# Patient Record
Sex: Female | Born: 1955
Health system: Southern US, Community
[De-identification: ages and names within clinical notes are randomized; demographics above are authoritative.]

## PROBLEM LIST (undated history)

## (undated) DIAGNOSIS — I10 Essential (primary) hypertension: Secondary | ICD-10-CM

## (undated) DIAGNOSIS — I209 Angina pectoris, unspecified: Secondary | ICD-10-CM

## (undated) DIAGNOSIS — R0600 Dyspnea, unspecified: Secondary | ICD-10-CM

## (undated) DIAGNOSIS — Z87442 Personal history of urinary calculi: Secondary | ICD-10-CM

## (undated) DIAGNOSIS — K219 Gastro-esophageal reflux disease without esophagitis: Secondary | ICD-10-CM

## (undated) DIAGNOSIS — J189 Pneumonia, unspecified organism: Secondary | ICD-10-CM

## (undated) DIAGNOSIS — R51 Headache: Secondary | ICD-10-CM

## (undated) DIAGNOSIS — E782 Mixed hyperlipidemia: Secondary | ICD-10-CM

## (undated) DIAGNOSIS — R0609 Other forms of dyspnea: Secondary | ICD-10-CM

## (undated) DIAGNOSIS — E119 Type 2 diabetes mellitus without complications: Secondary | ICD-10-CM

## (undated) DIAGNOSIS — I2510 Atherosclerotic heart disease of native coronary artery without angina pectoris: Secondary | ICD-10-CM

## (undated) DIAGNOSIS — R519 Headache, unspecified: Secondary | ICD-10-CM

## (undated) DIAGNOSIS — N200 Calculus of kidney: Secondary | ICD-10-CM

## (undated) DIAGNOSIS — E669 Obesity, unspecified: Secondary | ICD-10-CM

## (undated) HISTORY — PX: CARDIAC CATHETERIZATION: SHX172

---

## 1978-12-10 HISTORY — PX: TUBAL LIGATION: SHX77

## 1989-08-10 HISTORY — PX: LITHOTRIPSY: SUR834

## 2005-09-04 ENCOUNTER — Ambulatory Visit: Payer: Self-pay | Admitting: Family Medicine

## 2005-12-10 DIAGNOSIS — E119 Type 2 diabetes mellitus without complications: Secondary | ICD-10-CM

## 2005-12-10 HISTORY — DX: Type 2 diabetes mellitus without complications: E11.9

## 2006-07-23 ENCOUNTER — Ambulatory Visit: Payer: Self-pay | Admitting: Family Medicine

## 2006-09-03 ENCOUNTER — Ambulatory Visit: Payer: Self-pay | Admitting: Family Medicine

## 2006-11-11 ENCOUNTER — Ambulatory Visit: Payer: Self-pay | Admitting: Family Medicine

## 2007-03-05 ENCOUNTER — Ambulatory Visit: Payer: Self-pay | Admitting: Family Medicine

## 2010-12-10 DIAGNOSIS — J189 Pneumonia, unspecified organism: Secondary | ICD-10-CM

## 2010-12-10 HISTORY — PX: SKIN BIOPSY: SHX1

## 2010-12-10 HISTORY — DX: Pneumonia, unspecified organism: J18.9

## 2012-08-10 HISTORY — PX: CORONARY ANGIOPLASTY WITH STENT PLACEMENT: SHX49

## 2012-08-13 ENCOUNTER — Encounter (HOSPITAL_COMMUNITY): Payer: Self-pay

## 2012-08-13 ENCOUNTER — Ambulatory Visit (HOSPITAL_COMMUNITY)
Admission: EM | Admit: 2012-08-13 | Discharge: 2012-08-15 | Disposition: A | Discharge status: Home / Self Care | Payer: BC Managed Care – PPO | Attending: Internal Medicine | Admitting: Internal Medicine

## 2012-08-13 ENCOUNTER — Emergency Department (HOSPITAL_COMMUNITY): Payer: BC Managed Care – PPO

## 2012-08-13 DIAGNOSIS — E785 Hyperlipidemia, unspecified: Secondary | ICD-10-CM | POA: Insufficient documentation

## 2012-08-13 DIAGNOSIS — E1165 Type 2 diabetes mellitus with hyperglycemia: Secondary | ICD-10-CM | POA: Diagnosis present

## 2012-08-13 DIAGNOSIS — I152 Hypertension secondary to endocrine disorders: Secondary | ICD-10-CM | POA: Diagnosis present

## 2012-08-13 DIAGNOSIS — E781 Pure hyperglyceridemia: Secondary | ICD-10-CM

## 2012-08-13 DIAGNOSIS — E119 Type 2 diabetes mellitus without complications: Secondary | ICD-10-CM | POA: Diagnosis present

## 2012-08-13 DIAGNOSIS — E782 Mixed hyperlipidemia: Secondary | ICD-10-CM

## 2012-08-13 DIAGNOSIS — M7989 Other specified soft tissue disorders: Secondary | ICD-10-CM | POA: Insufficient documentation

## 2012-08-13 DIAGNOSIS — N1831 Chronic kidney disease, stage 3a: Secondary | ICD-10-CM | POA: Diagnosis present

## 2012-08-13 DIAGNOSIS — E876 Hypokalemia: Secondary | ICD-10-CM | POA: Insufficient documentation

## 2012-08-13 DIAGNOSIS — E1169 Type 2 diabetes mellitus with other specified complication: Secondary | ICD-10-CM | POA: Diagnosis present

## 2012-08-13 DIAGNOSIS — R079 Chest pain, unspecified: Secondary | ICD-10-CM | POA: Diagnosis present

## 2012-08-13 DIAGNOSIS — Z955 Presence of coronary angioplasty implant and graft: Secondary | ICD-10-CM

## 2012-08-13 DIAGNOSIS — Z6836 Body mass index (BMI) 36.0-36.9, adult: Secondary | ICD-10-CM | POA: Insufficient documentation

## 2012-08-13 DIAGNOSIS — I10 Essential (primary) hypertension: Secondary | ICD-10-CM

## 2012-08-13 DIAGNOSIS — I2510 Atherosclerotic heart disease of native coronary artery without angina pectoris: Principal | ICD-10-CM | POA: Insufficient documentation

## 2012-08-13 DIAGNOSIS — I509 Heart failure, unspecified: Secondary | ICD-10-CM | POA: Insufficient documentation

## 2012-08-13 DIAGNOSIS — IMO0002 Reserved for concepts with insufficient information to code with codable children: Secondary | ICD-10-CM

## 2012-08-13 DIAGNOSIS — Z23 Encounter for immunization: Secondary | ICD-10-CM | POA: Insufficient documentation

## 2012-08-13 DIAGNOSIS — E1122 Type 2 diabetes mellitus with diabetic chronic kidney disease: Secondary | ICD-10-CM | POA: Diagnosis present

## 2012-08-13 DIAGNOSIS — I5031 Acute diastolic (congestive) heart failure: Secondary | ICD-10-CM

## 2012-08-13 DIAGNOSIS — I200 Unstable angina: Secondary | ICD-10-CM

## 2012-08-13 HISTORY — DX: Mixed hyperlipidemia: E78.2

## 2012-08-13 HISTORY — DX: Essential (primary) hypertension: I10

## 2012-08-13 HISTORY — DX: Obesity, unspecified: E66.9

## 2012-08-13 LAB — COMPREHENSIVE METABOLIC PANEL
Alkaline Phosphatase: 81 U/L (ref 39–117)
BUN: 13 mg/dL (ref 6–23)
CO2: 28 mEq/L (ref 19–32)
Chloride: 98 mEq/L (ref 96–112)
GFR calc Af Amer: >90 mL/min (ref >90)
Glucose, Bld: 199 mg/dL — ABNORMAL HIGH (ref 70–99)
Potassium: 3.5 mEq/L (ref 3.5–5.1)
Total Bilirubin: 0.3 mg/dL (ref 0.3–1.2)

## 2012-08-13 LAB — POCT I-STAT TROPONIN I: Troponin i, poc: 0 ng/mL (ref 0.00–0.08)

## 2012-08-13 LAB — TROPONIN I: Troponin I: <0.3 ng/mL (ref <0.30)

## 2012-08-13 LAB — CBC WITH DIFFERENTIAL/PLATELET
Hemoglobin: 14.4 g/dL (ref 12.0–15.0)
Lymphs Abs: 2.7 10*3/uL (ref 0.7–4.0)
MCH: 31.6 pg (ref 26.0–34.0)
Monocytes Relative: 8 % (ref 3–12)
Neutro Abs: 4.8 10*3/uL (ref 1.7–7.7)
Neutrophils Relative %: 56 % (ref 43–77)
RBC: 4.55 MIL/uL (ref 3.87–5.11)

## 2012-08-13 LAB — PROTIME-INR: Prothrombin Time: 12.2 seconds (ref 11.6–15.2)

## 2012-08-13 IMAGING — CR DG CHEST 2V
2 series · 2 of 2 positions shown · non-contrast
Comparison: None.

CLINICAL DATA: Chest pain, respiratory distress

CHEST - 2 VIEW

[w chest pa]
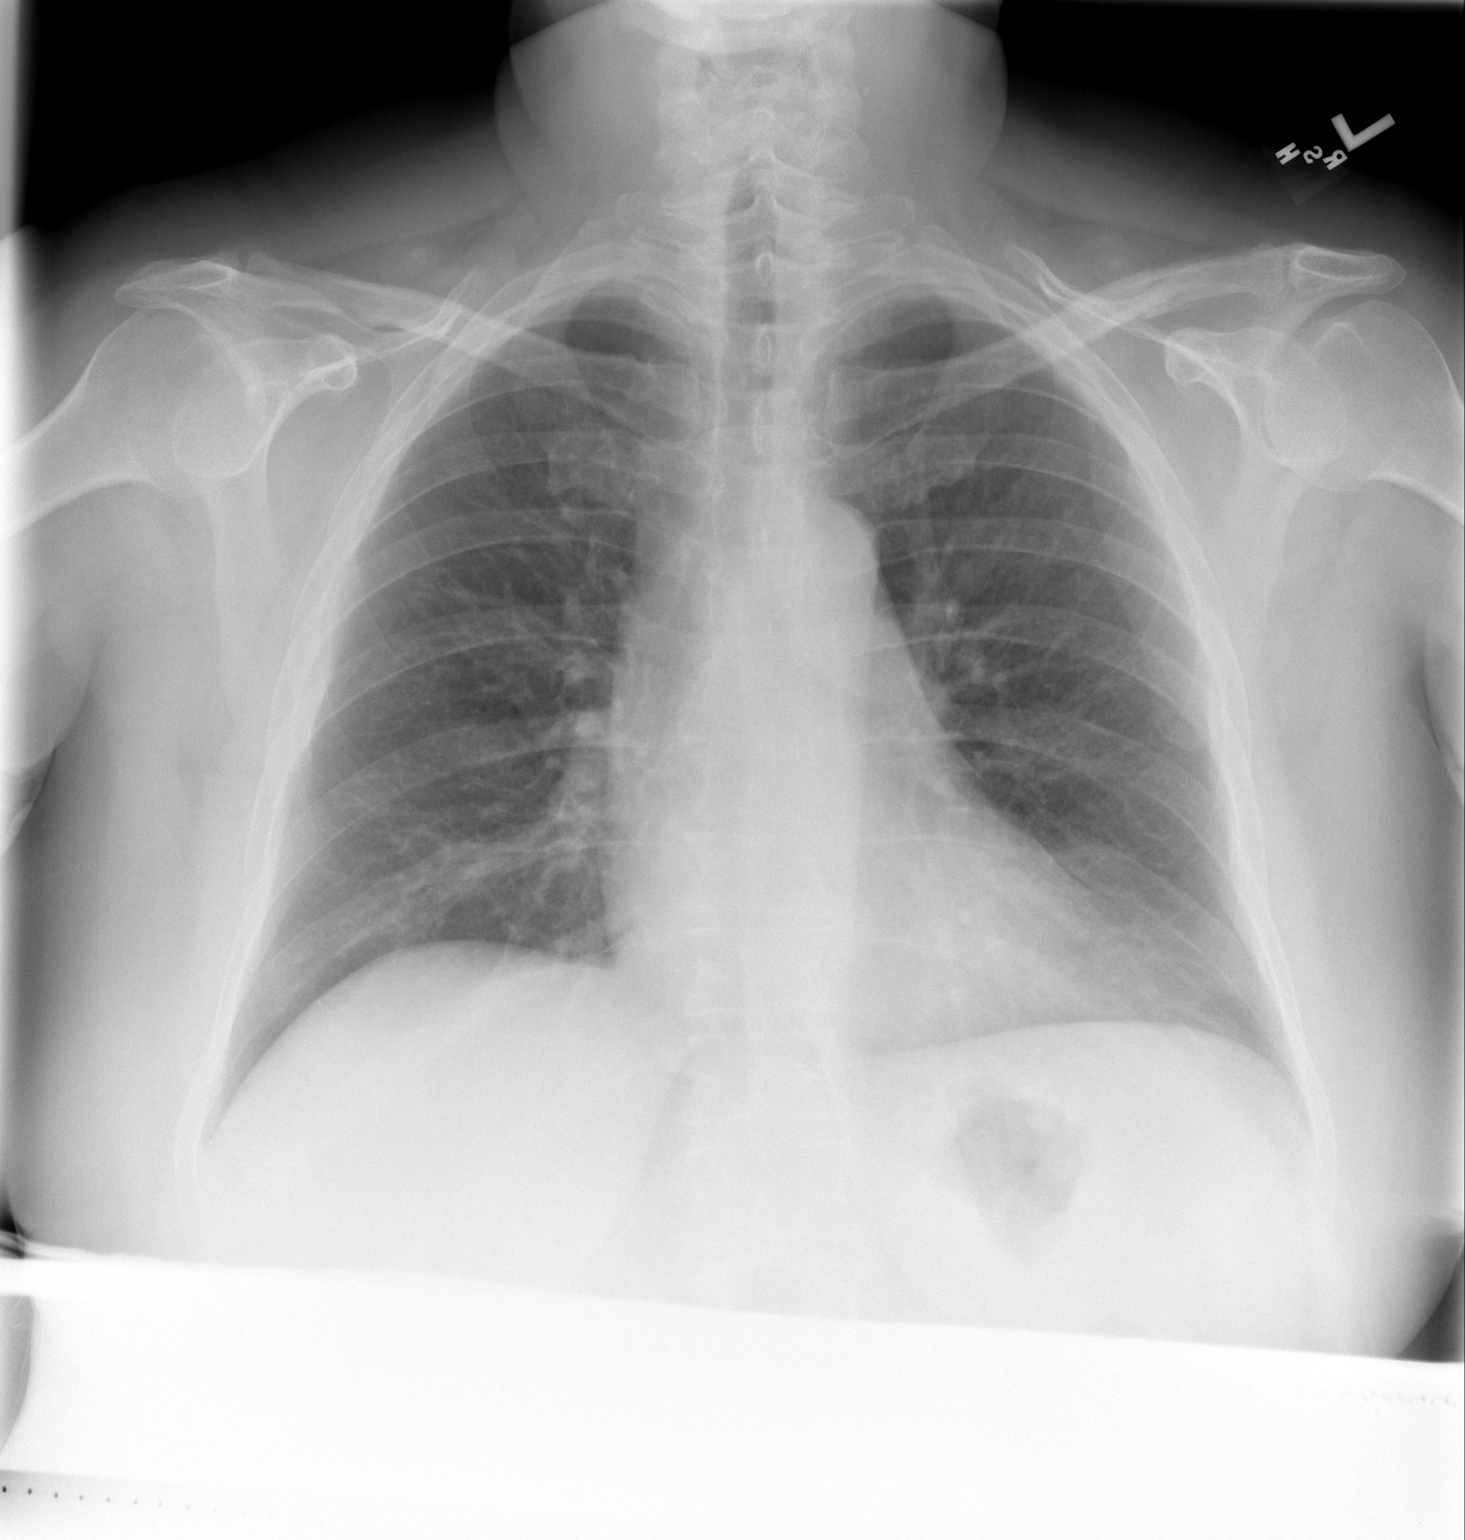

[w chest lat]
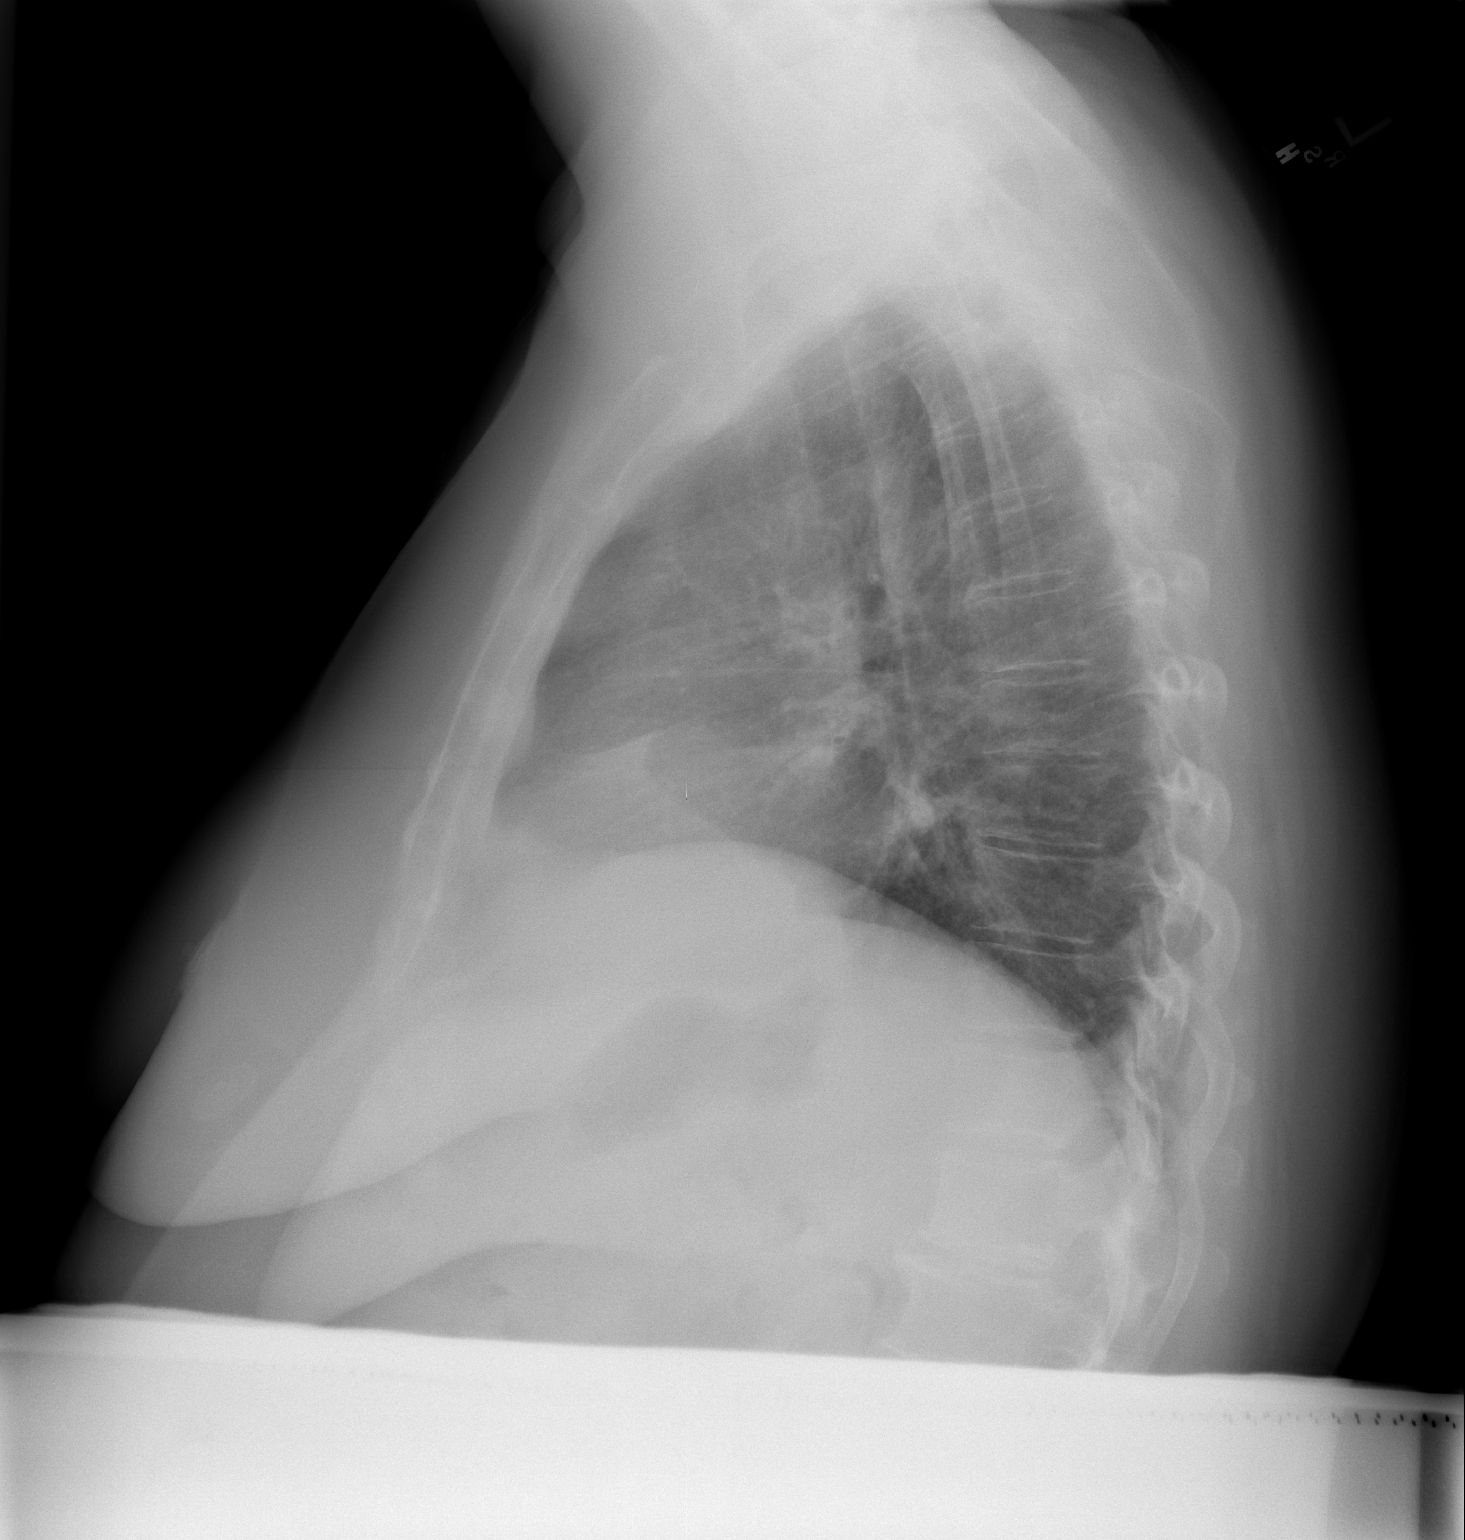

[2 of 2 positions shown; findings below may reference images not displayed]

FINDINGS: The lungs are clear.  Mediastinal contours appear normal.
The heart is within upper limits of normal.  No bony abnormality is
seen.
IMPRESSION: No active lung disease.

## 2012-08-13 MED ORDER — HYDROCHLOROTHIAZIDE 25 MG PO TABS
25.0000 mg | ORAL_TABLET | Freq: Every day | ORAL | Status: DC
  Administered 2012-08-13 – 2012-08-15 (×3): 25 mg via ORAL
  Filled 2012-08-13 (×3): qty 1

## 2012-08-13 MED ORDER — METOPROLOL TARTRATE 50 MG PO TABS
50.0000 mg | ORAL_TABLET | Freq: Two times a day (BID) | ORAL | Status: DC
  Administered 2012-08-13 – 2012-08-15 (×4): 50 mg via ORAL
  Filled 2012-08-13 (×6): qty 1

## 2012-08-13 MED ORDER — LOSARTAN POTASSIUM-HCTZ 100-25 MG PO TABS: 1.0000 | ORAL_TABLET | Freq: Every evening | ORAL | Status: DC

## 2012-08-13 MED ORDER — SODIUM CHLORIDE 0.9 % IJ SOLN
3.0000 mL | Freq: Two times a day (BID) | INTRAMUSCULAR | Status: DC
  Administered 2012-08-13 – 2012-08-14 (×2): 3 mL via INTRAVENOUS

## 2012-08-13 MED ORDER — NITROGLYCERIN 0.4 MG SL SUBL: 0.4000 mg | SUBLINGUAL_TABLET | SUBLINGUAL | Status: DC | PRN

## 2012-08-13 MED ORDER — ATORVASTATIN CALCIUM 40 MG PO TABS
40.0000 mg | ORAL_TABLET | Freq: Every day | ORAL | Status: DC
  Administered 2012-08-13 – 2012-08-14 (×2): 40 mg via ORAL
  Filled 2012-08-13 (×6): qty 1

## 2012-08-13 MED ORDER — SODIUM CHLORIDE 0.9 % IV SOLN
Freq: Once | INTRAVENOUS | Status: AC
Start: 2012-08-13 — End: 2012-08-13
  Administered 2012-08-13: 20:00:00 via INTRAVENOUS

## 2012-08-13 MED ORDER — ASPIRIN 81 MG PO CHEW
324.0000 mg | CHEWABLE_TABLET | Freq: Once | ORAL | Status: AC
  Administered 2012-08-13: 324 mg via ORAL
  Filled 2012-08-13: qty 4

## 2012-08-13 MED ORDER — FUROSEMIDE 10 MG/ML IJ SOLN
40.0000 mg | Freq: Once | INTRAMUSCULAR | Status: AC
  Administered 2012-08-13: 40 mg via INTRAVENOUS
  Filled 2012-08-13: qty 4

## 2012-08-13 MED ORDER — NITROGLYCERIN 0.4 MG SL SUBL
0.4000 mg | SUBLINGUAL_TABLET | Freq: Once | SUBLINGUAL | Status: AC
  Administered 2012-08-13: 0.4 mg via SUBLINGUAL
  Filled 2012-08-13: qty 25

## 2012-08-13 MED ORDER — ENOXAPARIN SODIUM 100 MG/ML ~~LOC~~ SOLN
90.0000 mg | Freq: Once | SUBCUTANEOUS | Status: AC
  Administered 2012-08-13: 90 mg via SUBCUTANEOUS
  Filled 2012-08-13: qty 1

## 2012-08-13 MED ORDER — ACETAMINOPHEN 325 MG PO TABS
650.0000 mg | ORAL_TABLET | ORAL | Status: DC | PRN
  Administered 2012-08-13 – 2012-08-15 (×2): 650 mg via ORAL
  Filled 2012-08-13 (×2): qty 2

## 2012-08-13 MED ORDER — SODIUM CHLORIDE 0.9 % IV SOLN: 250.0000 mL | INTRAVENOUS | Status: DC | PRN

## 2012-08-13 MED ORDER — SODIUM CHLORIDE 0.9 % IJ SOLN: 3.0000 mL | INTRAMUSCULAR | Status: DC | PRN

## 2012-08-13 MED ORDER — ONDANSETRON HCL 4 MG/2ML IJ SOLN
4.0000 mg | Freq: Four times a day (QID) | INTRAMUSCULAR | Status: DC | PRN
  Administered 2012-08-13: 4 mg via INTRAVENOUS
  Filled 2012-08-13: qty 2

## 2012-08-13 MED ORDER — LOSARTAN POTASSIUM 50 MG PO TABS
100.0000 mg | ORAL_TABLET | Freq: Every day | ORAL | Status: DC
  Administered 2012-08-13 – 2012-08-15 (×3): 100 mg via ORAL
  Filled 2012-08-13 (×3): qty 2

## 2012-08-13 MED ORDER — ASPIRIN EC 81 MG PO TBEC
81.0000 mg | DELAYED_RELEASE_TABLET | Freq: Every day | ORAL | Status: DC
  Administered 2012-08-14: 81 mg via ORAL
  Filled 2012-08-13 (×2): qty 1

## 2012-08-13 MED ORDER — NITROGLYCERIN 2 % TD OINT
0.5000 [in_us] | TOPICAL_OINTMENT | Freq: Once | TRANSDERMAL | Status: AC
  Administered 2012-08-13: 0.5 [in_us] via TOPICAL
  Filled 2012-08-13: qty 1

## 2012-08-13 MED ORDER — GI COCKTAIL ~~LOC~~
30.0000 mL | Freq: Once | ORAL | Status: AC
  Administered 2012-08-13: 30 mL via ORAL
  Filled 2012-08-13: qty 30

## 2012-08-13 MED ORDER — MORPHINE SULFATE 2 MG/ML IJ SOLN: 2.0000 mg | INTRAMUSCULAR | Status: DC | PRN

## 2012-08-13 MED ORDER — INSULIN ASPART 100 UNIT/ML ~~LOC~~ SOLN
0.0000 [IU] | Freq: Three times a day (TID) | SUBCUTANEOUS | Status: DC
  Administered 2012-08-14 – 2012-08-15 (×3): 2 [IU] via SUBCUTANEOUS

## 2012-08-13 MED ORDER — MORPHINE SULFATE 2 MG/ML IJ SOLN
2.0000 mg | Freq: Once | INTRAMUSCULAR | Status: AC
  Administered 2012-08-13: 2 mg via INTRAMUSCULAR
  Filled 2012-08-13: qty 1

## 2012-08-13 MED ORDER — ASPIRIN 81 MG PO CHEW
324.0000 mg | CHEWABLE_TABLET | ORAL | Status: AC
  Administered 2012-08-14: 324 mg via ORAL
  Filled 2012-08-13: qty 4

## 2012-08-13 MED ORDER — SODIUM CHLORIDE 0.9 % IV SOLN: INTRAVENOUS | Status: DC

## 2012-08-13 NOTE — ED Notes (Signed)
Pt complains of chest pain and difficulty breathign, onset several days ago, pt sts that she has had obligations at home and this is the first time she could come here.

## 2012-08-13 NOTE — Progress Notes (Signed)
ANTICOAGULATION CONSULT NOTE - Initial Consult  Pharmacy Consult for Lovenox Indication: chest pain/ACS  Allergies  Allergen Reactions  . Codeine Nausea And Vomiting    But has tolerated morphine    Patient Measurements: Height: 5\' 2"  (157.5 cm) Weight: 196 lb (88.905 kg) IBW/kg (Calculated) : 50.1   Vital Signs: Temp: 97.5 F (36.4 C) (09/04 1840) Temp src: Oral (09/04 1840) BP: 143/67 mmHg (09/04 1840) Pulse Rate: 72  (09/04 1840)  Labs:  Basename 08/13/12 1229  HGB 14.4  HCT 42.2  PLT 358  APTT --  LABPROT --  INR --  HEPARINUNFRC --  CREATININE 0.68  CKTOTAL --  CKMB --  TROPONINI --    Estimated Creatinine Clearance: 81.3 ml/min (by C-G formula based on Cr of 0.68).   Medical History: Past Medical History  Diagnosis Date  . Diabetes mellitus     Since age 51  . Hypertension   . Tobacco abuse     Smoked for 40 years    Medications:  See electronic Med Rec  Assessment: 55yof admitted for CP to start therapeutic Lovenox for ACS/USA. Patient reports no current bleeding and not taking any anticoagulants. Noted plans for cath tomorrow and orders to hold AM Lovenox dose - will order only 1 dose tonight and follow-up post-cath orders. - H/H and Plts wnl - Wt: 89kg - SCr 0.68  Goal of Therapy:  Anti-Xa level 0.6-1.2 units/ml 4hrs after LMWH dose given Monitor platelets by anticoagulation protocol: Yes   Plan:  1. Lovenox 90mg  (~1mg /kg) SQ x 1 dose tonight - per MD order, hold AM dose 2. Follow-up post cath orders for Lovenox continuation 3. CBC q72h if Lovenox continued  Cleon Dew [phone] 08/13/2012,7:39 PM

## 2012-08-13 NOTE — ED Provider Notes (Signed)
History     CSN: [id number]  Arrival date & time 08/13/12  1212   First MD Initiated Contact with Patient 08/13/12 1623      Chief Complaint  Patient presents with  . Chest Pain  . Respiratory Distress    (Consider location/radiation/quality/duration/timing/severity/associated sxs/prior treatment) HPI Amanda Mendez is a 56 y.o. female with a past medical history significant for hypercholesterolemia, hypertension, a long smoking history, obesity, diabetes mellitus but no known cardiac coronary disease presents with a month of off and on intermittent chest pain is over the last 3 days got worse. Patient comes to the emergency department today because the chest pain became constant it is located in the center of her chest, it is squeezing "like somebody sitting on my chest", worse on exertion, it does not radiate is not associated with nausea vomiting or diaphoresis. She's also noticed lower extremity edema over the last couple of days. Patient is also noticed an increase in dyspnea the last couple of days and dyspnea on exertion especially today. Patient does admit to eating very salty foods at the beginning of the week Monday she ate some salty ribs and then Tuesday and Wednesday ate [employer]. Her husband recently had heart surgery so she weighs herself with her husband daily he noticed a 6 pound weight gain since yesterday.  Amanda Mendez is followed by primary care physician Amanda Mendez.  Past Medical History  Diagnosis Date  . Diabetes mellitus   . Hypertension     No past surgical history on file.  No family history on file.  History  Substance Use Topics  . Smoking status: Former Games developer  . Smokeless tobacco: Not on file  . Alcohol Use: No    OB History    Grav Para Term Preterm Abortions TAB SAB Ect Mult Living                  Review of Systems positive for chest pain, dyspnea on exertion, dyspnea, cough, lower extremity edema; Patient denies any fevers or chills,  changes in vision, earache, sore throat, neck pain or stiffness, palpitations, syncope, wheezing, abdominal pain, nausea, vomiting, diarrhea, melena, red bloody stools, frequency, dysuria, myalgias, arthralgias, back pain, recent trauma, rash, itching, skin lesions, easy bruising or bleeding, headache, seizures, numbness, tingling or weakness.   Allergies  Review of patient's allergies indicates no known allergies.  Home Medications   Current Outpatient Rx  Name Route Sig Dispense Refill  . LOSARTAN POTASSIUM-HCTZ 100-25 MG PO TABS Oral Take 1 tablet by mouth every evening.    Amanda Mendez Kitchen METFORMIN HCL 500 MG PO TABS Oral Take 500 mg by mouth 2 (two) times daily with a meal.    . METOPROLOL TARTRATE 50 MG PO TABS Oral Take 50 mg by mouth 2 (two) times daily.      BP 151/94  Pulse 77  Temp 97.9 F (36.6 C) (Oral)  Resp 16  SpO2 95%  Physical Exam VITAL SIGNS:   Filed Vitals:   08/13/12 1641  BP: 155/79  Pulse: 74  Temp: 97.6 F (36.4 C)  Resp: 19   CONSTITUTIONAL: Awake, oriented, appears non-toxic. Upright in the bed, central adiposity. Plethoric complexion HENT: Atraumatic, normocephalic, oral mucosa pink and moist, airway patent. Nares patent without drainage. External ears normal. EYES: Conjunctiva clear, EOMI, PERRLA NECK: Trachea midline, non-tender, supple CARDIOVASCULAR: Normal heart rate, Normal rhythm, systolic ejection murmur heard best at the right upper sternal border PULMONARY/CHEST: Scant rales in the bases. No wheezes or rhonchi,  no consolidation. Fair air movement Symmetrical breath sounds. Non-tender. ABDOMINAL: Obese, Non-distended, soft, tenderness in the epigastrium, o rebound or guarding.  BS normal. NEUROLOGIC: Non-focal, moving all four extremities, no gross sensory or motor deficits. EXTREMITIES: No cyanosis. 2+ lower extremity edema SKIN: Warm, Dry, No erythema, No rash  ED Course  Procedures (including critical care time)  Labs Reviewed  COMPREHENSIVE  METABOLIC PANEL - Abnormal; Notable for the following:    Glucose, Bld 199 (*)     ALT 47 (*)     All other components within normal limits  CBC WITH DIFFERENTIAL  POCT I-STAT TROPONIN I   Dg Chest 2 View  08/13/2012  *RADIOLOGY REPORT*  Clinical Data: Chest pain, respiratory distress  CHEST - 2 VIEW  Comparison: None.  Findings: The lungs are clear.  Mediastinal contours appear normal. The heart is within upper limits of normal.  No bony abnormality is seen.  IMPRESSION: No active lung disease.   Original Report Authenticated By: Juline Patch, M.D.    No diagnosis found.  MDM  Koda Modesitt is a 56 y.o. female who presents with a very concerning history for acute coronary syndrome is worsening over the course of the last month. Also sounds that she is component of failure with the recent lower extremity edema is possibly exacerbated by some indiscretions in her diet.  She'll EKG shows a normal sinus rhythm, Q wave in V1 to V3, there is no prior EKG to compare with, normal intervals, normal axis no other ST or T wave abnormalities to suggest acute injury or infarction.  Initial cardiac biomarkers are negative. BNP WNL.  Pt has a concerning history for ACS.  Discussed with [facility] cardiology for admission for further testing  Jones Skene, M.D.          Jones Skene, MD 08/14/12 1328

## 2012-08-13 NOTE — H&P (Signed)
History and Physical  Patient ID: Amanda Mendez MRN: [id number], DOB: [birth year] Date of Encounter: 08/13/2012, 7:01 PM Primary Physician: No primary provider on file. Primary Cardiologist: New to [place]  Chief Complaint: chest pain, leg swelling  HPI: Amanda Mendez is a 56 y/o F with no prior cardiac hx but a hx of DM, HTN, prior tobacco abuse who presented to [facility] with complaints of chest pain and leg swelling. For the last month she has noted intermittent chest pressure that radiates to both arms and across her chest. This tends to come on when she is eating, "rushing around," or exerting herself. It is relieved with rest. She has also noted dyspnea with activity such as walking up steps which is new for her. Today, while her husband was being seen in our office by Amanda Mendez, the patient herself mentioned a worsening episode of chest pain and was thus sent to the ER for further evaluation. She got 324mg  of ASA, GI cocktail and NTG paste. She believes the NTG paste brought the pain down. She also reports increasing LEE with 12 lb weight gain over the last month, 6 of which occurred overnight. She is having trouble putting her shoes on. She still endorses some discomfort "coming back." VSS except somewhat hypertensive. Troponin neg x 1 and BNP WNL. CXR shows heart within upper limits of normal. EKG demonstrates NSR with possible anterior infarct age undetermined (although may be lead placement) with lateral T wave flattening. No prior for comparison.   Past Medical History  Diagnosis Date  . Diabetes mellitus     Since age 53  . Hypertension   . Tobacco abuse     Smoked for 40 years     Most Recent Cardiac Studies: None   Surgical History:  Past Surgical History  Procedure Date  . Tubal ligation      Home Meds: Prior to Admission medications   Medication Sig Start Date End Date Taking? Authorizing Provider  losartan-hydrochlorothiazide (HYZAAR) 100-25 MG per tablet  Take 1 tablet by mouth every evening.   Yes Historical Provider, MD  metFORMIN (GLUCOPHAGE) 500 MG tablet Take 500 mg by mouth 2 (two) times daily with a meal.   Yes Historical Provider, MD  metoprolol (LOPRESSOR) 50 MG tablet Take 50 mg by mouth 2 (two) times daily.   Yes Historical Provider, MD    Allergies: No Known Allergies  History   Social History  . Marital Status: Married    Spouse Name: N/A    Number of Children: N/A  . Years of Education: N/A   Occupational History  . Not on file.   Social History Main Topics  . Smoking status: Former Games developer  . Smokeless tobacco: Not on file   Comment: Smoked for 40 years. Quit 06/2012  . Alcohol Use: No  . Drug Use: No  . Sexually Active: Not on file   Other Topics Concern  . Not on file   Social History Narrative  . No narrative on file     Family History  Problem Relation Age of Onset  . Heart failure Mother     Died in her 90s, had angina starting in her 41s  . Crohn's disease Brother     Review of Systems: General: negative for chills, fever, night sweats Cardiovascular: see above Dermatological: negative for rash Respiratory: negative for cough or wheezing Urologic: negative for hematuria Abdominal: negative for nausea, vomiting, diarrhea, bright red blood per rectum, melena, or hematemesis Neurologic: negative  for visual changes, syncope, or dizziness All other systems reviewed and are otherwise negative except as noted above.  Labs:   Lab Results  Component Value Date   WBC 8.6 08/13/2012   HGB 14.4 08/13/2012   HCT 42.2 08/13/2012   MCV 92.7 08/13/2012   PLT 358 08/13/2012    Lab 08/13/12 1229  NA 139  K 3.5  CL 98  CO2 28  BUN 13  CREATININE 0.68  CALCIUM 10.1  PROT 7.7  BILITOT 0.3  ALKPHOS 81  ALT 47*  AST 31  GLUCOSE 199*    Radiology/Studies:  Dg Chest 2 View 08/13/2012  *RADIOLOGY REPORT*  Clinical Data: Chest pain, respiratory distress  CHEST - 2 VIEW  Comparison: None.  Findings: The lungs  are clear.  Mediastinal contours appear normal. The heart is within upper limits of normal.  No bony abnormality is seen.  IMPRESSION: No active lung disease.   Original Report Authenticated By: Juline Patch, M.D.      EKG: NSR with possible anterior infarct age undetermined with lateral T wave flattening (I, avL, V6 -- slight inversion in avL)  Physical Exam: Blood pressure 143/67, pulse 72, temperature 97.5 F (36.4 C), temperature source Oral, resp. rate 22, SpO2 95.00%. General: Well developed, well nourished overweight WF in no acute distress. Head: Normocephalic, atraumatic, sclera non-icteric, no xanthomas, nares are without discharge.  Neck: JVD not elevated. Lungs: Distant lung sounds. Crackles in the bases. No wheezes or rhonchi. Breathing is unlabored. Heart: RRR with S1 S2. No murmurs, rubs, or gallops appreciated. Abdomen: Soft, non-tender, non-distended with normoactive bowel sounds. No hepatomegaly. No rebound/guarding. No obvious abdominal masses. Msk:  Strength and tone appear normal for age. Extremities: No clubbing or cyanosis. 1+ pitting LE edema bilaterally. On first glance it is not that impressive but with palpation she has significant pitting.  Distal pedal pulses are 2+ and equal bilaterally. Neuro: Alert and oriented X 3. Moves all extremities spontaneously. Psych:  Responds to questions appropriately with a normal affect.    ASSESSMENT AND PLAN:   1. Unstable angina - worrisome for ACS. Cycle cardiac enzymes. Add full dose Lovenox, ASA. Continue BB. Check lipid panel. See below for additional thoughts. 2. Acute congestive heart failure, type unknown - felt likely diastolic - will proceed with R & L heart cath in AM with LV assessment. Diurese as below. 3. HTN - continue NTG patch and home antihypertensives, watch pressure on current measures. 4. Diabetes mellitus - add CBG checks, SSI. Hold metformin. 5. Tobacco abuse - congratulated on cessation, encouraged  continued abstinence. 6. Obesity, morbid  Signed, Ronie Spies PA-C 08/13/2012, 7:01 PM  Patient seen and examined with Ronie Spies, PA-C. We discussed all aspects of the encounter. I agree with the assessment and plan as stated above.   CP concerning for [place] particularly given risk factors.  However no objective findings of ischemia at this point. Also appears to have some volume overload but CXR and BNP are normal.  Will admit. Cycle markers. Treat with lovenox, asa, NTG, statin, BB and 1 dose IV lasix. Plan R & L cath in am.   Truman Hayward 7:32 PM

## 2012-08-13 NOTE — ED Notes (Signed)
MD in pt room

## 2012-08-13 NOTE — ED Notes (Signed)
MD at bedside. 

## 2012-08-14 ENCOUNTER — Encounter (HOSPITAL_COMMUNITY)
Admission: EM | Disposition: A | Discharge status: Home / Self Care | Payer: Self-pay | Source: Home / Self Care | Attending: Internal Medicine

## 2012-08-14 ENCOUNTER — Ambulatory Visit (HOSPITAL_COMMUNITY): Admit: 2012-08-14 | Payer: Self-pay | Admitting: Cardiovascular Disease

## 2012-08-14 ENCOUNTER — Encounter (HOSPITAL_COMMUNITY): Payer: Self-pay | Admitting: *Deleted

## 2012-08-14 DIAGNOSIS — I2510 Atherosclerotic heart disease of native coronary artery without angina pectoris: Secondary | ICD-10-CM

## 2012-08-14 DIAGNOSIS — E119 Type 2 diabetes mellitus without complications: Secondary | ICD-10-CM | POA: Diagnosis present

## 2012-08-14 DIAGNOSIS — E1169 Type 2 diabetes mellitus with other specified complication: Secondary | ICD-10-CM | POA: Diagnosis present

## 2012-08-14 DIAGNOSIS — I152 Hypertension secondary to endocrine disorders: Secondary | ICD-10-CM | POA: Diagnosis present

## 2012-08-14 DIAGNOSIS — E1122 Type 2 diabetes mellitus with diabetic chronic kidney disease: Secondary | ICD-10-CM | POA: Diagnosis present

## 2012-08-14 DIAGNOSIS — E1165 Type 2 diabetes mellitus with hyperglycemia: Secondary | ICD-10-CM | POA: Diagnosis present

## 2012-08-14 DIAGNOSIS — N1831 Chronic kidney disease, stage 3a: Secondary | ICD-10-CM | POA: Diagnosis present

## 2012-08-14 DIAGNOSIS — I10 Essential (primary) hypertension: Secondary | ICD-10-CM | POA: Diagnosis present

## 2012-08-14 HISTORY — PX: LEFT AND RIGHT HEART CATHETERIZATION WITH CORONARY ANGIOGRAM: SHX5449

## 2012-08-14 HISTORY — PX: PERCUTANEOUS CORONARY STENT INTERVENTION (PCI-S): SHX5485

## 2012-08-14 LAB — LIPID PANEL
LDL Cholesterol: UNDETERMINED mg/dL (ref 0–99)
Total CHOL/HDL Ratio: 6.4 RATIO
Triglycerides: 557 mg/dL — ABNORMAL HIGH (ref <150)
VLDL: UNDETERMINED mg/dL (ref 0–40)

## 2012-08-14 LAB — BASIC METABOLIC PANEL
BUN: 15 mg/dL (ref 6–23)
CO2: 27 mEq/L (ref 19–32)
Calcium: 9.9 mg/dL (ref 8.4–10.5)
Creatinine, Ser: 0.72 mg/dL (ref 0.50–1.10)

## 2012-08-14 LAB — GLUCOSE, CAPILLARY
Glucose-Capillary: 163 mg/dL — ABNORMAL HIGH (ref 70–99)
Glucose-Capillary: 305 mg/dL — ABNORMAL HIGH (ref 70–99)
Glucose-Capillary: 261 mg/dL — ABNORMAL HIGH (ref 70–99)

## 2012-08-14 LAB — HEPATIC FUNCTION PANEL
Alkaline Phosphatase: 76 U/L (ref 39–117)
Bilirubin, Direct: <0.1 mg/dL (ref 0.0–0.3)
Total Bilirubin: 0.4 mg/dL (ref 0.3–1.2)
Total Protein: 6.7 g/dL (ref 6.0–8.3)

## 2012-08-14 LAB — CBC
MCH: 31.7 pg (ref 26.0–34.0)
MCV: 92.9 fL (ref 78.0–100.0)
Platelets: 343 10*3/uL (ref 150–400)
RBC: 4.1 MIL/uL (ref 3.87–5.11)
RDW: 12.5 % (ref 11.5–15.5)

## 2012-08-14 LAB — TROPONIN I: Troponin I: <0.3 ng/mL (ref <0.30)

## 2012-08-14 LAB — HEMOGLOBIN A1C: Hgb A1c MFr Bld: 9.3 % — ABNORMAL HIGH (ref <5.7)

## 2012-08-14 SURGERY — LEFT AND RIGHT HEART CATHETERIZATION WITH CORONARY ANGIOGRAM
Anesthesia: LOCAL

## 2012-08-14 MED ORDER — SODIUM CHLORIDE 0.9 % IV SOLN
1.0000 mL/kg/h | INTRAVENOUS | Status: AC
  Administered 2012-08-14: 1 mL/kg/h via INTRAVENOUS

## 2012-08-14 MED ORDER — OXYCODONE-ACETAMINOPHEN 5-325 MG PO TABS
1.0000 | ORAL_TABLET | ORAL | Status: DC | PRN
  Administered 2012-08-14 (×2): 1 via ORAL
  Filled 2012-08-14 (×2): qty 1

## 2012-08-14 MED ORDER — NITROGLYCERIN 0.2 MG/ML ON CALL CATH LAB
INTRAVENOUS | Status: AC
  Filled 2012-08-14: qty 1

## 2012-08-14 MED ORDER — LIDOCAINE HCL (PF) 1 % IJ SOLN
INTRAMUSCULAR | Status: AC
  Filled 2012-08-14: qty 30

## 2012-08-14 MED ORDER — SODIUM CHLORIDE 0.9 % IV SOLN: INTRAVENOUS | Status: DC

## 2012-08-14 MED ORDER — SODIUM CHLORIDE 0.9 % IV SOLN: 250.0000 mL | INTRAVENOUS | Status: DC

## 2012-08-14 MED ORDER — HEPARIN (PORCINE) IN NACL 2-0.9 UNIT/ML-% IJ SOLN
INTRAMUSCULAR | Status: AC
  Filled 2012-08-14: qty 2000

## 2012-08-14 MED ORDER — SODIUM CHLORIDE 0.9 % IJ SOLN: 3.0000 mL | Freq: Two times a day (BID) | INTRAMUSCULAR | Status: DC

## 2012-08-14 MED ORDER — MIDAZOLAM HCL 2 MG/2ML IJ SOLN
INTRAMUSCULAR | Status: AC
  Filled 2012-08-14: qty 2

## 2012-08-14 MED ORDER — HEPARIN (PORCINE) IN NACL 100-0.45 UNIT/ML-% IJ SOLN
950.0000 [IU]/h | INTRAMUSCULAR | Status: DC
  Filled 2012-08-14: qty 250

## 2012-08-14 MED ORDER — FAMOTIDINE IN NACL 20-0.9 MG/50ML-% IV SOLN
INTRAVENOUS | Status: AC
  Filled 2012-08-14: qty 50

## 2012-08-14 MED ORDER — POTASSIUM CHLORIDE CRYS ER 20 MEQ PO TBCR
40.0000 meq | EXTENDED_RELEASE_TABLET | Freq: Once | ORAL | Status: AC
  Administered 2012-08-14: 40 meq via ORAL
  Filled 2012-08-14: qty 2

## 2012-08-14 MED ORDER — PNEUMOCOCCAL VAC POLYVALENT 25 MCG/0.5ML IJ INJ
0.5000 mL | INJECTION | INTRAMUSCULAR | Status: AC
  Administered 2012-08-15: 0.5 mL via INTRAMUSCULAR
  Filled 2012-08-14: qty 0.5

## 2012-08-14 MED ORDER — ACETAMINOPHEN 325 MG PO TABS: 650.0000 mg | ORAL_TABLET | ORAL | Status: DC | PRN

## 2012-08-14 MED ORDER — BIVALIRUDIN 250 MG IV SOLR
INTRAVENOUS | Status: AC
  Filled 2012-08-14: qty 250

## 2012-08-14 MED ORDER — ASPIRIN 81 MG PO CHEW
81.0000 mg | CHEWABLE_TABLET | Freq: Every day | ORAL | Status: DC
  Administered 2012-08-15: 81 mg via ORAL
  Filled 2012-08-14: qty 1

## 2012-08-14 MED ORDER — ONDANSETRON HCL 4 MG/2ML IJ SOLN: 4.0000 mg | Freq: Four times a day (QID) | INTRAMUSCULAR | Status: DC | PRN

## 2012-08-14 MED ORDER — FENTANYL CITRATE 0.05 MG/ML IJ SOLN
INTRAMUSCULAR | Status: AC
  Filled 2012-08-14: qty 2

## 2012-08-14 MED ORDER — NITROGLYCERIN 2 % TD OINT: 0.5000 [in_us] | TOPICAL_OINTMENT | Freq: Four times a day (QID) | TRANSDERMAL | Status: DC | PRN

## 2012-08-14 MED ORDER — CLOPIDOGREL BISULFATE 75 MG PO TABS
75.0000 mg | ORAL_TABLET | Freq: Every day | ORAL | Status: DC
  Administered 2012-08-15: 08:00:00 75 mg via ORAL
  Filled 2012-08-14: qty 1

## 2012-08-14 MED ORDER — HEPARIN (PORCINE) IN NACL 2-0.9 UNIT/ML-% IJ SOLN
INTRAMUSCULAR | Status: AC
Start: 2012-08-14 — End: 2012-08-14
  Filled 2012-08-14: qty 2000

## 2012-08-14 MED ORDER — CLOPIDOGREL BISULFATE 300 MG PO TABS
ORAL_TABLET | ORAL | Status: AC
  Filled 2012-08-14: qty 2

## 2012-08-14 MED ORDER — FAMOTIDINE IN NACL 20-0.9 MG/50ML-% IV SOLN
20.0000 mg | Freq: Once | INTRAVENOUS | Status: AC
  Administered 2012-08-14: 20 mg via INTRAVENOUS

## 2012-08-14 MED ORDER — HEPARIN SODIUM (PORCINE) 1000 UNIT/ML IJ SOLN
INTRAMUSCULAR | Status: AC
  Filled 2012-08-14: qty 1

## 2012-08-14 MED ORDER — SODIUM CHLORIDE 0.9 % IJ SOLN: 3.0000 mL | INTRAMUSCULAR | Status: DC | PRN

## 2012-08-14 NOTE — Interval H&P Note (Signed)
History and Physical Interval Note:  08/14/2012 1:18 PM  Akansha Kalil  has presented today for surgery, with the diagnosis of cp  The various methods of treatment have been discussed with the patient and family. After consideration of risks, benefits and other options for treatment, the patient has consented to  Procedure(s) (LRB) with comments: LEFT AND RIGHT HEART CATHETERIZATION WITH CORONARY ANGIOGRAM (N/A) as a surgical intervention .  The patient's history has been reviewed, patient examined, no change in status, stable for surgery.  I have reviewed the patient's chart and labs.  Questions were answered to the patient's satisfaction.   Diagnostic cath films reviewed. Pt has unstable angina and severe OM stenosis. Plan PCI. Discussed with patient.   Tonny Bollman

## 2012-08-14 NOTE — Progress Notes (Signed)
Patient admitted with CP.  Having cardiac cath today.  +history of diabetes.  Takes Metformin 500 mg bid at home.  Uncontrolled CBGs at home as evidenced by A1c of 9.3% (08/13/12).  Sees Dr. Lysbeth Galas as an outpatient for medical management.  Likely needs intensification of diabetes medication regimen for home with follow-up with her PCP (Dr. Lysbeth Galas).  Recommend the following changes for discharge:  1. Increase home Metformin to 1000 mg bid (currently takes 500 mg bid) 2. Add a DPP-4 inhibitor to help with postprandial elevations- Januvia 50 mg once daily  Will follow. Ambrose Finland RN, MSN, CDE Diabetes Coordinator Inpatient Diabetes Program [phone]

## 2012-08-14 NOTE — CV Procedure (Signed)
   Cardiac Catheterization Procedure Note  Name: Amanda Mendez MRN: [id number] DOB: December 18, 1955  Procedure: Right Heart Cath, Left Heart Cath, Selective Coronary Angiography, LV angiography  Indication:  Unstable angina  Procedural Details: The right groin was prepped, draped, and anesthetized with 1% lidocaine. Using the modified Seldinger technique a 5 French sheath was placed in the right femoral artery and a 7 French sheath was placed in the right femoral vein. A Swan-Ganz catheter was used for the right heart catheterization. Standard protocol was followed for recording of right heart pressures and sampling of oxygen saturations. Fick cardiac output was calculated. Standard Judkins catheters were used for selective coronary angiography and left ventriculography. There were no immediate procedural complications. The patient was transferred to the post catheterization recovery area for further monitoring.  Procedural Findings:  Hemodynamics:               RA 16    RV 37/12    PA 38/15 26    PCWP 16    LV 153/17    AO 144/84   Oxygen saturations:    PA 56    AO 88   Cardiac Output (Fick) 4.42                               Cardiac Index (Fick) 2.35   Coronary angiography:  Coronary dominance: co dominant  Left mainstem:  Normal  Left anterior descending (LAD):  Large wrapping the apex.    Mid diagonal large with proximal 25%  Left circumflex (LCx): Co dominant.  AV large and normal.  OM large and branching with proximal 80%.  PDA large and normal  Right coronary artery (RCA):   Small to moderate with a high anterior take off.  Mild luminal irregularities.  Mid 60%.  PDA small and normal  Left ventriculography: Left ventricular systolic function is normal, LVEF is estimated at 55-65%, there is no significant mitral regurgitation   Final Conclusions:  Severe OM disease.  Mildly elevated right sided pressures.    Recommendations:  The patient will have PCI of the OM lesion.   She will have medical management of the elevated right sided pressures and further assessment based on future symptoms.     Fayrene Fearing Manuel Dall 08/14/2012, 11:30 AM

## 2012-08-14 NOTE — Progress Notes (Addendum)
ANTICOAGULATION CONSULT NOTE - Initial Consult  Pharmacy Consult for Heparin - not started -  Pt to cath lab Indication: chest pain/ACS  Allergies  Allergen Reactions  . Codeine Nausea And Vomiting    But has tolerated morphine    Patient Measurements: Height: 5\' 2"  (157.5 cm) Weight: 192 lb 7.4 oz (87.3 kg) (scale b) IBW/kg (Calculated) : 50.1  Heparin Dosing Weight: 70 kg  Vital Signs: Temp: 97.4 F (36.3 C) (09/05 0445) Temp src: Oral (09/05 0445) BP: 141/88 mmHg (09/05 0930) Pulse Rate: 69  (09/05 0930)  Labs:  Basename 08/14/12 0251 08/13/12 2118 08/13/12 2113 08/13/12 1229  HGB 13.0 -- -- 14.4  HCT 38.1 -- -- 42.2  PLT 343 -- -- 358  APTT -- -- -- --  LABPROT -- 12.2 -- --  INR -- 0.89 -- --  HEPARINUNFRC -- -- -- --  CREATININE 0.72 -- -- 0.68  CKTOTAL -- -- -- --  CKMB -- -- -- --  TROPONINI <0.30 -- <0.30 --    Estimated Creatinine Clearance: 80.6 ml/min (by C-G formula based on Cr of 0.72).  Assessment: 56 y.o. Female presents with chest pain. Received lovenox 90 mg last night at 2240 for r/o ACS. Plan to cath today but now will be later this afternoon so MD would like to start heparin gtt in the meantime. No bleeding noted and CBC stable.  Goal of Therapy:  Heparin level 0.3-0.7 units/ml Monitor platelets by anticoagulation protocol: Yes   Plan:  1. Heparin gtt at 950 units/hr. No bolus with lovenox given last p.m. 2. Will f/u post cath  Christoper Fabian, PharmD, BCPS Clinical pharmacist, pager [phone] 08/14/2012,9:58 AM  Addendum: Noted heparin not started as pt to cath lab earlier than expected.

## 2012-08-14 NOTE — H&P (View-Only) (Signed)
 Patient: Amanda Mendez Date of Encounter: 08/14/2012, 9:50 AM Admit date: 08/13/2012     Subjective  ++urination, net 2L thus far - "still going to the bathroom this morning!" Weight down 4 lbs with 1 dose of 40mg IV Lasix. No SOB but intermittent chest pressure persists. Has ruled out for MI. She was surprised to hear her A1C was so high (9.3)   Objective   Telemetry: NSR. Lots of motion artifact. Physical Exam: Filed Vitals:   08/14/12 0930  BP: 141/88  Pulse: 69  Temp: 97.4  Resp: 18. POX 95% RA   General: Well developed, well nourished WF in no acute distress. Head: Normocephalic, atraumatic, sclera non-icteric, no xanthomas, nares are without discharge.  Neck: Negative for carotid bruits. JVD not elevated. Lungs: Coarse crackles 1/2 way up bilaterally. No wheezes or rhonchi. Breathing is unlabored. Heart: RRR S1 S2 +S3 without murmurs or rubs.  Abdomen: Soft, non-tender, non-distended with normoactive bowel sounds. No hepatomegaly. No rebound/guarding. No obvious abdominal masses. Msk:  Strength and tone appear normal for age. Extremities: No clubbing or cyanosis. Tr-1+ edema (improved from yesterday - again, initially difficult to tell but still has pitting & pt states her legs are still swollen to her).  Distal pedal pulses are 2+ and equal bilaterally. Neuro: Alert and oriented X 3. Moves all extremities spontaneously. Psych:  Responds to questions appropriately with a normal affect.    Intake/Output Summary (Last 24 hours) at 08/14/12 0950 Last data filed at 08/14/12 0932  Gross per 24 hour  Intake 251.25 ml  Output   2425 ml  Net -2173.75 ml    Inpatient Medications:    . sodium chloride   Intravenous Once  . aspirin  324 mg Oral Once  . aspirin  324 mg Oral Pre-Cath  . aspirin EC  81 mg Oral Daily  . atorvastatin  40 mg Oral q1800  . enoxaparin (LOVENOX) injection  90 mg Subcutaneous Once  . furosemide  40 mg Intravenous Once  . gi cocktail  30 mL Oral Once    . hydrochlorothiazide  25 mg Oral Daily  . insulin aspart  0-9 Units Subcutaneous TID WC  . losartan  100 mg Oral Daily  . metoprolol  50 mg Oral BID  .  morphine injection  2 mg Intramuscular Once  . nitroGLYCERIN  0.5 inch Topical Once  . nitroGLYCERIN  0.4 mg Sublingual Once  . pneumococcal 23 valent vaccine  0.5 mL Intramuscular Tomorrow-1000  . potassium chloride  40 mEq Oral Once  . sodium chloride  3 mL Intravenous Q12H  . DISCONTD: losartan-hydrochlorothiazide  1 tablet Oral QPM    Labs:  Basename 08/14/12 0251 08/13/12 1229  NA 134* 139  K 3.3* 3.5  CL 93* 98  CO2 27 28  GLUCOSE 318* 199*  BUN 15 13  CREATININE 0.72 0.68  CALCIUM 9.9 10.1  MG -- --  PHOS -- --    Basename 08/13/12 1229  AST 31  ALT 47*  ALKPHOS 81  BILITOT 0.3  PROT 7.7  ALBUMIN 4.1    Basename 08/14/12 0251 08/13/12 1229  WBC 10.8* 8.6  NEUTROABS -- 4.8  HGB 13.0 14.4  HCT 38.1 42.2  MCV 92.9 92.7  PLT 343 358    Basename 08/14/12 0251 08/13/12 2113  CKTOTAL -- --  CKMB -- --  TROPONINI <0.30 <0.30    Basename 08/13/12 2118  HGBA1C 9.3*    Basename 08/14/12 0251  CHOL 275*  HDL 43  LDLCALC   UNABLE TO CALCULATE IF TRIGLYCERIDE OVER 400 mg/dL  TRIG 557*  CHOLHDL 6.4    Basename 08/13/12 2118  TSH 4.377  T4TOTAL --  T3FREE --  THYROIDAB --   Radiology/Studies:  Dg Chest 2 View 08/13/2012  *RADIOLOGY REPORT*  Clinical Data: Chest pain, respiratory distress  CHEST - 2 VIEW  Comparison: None.  Findings: The lungs are clear.  Mediastinal contours appear normal. The heart is within upper limits of normal.  No bony abnormality is seen.  IMPRESSION: No active lung disease.   Original Report Authenticated By: PAUL D. BARRY, M.D.      Assessment and Plan   1. Chest pain concerning for unstable angina - received Lovenox last night, on hold for cath today. Initially planned to add heparin back given that cath was going to be this afternoon, but just got word they are calling for  her. Continue ASA, statin, BB.  2.  Acute congestive heart failure, type unknown - R&L heart cath today with LV gram. May need 2D echo. Diuresed well with 1 dose of IV Lasix. Will await cath results and MD decision regarding further diuresis (she states she's gained 12 lbs in 1 month, is down 4lbs today).  3. Diabetes mellitus, uncontrolled with A1C 9.3 - Metformin on hold, SSI initiated. Depending on outcome of cath, will likely need uptitration of Metformin +/- additional agent as outpatient with close PCP followup with Dr. Nyland.  4. HTN - BP relatively controlled at present. Follow with diuresis. 5. Tobacco abuse, quit - congratulated on cessation, encouraged continued abstinence. 6. Morbid obesity BMI 35.7 - encouraged on healthy weight loss strategies. 7. Dyslipidemia - trigs are high likely related to #3, consider addition of fish oil. 8. Hypokalemia - likely from Lasix, repleted with 40meq of KCl.  Signed, Japleen Tornow PA-C  

## 2012-08-14 NOTE — CV Procedure (Signed)
   CARDIAC CATH NOTE  Name: Amanda Mendez MRN: [id number] DOB: [birth year]  Procedure: PTCA and stenting of the OM  Indication: Unstable angina. Diagnostic cath showed severe single vessel disease with critical 90% stenosis of the first OM branch of the LCx. After film review, this was considered to be the culprit vessel.  Procedural Details: The right groin was prepped, draped. There were indwelling sheaths in the RFA and RFV from her diagnostic cath. These were changed out to over guidewires. Weight-based bivalirudin was given for anticoagulation. She had been loaded with plavix 600 mg. Once a therapeutic ACT was achieved, a 6 [place] XB 3.5 cm guide catheter was inserted.  A cougar coronary guidewire was used to cross the lesion.  The lesion was predilated with a 2.0 balloon.  The lesion was then stented with a 2.5 x 12 Xience drug-eluting stent.  The stent was postdilated with a 2.75 mm noncompliant balloon.  Following PCI, there was 0% residual stenosis and TIMI-3 flow. Final angiography confirmed an excellent result. The patient tolerated the procedure well. There were no immediate procedural complications. The patient was transferred to the post catheterization recovery area for further monitoring.  Lesion Data: Vessel: OM Percent stenosis (pre): 90 TIMI-flow (pre):  3 Stent:  2.5x12 Xience DES Percent stenosis (post): 0 TIMI-flow (post): 3  Conclusions: Successful PCI of the first OM  Recommendations: ASA/plavix at least 12 months  Tonny Bollman 08/14/2012, 2:02 PM

## 2012-08-14 NOTE — Interval H&P Note (Signed)
History and Physical Interval Note:  08/14/2012 11:27 AM  Amanda Mendez  has presented today for surgery, with the diagnosis of cp  The various methods of treatment have been discussed with the patient and family. After consideration of risks, benefits and other options for treatment, the patient has consented to  Procedure(s) (LRB) with comments: LEFT AND RIGHT HEART CATHETERIZATION WITH CORONARY ANGIOGRAM (N/A) as a surgical intervention .  The patient's history has been reviewed, patient examined, no change in status, stable for surgery.  I have reviewed the patient's chart and labs.  Questions were answered to the patient's satisfaction.     Rollene Rotunda

## 2012-08-14 NOTE — Progress Notes (Signed)
Patient: Amanda Mendez Date of Encounter: 08/14/2012, 9:50 AM Admit date: 08/13/2012     Subjective  ++urination, net 2L thus far - "still going to the bathroom this morning!" Weight down 4 lbs with 1 dose of 40mg  IV Lasix. No SOB but intermittent chest pressure persists. Has ruled out for MI. She was surprised to hear her A1C was so high (9.3)   Objective   Telemetry: NSR. Lots of motion artifact. Physical Exam: Filed Vitals:   08/14/12 0930  BP: 141/88  Pulse: 69  Temp: 97.4  Resp: 18. POX 95% RA   General: Well developed, well nourished WF in no acute distress. Head: Normocephalic, atraumatic, sclera non-icteric, no xanthomas, nares are without discharge.  Neck: Negative for carotid bruits. JVD not elevated. Lungs: Coarse crackles 1/2 way up bilaterally. No wheezes or rhonchi. Breathing is unlabored. Heart: RRR S1 S2 +S3 without murmurs or rubs.  Abdomen: Soft, non-tender, non-distended with normoactive bowel sounds. No hepatomegaly. No rebound/guarding. No obvious abdominal masses. Msk:  Strength and tone appear normal for age. Extremities: No clubbing or cyanosis. Tr-1+ edema (improved from yesterday - again, initially difficult to tell but still has pitting & pt states her legs are still swollen to her).  Distal pedal pulses are 2+ and equal bilaterally. Neuro: Alert and oriented X 3. Moves all extremities spontaneously. Psych:  Responds to questions appropriately with a normal affect.    Intake/Output Summary (Last 24 hours) at 08/14/12 0950 Last data filed at 08/14/12 0932  Gross per 24 hour  Intake 251.25 ml  Output   2425 ml  Net -2173.75 ml    Inpatient Medications:    . sodium chloride   Intravenous Once  . aspirin  324 mg Oral Once  . aspirin  324 mg Oral Pre-Cath  . aspirin EC  81 mg Oral Daily  . atorvastatin  40 mg Oral q1800  . enoxaparin (LOVENOX) injection  90 mg Subcutaneous Once  . furosemide  40 mg Intravenous Once  . gi cocktail  30 mL Oral Once    . hydrochlorothiazide  25 mg Oral Daily  . insulin aspart  0-9 Units Subcutaneous TID WC  . losartan  100 mg Oral Daily  . metoprolol  50 mg Oral BID  .  morphine injection  2 mg Intramuscular Once  . nitroGLYCERIN  0.5 inch Topical Once  . nitroGLYCERIN  0.4 mg Sublingual Once  . pneumococcal 23 valent vaccine  0.5 mL Intramuscular Tomorrow-1000  . potassium chloride  40 mEq Oral Once  . sodium chloride  3 mL Intravenous Q12H  . DISCONTD: losartan-hydrochlorothiazide  1 tablet Oral QPM    Labs:  Basename 08/14/12 0251 08/13/12 1229  NA 134* 139  K 3.3* 3.5  CL 93* 98  CO2 27 28  GLUCOSE 318* 199*  BUN 15 13  CREATININE 0.72 0.68  CALCIUM 9.9 10.1  MG -- --  PHOS -- --    Basename 08/13/12 1229  AST 31  ALT 47*  ALKPHOS 81  BILITOT 0.3  PROT 7.7  ALBUMIN 4.1    Basename 08/14/12 0251 08/13/12 1229  WBC 10.8* 8.6  NEUTROABS -- 4.8  HGB 13.0 14.4  HCT 38.1 42.2  MCV 92.9 92.7  PLT 343 358    Basename 08/14/12 0251 08/13/12 2113  CKTOTAL -- --  CKMB -- --  TROPONINI <0.30 <0.30    Basename 08/13/12 2118  HGBA1C 9.3*    Basename 08/14/12 0251  CHOL 275*  HDL 43  LDLCALC  UNABLE TO CALCULATE IF TRIGLYCERIDE OVER 400 mg/dL  TRIG 409*  CHOLHDL 6.4    Basename 08/13/12 2118  TSH 4.377  T4TOTAL --  T3FREE --  THYROIDAB --   Radiology/Studies:  Dg Chest 2 View 08/13/2012  *RADIOLOGY REPORT*  Clinical Data: Chest pain, respiratory distress  CHEST - 2 VIEW  Comparison: None.  Findings: The lungs are clear.  Mediastinal contours appear normal. The heart is within upper limits of normal.  No bony abnormality is seen.  IMPRESSION: No active lung disease.   Original Report Authenticated By: Juline Patch, M.D.      Assessment and Plan   1. Chest pain concerning for unstable angina - received Lovenox last night, on hold for cath today. Initially planned to add heparin back given that cath was going to be this afternoon, but just got word they are calling for  her. Continue ASA, statin, BB.  2.  Acute congestive heart failure, type unknown - R&L heart cath today with LV gram. May need 2D echo. Diuresed well with 1 dose of IV Lasix. Will await cath results and MD decision regarding further diuresis (she states she's gained 12 lbs in 1 month, is down 4lbs today).  3. Diabetes mellitus, uncontrolled with A1C 9.3 - Metformin on hold, SSI initiated. Depending on outcome of cath, will likely need uptitration of Metformin +/- additional agent as outpatient with close PCP followup with Dr. Lysbeth Galas.  4. HTN - BP relatively controlled at present. Follow with diuresis. 5. Tobacco abuse, quit - congratulated on cessation, encouraged continued abstinence. 6. Morbid obesity BMI 35.7 - encouraged on healthy weight loss strategies. 7. Dyslipidemia - trigs are high likely related to #3, consider addition of fish oil. 8. Hypokalemia - likely from Lasix, repleted with of KCl.  Signed, Ronie Spies PA-C

## 2012-08-15 ENCOUNTER — Encounter (HOSPITAL_COMMUNITY): Payer: Self-pay | Admitting: Physician Assistant

## 2012-08-15 LAB — POCT I-STAT 3, ART BLOOD GAS (G3+)
O2 Saturation: 88 %
TCO2: 31 mmol/L (ref 0–100)
pCO2 arterial: 45.9 mmHg — ABNORMAL HIGH (ref 35.0–45.0)
pH, Arterial: 7.419 (ref 7.350–7.450)
pO2, Arterial: 55 mmHg — ABNORMAL LOW (ref 80.0–100.0)

## 2012-08-15 LAB — BASIC METABOLIC PANEL
Calcium: 9.4 mg/dL (ref 8.4–10.5)
GFR calc Af Amer: >90 mL/min (ref >90)
GFR calc non Af Amer: >90 mL/min (ref >90)
Potassium: 3.7 mEq/L (ref 3.5–5.1)
Sodium: 135 mEq/L (ref 135–145)

## 2012-08-15 LAB — POCT I-STAT 3, VENOUS BLOOD GAS (G3P V)
Bicarbonate: 29.4 mEq/L — ABNORMAL HIGH (ref 20.0–24.0)
pH, Ven: 7.375 — ABNORMAL HIGH (ref 7.250–7.300)
pO2, Ven: 31 mmHg (ref 30.0–45.0)

## 2012-08-15 LAB — CBC
Hemoglobin: 13.2 g/dL (ref 12.0–15.0)
MCH: 31.9 pg (ref 26.0–34.0)
Platelets: 321 10*3/uL (ref 150–400)
RBC: 4.14 MIL/uL (ref 3.87–5.11)
WBC: 9.9 10*3/uL (ref 4.0–10.5)

## 2012-08-15 LAB — GLUCOSE, CAPILLARY: Glucose-Capillary: 158 mg/dL — ABNORMAL HIGH (ref 70–99)

## 2012-08-15 MED ORDER — METFORMIN HCL 500 MG PO TABS: 500.0000 mg | ORAL_TABLET | Freq: Two times a day (BID) | ORAL | Status: DC

## 2012-08-15 MED ORDER — CLOPIDOGREL BISULFATE 75 MG PO TABS: 75.0000 mg | ORAL_TABLET | Freq: Every day | ORAL | Status: AC

## 2012-08-15 MED ORDER — NITROGLYCERIN 0.4 MG SL SUBL: 0.4000 mg | SUBLINGUAL_TABLET | SUBLINGUAL | Status: DC | PRN

## 2012-08-15 MED ORDER — ATORVASTATIN CALCIUM 40 MG PO TABS: 40.0000 mg | ORAL_TABLET | Freq: Every day | ORAL | Status: DC

## 2012-08-15 MED ORDER — ASPIRIN 81 MG PO TABS: 81.0000 mg | ORAL_TABLET | Freq: Every day | ORAL | Status: DC

## 2012-08-15 MED FILL — Dextrose Inj 5%: INTRAVENOUS | Qty: 50 | Status: AC

## 2012-08-15 NOTE — Progress Notes (Signed)
    Subjective:  Feels well. No chest pain or dyspnea.  Objective:  Vital Signs in the last 24 hours: Temp:  [97.2 F (36.2 C)-98.4 F (36.9 C)] 97.2 F (36.2 C) (09/06 0815) Pulse Rate:  [59-80] 69  (09/06 0815) Resp:  [12-22] 12  (09/06 0815) BP: (108-141)/(58-88) 141/74 mmHg (09/06 0815) SpO2:  [93 %-98 %] 95 % (09/06 0815) Weight:  [90.2 kg (198 lb 13.7 oz)] 90.2 kg (198 lb 13.7 oz) (09/05 2315)  Intake/Output from previous day: 09/05 0701 - 09/06 0700 In: 723 [P.O.:720; I.V.:3] Out: 1225 [Urine:1225]  Physical Exam: Pt is alert and oriented, NAD HEENT: normal Neck: JVP - normal, carotids 2+= without bruits Lungs: CTA bilaterally CV: RRR without murmur or gallop Abd: soft, NT, Positive BS, no hepatomegaly Ext: no C/C/E, distal pulses intact and equal. Right groin clear. Skin: warm/dry no rash  Lab Results:  Basename 08/15/12 0530 08/14/12 0251  WBC 9.9 10.8*  HGB 13.2 13.0  PLT 321 343    Basename 08/15/12 0530 08/14/12 0251  NA 135 134*  K 3.7 3.3*  CL 97 93*  CO2 25 27  GLUCOSE 208* 318*  BUN 14 15  CREATININE 0.70 0.72    Basename 08/14/12 1646 08/14/12 0251  TROPONINI <0.30 <0.30   Tele: sinus rhythm  Assessment/Plan:  1. Unstable angina - doing well after PCI. Home today after cardiac rehab. ASA/plavix at least 12 months.  2. Diabetes, type 2. Uncontrolled. Education about meds/diet.   Other problems stable. Discharge home today. Follow-up Dr Antoine Poche in [place]. Follow-up with PCP.  Tonny Bollman, M.D. 08/15/2012, 8:31 AM

## 2012-08-15 NOTE — Discharge Summary (Addendum)
CARDIOLOGY DISCHARGE SUMMARY   Patient ID: Amanda Mendez MRN: [id number] DOB/AGE: 57-Aug-1957 57 y.o.  Admit date: 08/13/2012 Discharge date: 08/15/2012  Primary Discharge Diagnosis:  Unstable Angina, s/p PTCA and 2.5 x 12 Xience drug-eluting stenting of the OM  Secondary Discharge Diagnosis:  Past Medical History  Diagnosis Date  . Diabetes mellitus     Since age 11  . Hypertension   . Tobacco abuse     Smoked for 40 years  . Obesity (BMI 30-39.9)   . Hypercholesterolemia with hypertriglyceridemia    Procedures: Right Heart Cath, Left Heart Cath, Selective Coronary Angiography, LV angiography, PTCA and stenting of the OM  Hospital Course: Amanda Mendez is a 56 year old female with no previous history of coronary artery disease. She came to the emergency room complaining of chest pain and leg swelling. She was admitted for further evaluation and treatment.  She was felt to have some volume overload with lower extremity swelling. It was felt this was likely secondary to diastolic dysfunction. She was successfully diuresed with IV Lasix and she lost over 2-1/2 L during her hospital stay. Her respiratory status improved and her oxygen saturation was normal on room air.  Her ECG had some mild changes. Her cardiac enzymes were negative but she has multiple cardiac risk factors and cardiac catheterization was recommended to further define her anatomy. She was taken to the cath lab on 08/14/2012.  The cardiac catheterization results are listed below. She received a drug-eluting stent to the OM and tolerated the procedure well. She had some mild elevation in right heart pressures at cath and this will be followed medically. Her lipid profile is listed below. She was started on a beta blocker and a statin.   She was seen by the diabetes coordinator and increasing her metformin as well as adding Januvia 50 mg daily was recommended. She is encouraged to followup with her primary care physician to  make these changes.  On 08/15/2012, she was seen by Dr. Excell Seltzer and by cardiac rehabilitation. She was educated on heart healthy lifestyle and a diabetic, low-cholesterol diet. She quit smoking last month and continued cessation was encouraged. She was ambulating without chest pain or shortness of breath and considered stable for discharge, to follow up as an outpatient.  Labs:   Lab Results  Component Value Date   WBC 9.9 08/15/2012   HGB 13.2 08/15/2012   HCT 38.7 08/15/2012   MCV 93.5 08/15/2012   PLT 321 08/15/2012    Lab 08/15/12 0530 08/14/12 1647  NA 135 --  K 3.7 --  CL 97 --  CO2 25 --  BUN 14 --  CREATININE 0.70 --  CALCIUM 9.4 --  PROT -- 6.7  BILITOT -- 0.4  ALKPHOS -- 76  ALT -- 43*  AST -- 35  GLUCOSE 208* --    Basename 08/14/12 1646 08/14/12 0251 08/13/12 2113  CKTOTAL -- -- --  CKMB -- -- --  CKMBINDEX -- -- --  TROPONINI <0.30 <0.30 <0.30   Lipid Panel     Component Value Date/Time   CHOL 275* 08/14/2012 0251   TRIG 557* 08/14/2012 0251   HDL 43 08/14/2012 0251   CHOLHDL 6.4 08/14/2012 0251   VLDL UNABLE TO CALCULATE IF TRIGLYCERIDE OVER 400 mg/dL 4/0/9811 9147   LDLCALC UNABLE TO CALCULATE IF TRIGLYCERIDE OVER 400 mg/dL 07/11/9561 1308    Pro B Natriuretic peptide (BNP)  Date/Time Value Range Status  08/13/2012  4:32 PM 63.2  0 - 125 pg/mL Final  Basename 08/13/12 2118  INR 0.89      Radiology: Dg Chest 2 View 08/13/2012  *RADIOLOGY REPORT*  Clinical Data: Chest pain, respiratory distress  CHEST - 2 VIEW  Comparison: None.  Findings: The lungs are clear.  Mediastinal contours appear normal. The heart is within upper limits of normal.  No bony abnormality is seen.  IMPRESSION: No active lung disease.   Original Report Authenticated By: Juline Patch, M.D.    Cardiac Cath: 08/14/2012 Left mainstem: Normal  Left anterior descending (LAD): Large wrapping the apex. Mid diagonal large with proximal 25%  Left circumflex (LCx): Co dominant. AV large and normal. OM  large and branching with proximal 80%. PDA large and normal  Right coronary artery (RCA): Small to moderate with a high anterior take off. Mild luminal irregularities. Mid 60%. PDA small and normal  Left ventriculography: Left ventricular systolic function is normal, LVEF is estimated at 55-65%, there is no significant mitral regurgitation  Final Conclusions: Severe OM disease. Mildly elevated right sided pressures.  Recommendations: The patient will have PCI of the OM lesion. She will have medical management of the elevated right sided pressures and further assessment based on future symptoms.  Lesion Data:  Vessel: OM  Percent stenosis (pre): 90  TIMI-flow (pre): 3  Stent: 2.5x12 Xience DES  Percent stenosis (post): 0  TIMI-flow (post): 3  EKG: 15-Aug-2012 06:14:12  Normal sinus rhythm Anterior infarct , age undetermined Abnormal ECG Vent. rate 63 BPM PR interval 152 ms QRS duration 84 ms QT/QTc 402/411 ms P-R-T axes 57 47 66  FOLLOW UP PLANS AND APPOINTMENTS Allergies  Allergen Reactions  . Codeine Nausea And Vomiting    But has tolerated morphine   Medication List  As of 08/15/2012 10:00 AM   TAKE these medications         aspirin 81 MG tablet   Take 1 tablet (81 mg total) by mouth daily.      atorvastatin 40 MG tablet   Commonly known as: LIPITOR   Take 1 tablet (40 mg total) by mouth daily.      clopidogrel 75 MG tablet   Commonly known as: PLAVIX   Take 1 tablet (75 mg total) by mouth daily.      losartan-hydrochlorothiazide 100-25 MG per tablet   Commonly known as: HYZAAR   Take 1 tablet by mouth every evening.      metFORMIN 500 MG tablet   Commonly known as: GLUCOPHAGE   Take 1 tablet (500 mg total) by mouth 2 (two) times daily with a meal. HOLD for 48 hours, restart on 08/17/2012.      metoprolol 50 MG tablet   Commonly known as: LOPRESSOR   Take 50 mg by mouth 2 (two) times daily.      nitroGLYCERIN 0.4 MG SL tablet   Commonly known as: NITROSTAT    Place 1 tablet (0.4 mg total) under the tongue every 5 (five) minutes as needed for chest pain.            Discharge Orders    Future Appointments: Provider: Department: Dept Phone: Center:   09/03/2012 10:45 AM Rollene Rotunda, MD [facility] [phone] [facility]     Future Orders Please Complete By Expires   Diet - low sodium heart healthy      Diet Carb Modified      Increase activity slowly        Follow-up Information    Follow up with Rollene Rotunda, MD. (September 25th at 10:45  am at the [facility].)    Contact information:   [address] [place] [address] [phone]          BRING ALL MEDICATIONS WITH YOU TO FOLLOW UP APPOINTMENTS  Time spent with patient to include physician time: 35 min Signed: Theodore Demark 08/15/2012, 10:00 AM Co-Sign MD

## 2012-08-15 NOTE — Progress Notes (Signed)
CARDIAC REHAB PHASE I   PRE:  Rate/Rhythm: 90 SR  BP:  Supine:   Sitting: 128/63  Standing:    SaO2:   MODE:  Ambulation: 1000 ft   POST:  Rate/Rhythem: 94 SR  BP:  Supine:   Sitting: 128/65  Standing:    SaO2:  0950-1100 Tolerated ambulation well, only c/o is of sore groin. She denies any cp or SOB. VS stable. Completed discharge education with pt and husband. She agrees to [employer]. [facility] in [place] will send referral. Discussed smoking cessation she states that she quit 06/11/12 and she is done. Gave pt smoking cessation information and telephone numbers to call for coaching.  Amanda Mendez

## 2012-08-29 ENCOUNTER — Encounter: Payer: BC Managed Care – PPO | Admitting: Physician Assistant

## 2012-09-03 ENCOUNTER — Ambulatory Visit (INDEPENDENT_AMBULATORY_CARE_PROVIDER_SITE_OTHER): Payer: BC Managed Care – PPO | Admitting: Cardiology

## 2012-09-03 ENCOUNTER — Encounter: Payer: Self-pay | Admitting: Cardiology

## 2012-09-03 ENCOUNTER — Other Ambulatory Visit: Payer: Self-pay | Admitting: Cardiology

## 2012-09-03 VITALS — BP 115/70 | HR 65 | Ht 62.0 in | Wt 196.0 lb

## 2012-09-03 DIAGNOSIS — I998 Other disorder of circulatory system: Secondary | ICD-10-CM | POA: Insufficient documentation

## 2012-09-03 DIAGNOSIS — I10 Essential (primary) hypertension: Secondary | ICD-10-CM

## 2012-09-03 DIAGNOSIS — I2510 Atherosclerotic heart disease of native coronary artery without angina pectoris: Secondary | ICD-10-CM | POA: Insufficient documentation

## 2012-09-03 DIAGNOSIS — I5031 Acute diastolic (congestive) heart failure: Secondary | ICD-10-CM

## 2012-09-03 DIAGNOSIS — I200 Unstable angina: Secondary | ICD-10-CM

## 2012-09-03 MED ORDER — FUROSEMIDE 20 MG PO TABS: 20.0000 mg | ORAL_TABLET | ORAL | Status: DC | PRN

## 2012-09-03 NOTE — Patient Instructions (Addendum)
The current medical regimen is effective;  continue present plan and medications. You may take Lasix (Furosmide) 20 mg as needed.  Please have blood work at Dr Avery Dennison office ([facility]).  Follow up in 2 months with Dr Antoine Poche in [place].

## 2012-09-03 NOTE — Progress Notes (Signed)
HPI The patient presents for followup after hospitalization for evaluation of chest pain and leg swelling. She ruled out for myocardial infarction but was found to have coronary disease as described below. She had PCI of the circumflex lesion. 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 She's also had some pain slightly down her groin and right leg where she had her catheterization. She overall thinks she feels better however. She's not describing any new shortness of breath, PND or. She's had no new palpitations, presyncope or syncope. She has some mild bruising down her right leg which is improved.  Allergies  Allergen Reactions  . Codeine Nausea And Vomiting    But has tolerated morphine    Current Outpatient Prescriptions  Medication Sig Dispense Refill  . aspirin 81 MG tablet Take 1 tablet (81 mg total) by mouth daily.  30 tablet    . atorvastatin (LIPITOR) 40 MG tablet Take 1 tablet (40 mg total) by mouth daily.  30 tablet  11  . clopidogrel (PLAVIX) 75 MG tablet Take 1 tablet (75 mg total) by mouth daily.  30 tablet  11  . losartan-hydrochlorothiazide (HYZAAR) 100-25 MG per tablet Take 1 tablet by mouth every evening.      . metFORMIN (GLUCOPHAGE) 500 MG tablet Take 1 tablet (500 mg total) by mouth 2 (two) times daily with a meal. HOLD for 48 hours, restart on 08/17/2012.  60 tablet  11  . metoprolol (LOPRESSOR) 50 MG tablet Take 50 mg by mouth 2 (two) times daily.      . nitroGLYCERIN (NITROSTAT) 0.4 MG SL tablet Place 1 tablet (0.4 mg total) under the tongue every 5 (five) minutes as needed for chest pain.  25 tablet  3    Past Medical History  Diagnosis Date  . Diabetes mellitus     Since age 51  . Hypertension   . Tobacco abuse     Smoked for 40 years  . Obesity (BMI 30-39.9)   . Hypercholesterolemia with hypertriglyceridemia     Past Surgical History  Procedure  Date  . Tubal ligation     ROS:  As stated in the HPI and negative for all other systems.  PHYSICAL EXAM BP 115/70  Pulse 65  Ht 5\' 2"  (1.575 m)  Wt 196 lb (88.905 kg)  BMI 35.85 kg/m2 GENERAL:  Well appearing HEENT:  Pupils equal round and reactive, fundi not visualized, oral mucosa unremarkable NECK:  No jugular venous distention, waveform within normal limits, carotid upstroke brisk and symmetric, no bruits, no thyromegaly LYMPHATICS:  No cervical, inguinal adenopathy LUNGS:  Clear to auscultation bilaterally BACK:  No CVA tenderness CHEST:  Unremarkable HEART:  PMI not displaced or sustained,S1 and S2 within normal limits, no S3, no S4, no clicks, no rubs, no murmurs ABD:  Flat, positive bowel sounds normal in frequency in pitch, no bruits, no rebound, no guarding, no midline pulsatile mass, no hepatomegaly, no splenomegaly, obese EXT:  2 plus pulses throughout, mild bilateral ankle edema, no cyanosis no clubbing, right groin without bruit or pulsatile mass myxoma or ecchymosis at the cath site SKIN:  No rashes no nodules NEURO:  Cranial nerves II through XII grossly intact, motor grossly intact throughout PSYCH:  Cognitively intact, oriented to person place and time   ASSESSMENT AND PLAN  CAD - At this point I don't think her symptoms or related  to her coronary disease. No further testing is indicated. She needs aggressive risk reduction.  Diabetes mellitus  -  I reviewed with her I hemoglobin A1c in the hospital with 9.3. I instructed her to go back to Josue Hector, MD for further blood sugar control and I reviewed with her the importance of this.  Hypertension - The blood pressure is at target. No change in medications is indicated. We will continue with therapeutic lifestyle changes (TLC).  Tobacco abuse - She says she has stopped smoking and she understands the importance of continued cessation.  Obesity  - I will continue to educate her about diet and  exercise.  Hypercholesterolemia with hypertriglyceridemia - Her triglycerides were severely elevated related to her poor diabetes control. I discussed this with her and she will again followup with Josue Hector, MD  Hypokalemia - I will have her get a basic metabolic profile her last potassium leaving the hospital was slightly low.  Diastolic heart failure - She is to have some component of this and she understands salt and fluid restriction. She does have some mild ankle edema and I will give her Lasix to take 20 mg as needed with weight gain. She is to let him know if she does this frequently as she will need to have followup blood work.

## 2012-09-04 LAB — BASIC METABOLIC PANEL
CO2: 25 mEq/L (ref 19–32)
Calcium: 9 mg/dL (ref 8.4–10.5)
Chloride: 103 mEq/L (ref 96–112)
Creat: 0.96 mg/dL (ref 0.50–1.10)
Glucose, Bld: 202 mg/dL — ABNORMAL HIGH (ref 70–99)
Sodium: 140 mEq/L (ref 135–145)

## 2012-09-19 ENCOUNTER — Encounter (HOSPITAL_BASED_OUTPATIENT_CLINIC_OR_DEPARTMENT_OTHER): Payer: Self-pay | Admitting: *Deleted

## 2012-09-19 ENCOUNTER — Other Ambulatory Visit: Payer: Self-pay | Admitting: Orthopedic Surgery
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
## 2012-09-22 ENCOUNTER — Encounter (HOSPITAL_BASED_OUTPATIENT_CLINIC_OR_DEPARTMENT_OTHER): Payer: Self-pay | Admitting: Certified Registered"

## 2012-09-22 ENCOUNTER — Ambulatory Visit (HOSPITAL_BASED_OUTPATIENT_CLINIC_OR_DEPARTMENT_OTHER)
Admission: RE | Admit: 2012-09-22 | Discharge: 2012-09-22 | Disposition: A | Discharge status: Home / Self Care | Payer: BC Managed Care – PPO | Source: Ambulatory Visit | Attending: Orthopedic Surgery | Admitting: Orthopedic Surgery

## 2012-09-22 ENCOUNTER — Encounter (HOSPITAL_BASED_OUTPATIENT_CLINIC_OR_DEPARTMENT_OTHER): Payer: Self-pay | Admitting: Anesthesiology

## 2012-09-22 ENCOUNTER — Encounter (HOSPITAL_BASED_OUTPATIENT_CLINIC_OR_DEPARTMENT_OTHER): Payer: Self-pay

## 2012-09-22 ENCOUNTER — Encounter (HOSPITAL_BASED_OUTPATIENT_CLINIC_OR_DEPARTMENT_OTHER)
Admission: RE | Disposition: A | Discharge status: Home / Self Care | Payer: Self-pay | Source: Ambulatory Visit | Attending: Orthopedic Surgery

## 2012-09-22 ENCOUNTER — Ambulatory Visit (HOSPITAL_BASED_OUTPATIENT_CLINIC_OR_DEPARTMENT_OTHER): Payer: BC Managed Care – PPO | Admitting: Anesthesiology

## 2012-09-22 DIAGNOSIS — Y929 Unspecified place or not applicable: Secondary | ICD-10-CM | POA: Insufficient documentation

## 2012-09-22 DIAGNOSIS — E7800 Pure hypercholesterolemia, unspecified: Secondary | ICD-10-CM | POA: Insufficient documentation

## 2012-09-22 DIAGNOSIS — I2510 Atherosclerotic heart disease of native coronary artery without angina pectoris: Secondary | ICD-10-CM | POA: Insufficient documentation

## 2012-09-22 DIAGNOSIS — F17200 Nicotine dependence, unspecified, uncomplicated: Secondary | ICD-10-CM | POA: Insufficient documentation

## 2012-09-22 DIAGNOSIS — E119 Type 2 diabetes mellitus without complications: Secondary | ICD-10-CM | POA: Insufficient documentation

## 2012-09-22 DIAGNOSIS — S60229A Contusion of unspecified hand, initial encounter: Secondary | ICD-10-CM | POA: Insufficient documentation

## 2012-09-22 DIAGNOSIS — E669 Obesity, unspecified: Secondary | ICD-10-CM | POA: Insufficient documentation

## 2012-09-22 DIAGNOSIS — I10 Essential (primary) hypertension: Secondary | ICD-10-CM | POA: Insufficient documentation

## 2012-09-22 HISTORY — DX: Atherosclerotic heart disease of native coronary artery without angina pectoris: I25.10

## 2012-09-22 HISTORY — PX: HEMATOMA EVACUATION: SHX5118

## 2012-09-22 LAB — POCT I-STAT, CHEM 8
BUN: 15 mg/dL (ref 6–23)
Calcium, Ion: 1.24 mmol/L — ABNORMAL HIGH (ref 1.12–1.23)
Chloride: 105 mEq/L (ref 96–112)
Glucose, Bld: 170 mg/dL — ABNORMAL HIGH (ref 70–99)
TCO2: 25 mmol/L (ref 0–100)

## 2012-09-22 LAB — GLUCOSE, CAPILLARY: Glucose-Capillary: 122 mg/dL — ABNORMAL HIGH (ref 70–99)

## 2012-09-22 SURGERY — EVACUATION HEMATOMA
Anesthesia: General | Site: Hand | Laterality: Right | Wound class: Contaminated

## 2012-09-22 MED ORDER — CHLORHEXIDINE GLUCONATE 4 % EX LIQD: 60.0000 mL | Freq: Once | CUTANEOUS | Status: DC

## 2012-09-22 MED ORDER — ONDANSETRON HCL 4 MG/2ML IJ SOLN
INTRAMUSCULAR | Status: DC | PRN
  Administered 2012-09-22: 4 mg via INTRAVENOUS

## 2012-09-22 MED ORDER — OXYCODONE HCL 5 MG/5ML PO SOLN: 5.0000 mg | Freq: Once | ORAL | Status: DC | PRN

## 2012-09-22 MED ORDER — MIDAZOLAM HCL 5 MG/5ML IJ SOLN
INTRAMUSCULAR | Status: DC | PRN
  Administered 2012-09-22: 1 mg via INTRAVENOUS

## 2012-09-22 MED ORDER — OXYCODONE HCL 5 MG PO TABS: 5.0000 mg | ORAL_TABLET | Freq: Once | ORAL | Status: DC | PRN

## 2012-09-22 MED ORDER — ONDANSETRON HCL 4 MG/2ML IJ SOLN: 4.0000 mg | Freq: Once | INTRAMUSCULAR | Status: DC | PRN

## 2012-09-22 MED ORDER — LIDOCAINE HCL (CARDIAC) 20 MG/ML IV SOLN
INTRAVENOUS | Status: DC | PRN
  Administered 2012-09-22: 80 mg via INTRAVENOUS

## 2012-09-22 MED ORDER — HYDROMORPHONE HCL PF 1 MG/ML IJ SOLN
0.2500 mg | INTRAMUSCULAR | Status: DC | PRN
  Administered 2012-09-22 (×2): 0.5 mg via INTRAVENOUS

## 2012-09-22 MED ORDER — THROMBIN 20000 UNITS EX KIT
PACK | CUTANEOUS | Status: DC | PRN
  Administered 2012-09-22: 20000 [IU] via TOPICAL

## 2012-09-22 MED ORDER — HYDROMORPHONE HCL PF 1 MG/ML IJ SOLN
0.2500 mg | INTRAMUSCULAR | Status: DC | PRN
  Administered 2012-09-22 (×4): 0.5 mg via INTRAVENOUS

## 2012-09-22 MED ORDER — PROPOFOL 10 MG/ML IV BOLUS
INTRAVENOUS | Status: DC | PRN
Start: 2012-09-22 — End: 2012-09-22
  Administered 2012-09-22: 150 mg via INTRAVENOUS

## 2012-09-22 MED ORDER — CEPHALEXIN 250 MG PO CAPS: 250.0000 mg | ORAL_CAPSULE | Freq: Four times a day (QID) | ORAL | Status: DC

## 2012-09-22 MED ORDER — PENTAZOCINE-NALOXONE 50-0.5 MG PO TABS: 1.0000 | ORAL_TABLET | ORAL | Status: DC | PRN

## 2012-09-22 MED ORDER — LACTATED RINGERS IV SOLN
INTRAVENOUS | Status: DC
  Administered 2012-09-22 (×2): via INTRAVENOUS

## 2012-09-22 MED ORDER — CEFAZOLIN SODIUM-DEXTROSE 2-3 GM-% IV SOLR
2.0000 g | INTRAVENOUS | Status: AC
  Administered 2012-09-22: 2 g via INTRAVENOUS

## 2012-09-22 MED ORDER — FENTANYL CITRATE 0.05 MG/ML IJ SOLN
INTRAMUSCULAR | Status: DC | PRN
  Administered 2012-09-22 (×2): 50 ug via INTRAVENOUS
  Administered 2012-09-22: 25 ug via INTRAVENOUS

## 2012-09-22 SURGICAL SUPPLY — 74 items
BAG DECANTER FOR FLEXI CONT (MISCELLANEOUS) IMPLANT
BANDAGE GAUZE ELAST BULKY 4 IN (GAUZE/BANDAGES/DRESSINGS) ×3 IMPLANT
BLADE MINI RND TIP GREEN BEAV (BLADE) IMPLANT
BLADE SURG 15 STRL LF DISP TIS (BLADE) ×2 IMPLANT
BLADE SURG 15 STRL SS (BLADE) ×1
BNDG COHESIVE 3X5 TAN STRL LF (GAUZE/BANDAGES/DRESSINGS) ×3 IMPLANT
BNDG ESMARK 4X9 LF (GAUZE/BANDAGES/DRESSINGS) ×3 IMPLANT
CHLORAPREP W/TINT 26ML (MISCELLANEOUS) ×3 IMPLANT
CLOTH BEACON ORANGE TIMEOUT ST (SAFETY) ×3 IMPLANT
CORDS BIPOLAR (ELECTRODE) ×3 IMPLANT
COTTONBALL LRG STERILE PKG (GAUZE/BANDAGES/DRESSINGS) IMPLANT
COVER MAYO STAND STRL (DRAPES) ×3 IMPLANT
COVER TABLE BACK 60X90 (DRAPES) ×3 IMPLANT
CUFF TOURNIQUET SINGLE 18IN (TOURNIQUET CUFF) IMPLANT
DECANTER SPIKE VIAL GLASS SM (MISCELLANEOUS) IMPLANT
DRAIN TLS ROUND 10FR (DRAIN) IMPLANT
DRAPE EXTREMITY T 121X128X90 (DRAPE) ×3 IMPLANT
DRAPE OEC MINIVIEW 54X84 (DRAPES) IMPLANT
DRAPE SURG 17X23 STRL (DRAPES) ×3 IMPLANT
DRSG KUZMA FLUFF (GAUZE/BANDAGES/DRESSINGS) IMPLANT
GAUZE SPONGE 4X4 16PLY XRAY LF (GAUZE/BANDAGES/DRESSINGS) IMPLANT
GAUZE XEROFORM 1X8 LF (GAUZE/BANDAGES/DRESSINGS) ×3 IMPLANT
GLOVE BIO SURGEON STRL SZ 6.5 (GLOVE) ×3 IMPLANT
GLOVE BIOGEL PI IND STRL 8 (GLOVE) ×2 IMPLANT
GLOVE BIOGEL PI INDICATOR 8 (GLOVE) ×1
GLOVE SURG ORTHO 8.0 STRL STRW (GLOVE) ×3 IMPLANT
GOWN BRE IMP PREV XXLGXLNG (GOWN DISPOSABLE) ×3 IMPLANT
GOWN PREVENTION PLUS XLARGE (GOWN DISPOSABLE) ×3 IMPLANT
KWIRE 4.0 X .035IN (WIRE) IMPLANT
LOOP VESSEL MAXI BLUE (MISCELLANEOUS) ×3 IMPLANT
NEEDLE 27GAX1X1/2 (NEEDLE) ×3 IMPLANT
NEEDLE HYPO 22GX1.5 SAFETY (NEEDLE) IMPLANT
NEEDLE KEITH (NEEDLE) IMPLANT
NS IRRIG 1000ML POUR BTL (IV SOLUTION) ×3 IMPLANT
PACK BASIN DAY SURGERY FS (CUSTOM PROCEDURE TRAY) ×3 IMPLANT
PAD CAST 3X4 CTTN HI CHSV (CAST SUPPLIES) ×2 IMPLANT
PADDING CAST ABS 3INX4YD NS (CAST SUPPLIES)
PADDING CAST ABS 4INX4YD NS (CAST SUPPLIES) ×1
PADDING CAST ABS COTTON 3X4 (CAST SUPPLIES) IMPLANT
PADDING CAST ABS COTTON 4X4 ST (CAST SUPPLIES) ×2 IMPLANT
PADDING CAST COTTON 3X4 STRL (CAST SUPPLIES) ×1
SLEEVE SCD COMPRESS KNEE MED (MISCELLANEOUS) IMPLANT
SPLINT PLASTER CAST XFAST 3X15 (CAST SUPPLIES) IMPLANT
SPLINT PLASTER XTRA FASTSET 3X (CAST SUPPLIES)
SPONGE GAUZE 4X4 12PLY (GAUZE/BANDAGES/DRESSINGS) ×3 IMPLANT
STOCKINETTE 4X48 STRL (DRAPES) ×3 IMPLANT
SUT CHROMIC 5 0 P 3 (SUTURE) IMPLANT
SUT ETHIBOND 3-0 V-5 (SUTURE) IMPLANT
SUT FIBERWIRE 2-0 18 17.9 3/8 (SUTURE)
SUT FIBERWIRE 4-0 18 TAPR NDL (SUTURE)
SUT MERSILENE 2.0 SH NDLE (SUTURE) IMPLANT
SUT MERSILENE 3 0 FS 1 (SUTURE) IMPLANT
SUT MERSILENE 4 0 P 3 (SUTURE) IMPLANT
SUT POLY BUTTON 15MM (SUTURE) IMPLANT
SUT PROLENE 2 0 SH DA (SUTURE) IMPLANT
SUT SILK 2 0 FS (SUTURE) IMPLANT
SUT SILK 4 0 PS 2 (SUTURE) IMPLANT
SUT STEEL 3 0 (SUTURE) IMPLANT
SUT STEEL 4 0 V 26 (SUTURE) IMPLANT
SUT VIC AB 3-0 PS1 18 (SUTURE)
SUT VIC AB 3-0 PS1 18XBRD (SUTURE) IMPLANT
SUT VIC AB 4-0 P-3 18XBRD (SUTURE) IMPLANT
SUT VIC AB 4-0 P3 18 (SUTURE)
SUT VICRYL 4-0 PS2 18IN ABS (SUTURE) IMPLANT
SUT VICRYL RAPID 5 0 P 3 (SUTURE) IMPLANT
SUT VICRYL RAPIDE 4/0 PS 2 (SUTURE) ×3 IMPLANT
SUTURE FIBERWR 2-0 18 17.9 3/8 (SUTURE) IMPLANT
SUTURE FIBERWR 4-0 18 TAPR NDL (SUTURE) IMPLANT
SYR BULB 3OZ (MISCELLANEOUS) ×3 IMPLANT
SYR CONTROL 10ML LL (SYRINGE) ×3 IMPLANT
TOWEL OR 17X24 6PK STRL BLUE (TOWEL DISPOSABLE) ×6 IMPLANT
TUBE FEEDING 5FR 15 INCH (TUBING) IMPLANT
UNDERPAD 30X30 INCONTINENT (UNDERPADS AND DIAPERS) ×3 IMPLANT
WATER STERILE IRR 1000ML POUR (IV SOLUTION) ×3 IMPLANT

## 2012-09-22 NOTE — Anesthesia Preprocedure Evaluation (Signed)
Anesthesia Evaluation  Patient identified by MRN, date of birth, ID band Patient awake    Reviewed: Allergy & Precautions, H&P , NPO status , Patient's Chart, lab work & pertinent test results  Airway Mallampati: II TM Distance: >3 FB Neck ROM: Full    Dental  (+) Teeth Intact and Dental Advisory Given   Pulmonary  breath sounds clear to auscultation        Cardiovascular hypertension, Pt. on medications Rhythm:Regular Rate:Normal     Neuro/Psych    GI/Hepatic   Endo/Other    Renal/GU      Musculoskeletal   Abdominal   Peds  Hematology   Anesthesia Other Findings   Reproductive/Obstetrics                           Anesthesia Physical Anesthesia Plan  ASA: III  Anesthesia Plan: General   Post-op Pain Management:    Induction: Intravenous  Airway Management Planned: LMA  Additional Equipment:   Intra-op Plan:   Post-operative Plan: Extubation in OR  Informed Consent: I have reviewed the patients History and Physical, chart, labs and discussed the procedure including the risks, benefits and alternatives for the proposed anesthesia with the patient or authorized representative who has indicated his/her understanding and acceptance.   Dental advisory given  Plan Discussed with: CRNA, Anesthesiologist and Surgeon  Anesthesia Plan Comments:         Anesthesia Quick Evaluation  

## 2012-09-22 NOTE — Transfer of Care (Signed)
Immediate Anesthesia Transfer of Care Note  Patient: Amanda Mendez  Procedure(s) Performed: Procedure(s) (LRB) with comments: EVACUATION HEMATOMA (Right) - Evacuation Hematoma right hand  Patient Location: PACU  Anesthesia Type: General  Level of Consciousness: awake, alert , oriented and patient cooperative  Airway & Oxygen Therapy: Patient Spontanous Breathing and Patient connected to face mask oxygen  Post-op Assessment: Report given to PACU RN and Post -op Vital signs reviewed and stable  Post vital signs: Reviewed and stable  Complications: No apparent anesthesia complications

## 2012-09-22 NOTE — Brief Op Note (Signed)
09/22/2012  2:57 PM  PATIENT:  Amanda Mendez  56 y.o. female  PRE-OPERATIVE DIAGNOSIS:  hematoma right hand  POST-OPERATIVE DIAGNOSIS:  hematoma right hand  PROCEDURE:  Procedure(s) (LRB) with comments: EVACUATION HEMATOMA (Right) - Evacuation Hematoma right hand  SURGEON:  Surgeon(s) and Role:    * Nicki Reaper, MD - Primary  PHYSICIAN ASSISTANT:   ASSISTANTS: none   ANESTHESIA:   general  EBL:  Total I/O In: 800 [I.V.:800] Out: -   BLOOD ADMINISTERED:none  DRAINS: Penrose drain in the rt hand   LOCAL MEDICATIONS USED:  NONE  SPECIMEN:  No Specimen  DISPOSITION OF SPECIMEN:  N/A  COUNTS:  YES  TOURNIQUET:   Total Tourniquet Time Documented: Upper Arm (Right) - 22 minutes  DICTATION: .Other Dictation: Dictation Number [id number]  PLAN OF CARE: Discharge to home after PACU  PATIENT DISPOSITION:  PACU - hemodynamically stable.

## 2012-09-22 NOTE — Op Note (Signed)
Dictation Number [id number]

## 2012-09-22 NOTE — Anesthesia Postprocedure Evaluation (Signed)
  Anesthesia Post-op Note  Patient: Amanda Mendez  Procedure(s) Performed: Procedure(s) (LRB) with comments: EVACUATION HEMATOMA (Right) - Evacuation Hematoma right hand  Patient Location: PACU  Anesthesia Type: General  Level of Consciousness: awake, alert  and oriented  Airway and Oxygen Therapy: Patient Spontanous Breathing and Patient connected to face mask oxygen  Post-op Pain: moderate  Post-op Assessment: Post-op Vital signs reviewed  Post-op Vital Signs: Reviewed  Complications: No apparent anesthesia complications
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
## 2012-09-23 ENCOUNTER — Encounter (HOSPITAL_BASED_OUTPATIENT_CLINIC_OR_DEPARTMENT_OTHER): Payer: Self-pay | Admitting: Orthopedic Surgery
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
## 2012-09-27 LAB — ANAEROBIC CULTURE

## 2012-10-09 ENCOUNTER — Encounter: Payer: Self-pay | Admitting: Cardiology

## 2012-10-29 ENCOUNTER — Ambulatory Visit: Payer: BC Managed Care – PPO | Admitting: Cardiology

## 2012-11-03 ENCOUNTER — Emergency Department (HOSPITAL_COMMUNITY): Payer: BC Managed Care – PPO

## 2012-11-03 ENCOUNTER — Encounter (HOSPITAL_COMMUNITY): Payer: Self-pay | Admitting: General Practice

## 2012-11-03 ENCOUNTER — Observation Stay (HOSPITAL_COMMUNITY)
Admission: EM | Admit: 2012-11-03 | Discharge: 2012-11-03 | Disposition: A | Discharge status: Home / Self Care | Payer: BC Managed Care – PPO | Attending: Internal Medicine | Admitting: Internal Medicine

## 2012-11-03 ENCOUNTER — Encounter (HOSPITAL_COMMUNITY)
Admission: EM | Disposition: A | Discharge status: Home / Self Care | Payer: Self-pay | Source: Home / Self Care | Attending: Emergency Medicine

## 2012-11-03 DIAGNOSIS — I209 Angina pectoris, unspecified: Secondary | ICD-10-CM | POA: Insufficient documentation

## 2012-11-03 DIAGNOSIS — IMO0001 Reserved for inherently not codable concepts without codable children: Secondary | ICD-10-CM

## 2012-11-03 DIAGNOSIS — E119 Type 2 diabetes mellitus without complications: Secondary | ICD-10-CM | POA: Insufficient documentation

## 2012-11-03 DIAGNOSIS — I200 Unstable angina: Secondary | ICD-10-CM

## 2012-11-03 DIAGNOSIS — I252 Old myocardial infarction: Secondary | ICD-10-CM | POA: Insufficient documentation

## 2012-11-03 DIAGNOSIS — IMO0002 Reserved for concepts with insufficient information to code with codable children: Secondary | ICD-10-CM

## 2012-11-03 DIAGNOSIS — E1165 Type 2 diabetes mellitus with hyperglycemia: Secondary | ICD-10-CM

## 2012-11-03 DIAGNOSIS — Z87891 Personal history of nicotine dependence: Secondary | ICD-10-CM | POA: Insufficient documentation

## 2012-11-03 DIAGNOSIS — I2510 Atherosclerotic heart disease of native coronary artery without angina pectoris: Principal | ICD-10-CM

## 2012-11-03 DIAGNOSIS — I5031 Acute diastolic (congestive) heart failure: Secondary | ICD-10-CM

## 2012-11-03 DIAGNOSIS — E785 Hyperlipidemia, unspecified: Secondary | ICD-10-CM | POA: Insufficient documentation

## 2012-11-03 DIAGNOSIS — R0602 Shortness of breath: Secondary | ICD-10-CM | POA: Insufficient documentation

## 2012-11-03 DIAGNOSIS — I10 Essential (primary) hypertension: Secondary | ICD-10-CM | POA: Insufficient documentation

## 2012-11-03 HISTORY — DX: Dyspnea, unspecified: R06.00

## 2012-11-03 HISTORY — DX: Calculus of kidney: N20.0

## 2012-11-03 HISTORY — DX: Type 2 diabetes mellitus without complications: E11.9

## 2012-11-03 HISTORY — PX: LEFT HEART CATHETERIZATION WITH CORONARY ANGIOGRAM: SHX5451

## 2012-11-03 HISTORY — DX: Angina pectoris, unspecified: I20.9

## 2012-11-03 HISTORY — DX: Other forms of dyspnea: R06.09

## 2012-11-03 HISTORY — DX: Headache, unspecified: R51.9

## 2012-11-03 HISTORY — DX: Headache: R51

## 2012-11-03 HISTORY — DX: Gastro-esophageal reflux disease without esophagitis: K21.9

## 2012-11-03 HISTORY — DX: Pneumonia, unspecified organism: J18.9

## 2012-11-03 LAB — BASIC METABOLIC PANEL
Chloride: 93 mEq/L — ABNORMAL LOW (ref 96–112)
Creatinine, Ser: 0.68 mg/dL (ref 0.50–1.10)
GFR calc Af Amer: >90 mL/min (ref >90)
GFR calc non Af Amer: >90 mL/min (ref >90)
Potassium: 3.4 mEq/L — ABNORMAL LOW (ref 3.5–5.1)

## 2012-11-03 LAB — CBC
MCHC: 34.4 g/dL (ref 30.0–36.0)
Platelets: 366 10*3/uL (ref 150–400)
RDW: 12.5 % (ref 11.5–15.5)
WBC: 8.9 10*3/uL (ref 4.0–10.5)

## 2012-11-03 LAB — PRO B NATRIURETIC PEPTIDE: Pro B Natriuretic peptide (BNP): 45.9 pg/mL (ref 0–125)

## 2012-11-03 LAB — POCT I-STAT TROPONIN I

## 2012-11-03 IMAGING — CR DG CHEST 2V
2 series · 2 of 2 positions shown · non-contrast
Comparison: [DATE].

CLINICAL DATA: Chest pain.

CHEST - 2 VIEW

[w chest pa]
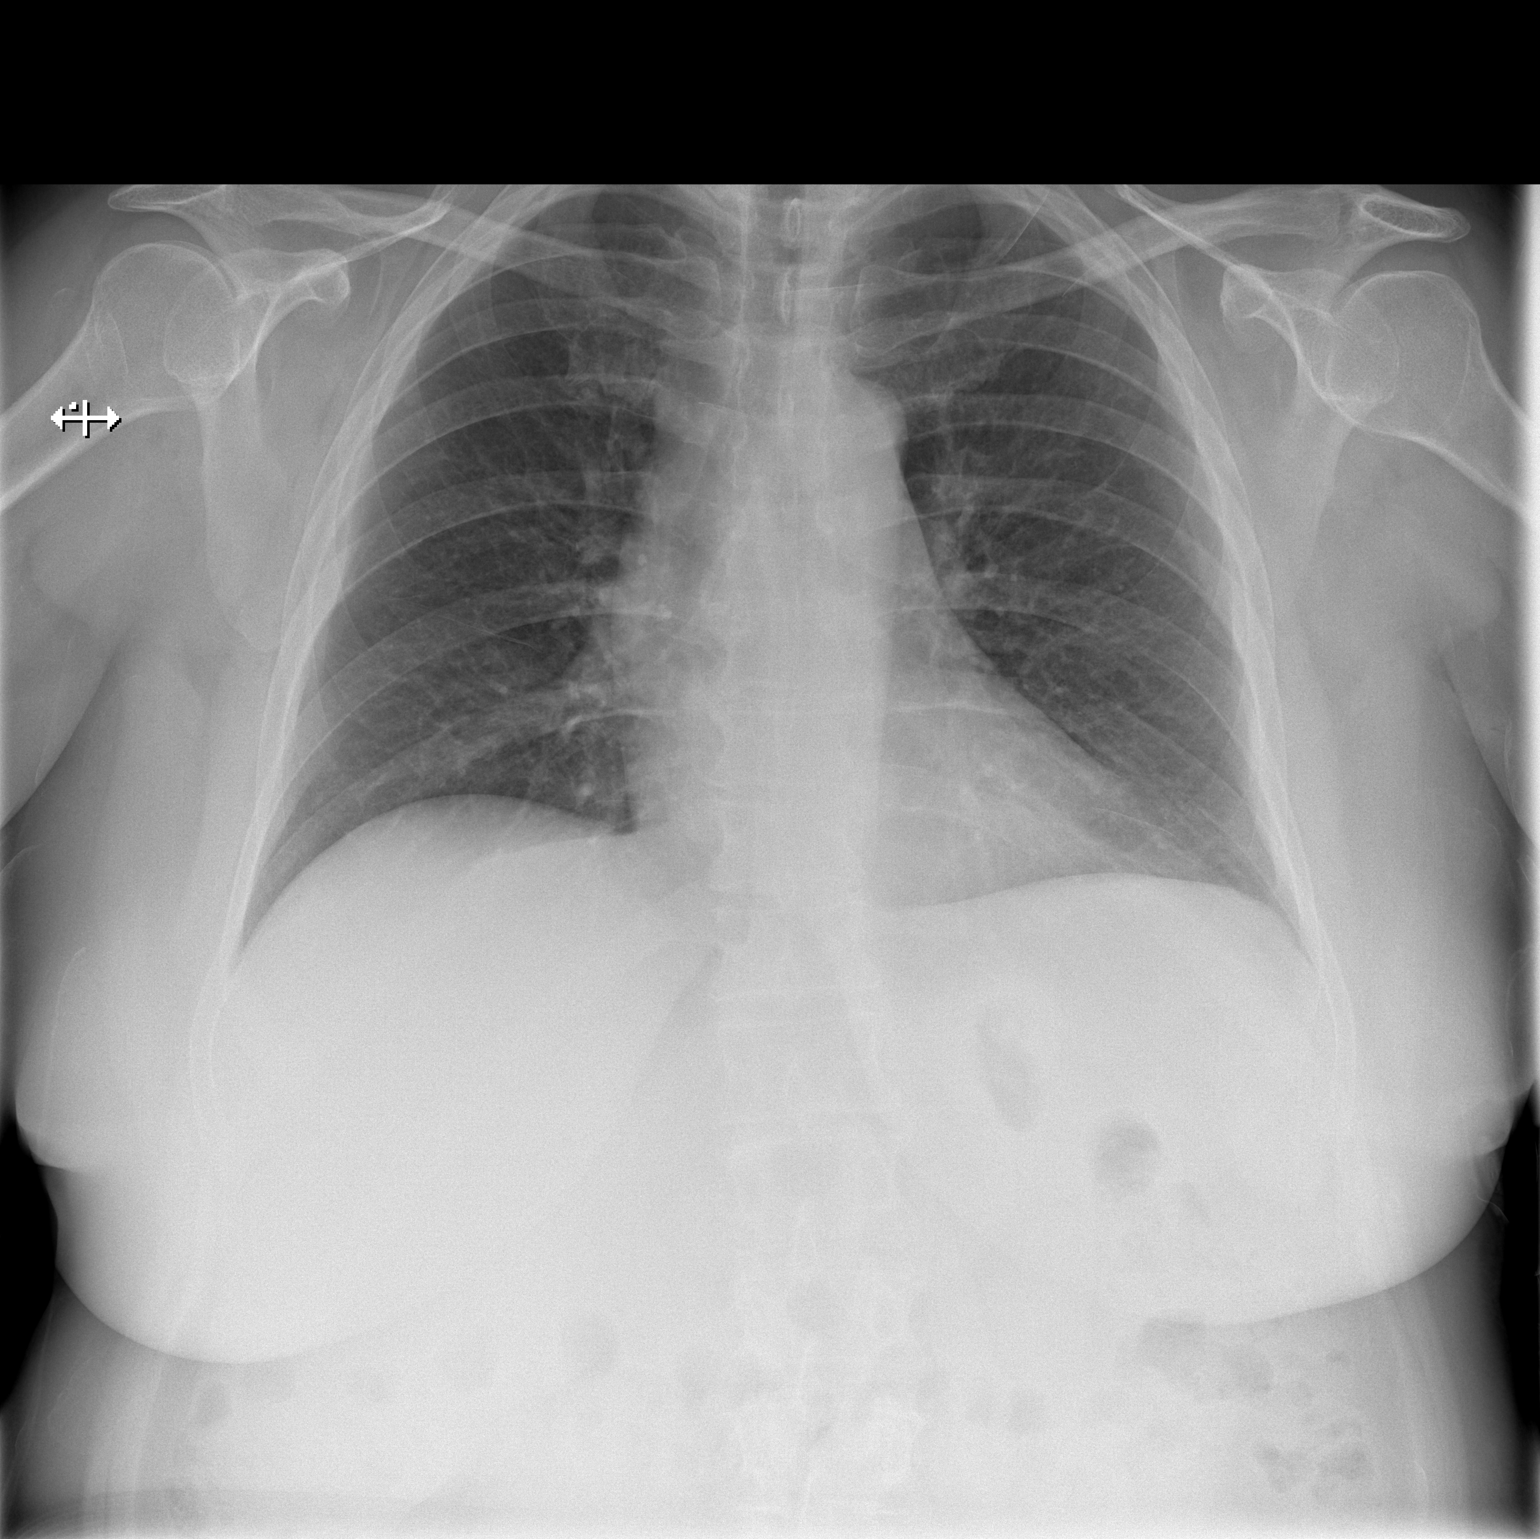

[w chest lat]
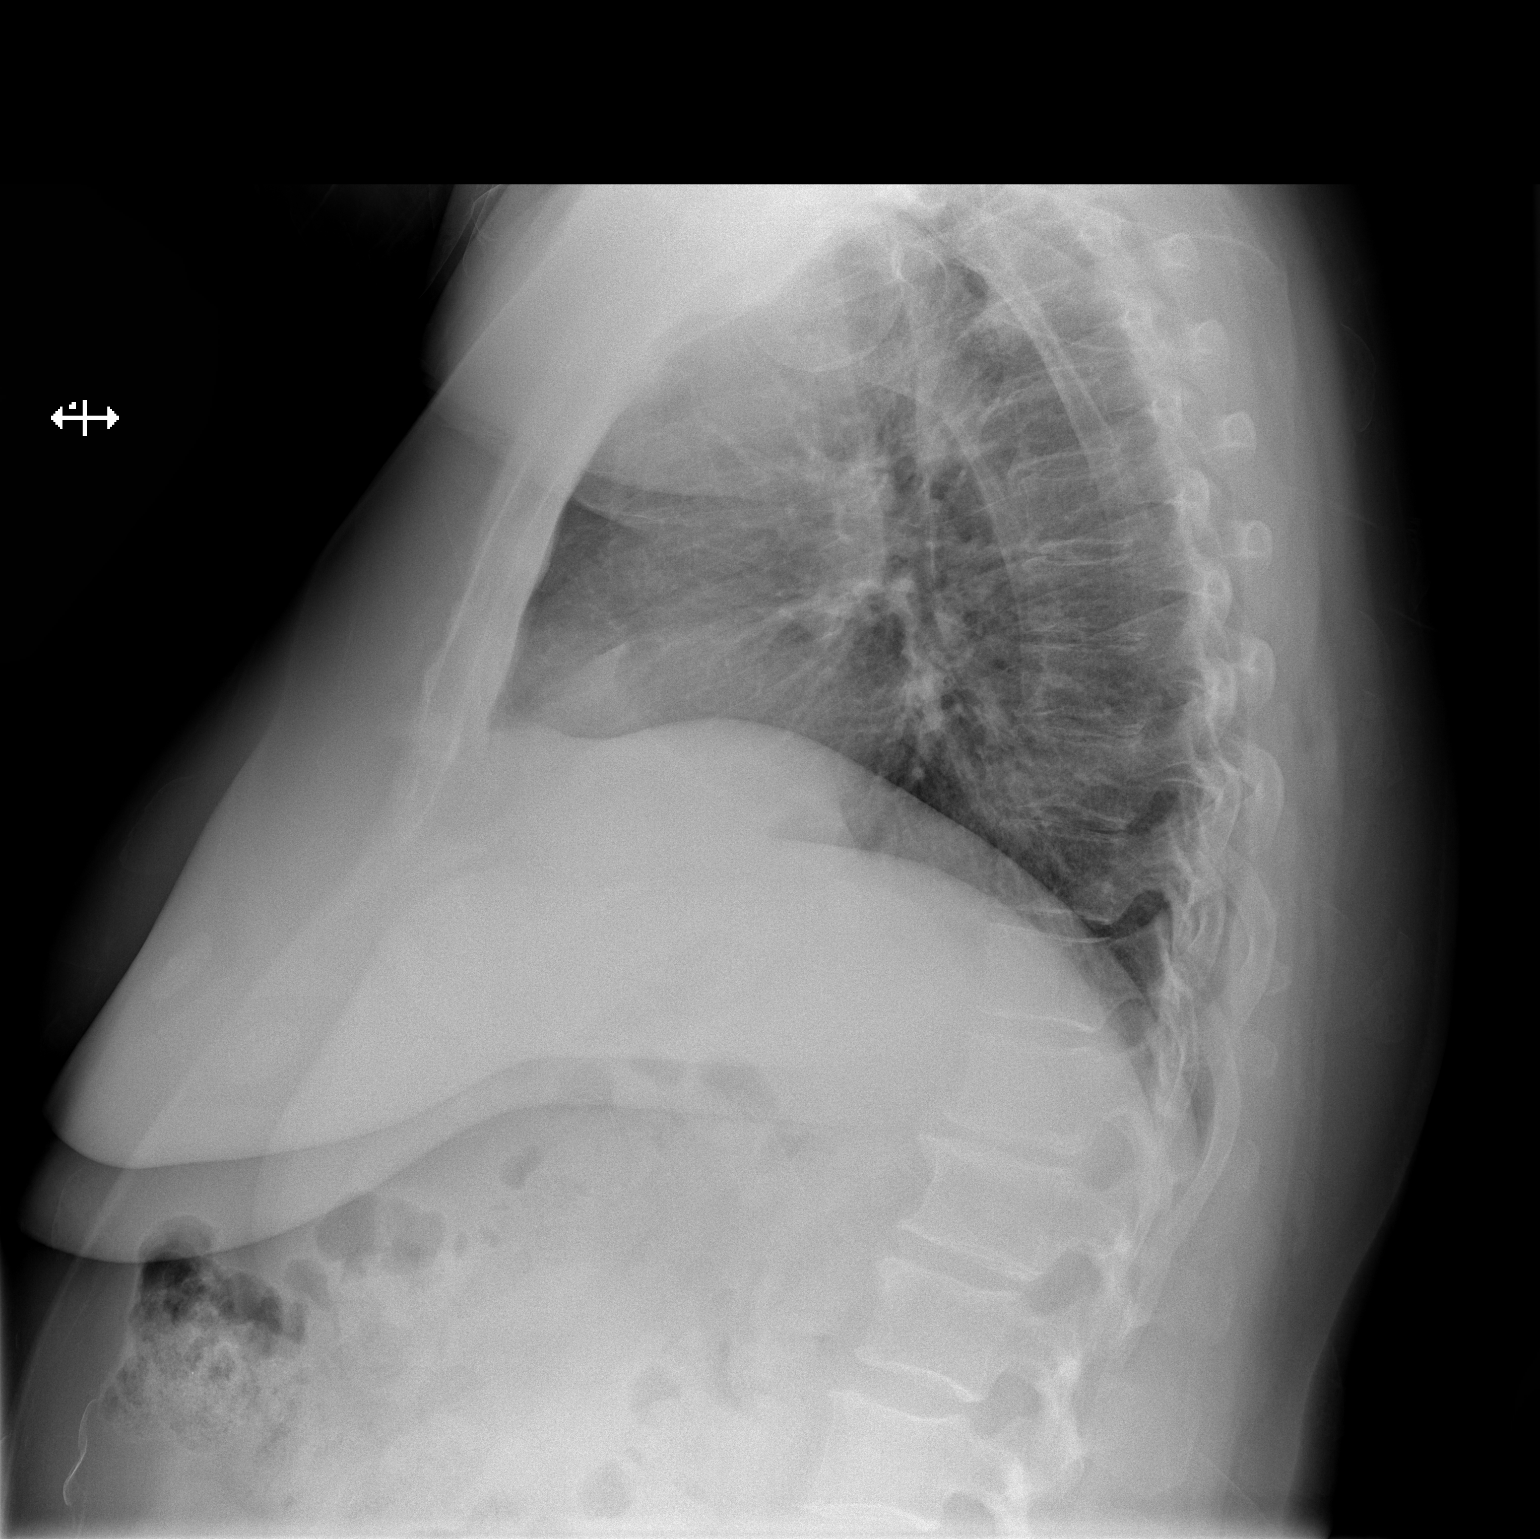

[2 of 2 positions shown; findings below may reference images not displayed]

FINDINGS: Cardiomediastinal silhouette appears normal.  No acute
pulmonary disease is noted.  Bony thorax is intact.
IMPRESSION: No acute cardiopulmonary abnormality seen.

## 2012-11-03 SURGERY — LEFT HEART CATHETERIZATION WITH CORONARY ANGIOGRAM
Anesthesia: LOCAL

## 2012-11-03 MED ORDER — FENTANYL CITRATE 0.05 MG/ML IJ SOLN
INTRAMUSCULAR | Status: AC
  Filled 2012-11-03: qty 2

## 2012-11-03 MED ORDER — SODIUM CHLORIDE 0.9 % IV SOLN: INTRAVENOUS | Status: DC

## 2012-11-03 MED ORDER — LIDOCAINE HCL (PF) 1 % IJ SOLN
INTRAMUSCULAR | Status: AC
  Filled 2012-11-03: qty 30

## 2012-11-03 MED ORDER — METOPROLOL TARTRATE 50 MG PO TABS
50.0000 mg | ORAL_TABLET | Freq: Two times a day (BID) | ORAL | Status: DC
  Filled 2012-11-03: qty 1

## 2012-11-03 MED ORDER — NITROGLYCERIN 0.4 MG SL SUBL: 0.4000 mg | SUBLINGUAL_TABLET | SUBLINGUAL | Status: DC | PRN

## 2012-11-03 MED ORDER — CLOPIDOGREL BISULFATE 75 MG PO TABS: 75.0000 mg | ORAL_TABLET | Freq: Every day | ORAL | Status: DC

## 2012-11-03 MED ORDER — PENTAZOCINE-NALOXONE 50-0.5 MG PO TABS
1.0000 | ORAL_TABLET | ORAL | Status: DC | PRN
  Filled 2012-11-03: qty 1

## 2012-11-03 MED ORDER — ONDANSETRON HCL 4 MG/2ML IJ SOLN
4.0000 mg | Freq: Four times a day (QID) | INTRAMUSCULAR | Status: DC | PRN
  Filled 2012-11-03: qty 2

## 2012-11-03 MED ORDER — SODIUM CHLORIDE 0.9 % IV BOLUS (SEPSIS): 1000.0000 mL | Freq: Once | INTRAVENOUS | Status: DC

## 2012-11-03 MED ORDER — LOSARTAN POTASSIUM-HCTZ 100-25 MG PO TABS: 1.0000 | ORAL_TABLET | Freq: Every evening | ORAL | Status: DC

## 2012-11-03 MED ORDER — PANTOPRAZOLE SODIUM 40 MG PO TBEC: 40.0000 mg | DELAYED_RELEASE_TABLET | Freq: Every day | ORAL | Status: DC

## 2012-11-03 MED ORDER — INSULIN ASPART 100 UNIT/ML ~~LOC~~ SOLN
7.0000 [IU] | Freq: Once | SUBCUTANEOUS | Status: AC
  Administered 2012-11-03: 21:00:00 7 [IU] via SUBCUTANEOUS

## 2012-11-03 MED ORDER — LOSARTAN POTASSIUM 50 MG PO TABS
100.0000 mg | ORAL_TABLET | Freq: Every day | ORAL | Status: DC
  Filled 2012-11-03: qty 2

## 2012-11-03 MED ORDER — HEPARIN SODIUM (PORCINE) 1000 UNIT/ML IJ SOLN
INTRAMUSCULAR | Status: AC
  Filled 2012-11-03: qty 1

## 2012-11-03 MED ORDER — MORPHINE SULFATE 4 MG/ML IJ SOLN
4.0000 mg | Freq: Once | INTRAMUSCULAR | Status: AC
  Administered 2012-11-03: 4 mg via INTRAVENOUS
  Filled 2012-11-03: qty 1

## 2012-11-03 MED ORDER — HYDROCHLOROTHIAZIDE 25 MG PO TABS
25.0000 mg | ORAL_TABLET | Freq: Every day | ORAL | Status: DC
  Filled 2012-11-03: qty 1

## 2012-11-03 MED ORDER — HEPARIN (PORCINE) IN NACL 2-0.9 UNIT/ML-% IJ SOLN
INTRAMUSCULAR | Status: AC
  Filled 2012-11-03: qty 1000

## 2012-11-03 MED ORDER — NITROGLYCERIN 0.2 MG/ML ON CALL CATH LAB
INTRAVENOUS | Status: AC
  Filled 2012-11-03: qty 1

## 2012-11-03 MED ORDER — ATORVASTATIN CALCIUM 40 MG PO TABS
40.0000 mg | ORAL_TABLET | Freq: Every day | ORAL | Status: DC
  Filled 2012-11-03: qty 1

## 2012-11-03 MED ORDER — ZOLPIDEM TARTRATE 5 MG PO TABS: 5.0000 mg | ORAL_TABLET | Freq: Every evening | ORAL | Status: DC | PRN

## 2012-11-03 MED ORDER — DIAZEPAM 5 MG PO TABS: 5.0000 mg | ORAL_TABLET | ORAL | Status: DC

## 2012-11-03 MED ORDER — ASPIRIN 81 MG PO CHEW
324.0000 mg | CHEWABLE_TABLET | Freq: Once | ORAL | Status: AC
  Administered 2012-11-03: 324 mg via ORAL
  Filled 2012-11-03: qty 4

## 2012-11-03 MED ORDER — NITROGLYCERIN IN D5W 200-5 MCG/ML-% IV SOLN
5.0000 ug/min | INTRAVENOUS | Status: DC
  Administered 2012-11-03: 5 ug/min via INTRAVENOUS
  Filled 2012-11-03: qty 250

## 2012-11-03 MED ORDER — ONDANSETRON HCL 4 MG/2ML IJ SOLN: 4.0000 mg | Freq: Four times a day (QID) | INTRAMUSCULAR | Status: DC | PRN

## 2012-11-03 MED ORDER — ALPRAZOLAM 0.25 MG PO TABS: 0.2500 mg | ORAL_TABLET | Freq: Two times a day (BID) | ORAL | Status: DC | PRN

## 2012-11-03 MED ORDER — ACETAMINOPHEN 325 MG PO TABS: 650.0000 mg | ORAL_TABLET | ORAL | Status: DC | PRN

## 2012-11-03 MED ORDER — ASPIRIN 81 MG PO CHEW: 81.0000 mg | CHEWABLE_TABLET | Freq: Every day | ORAL | Status: DC

## 2012-11-03 MED ORDER — ASPIRIN 81 MG PO TABS: 81.0000 mg | ORAL_TABLET | Freq: Every day | ORAL | Status: DC

## 2012-11-03 MED ORDER — SODIUM CHLORIDE 0.9 % IV SOLN
INTRAVENOUS | Status: AC
  Administered 2012-11-03: 22:00:00 via INTRAVENOUS

## 2012-11-03 MED ORDER — MIDAZOLAM HCL 2 MG/2ML IJ SOLN
INTRAMUSCULAR | Status: AC
  Filled 2012-11-03: qty 2

## 2012-11-03 MED ORDER — VERAPAMIL HCL 2.5 MG/ML IV SOLN
INTRAVENOUS | Status: AC
  Filled 2012-11-03: qty 2

## 2012-11-03 NOTE — CV Procedure (Addendum)
Cardiac Cath Procedure Note:  Indication:   Procedures performed:  1) Selective coronary angiography 2) Left heart catheterization 3) Left ventriculogram  Description of procedure:   The risks and indication of the procedure were explained. Consent was signed and placed on the chart. An appropriate timeout was taken prior to the procedure. After a normal Allen's test was confirmed, the right wrist was prepped and draped in the routine sterile fashion and anesthetized with 1% local lidocaine.   A 5 FR arterial sheath was then placed in the right radial artery using a modified Seldinger technique. Systemic heparin was administered. 3mg  IV verapamil was given through the sheath. Standard catheters including a JL 3.5, AR-1 and straight pigtail were used. All catheter exchanges were made over a wire.  Complications:  None apparent  Findings:  Ao Pressure: 129/80 (102) LV Pressure: 128/7/12 There was no signficant gradient across the aortic valve on pullback.  Coronary dominance: co dominant   Left mainstem: Normal   Left anterior descending (LAD): Large wrapping the apex. 30% proximal at takeoff of large diagonal.  In the diagonal there is proximal 25%   Left circumflex (LCx): Co dominant. AV large and normal. OM large and branching with proximal stent which is widely patent. There is a small marginal branch that comes off at the distal end of the stent which has a 30-40% lesion in the ostium. PDA large and normal   Right coronary artery (RCA): Small to moderate with a high anterior take off. Mild luminal irregularities. Mid 70-75%. PDA small and normal   Left ventriculography: Left ventricular systolic function is normal, LVEF is estimated at 60-65%, there is no significant mitral regurgitation     Assessment: 1. CAD with patent OM stent and stable 70-75% in RCA 2. Normal LV function  Plan/Discussion:  Films reviewed with Dr. Excell Seltzer. Lesion in RCA is borderline but stable from  last cath. Doubt this is a source of resting CP but may be potential source of exertional angina. Resting pain likely to be more GI. Will continue to treat CAD medically. Start protonix 40 daily and refer to GI. If CP persists, suggest outpatient Myoview to further evaluate significance of RCA disease.   Amanda Mendez 7:31 PM

## 2012-11-03 NOTE — ED Notes (Addendum)
PT was here in hospital with husband who is getting assessed for VAD and started having chest pain that was similar to when she had a heart attack..  No nausea.

## 2012-11-03 NOTE — ED Notes (Signed)
Consent for Cardiac Cath signed by patient.

## 2012-11-03 NOTE — H&P (Signed)
HPI:  Amanda Mendez is a 55 y/o woman with a history of obesity, hypertension, diabetes, hyperlipidemia, tobacco use recently quit in July of 2013 and CAD with LCX stent in 9/13 who presents with unstable angina.  She presented to the hospital in September 2013 with chest pain concerning for unstable angina. She underwent right and left heart catheterization by Dr. Antoine Poche. Results as follows  Left main: Normal LAD: Normal with 25% lesion in a large diagonal Left circumflex: Codominant vessel with an 80% lesion in a large branching OM Right coronary: Codominant. Small to moderate vessel with high anterior takeoff mid 60% lesion LV gram: EF 55-65%.  She subsequently underwent placement of a drug-eluting stent to her OM branch.  Since that time she complains of chronic exertional dyspnea. She saw Dr. Antoine Poche at the end of September and was stable. Over the past few weeks she has noticed that she has chest pain when she eats and feels food gets stuck in her distal esophagus. Today she came to the heart failure clinic with her husband for his social worker visit as part of his heart transplant evaluation. While there she developed 9/10 chest pain radiating to her back which he felt was similar to her previous angina. She was emergently transported to the ER. EKG showed no acute changes. First troponin was normal. She was made pain free with IV nitroglycerin. She is now comfortable. She does have mild reflux symptoms. She denies any abdominal pain. She has not had any nausea vomiting or melena. She has been compliant with her Plavix.    Review of Systems:     Cardiac Review of Systems: {Y] = yes [ ]  = no  Chest Pain [ Y.   ]  Resting SOB [   ] Exertional SOB  [  Y.]  Orthopnea [  ]   Pedal Edema [   ]    Palpitations [  ] Syncope  [  ]   Presyncope [   ]  General Review of Systems: [Y] = yes [  ]=no Constitional: recent weight change [  ]; anorexia [  ]; fatigue [ Y. ]; nausea [  ]; night  sweats [  ]; fever [  ]; or chills [  ];                                                                                                                                           Eye : blurred vision [  ]; diplopia [   ]; vision changes [  ];  Amaurosis fugax[  ]; Resp: cough [  ];  wheezing[  ];  hemoptysis[  ]; shortness of breath[  ]; paroxysmal nocturnal dyspnea[  ]; dyspnea on exertion[Y.  ]; or orthopnea[  ];  GI:  gallstones[  ], vomiting[  ];  dysphagia[Y.  ]; melena[  ];  hematochezia [  ];  heartburn[ Y. ];  GU: kidney stones [  ]; hematuria[  ];   dysuria [  ];  nocturia[  ];  history of     obstruction [  ];                 Skin: rash, swelling[  ];, hair loss[  ];  peripheral edema[  ];  or itching[  ]; Musculosketetal: myalgias[ Y. ];  joint swelling[  ];  joint erythema[  ];  joint pain[ Y. ];  back pain[  ];  Heme/Lymph: bruising[  ];  bleeding[  ];  anemia[  ];  Neuro: TIA[  ];  headaches[  ];  stroke[  ];  vertigo[  ];  seizures[  ];   paresthesias[  ];  difficulty walking[  ];  Psych:depression[  ]; anxiety[  ];  Endocrine: diabetes[  Y.];  thyroid dysfunction[  ];   Past Medical History  Diagnosis Date  . Diabetes mellitus     Since age 34  . Hypertension   . Tobacco abuse     Smoked for 40 years  . Obesity (BMI 30-39.9)   . Hypercholesterolemia with hypertriglyceridemia   . Coronary artery disease     angioplasty 9/13  . Shortness of breath     Allergies  Allergen Reactions  . Codeine Nausea And Vomiting    But has tolerated morphine    History   Social History  . Marital Status: Married    Spouse Name: N/A    Number of Children: N/A  . Years of Education: N/A   Occupational History  . Not on file.   Social History Main Topics  . Smoking status: Former Smoker    Quit date: 06/11/2012  . Smokeless tobacco: Not on file     Comment: Smoked for 40 years. Quit 06/2012  . Alcohol Use: No  . Drug Use: No  . Sexually Active: Not Currently   Other  Topics Concern  . Not on file   Social History Narrative  . No narrative on file    Family History  Problem Relation Age of Onset  . Heart failure Mother     Died in her 47s, had angina starting in her 23s  . Crohn's disease Brother     PHYSICAL EXAM: Filed Vitals:   11/03/12 1557  BP: 139/82  Pulse: 88  Temp: 98.2 F (36.8 C)  Resp: 16   General:  Obese woman Well appearing. No respiratory difficulty HEENT: normal Neck: supple. no JVD. Carotids 2+ bilat; no bruits. No lymphadenopathy or thryomegaly appreciated. Cor: PMI nondisplaced. Regular rate & rhythm. No rubs, gallops or murmurs. Lungs: clear with moderately decreased breath sounds throughout no wheezing Abdomen: Obese soft, nontender, nondistended. No hepatosplenomegaly. No bruits or masses. Good bowel sounds. Extremities: no cyanosis, clubbing, rash, edema Neuro: alert & oriented x 3, cranial nerves grossly intact. moves all 4 extremities w/o difficulty. Affect pleasant.  ECG: Sinus rhythm with chronic anteroseptal Q waves no acute ST-T wave abnormalities  Results for orders placed during the hospital encounter of 11/03/12 (from the past 24 hour(s))  CBC     Status: Normal   Collection Time   11/03/12  3:43 PM      Component Value Range   WBC 8.9  4.0 - 10.5 K/uL   RBC 4.53  3.87 - 5.11 MIL/uL   Hemoglobin 14.5  12.0 - 15.0 g/dL   HCT 62.1  30.8 - 65.7 %   MCV 93.2  78.0 -  100.0 fL   MCH 32.0  26.0 - 34.0 pg   MCHC 34.4  30.0 - 36.0 g/dL   RDW 16.1  09.6 - 04.5 %   Platelets 366  150 - 400 K/uL  BASIC METABOLIC PANEL     Status: Abnormal   Collection Time   11/03/12  3:43 PM      Component Value Range   Sodium 135  135 - 145 mEq/L   Potassium 3.4 (*) 3.5 - 5.1 mEq/L   Chloride 93 (*) 96 - 112 mEq/L   CO2 26  19 - 32 mEq/L   Glucose, Bld 205 (*) 70 - 99 mg/dL   BUN 13  6 - 23 mg/dL   Creatinine, Ser 4.09  0.50 - 1.10 mg/dL   Calcium 81.1  8.4 - 91.4 mg/dL   GFR calc non Af Amer >90  >90 mL/min   GFR  calc Af Amer >90  >90 mL/min  POCT I-STAT TROPONIN I     Status: Normal   Collection Time   11/03/12  4:09 PM      Component Value Range   Troponin i, poc 0.00  0.00 - 0.08 ng/mL   Comment 3           PRO B NATRIURETIC PEPTIDE     Status: Normal   Collection Time   11/03/12  4:22 PM      Component Value Range   Pro B Natriuretic peptide (BNP) 45.9  0 - 125 pg/mL  TROPONIN I     Status: Normal   Collection Time   11/03/12  4:22 PM      Component Value Range   Troponin I <0.30  <0.30 ng/mL   Dg Chest 2 View  11/03/2012  *RADIOLOGY REPORT*  Clinical Data: Chest pain.  CHEST - 2 VIEW  Comparison: August 13, 2012.  Findings: Cardiomediastinal silhouette appears normal.  No acute pulmonary disease is noted.  Bony thorax is intact.  IMPRESSION: No acute cardiopulmonary abnormality seen.   Original Report Authenticated By: Lupita Raider.,  M.D.      ASSESSMENT: 1. Chest pain concerning for unstable angina 2. CAD with recent drug-eluting stent to a large OM 3. Diabetes 4. COPD 5. Dysphagia 6. Hyperlipidemia 7. Hypertension  PLAN/DISCUSSION:  She has 2 types of chest pain. One of these seems more gastrointestinal in nature and is suggestive of possible reflux disease with an esophageal stricture. However the pain she had today was different in quality and appears much more like her anginal pain. Her EKG and first set of cardiac markers are normal. Her pain was relieved with nitroglycerin. Given her recent stent and the severity of her chest pain I think is important that we exclude stent occlusion particularly as her husband is in the midst of a transplant workup for which she'll need to be available.   We discussed the options and we decided to proceed with cardiac catheterization this evening to evaluate her coronaries and recently placed stent. If her stent is patent then will start her on a proton pump inhibitor and refer her to GI for further evaluation.  Roger Kettles,MD 6:09 PM

## 2012-11-03 NOTE — ED Notes (Signed)
Pt began having left sided chest pain that radiates to left side of back that started about an hour ago. Pt states she has some sob. Hx of heart attack.

## 2012-11-03 NOTE — ED Provider Notes (Signed)
History     CSN: [id number]  Arrival date & time 11/03/12  1450   First MD Initiated Contact with Patient 11/03/12 1528      Chief Complaint  Patient presents with  . Chest Pain    (Consider location/radiation/quality/duration/timing/severity/associated sxs/prior treatment) HPI  56 year old female with history of MI, diabetes, and hypertension presents for evaluation of chest pain. Patient reports while she was with her husband in the hospital where she experiencing a gradual onset of left-sided chest pressure which radiates to her back.  Symptom has been persistent, with associated mild shortness of breath. She denies fever, chills, productive cough, nausea, vomiting, diarrhea, numbness or weakness. Patient states pain feels similar to chest pain that she has in the past where she was found to have a heart blockage. No DOE, no leg swelling or leg pain. Patient has history of Unstable Angina, s/p PTCA and 2.5 x 12 Xience drug-eluting stenting of the OM, which was performed in September.  No treatment tried.     Past Medical History  Diagnosis Date  . Diabetes mellitus     Since age 35  . Hypertension   . Tobacco abuse     Smoked for 40 years  . Obesity (BMI 30-39.9)   . Hypercholesterolemia with hypertriglyceridemia   . Coronary artery disease     angioplasty 9/13  . Shortness of breath     Past Surgical History  Procedure Date  . Tubal ligation   . Cardiac catheterization 9/13  . Hematoma evacuation 09/22/2012    Procedure: EVACUATION HEMATOMA;  Surgeon: Nicki Reaper, MD;  Location: [facility];  Service: Orthopedics;  Laterality: Right;  Evacuation Hematoma right hand    Family History  Problem Relation Age of Onset  . Heart failure Mother     Died in her 60s, had angina starting in her 27s  . Crohn's disease Brother     History  Substance Use Topics  . Smoking status: Former Smoker    Quit date: 06/11/2012  . Smokeless tobacco: Not on file   Comment: Smoked for 40 years. Quit 06/2012  . Alcohol Use: No    OB History    Grav Para Term Preterm Abortions TAB SAB Ect Mult Living                  Review of Systems  All other systems reviewed and are negative.    Allergies  Codeine  Home Medications   Current Outpatient Rx  Name  Route  Sig  Dispense  Refill  . ASPIRIN 81 MG PO TABS   Oral   Take 1 tablet (81 mg total) by mouth daily.   30 tablet      . ATORVASTATIN CALCIUM 40 MG PO TABS   Oral   Take 1 tablet (40 mg total) by mouth daily.   30 tablet   11   . CEPHALEXIN 250 MG PO CAPS   Oral   Take 1 capsule (250 mg total) by mouth 4 (four) times daily.   40 capsule   0   . CLOPIDOGREL BISULFATE 75 MG PO TABS   Oral   Take 1 tablet (75 mg total) by mouth daily.   30 tablet   11   . FUROSEMIDE 20 MG PO TABS   Oral   Take 1 tablet (20 mg total) by mouth as needed.   30 tablet   11   . LOSARTAN POTASSIUM-HCTZ 100-25 MG PO TABS   Oral  Take 1 tablet by mouth every evening.         Marland Kitchen METFORMIN HCL 500 MG PO TABS   Oral   Take 1 tablet (500 mg total) by mouth 2 (two) times daily with a meal. HOLD for 48 hours, restart on 08/17/2012.   60 tablet   11   . METOPROLOL TARTRATE 50 MG PO TABS   Oral   Take 50 mg by mouth 2 (two) times daily.         Marland Kitchen NITROGLYCERIN 0.4 MG SL SUBL   Sublingual   Place 1 tablet (0.4 mg total) under the tongue every 5 (five) minutes as needed for chest pain.   25 tablet   3   . PENTAZOCINE-NALOXONE 50-0.5 MG PO TABS   Oral   Take 1 tablet by mouth every 4 (four) hours as needed for pain.   30 tablet   0     BP 145/72  Pulse 84  Temp 97.5 F (36.4 C)  Resp 24  SpO2 98%  Physical Exam  Nursing note and vitals reviewed. Constitutional: She appears well-developed and well-nourished. No distress.       Awake, alert, nontoxic appearance  HENT:  Head: Atraumatic.  Eyes: Conjunctivae normal are normal. Right eye exhibits no discharge. Left eye exhibits  no discharge.  Neck: Neck supple.  Cardiovascular: Normal rate and regular rhythm.   Pulmonary/Chest: Effort normal. No respiratory distress. She exhibits no tenderness.  Abdominal: Soft. There is no tenderness. There is no rebound.  Musculoskeletal: She exhibits no edema and no tenderness.       ROM appears intact, no obvious focal weakness  Neurological:       Mental status and motor strength appears intact  Skin: No rash noted.  Psychiatric: She has a normal mood and affect.    ED Course  Procedures (including critical care time)   Labs Reviewed  CBC  BASIC METABOLIC PANEL   No results found.   No diagnosis found.   Date: 11/03/2012  Rate: 84  Rhythm: sinus arrhythmia  QRS Axis: normal  Intervals: QT prolonged  ST/T Wave abnormalities: nonspecific ST/T changes, anterior infarct, age determine  Conduction Disutrbances:none  Narrative Interpretation:   Old EKG Reviewed: unchanged  Results for orders placed during the hospital encounter of 11/03/12  CBC      Component Value Range   WBC 8.9  4.0 - 10.5 K/uL   RBC 4.53  3.87 - 5.11 MIL/uL   Hemoglobin 14.5  12.0 - 15.0 g/dL   HCT 16.1  09.6 - 04.5 %   MCV 93.2  78.0 - 100.0 fL   MCH 32.0  26.0 - 34.0 pg   MCHC 34.4  30.0 - 36.0 g/dL   RDW 40.9  81.1 - 91.4 %   Platelets 366  150 - 400 K/uL  BASIC METABOLIC PANEL      Component Value Range   Sodium 135  135 - 145 mEq/L   Potassium 3.4 (*) 3.5 - 5.1 mEq/L   Chloride 93 (*) 96 - 112 mEq/L   CO2 26  19 - 32 mEq/L   Glucose, Bld 205 (*) 70 - 99 mg/dL   BUN 13  6 - 23 mg/dL   Creatinine, Ser 7.82  0.50 - 1.10 mg/dL   Calcium 95.6  8.4 - 21.3 mg/dL   GFR calc non Af Amer >90  >90 mL/min   GFR calc Af Amer >90  >90 mL/min  PRO B NATRIURETIC PEPTIDE  Component Value Range   Pro B Natriuretic peptide (BNP) 45.9  0 - 125 pg/mL  TROPONIN I      Component Value Range   Troponin I <0.30  <0.30 ng/mL  POCT I-STAT TROPONIN I      Component Value Range   Troponin  i, poc 0.00  0.00 - 0.08 ng/mL   Comment 3            Dg Chest 2 View  11/03/2012  *RADIOLOGY REPORT*  Clinical Data: Chest pain.  CHEST - 2 VIEW  Comparison: August 13, 2012.  Findings: Cardiomediastinal silhouette appears normal.  No acute pulmonary disease is noted.  Bony thorax is intact.  IMPRESSION: No acute cardiopulmonary abnormality seen.   Original Report Authenticated By: Lupita Raider.,  M.D.    1. Unstable angina   MDM  Patient with history of unstable angina, status post cardiac stenting presents with ongoing chest pain for the past one hour. Workup initiated.    4:40 PM ASA, nitro drips, Morphine and O2 started as this may be unstable angina.  Cardiology was paged.     5:21 PM Pt sts sxs improves from a 6/10 to 3/10 after receiving treatment.  Cardiology has not call back yet.  Cardiology have been re-paged.    5:30 PM [facility] has return the page and agrees to see pt in ED for further care.  Pt currently stable.    5:42 PM Pt has been seen and evaluated by Dr. Jones Broom, who will schedule for emergent catherization today for further management.  Pt is aware of plan.    CRITICAL CARE Performed by: Fayrene Helper   Total critical care time: 30 min  Critical care time was exclusive of separately billable procedures and treating other patients.  Critical care was necessary to treat or prevent imminent or life-threatening deterioration.  Critical care was time spent personally by me on the following activities: development of treatment plan with patient and/or surrogate as well as nursing, discussions with consultants, evaluation of patient's response to treatment, examination of patient, obtaining history from patient or surrogate, ordering and performing treatments and interventions, ordering and review of laboratory studies, ordering and review of radiographic studies, pulse oximetry and re-evaluation of patient's condition.  BP 139/82  Pulse 88  Temp  98.2 F (36.8 C) (Oral)  Resp 16  SpO2 98%  I have reviewed nursing notes and vital signs. I personally reviewed the imaging tests through PACS system  I reviewed available ER/hospitalization records thought the EMR  Fayrene Helper, [place] 11/03/12 1756

## 2012-11-03 NOTE — ED Provider Notes (Signed)
Amanda Mendez is a 56 y.o. female who states she began having chest pain 1 hour prior to arrival. The pain is in the left anterior chest radiates to the left upper back. It is worse with deep breathing. No associated diaphoresis, or nausea. She has not had aspirin since last evening. She had a cardiac stent 3 months ago. She does not smoke, currently.  Patient is alert and appears calm. She claims to have 6/10, left chest pain. I asked the nurse, to give the patient aspirin immediately (16:40). Will start NTG drip, and contact cardiology, re: Heparin.    17:30- CP 3/6 now. Patient is calm. She feels that the medication has helped. Cardiology return the page a few minutes ago. They will see the patient.  DX: unstable angina   CRITICAL CARE Performed by: Mancel Bale L   Total critical care time: 35 minutes  Critical care time was exclusive of separately billable procedures and treating other patients.  Critical care was necessary to treat or prevent imminent or life-threatening deterioration.  Critical care was time spent personally by me on the following activities: development of treatment plan with patient and/or surrogate as well as nursing, discussions with consultants, evaluation of patient's response to treatment, examination of patient, obtaining history from patient or surrogate, ordering and performing treatments and interventions, ordering and review of laboratory studies, ordering and review of radiographic studies, pulse oximetry and re-evaluation of patient's condition.     Medical screening examination/treatment/procedure(s) were conducted as a shared visit with non-physician practitioner(s) and myself.  I personally evaluated the patient during the encounter     Flint Melter, MD 11/03/12 2322

## 2012-11-03 NOTE — ED Notes (Signed)
Pt in xray

## 2012-11-03 NOTE — ED Notes (Signed)
[place] at bedside 

## 2012-11-04 ENCOUNTER — Telehealth: Payer: Self-pay | Admitting: *Deleted

## 2012-11-04 DIAGNOSIS — R0789 Other chest pain: Secondary | ICD-10-CM

## 2012-11-04 NOTE — Discharge Summary (Signed)
CARDIOLOGY DISCHARGE SUMMARY   Patient ID: Amanda Mendez MRN: [id number] DOB/AGE: [birth year] 56 y.o.  Admit date: 11/03/2012  Discharge date: 11/04/2012  Primary Discharge Diagnosis:  Chest pain - moderate CAD at cath with outpatient Myoview planned to assess ischemic burden of RCA and GI referral for possible GERD symptoms  Secondary Discharge Diagnosis:  . Hypertension   . Obesity (BMI 30-39.9)   . Hypercholesterolemia with hypertriglyceridemia   . Coronary artery disease     angioplasty 9/13  . Anginal pain   . Exertional dyspnea     "usually; today it was with nothing" (11/03/2012)  . Type II diabetes mellitus 2007  . GERD (gastroesophageal reflux disease)   . Sinus headache     "weekly" (11/03/2012)   Procedures:   1) Selective coronary angiography  2) Left heart catheterization  3) Left ventriculogram  Hospital Course:  Amanda Mendez is a 56 year old female with a history of coronary artery disease. She came to the heart failure clinic with her husband and developed chest pain. She was transferred urgently to the emergency room. Her ECG did not show ST elevation. However, she has known disease and there was concern for progression. She was taken to the cath lab on 11/03/2012. The full results are below.  Dr. Gala Romney evaluated the results and medical therapy was recommended, with an outpatient stress test to assess the significance of a borderline RCA lesion and a referral to GI.  Amanda Mendez was doing well post-procedure. Her right radial cath site was stable and she was having no further episodes of chest pain. She is considered stable for discharge, to follow up in the office after a Myoview. GI referral will be done as an outpatient.   Labs:   Lab Results  Component Value Date   WBC 8.9 11/03/2012   HGB 14.5 11/03/2012   HCT 42.2 11/03/2012   MCV 93.2 11/03/2012   PLT 366 11/03/2012    Lab 11/03/12 1543  NA 135  K 3.4*  CL 93*  CO2 26  BUN 13  CREATININE  0.68  CALCIUM 10.4  PROT --  BILITOT --  ALKPHOS --  ALT --  AST --  GLUCOSE 205*    Basename 11/03/12 2048 11/03/12 1622  CKTOTAL -- --  CKMB -- --  CKMBINDEX -- --  TROPONINI <0.30 <0.30   Lipid Panel     Component Value Date/Time   CHOL 275* 08/14/2012 0251   TRIG 557* 08/14/2012 0251   HDL 43 08/14/2012 0251   CHOLHDL 6.4 08/14/2012 0251   VLDL UNABLE TO CALCULATE IF TRIGLYCERIDE OVER 400 mg/dL 06/17/2955 2130   LDLCALC UNABLE TO CALCULATE IF TRIGLYCERIDE OVER 400 mg/dL 07/16/5783 6962    Pro B Natriuretic peptide (BNP)  Date/Time Value Range Status  11/03/2012  4:22 PM 45.9  0 - 125 pg/mL Final  08/13/2012  4:32 PM 63.2  0 - 125 pg/mL Final    Basename 11/03/12 1828  INR 0.89      Radiology: Dg Chest 2 View 11/03/2012  *RADIOLOGY REPORT*  Clinical Data: Chest pain.  CHEST - 2 VIEW  Comparison: August 13, 2012.  Findings: Cardiomediastinal silhouette appears normal.  No acute pulmonary disease is noted.  Bony thorax is intact.  IMPRESSION: No acute cardiopulmonary abnormality seen.   Original Report Authenticated By: Lupita Raider.,  M.D.    Cardiac Cath:  11/03/2012 Left mainstem: Normal  Left anterior descending (LAD): Large wrapping the apex. 30% proximal at takeoff of large diagonal. In the  diagonal there is proximal 25%  Left circumflex (LCx): Co dominant. AV large and normal. OM large and branching with proximal stent which is widely patent. There is a small marginal branch that comes off at the distal end of the stent which has a 30-40% lesion in the ostium. PDA large and normal  Right coronary artery (RCA): Small to moderate with a high anterior take off. Mild luminal irregularities. Mid 70-75%. PDA small and normal  Left ventriculography: Left ventricular systolic function is normal, LVEF is estimated at 60-65%, there is no significant mitral regurgitation  Assessment:  1. CAD with patent OM stent and stable 70-75% in RCA  2. Normal LV function  Plan/Discussion:    Films reviewed with Dr. Excell Seltzer. Lesion in RCA is borderline but stable from last cath. Doubt this is a source of resting CP but may be potential source of exertional angina. Resting pain likely to be more GI. Will continue to treat CAD medically. Start protonix 40 daily and refer to GI. If CP persists, suggest outpatient Myoview to further evaluate significance of RCA disease.   EKG: 03-Nov-2012 14:56:47 [facility]/ED ROUTINE RECORD Normal sinus rhythm with sinus arrhythmia Possible Anterior infarct , age undetermined Abnormal ECG 35mm/s 30mm/mV 100Hz  8.0.1 12SL 241 HD CID: 1 Referred by: Unconfirmed Vent. rate 84 BPM PR interval 146 ms QRS duration 84 ms QT/QTc 458/541 ms P-R-T axes 55 18 71  FOLLOW UP PLANS AND APPOINTMENTS Allergies  Allergen Reactions  . Codeine Nausea And Vomiting    But has tolerated morphine     Medication List     As of 11/04/2012  9:34 AM    STOP taking these medications         aspirin 81 MG EC tablet      TAKE these medications         acetaminophen 500 MG tablet   Commonly known as: TYLENOL   Take 500 mg by mouth every 6 (six) hours as needed. For headache.      atorvastatin 40 MG tablet   Commonly known as: LIPITOR   Take 1 tablet (40 mg total) by mouth daily.      clopidogrel 75 MG tablet   Commonly known as: PLAVIX   Take 1 tablet (75 mg total) by mouth daily.      furosemide 20 MG tablet   Commonly known as: LASIX   Take 1 tablet (20 mg total) by mouth as needed.      loperamide 2 MG tablet   Commonly known as: IMODIUM A-D   Take 2 mg by mouth 4 (four) times daily as needed. For diarrhea.      losartan-hydrochlorothiazide 100-25 MG per tablet   Commonly known as: HYZAAR   Take 1 tablet by mouth every evening.      metFORMIN 500 MG tablet   Commonly known as: GLUCOPHAGE   Take 1 tablet (500 mg total) by mouth 2 (two) times daily with a meal. HOLD for 48 hours, restart on 08/17/2012.      metoprolol 50 MG  tablet   Commonly known as: LOPRESSOR   Take 50 mg by mouth 2 (two) times daily.      nitroGLYCERIN 0.4 MG SL tablet   Commonly known as: NITROSTAT   Place 1 tablet (0.4 mg total) under the tongue every 5 (five) minutes as needed for chest pain.      pantoprazole 40 MG tablet   Commonly known as: PROTONIX   Take 1 tablet (  40 mg total) by mouth daily.      TYLENOL SINUS CONGESTION/PAIN 2-5-325 & 5-325 MG Misc   Generic drug: Chlorphen-Phenyleph-APAP   Take 1 tablet by mouth daily as needed. For sinus congestion.            Discharge Orders    Future Appointments: Provider: Department: Dept Phone: Center:   11/05/2012 12:00 PM - the patient is to get an outpatient Myoview plus GI referral then followup   Rollene Rotunda, MD Selena Batten at [place] [phone] [facility]     Future Orders Please Complete By Expires   Diet - low sodium heart healthy      Diet Carb Modified      Increase activity slowly          BRING ALL MEDICATIONS WITH YOU TO FOLLOW UP APPOINTMENTS  Time spent with patient to include physician time: 31 min Signed: Theodore Demark 11/04/2012, 9:34 AM Co-Sign MD

## 2012-11-04 NOTE — Telephone Encounter (Signed)
Message copied by Sharin Grave on Tue Nov 04, 2012 12:25 PM ------      Message from: Sherrilyn Rist      Created: Tue Nov 04, 2012 12:22 PM       Pam, forwarding message over per your request.                   g      ----- Message -----         From: Darrol Jump, PA         Sent: 11/03/2012   8:28 PM           To: Dolores Patty, MD, Leonette Nutting,      Please get Ms Zettel a f/u appt with [facility] (in [place] if possible) and please refer her or get the RN to refer her to GI for atypical chest pain. She will also need an outpatient Lexiscan Myoview to eval her chest pain. I am working 11/26 if I need to do anything else.            Thank you, wonderful lady.      Have a good T-day      Rhodna

## 2012-11-05 ENCOUNTER — Encounter: Payer: Self-pay | Admitting: *Deleted

## 2012-11-05 ENCOUNTER — Ambulatory Visit (INDEPENDENT_AMBULATORY_CARE_PROVIDER_SITE_OTHER): Payer: BC Managed Care – PPO | Admitting: Cardiology

## 2012-11-05 DIAGNOSIS — R0989 Other specified symptoms and signs involving the circulatory and respiratory systems: Secondary | ICD-10-CM

## 2012-11-06 NOTE — Discharge Summary (Signed)
Agree with above. OK for d/c  Daniel Bensimhon,MD 11:58 AM

## 2012-11-11 ENCOUNTER — Telehealth (HOSPITAL_COMMUNITY): Payer: Self-pay

## 2012-11-11 NOTE — Telephone Encounter (Signed)
Patient called this am to cancel post hospital Exercise Cardiol[facility] for 11-12-12 due to husband having surgery tomorrow. The patient will call back to reschedule per Lynford Citizen, CN-MT. Notified Avie Arenas (Dr. Fayrene Fearing Hochrein's nurse) of cancellation. Irean Hong, RN

## 2012-11-12 ENCOUNTER — Encounter (HOSPITAL_COMMUNITY): Payer: BC Managed Care – PPO

## 2013-08-13 ENCOUNTER — Other Ambulatory Visit (HOSPITAL_COMMUNITY): Payer: Self-pay | Admitting: *Deleted

## 2013-08-13 DIAGNOSIS — J329 Chronic sinusitis, unspecified: Secondary | ICD-10-CM

## 2013-08-13 MED ORDER — AMOXICILLIN 500 MG PO TABS: 500.0000 mg | ORAL_TABLET | Freq: Two times a day (BID) | ORAL | Status: DC

## 2014-02-11 ENCOUNTER — Other Ambulatory Visit: Payer: Self-pay

## 2014-02-11 MED ORDER — FUROSEMIDE 20 MG PO TABS: 20.0000 mg | ORAL_TABLET | ORAL | Status: DC | PRN

## 2014-05-27 ENCOUNTER — Encounter (HOSPITAL_COMMUNITY): Payer: Self-pay | Admitting: Emergency Medicine

## 2014-05-27 ENCOUNTER — Emergency Department (HOSPITAL_COMMUNITY): Payer: BC Managed Care – PPO

## 2014-05-27 ENCOUNTER — Inpatient Hospital Stay (HOSPITAL_COMMUNITY)
Admission: EM | Admit: 2014-05-27 | Discharge: 2014-05-29 | DRG: 392 | Disposition: A | Discharge status: Home / Self Care | Payer: BC Managed Care – PPO | Attending: Internal Medicine | Admitting: Internal Medicine

## 2014-05-27 DIAGNOSIS — Z9861 Coronary angioplasty status: Secondary | ICD-10-CM

## 2014-05-27 DIAGNOSIS — IMO0002 Reserved for concepts with insufficient information to code with codable children: Secondary | ICD-10-CM

## 2014-05-27 DIAGNOSIS — I2584 Coronary atherosclerosis due to calcified coronary lesion: Secondary | ICD-10-CM

## 2014-05-27 DIAGNOSIS — Z79899 Other long term (current) drug therapy: Secondary | ICD-10-CM

## 2014-05-27 DIAGNOSIS — Z87891 Personal history of nicotine dependence: Secondary | ICD-10-CM

## 2014-05-27 DIAGNOSIS — E1165 Type 2 diabetes mellitus with hyperglycemia: Secondary | ICD-10-CM

## 2014-05-27 DIAGNOSIS — IMO0001 Reserved for inherently not codable concepts without codable children: Secondary | ICD-10-CM | POA: Diagnosis present

## 2014-05-27 DIAGNOSIS — E1169 Type 2 diabetes mellitus with other specified complication: Secondary | ICD-10-CM | POA: Diagnosis present

## 2014-05-27 DIAGNOSIS — E119 Type 2 diabetes mellitus without complications: Secondary | ICD-10-CM | POA: Diagnosis present

## 2014-05-27 DIAGNOSIS — R109 Unspecified abdominal pain: Secondary | ICD-10-CM | POA: Diagnosis present

## 2014-05-27 DIAGNOSIS — E872 Acidosis, unspecified: Secondary | ICD-10-CM | POA: Diagnosis present

## 2014-05-27 DIAGNOSIS — I5031 Acute diastolic (congestive) heart failure: Secondary | ICD-10-CM

## 2014-05-27 DIAGNOSIS — E1122 Type 2 diabetes mellitus with diabetic chronic kidney disease: Secondary | ICD-10-CM | POA: Diagnosis present

## 2014-05-27 DIAGNOSIS — A09 Infectious gastroenteritis and colitis, unspecified: Principal | ICD-10-CM | POA: Diagnosis present

## 2014-05-27 DIAGNOSIS — E785 Hyperlipidemia, unspecified: Secondary | ICD-10-CM | POA: Diagnosis present

## 2014-05-27 DIAGNOSIS — K219 Gastro-esophageal reflux disease without esophagitis: Secondary | ICD-10-CM | POA: Diagnosis present

## 2014-05-27 DIAGNOSIS — I2510 Atherosclerotic heart disease of native coronary artery without angina pectoris: Secondary | ICD-10-CM | POA: Diagnosis present

## 2014-05-27 DIAGNOSIS — R197 Diarrhea, unspecified: Secondary | ICD-10-CM

## 2014-05-27 DIAGNOSIS — I998 Other disorder of circulatory system: Secondary | ICD-10-CM | POA: Diagnosis present

## 2014-05-27 DIAGNOSIS — N1831 Chronic kidney disease, stage 3a: Secondary | ICD-10-CM | POA: Diagnosis present

## 2014-05-27 DIAGNOSIS — I10 Essential (primary) hypertension: Secondary | ICD-10-CM | POA: Diagnosis present

## 2014-05-27 DIAGNOSIS — E7800 Pure hypercholesterolemia, unspecified: Secondary | ICD-10-CM | POA: Diagnosis present

## 2014-05-27 DIAGNOSIS — I200 Unstable angina: Secondary | ICD-10-CM

## 2014-05-27 DIAGNOSIS — R1032 Left lower quadrant pain: Secondary | ICD-10-CM

## 2014-05-27 DIAGNOSIS — R1110 Vomiting, unspecified: Secondary | ICD-10-CM

## 2014-05-27 DIAGNOSIS — I152 Hypertension secondary to endocrine disorders: Secondary | ICD-10-CM | POA: Diagnosis present

## 2014-05-27 LAB — URINALYSIS, ROUTINE W REFLEX MICROSCOPIC
Hgb urine dipstick: NEGATIVE
Ketones, ur: 15 mg/dL — AB
LEUKOCYTES UA: NEGATIVE
NITRITE: NEGATIVE
PH: 5 (ref 5.0–8.0)
Protein, ur: 100 mg/dL — AB
SPECIFIC GRAVITY, URINE: 1.023 (ref 1.005–1.030)
Urobilinogen, UA: 0.2 mg/dL (ref 0.0–1.0)

## 2014-05-27 LAB — I-STAT CG4 LACTIC ACID, ED: Lactic Acid, Venous: 3.68 mmol/L — ABNORMAL HIGH (ref 0.5–2.2)

## 2014-05-27 LAB — CBC WITH DIFFERENTIAL/PLATELET
BASOS ABS: 0 10*3/uL (ref 0.0–0.1)
BASOS PCT: 0 % (ref 0–1)
EOS ABS: 0.3 10*3/uL (ref 0.0–0.7)
Eosinophils Relative: 2 % (ref 0–5)
HCT: 44.5 % (ref 36.0–46.0)
HEMOGLOBIN: 15.3 g/dL — AB (ref 12.0–15.0)
Lymphocytes Relative: 8 % — ABNORMAL LOW (ref 12–46)
Lymphs Abs: 1 10*3/uL (ref 0.7–4.0)
MCH: 31.9 pg (ref 26.0–34.0)
MCHC: 34.4 g/dL (ref 30.0–36.0)
MCV: 92.9 fL (ref 78.0–100.0)
MONOS PCT: 7 % (ref 3–12)
Monocytes Absolute: 0.9 10*3/uL (ref 0.1–1.0)
NEUTROS ABS: 11 10*3/uL — AB (ref 1.7–7.7)
NEUTROS PCT: 83 % — AB (ref 43–77)
PLATELETS: 338 10*3/uL (ref 150–400)
RBC: 4.79 MIL/uL (ref 3.87–5.11)
RDW: 12.2 % (ref 11.5–15.5)
WBC: 13.2 10*3/uL — ABNORMAL HIGH (ref 4.0–10.5)

## 2014-05-27 LAB — COMPREHENSIVE METABOLIC PANEL
ALBUMIN: 4.1 g/dL (ref 3.5–5.2)
ALK PHOS: 102 U/L (ref 39–117)
ALT: 47 U/L — ABNORMAL HIGH (ref 0–35)
AST: 44 U/L — AB (ref 0–37)
BUN: 25 mg/dL — AB (ref 6–23)
CO2: 23 mEq/L (ref 19–32)
Calcium: 10.3 mg/dL (ref 8.4–10.5)
Chloride: 89 mEq/L — ABNORMAL LOW (ref 96–112)
Creatinine, Ser: 0.97 mg/dL (ref 0.50–1.10)
GFR calc Af Amer: 73 mL/min — ABNORMAL LOW (ref >90)
GFR calc non Af Amer: 63 mL/min — ABNORMAL LOW (ref >90)
Glucose, Bld: 408 mg/dL — ABNORMAL HIGH (ref 70–99)
POTASSIUM: 3.6 meq/L — AB (ref 3.7–5.3)
Sodium: 135 mEq/L — ABNORMAL LOW (ref 137–147)
TOTAL PROTEIN: 7.9 g/dL (ref 6.0–8.3)
Total Bilirubin: 0.4 mg/dL (ref 0.3–1.2)

## 2014-05-27 LAB — CBG MONITORING, ED
Glucose-Capillary: 378 mg/dL — ABNORMAL HIGH (ref 70–99)
Glucose-Capillary: 191 mg/dL — ABNORMAL HIGH (ref 70–99)

## 2014-05-27 LAB — URINE MICROSCOPIC-ADD ON

## 2014-05-27 LAB — LIPASE, BLOOD: LIPASE: 23 U/L (ref 11–59)

## 2014-05-27 IMAGING — CT CT ABD-PELV W/ CM
2 of 5 series · 16 of 46 positions shown, 18 images · IV contrast (omnipaque)
Comparison: None.

CLINICAL DATA: Diarrhea. Left-sided abdominal pain, nausea,
vomiting and hyperglycemia. Lightheaded.

EXAM:
CT ABDOMEN AND PELVIS WITH CONTRAST
TECHNIQUE: Multidetector CT imaging of the abdomen and pelvis was performed
using the standard protocol following bolus administration of
intravenous contrast.
CONTRAST:  100mL OMNIPAQUE IOHEXOL 300 MG/ML  SOLN

[Series 2: abd/ pelvis 5.0 i30f 1 · axial · 0.78mm/px · z∈[-556,-126]mm · 13 of 98 slices shown, 15 images]
[im 6/98  soft-tissue]
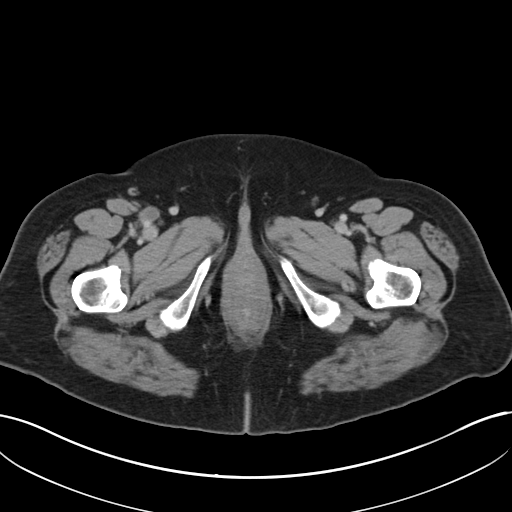
[im 6/98  bone]
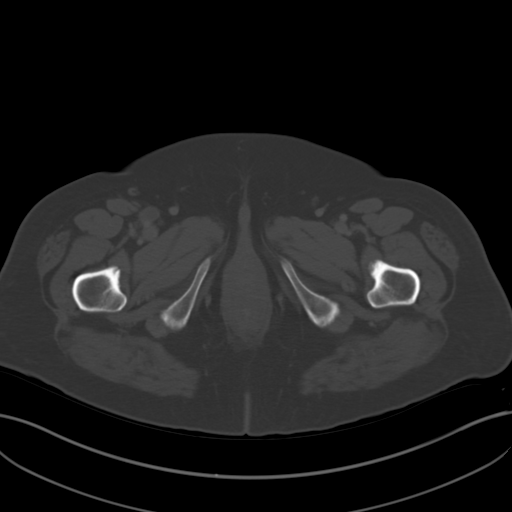
[im 11/98  soft-tissue]
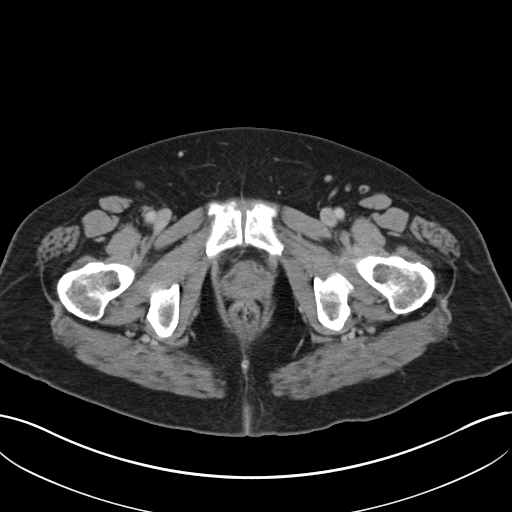
[im 22/98  soft-tissue]
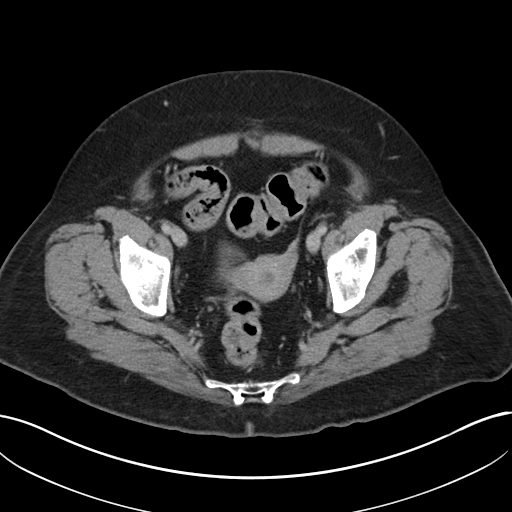
[im 27/98  soft-tissue]
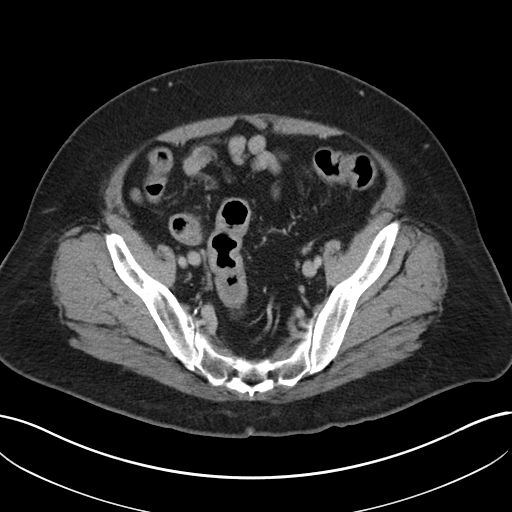
[im 33/98  soft-tissue]
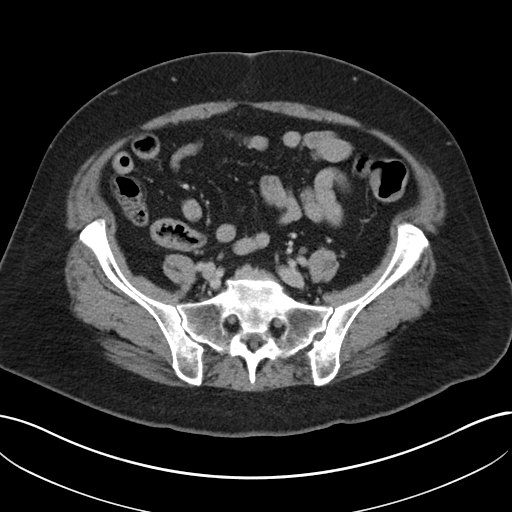
[im 44/98  soft-tissue]
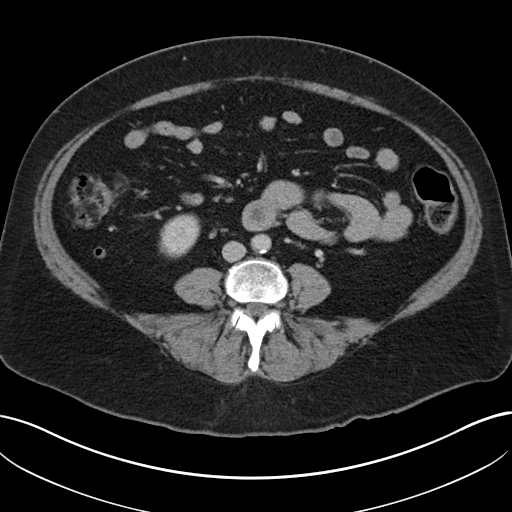
[im 49/98  soft-tissue]
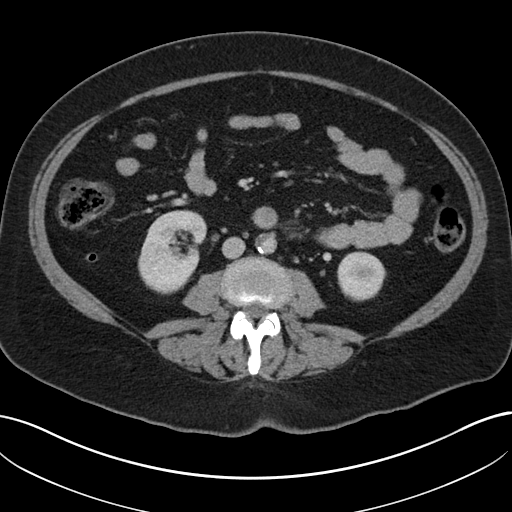
[im 54/98  soft-tissue]
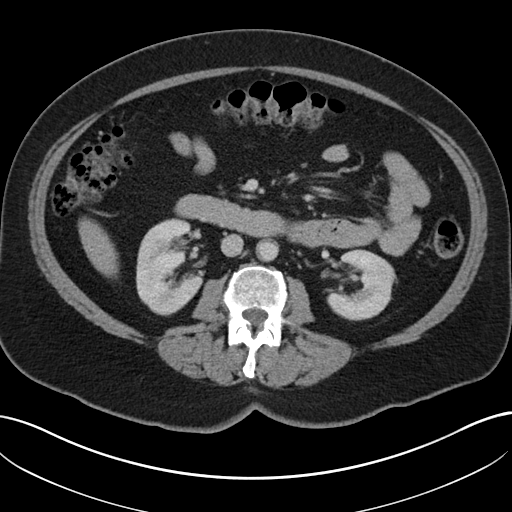
[im 65/98  soft-tissue]
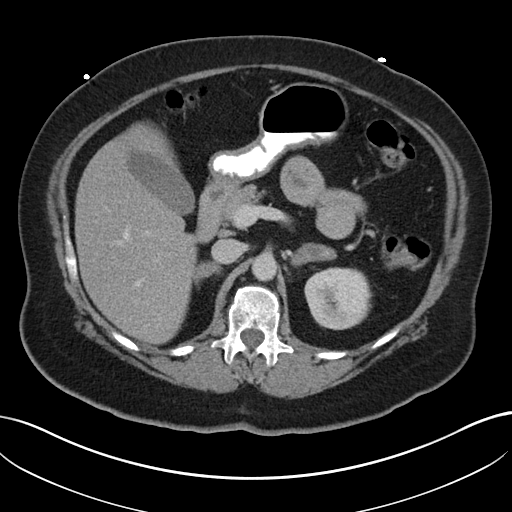
[im 65/98  bone]
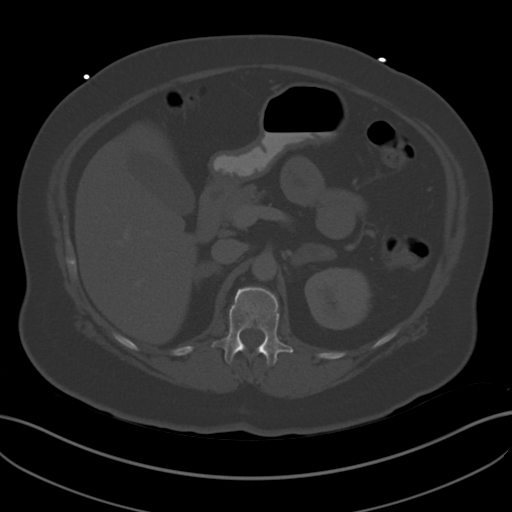
[im 71/98  soft-tissue]
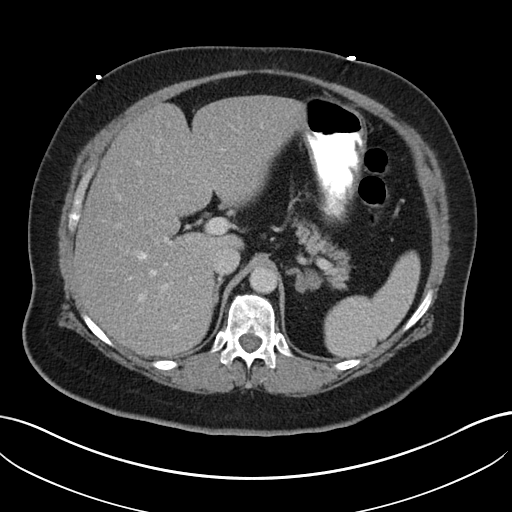
[im 76/98  soft-tissue]
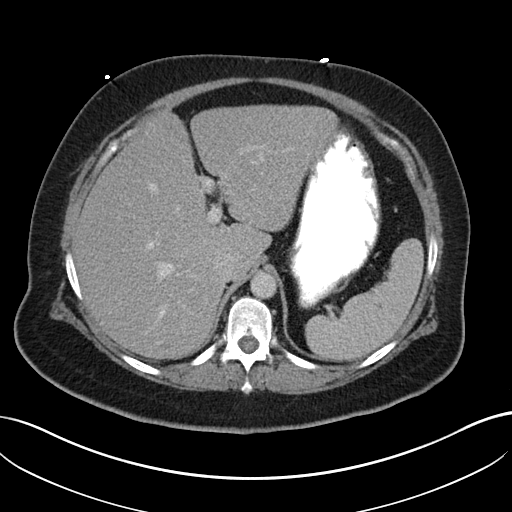
[im 87/98  soft-tissue]
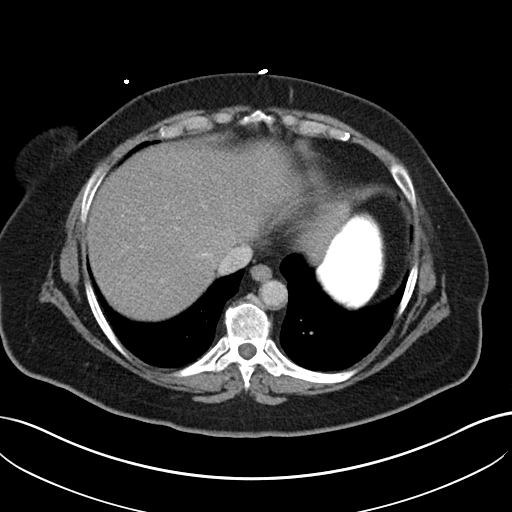
[im 92/98  soft-tissue]
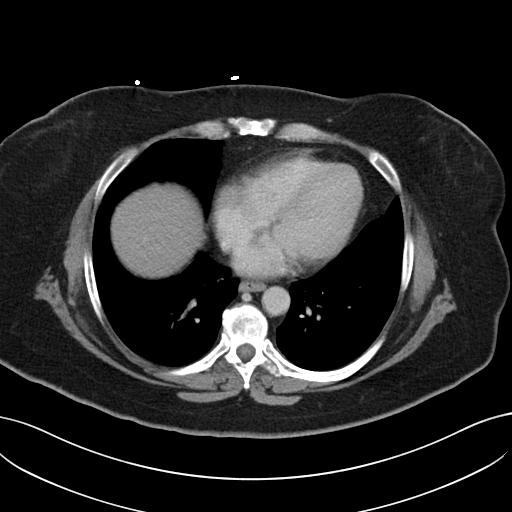

[Series 5: coronal soft tissue · coronal · 0.90mm/px · 3 of 84 slices shown]
[im 28/84  soft-tissue]
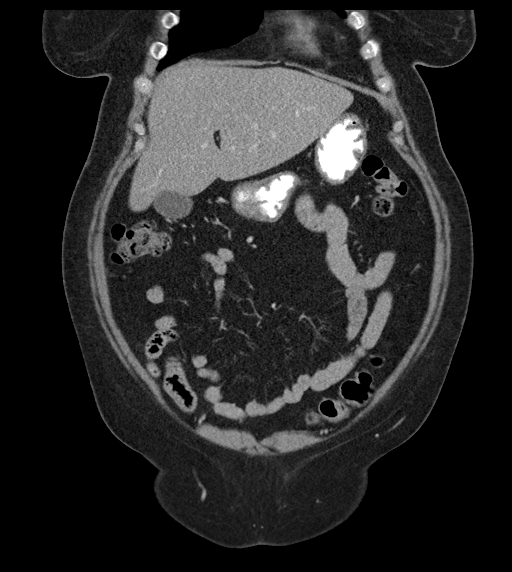
[im 37/84  soft-tissue]
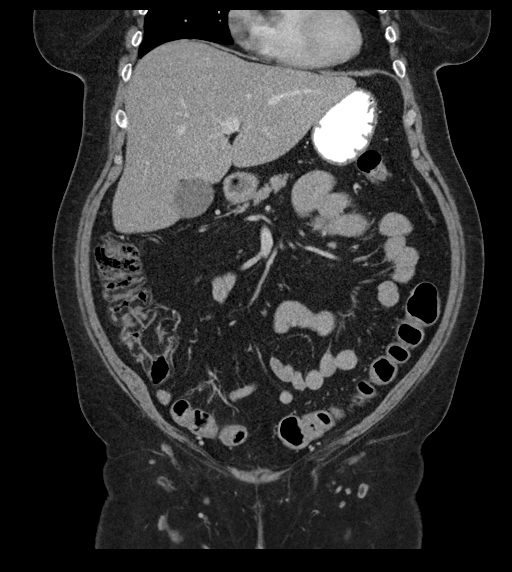
[im 47/84  soft-tissue]
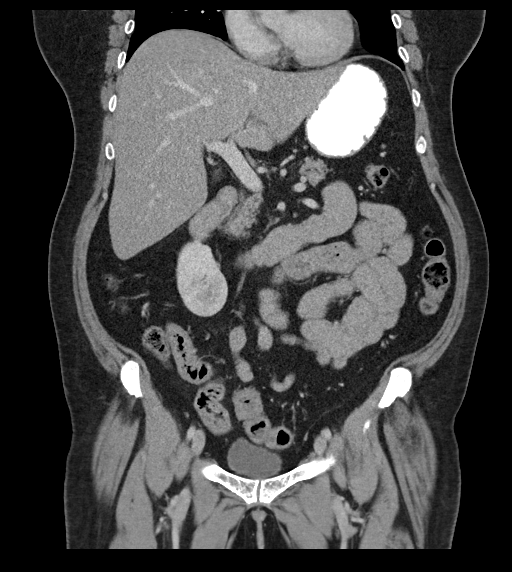

[16 of 46 positions shown; findings below may reference images not displayed]

FINDINGS: The visualized lung bases are clear.

The liver and spleen are unremarkable in appearance. The gallbladder
is within normal limits. There is bilateral prominence of the
adrenal glands, somewhat nodular in appearance. The pancreas is
unremarkable.

The kidneys are unremarkable in appearance. There is no evidence of
hydronephrosis. No renal or ureteral stones are seen. No perinephric
stranding is appreciated.

No free fluid is identified. The small bowel is unremarkable in
appearance. The stomach is within normal limits. No acute vascular
abnormalities are seen. Scattered mild calcification is seen along
the abdominal aorta and its branches, with mild intramural thrombus.

The appendix is normal in caliber and contains air, without evidence
for appendicitis. The colon is largely empty and unremarkable in
appearance. There is fatty infiltration within the cecum and
proximal ascending colon, likely reflecting sequelae of remote
inflammation.

The bladder is mildly distended and grossly unremarkable. The uterus
is within normal limits. The ovaries are relatively symmetric; no
suspicious adnexal masses are seen. No inguinal lymphadenopathy is
seen.

A small left inguinal hernia is noted, containing only fat.

No acute osseous abnormalities are identified.
IMPRESSION: 1. No acute abnormality seen within the abdomen or pelvis.
2. Small left inguinal hernia, containing only fat.
3. Scattered mild calcification along the abdominal aorta and its
branches, with mild associated intramural thrombus but no
significant luminal narrowing.
4. Bilateral adrenal gland prominence raises concern for adrenal
hyperplasia.

## 2014-05-27 MED ORDER — IOHEXOL 300 MG/ML  SOLN
25.0000 mL | Freq: Once | INTRAMUSCULAR | Status: AC | PRN
  Administered 2014-05-27: 25 mL via ORAL

## 2014-05-27 MED ORDER — ONDANSETRON HCL 4 MG/2ML IJ SOLN
4.0000 mg | Freq: Once | INTRAMUSCULAR | Status: AC
  Administered 2014-05-27: 4 mg via INTRAVENOUS
  Filled 2014-05-27: qty 2

## 2014-05-27 MED ORDER — SODIUM CHLORIDE 0.9 % IV BOLUS (SEPSIS)
1000.0000 mL | Freq: Once | INTRAVENOUS | Status: AC
  Administered 2014-05-27: 1000 mL via INTRAVENOUS

## 2014-05-27 MED ORDER — HYDROMORPHONE HCL PF 1 MG/ML IJ SOLN
1.0000 mg | Freq: Once | INTRAMUSCULAR | Status: AC
  Administered 2014-05-27: 1 mg via INTRAVENOUS
  Filled 2014-05-27: qty 1

## 2014-05-27 NOTE — ED Provider Notes (Signed)
CSN: [id number]     Arrival date & time 05/27/14  1746 History   First MD Initiated Contact with Patient 05/27/14 2105     Chief Complaint  Patient presents with  . Abdominal Pain  . Emesis     (Consider location/radiation/quality/duration/timing/severity/associated sxs/prior Treatment) HPI Comments: The patient is a 58 year female with past medical history of diabetes, hypertension, obesity, coronary artery disease, reflux presenting to the emergency department chief complaint of abdominal pain for 4 days. The patient describes the discomfort as left-sided with radiation into the midline. She reports one episode of vomiting 3 days ago and 2 episodes of vomiting today. She reports chronic diarrhea unchanged from baseline, no blood or pus. The patient reports her blood sugars usually run 200-400. She is compliant with her metformin. The patient denies history of abdominal surgeries, no history of colonoscopy.   Patient is a 58 y.o. female presenting with abdominal pain and vomiting. The history is provided by the patient. No language interpreter was used.  Abdominal Pain Associated symptoms: chills, diarrhea, nausea and vomiting   Associated symptoms: no chest pain, no constipation, no fever and no shortness of breath   Emesis Associated symptoms: abdominal pain, chills and diarrhea     Past Medical History  Diagnosis Date  . Hypertension   . Obesity (BMI 30-39.9)   . Hypercholesterolemia with hypertriglyceridemia   . Coronary artery disease     angioplasty 9/13  . Anginal pain   . Pneumonia 2012  . Exertional dyspnea     "usually; today it was with nothing" (11/03/2012)  . Type II diabetes mellitus 2007  . GERD (gastroesophageal reflux disease)   . Sinus headache     "weekly" (11/03/2012)  . Kidney stones     "I've had them 5 times; lithotripsy 1st time; flushed out  all the others" (11/03/2012)   Past Surgical History  Procedure Laterality Date  . Tubal ligation  1980  .  Hematoma evacuation  09/22/2012    Procedure: EVACUATION HEMATOMA;  Surgeon: Wynonia Sours, MD;  Location: [place];  Service: Orthopedics;  Laterality: Right;  Evacuation Hematoma right hand  . Coronary angioplasty with stent placement  08/2012    "1"  . Cardiac catheterization  11/25/29013  . Skin biopsy  2012    "off my back; it was nothing fatal; infection caused it" (11/03/2012)  . Lithotripsy  1990's   Family History  Problem Relation Age of Onset  . Heart failure Mother     Died in her 51s, had angina starting in her 53s  . Crohn's disease Brother    History  Substance Use Topics  . Smoking status: Former Smoker -- 1.50 packs/day for 37 years    Types: Cigarettes    Quit date: 06/11/2012  . Smokeless tobacco: Never Used  . Alcohol Use: Yes     Comment: 11/03/2012 "aien't drank none in ~ 10 yr; then it was just an occasional daquiri on vacation"   OB History   Grav Para Term Preterm Abortions TAB SAB Ect Mult Living                 Review of Systems  Constitutional: Positive for chills. Negative for fever.  Respiratory: Negative for shortness of breath.   Cardiovascular: Negative for chest pain.  Gastrointestinal: Positive for nausea, vomiting, abdominal pain and diarrhea. Negative for constipation and blood in stool.      Allergies  Codeine  Home Medications   Prior to Admission  medications   Medication Sig Start Date End Date Taking? Authorizing Provider  acetaminophen (TYLENOL) 500 MG tablet Take 500 mg by mouth every 6 (six) hours as needed. For headache.   Yes Historical Provider, MD  Chlorphen-Phenyleph-APAP (TYLENOL SINUS CONGESTION/PAIN) 2-5-325 & 5-325 MG MISC Take 1 tablet by mouth daily as needed. For sinus congestion.   Yes Historical Provider, MD  furosemide (LASIX) 20 MG tablet Take 1 tablet (20 mg total) by mouth as needed. 02/11/14  Yes Minus Breeding, MD  loperamide (IMODIUM A-D) 2 MG tablet Take 2 mg by mouth 4 (four) times daily as  needed. For diarrhea.   Yes Historical Provider, MD  losartan-hydrochlorothiazide (HYZAAR) 100-25 MG per tablet Take 1 tablet by mouth every evening.   Yes Historical Provider, MD  metFORMIN (GLUCOPHAGE) 500 MG tablet Take 1 tablet (500 mg total) by mouth 2 (two) times daily with a meal. HOLD for 48 hours, restart on 08/17/2012. 08/15/12  Yes Rhonda G Barrett, PA-C  metoprolol (LOPRESSOR) 50 MG tablet Take 50 mg by mouth 2 (two) times daily.   Yes Historical Provider, MD  nitroGLYCERIN (NITROSTAT) 0.4 MG SL tablet Place 1 tablet (0.4 mg total) under the tongue every 5 (five) minutes as needed for chest pain. 11/03/12  Yes Rhonda G Barrett, PA-C  pantoprazole (PROTONIX) 40 MG tablet Take 1 tablet (40 mg total) by mouth daily. 11/03/12  Yes Rhonda G Barrett, PA-C  amoxicillin (AMOXIL) 500 MG tablet Take 500 mg by mouth 2 (two) times daily. Patient not sure when she completed course of medication 08/13/13   Jolaine Artist, MD  atorvastatin (LIPITOR) 40 MG tablet Take 1 tablet (40 mg total) by mouth daily. 08/15/12 08/15/13  Rhonda G Barrett, PA-C   BP 140/75  Pulse 94  Temp(Src) 98.1 F (36.7 C) (Oral)  Resp 21  Ht 5\' 2"  (1.575 m)  Wt 184 lb (83.462 kg)  BMI 33.65 kg/m2  SpO2 96% Physical Exam  Nursing note and vitals reviewed. Constitutional: She is oriented to person, place, and time. She appears well-developed and well-nourished.  Non-toxic appearance. She does not have a sickly appearance. She does not appear ill. No distress.  HENT:  Head: Normocephalic and atraumatic.  Eyes: EOM are normal. Pupils are equal, round, and reactive to light. Right eye exhibits no discharge. Left eye exhibits no discharge. No scleral icterus.  Neck: Normal range of motion. Neck supple.  Cardiovascular: Normal rate and regular rhythm.   Murmur heard. No lower extremity edema. Not tachycardic on exam.  Pulmonary/Chest: Effort normal and breath sounds normal. No respiratory distress. She has no wheezes. She has no  rales. She exhibits no tenderness.  Abdominal: Soft. Bowel sounds are normal. She exhibits no distension. There is tenderness. There is no rigidity, no rebound, no guarding and no CVA tenderness.    Exam limited by body habitus.  Musculoskeletal: Normal range of motion.  Neurological: She is alert and oriented to person, place, and time.  Skin: Skin is warm and dry. No rash noted. She is not diaphoretic.  Psychiatric: She has a normal mood and affect. Her behavior is normal. Thought content normal.    ED Course  Procedures (including critical care time) Labs Review Results for orders placed during the hospital encounter of 05/27/14  CBC WITH DIFFERENTIAL      Result Value Ref Range   WBC 13.2 (*) 4.0 - 10.5 K/uL   RBC 4.79  3.87 - 5.11 MIL/uL   Hemoglobin 15.3 (*) 12.0 - 15.0 g/dL  HCT 44.5  36.0 - 46.0 %   MCV 92.9  78.0 - 100.0 fL   MCH 31.9  26.0 - 34.0 pg   MCHC 34.4  30.0 - 36.0 g/dL   RDW 12.2  11.5 - 15.5 %   Platelets 338  150 - 400 K/uL   Neutrophils Relative % 83 (*) 43 - 77 %   Neutro Abs 11.0 (*) 1.7 - 7.7 K/uL   Lymphocytes Relative 8 (*) 12 - 46 %   Lymphs Abs 1.0  0.7 - 4.0 K/uL   Monocytes Relative 7  3 - 12 %   Monocytes Absolute 0.9  0.1 - 1.0 K/uL   Eosinophils Relative 2  0 - 5 %   Eosinophils Absolute 0.3  0.0 - 0.7 K/uL   Basophils Relative 0  0 - 1 %   Basophils Absolute 0.0  0.0 - 0.1 K/uL  COMPREHENSIVE METABOLIC PANEL      Result Value Ref Range   Sodium 135 (*) 137 - 147 mEq/L   Potassium 3.6 (*) 3.7 - 5.3 mEq/L   Chloride 89 (*) 96 - 112 mEq/L   CO2 23  19 - 32 mEq/L   Glucose, Bld 408 (*) 70 - 99 mg/dL   BUN 25 (*) 6 - 23 mg/dL   Creatinine, Ser 0.97  0.50 - 1.10 mg/dL   Calcium 10.3  8.4 - 10.5 mg/dL   Total Protein 7.9  6.0 - 8.3 g/dL   Albumin 4.1  3.5 - 5.2 g/dL   AST 44 (*) 0 - 37 U/L   ALT 47 (*) 0 - 35 U/L   Alkaline Phosphatase 102  39 - 117 U/L   Total Bilirubin 0.4  0.3 - 1.2 mg/dL   GFR calc non Af Amer 63 (*) >90 mL/min    GFR calc Af Amer 73 (*) >90 mL/min  LIPASE, BLOOD      Result Value Ref Range   Lipase 23  11 - 59 U/L  URINALYSIS, ROUTINE W REFLEX MICROSCOPIC      Result Value Ref Range   Color, Urine YELLOW  YELLOW   APPearance CLEAR  CLEAR   Specific Gravity, Urine 1.023  1.005 - 1.030   pH 5.0  5.0 - 8.0   Glucose, UA >1000 (*) NEGATIVE mg/dL   Hgb urine dipstick NEGATIVE  NEGATIVE   Bilirubin Urine SMALL (*) NEGATIVE   Ketones, ur 15 (*) NEGATIVE mg/dL   Protein, ur 100 (*) NEGATIVE mg/dL   Urobilinogen, UA 0.2  0.0 - 1.0 mg/dL   Nitrite NEGATIVE  NEGATIVE   Leukocytes, UA NEGATIVE  NEGATIVE  URINE MICROSCOPIC-ADD ON      Result Value Ref Range   Squamous Epithelial / LPF FEW (*) RARE   WBC, UA 0-2  <3 WBC/hpf   Bacteria, UA RARE  RARE   Casts HYALINE CASTS (*) NEGATIVE  BASIC METABOLIC PANEL      Result Value Ref Range   Sodium 139  137 - 147 mEq/L   Potassium 3.1 (*) 3.7 - 5.3 mEq/L   Chloride 95 (*) 96 - 112 mEq/L   CO2 25  19 - 32 mEq/L   Glucose, Bld 219 (*) 70 - 99 mg/dL   BUN 23  6 - 23 mg/dL   Creatinine, Ser 0.86  0.50 - 1.10 mg/dL   Calcium 9.5  8.4 - 10.5 mg/dL   GFR calc non Af Amer 73 (*) >90 mL/min   GFR calc Af Amer 85 (*) >90 mL/min  CBG MONITORING, ED      Result Value Ref Range   Glucose-Capillary 378 (*) 70 - 99 mg/dL   Comment 1 Documented in Chart     Comment 2 Notify RN    I-STAT CG4 LACTIC ACID, ED      Result Value Ref Range   Lactic Acid, Venous 3.68 (*) 0.5 - 2.2 mmol/L  CBG MONITORING, ED      Result Value Ref Range   Glucose-Capillary 191 (*) 70 - 99 mg/dL  I-STAT CG4 LACTIC ACID, ED      Result Value Ref Range   Lactic Acid, Venous 2.18  0.5 - 2.2 mmol/L   Imaging Review Ct Abdomen Pelvis W Contrast  05/28/2014   CLINICAL DATA:  Diarrhea. Left-sided abdominal pain, nausea, vomiting and hyperglycemia. Lightheaded.  EXAM: CT ABDOMEN AND PELVIS WITH CONTRAST  TECHNIQUE: Multidetector CT imaging of the abdomen and pelvis was performed using the  standard protocol following bolus administration of intravenous contrast.  CONTRAST:  154mL OMNIPAQUE IOHEXOL 300 MG/ML  SOLN  COMPARISON:  None.  FINDINGS: The visualized lung bases are clear.  The liver and spleen are unremarkable in appearance. The gallbladder is within normal limits. There is bilateral prominence of the adrenal glands, somewhat nodular in appearance. The pancreas is unremarkable.  The kidneys are unremarkable in appearance. There is no evidence of hydronephrosis. No renal or ureteral stones are seen. No perinephric stranding is appreciated.  No free fluid is identified. The small bowel is unremarkable in appearance. The stomach is within normal limits. No acute vascular abnormalities are seen. Scattered mild calcification is seen along the abdominal aorta and its branches, with mild intramural thrombus.  The appendix is normal in caliber and contains air, without evidence for appendicitis. The colon is largely empty and unremarkable in appearance. There is fatty infiltration within the cecum and proximal ascending colon, likely reflecting sequelae of remote inflammation.  The bladder is mildly distended and grossly unremarkable. The uterus is within normal limits. The ovaries are relatively symmetric; no suspicious adnexal masses are seen. No inguinal lymphadenopathy is seen.  A small left inguinal hernia is noted, containing only fat.  No acute osseous abnormalities are identified.  IMPRESSION: 1. No acute abnormality seen within the abdomen or pelvis. 2. Small left inguinal hernia, containing only fat. 3. Scattered mild calcification along the abdominal aorta and its branches, with mild associated intramural thrombus but no significant luminal narrowing. 4. Bilateral adrenal gland prominence raises concern for adrenal hyperplasia.   Electronically Signed   By: Garald Balding M.D.   On: 05/28/2014 00:30     EKG Interpretation   Date/Time:  Friday May 28 2014 01:47:25 EDT Ventricular  Rate:  94 PR Interval:  168 QRS Duration: 79 QT Interval:  438 QTC Calculation: 548 R Axis:   22 Text Interpretation:  Sinus rhythm Low voltage, precordial leads  Nonspecific T abnormalities, diffuse leads Prolonged QT interval No  significant change since last tracing Confirmed by YAO  MD, DAVID ([id number])  on 05/28/2014 1:51:27 AM      MDM   Final diagnoses:  Lactic acidosis  Left lower quadrant pain  Vomiting and diarrhea   Patient is a poorly controlled diabetic presenting with persistent left-sided abdominal pain for 4 days with associated loose stool and vomiting. Labs, pain medication, nausea medication ordered CT abdomen and pelvis to rule out diverticulitis. CBG on arrival 378, will assess for DKA, intra abdominal processes, UTI.  Anion gap 23, second bolus given. Lact elevated  at 3.68 Re-peat Anion gap 19 Re-evaluation pt reports persistent pain and emesis in the ED. Will consult hospitalist for admission. Dr. Hal Hope to admit the patient, advises BMP, acetone, fluids 100 cc/hour, repeat lactate, troponin, and lactate.  Meds given in ED:  Medications  sodium chloride 0.9 % bolus 1,000 mL (0 mLs Intravenous Stopped 05/27/14 2200)  ondansetron (ZOFRAN) injection 4 mg (4 mg Intravenous Given 05/27/14 2232)  HYDROmorphone (DILAUDID) injection 1 mg (1 mg Intravenous Given 05/27/14 2233)  iohexol (OMNIPAQUE) 300 MG/ML solution 25 mL (25 mLs Oral Contrast Given 05/27/14 2303)  iohexol (OMNIPAQUE) 300 MG/ML solution 100 mL (100 mLs Intravenous Contrast Given 05/28/14 0006)  promethazine (PHENERGAN) injection 25 mg (25 mg Intravenous Given 05/28/14 0050)  0.9 %  sodium chloride infusion ( Intravenous New Bag/Given 05/28/14 0131)    New Prescriptions   No medications on file        Lorrine Kin, PA-C 05/28/14 0248

## 2014-05-27 NOTE — ED Notes (Signed)
CBG 250

## 2014-05-27 NOTE — ED Notes (Addendum)
Pt reports having diarrhea for extended amount of time. Having left side abd pain, n/v and hyperglycemia since Monday. No acute distress noted at triage. Reports feeling lightheaded, no appetite and pale skin. cbg 378 at triage.

## 2014-05-28 ENCOUNTER — Encounter (HOSPITAL_COMMUNITY): Payer: Self-pay | Admitting: Internal Medicine

## 2014-05-28 DIAGNOSIS — E872 Acidosis, unspecified: Secondary | ICD-10-CM

## 2014-05-28 DIAGNOSIS — R109 Unspecified abdominal pain: Secondary | ICD-10-CM | POA: Diagnosis present

## 2014-05-28 DIAGNOSIS — R1110 Vomiting, unspecified: Secondary | ICD-10-CM

## 2014-05-28 DIAGNOSIS — E785 Hyperlipidemia, unspecified: Secondary | ICD-10-CM | POA: Diagnosis present

## 2014-05-28 DIAGNOSIS — R197 Diarrhea, unspecified: Secondary | ICD-10-CM

## 2014-05-28 DIAGNOSIS — R1032 Left lower quadrant pain: Secondary | ICD-10-CM

## 2014-05-28 LAB — CBC WITH DIFFERENTIAL/PLATELET
Basophils Absolute: 0 10*3/uL (ref 0.0–0.1)
Basophils Relative: 0 % (ref 0–1)
Eosinophils Absolute: 0.3 10*3/uL (ref 0.0–0.7)
Eosinophils Relative: 4 % (ref 0–5)
HCT: 37.9 % (ref 36.0–46.0)
Hemoglobin: 12.7 g/dL (ref 12.0–15.0)
Lymphocytes Relative: 30 % (ref 12–46)
Lymphs Abs: 2.1 10*3/uL (ref 0.7–4.0)
MCH: 30.9 pg (ref 26.0–34.0)
MCHC: 33.5 g/dL (ref 30.0–36.0)
MCV: 92.2 fL (ref 78.0–100.0)
Monocytes Absolute: 0.5 10*3/uL (ref 0.1–1.0)
Monocytes Relative: 7 % (ref 3–12)
NEUTROS ABS: 4.2 10*3/uL (ref 1.7–7.7)
Neutrophils Relative %: 59 % (ref 43–77)
PLATELETS: 301 10*3/uL (ref 150–400)
RBC: 4.11 MIL/uL (ref 3.87–5.11)
RDW: 12.3 % (ref 11.5–15.5)
WBC: 7.2 10*3/uL (ref 4.0–10.5)

## 2014-05-28 LAB — I-STAT TROPONIN, ED: Troponin i, poc: 0 ng/mL (ref 0.00–0.08)

## 2014-05-28 LAB — BASIC METABOLIC PANEL
BUN: 23 mg/dL (ref 6–23)
BUN: 21 mg/dL (ref 6–23)
CALCIUM: 9.2 mg/dL (ref 8.4–10.5)
CO2: 25 meq/L (ref 19–32)
CO2: 24 mEq/L (ref 19–32)
CREATININE: 0.75 mg/dL (ref 0.50–1.10)
Calcium: 9.5 mg/dL (ref 8.4–10.5)
Chloride: 95 mEq/L — ABNORMAL LOW (ref 96–112)
Chloride: 94 mEq/L — ABNORMAL LOW (ref 96–112)
Creatinine, Ser: 0.86 mg/dL (ref 0.50–1.10)
GFR calc Af Amer: 85 mL/min — ABNORMAL LOW (ref >90)
GFR calc Af Amer: >90 mL/min (ref >90)
GFR calc non Af Amer: 73 mL/min — ABNORMAL LOW (ref >90)
GLUCOSE: 219 mg/dL — AB (ref 70–99)
Glucose, Bld: 244 mg/dL — ABNORMAL HIGH (ref 70–99)
POTASSIUM: 3.1 meq/L — AB (ref 3.7–5.3)
Potassium: 3.3 mEq/L — ABNORMAL LOW (ref 3.7–5.3)
SODIUM: 139 meq/L (ref 137–147)
Sodium: 136 mEq/L — ABNORMAL LOW (ref 137–147)

## 2014-05-28 LAB — COMPREHENSIVE METABOLIC PANEL
ALK PHOS: 91 U/L (ref 39–117)
ALT: 37 U/L — ABNORMAL HIGH (ref 0–35)
AST: 28 U/L (ref 0–37)
Albumin: 3.6 g/dL (ref 3.5–5.2)
BUN: 17 mg/dL (ref 6–23)
CO2: 25 mEq/L (ref 19–32)
Calcium: 9 mg/dL (ref 8.4–10.5)
Chloride: 97 mEq/L (ref 96–112)
Creatinine, Ser: 0.76 mg/dL (ref 0.50–1.10)
GFR calc non Af Amer: >90 mL/min (ref >90)
GLUCOSE: 185 mg/dL — AB (ref 70–99)
Potassium: 3.3 mEq/L — ABNORMAL LOW (ref 3.7–5.3)
Sodium: 141 mEq/L (ref 137–147)
Total Bilirubin: 0.4 mg/dL (ref 0.3–1.2)
Total Protein: 7.1 g/dL (ref 6.0–8.3)

## 2014-05-28 LAB — GLUCOSE, CAPILLARY
GLUCOSE-CAPILLARY: 206 mg/dL — AB (ref 70–99)
GLUCOSE-CAPILLARY: 220 mg/dL — AB (ref 70–99)
Glucose-Capillary: 245 mg/dL — ABNORMAL HIGH (ref 70–99)
Glucose-Capillary: 156 mg/dL — ABNORMAL HIGH (ref 70–99)
Glucose-Capillary: 165 mg/dL — ABNORMAL HIGH (ref 70–99)
Glucose-Capillary: 262 mg/dL — ABNORMAL HIGH (ref 70–99)

## 2014-05-28 LAB — KETONES, QUALITATIVE: Acetone, Bld: NEGATIVE

## 2014-05-28 LAB — LACTIC ACID, PLASMA: LACTIC ACID, VENOUS: 4.2 mmol/L — AB (ref 0.5–2.2)

## 2014-05-28 LAB — I-STAT CG4 LACTIC ACID, ED: LACTIC ACID, VENOUS: 2.18 mmol/L (ref 0.5–2.2)

## 2014-05-28 LAB — HEMOGLOBIN A1C
Hgb A1c MFr Bld: 10.5 % — ABNORMAL HIGH (ref <5.7)
Mean Plasma Glucose: 255 mg/dL — ABNORMAL HIGH (ref <117)

## 2014-05-28 LAB — CLOSTRIDIUM DIFFICILE BY PCR: CDIFFPCR: NEGATIVE

## 2014-05-28 MED ORDER — MORPHINE SULFATE 2 MG/ML IJ SOLN
1.0000 mg | INTRAMUSCULAR | Status: DC | PRN
  Administered 2014-05-28 (×2): 1 mg via INTRAVENOUS
  Filled 2014-05-28 (×2): qty 1

## 2014-05-28 MED ORDER — ACETAMINOPHEN 325 MG PO TABS: 650.0000 mg | ORAL_TABLET | Freq: Four times a day (QID) | ORAL | Status: DC | PRN

## 2014-05-28 MED ORDER — ASPIRIN EC 81 MG PO TBEC
81.0000 mg | DELAYED_RELEASE_TABLET | Freq: Every day | ORAL | Status: DC
  Administered 2014-05-28 – 2014-05-29 (×2): 81 mg via ORAL
  Filled 2014-05-28 (×2): qty 1

## 2014-05-28 MED ORDER — CIPROFLOXACIN IN D5W 400 MG/200ML IV SOLN
400.0000 mg | Freq: Two times a day (BID) | INTRAVENOUS | Status: DC
  Administered 2014-05-28 – 2014-05-29 (×3): 400 mg via INTRAVENOUS
  Filled 2014-05-28 (×4): qty 200

## 2014-05-28 MED ORDER — SODIUM CHLORIDE 0.9 % IV SOLN
Freq: Once | INTRAVENOUS | Status: AC
  Administered 2014-05-28: 02:00:00 via INTRAVENOUS

## 2014-05-28 MED ORDER — CLOPIDOGREL BISULFATE 75 MG PO TABS
75.0000 mg | ORAL_TABLET | Freq: Every day | ORAL | Status: DC
  Administered 2014-05-28 – 2014-05-29 (×2): 75 mg via ORAL
  Filled 2014-05-28 (×4): qty 1

## 2014-05-28 MED ORDER — PANTOPRAZOLE SODIUM 40 MG PO TBEC
40.0000 mg | DELAYED_RELEASE_TABLET | Freq: Every day | ORAL | Status: DC
  Administered 2014-05-28 – 2014-05-29 (×2): 40 mg via ORAL
  Filled 2014-05-28 (×2): qty 1

## 2014-05-28 MED ORDER — IOHEXOL 300 MG/ML  SOLN
100.0000 mL | Freq: Once | INTRAMUSCULAR | Status: AC | PRN
  Administered 2014-05-28: 100 mL via INTRAVENOUS

## 2014-05-28 MED ORDER — ACETAMINOPHEN 650 MG RE SUPP: 650.0000 mg | Freq: Four times a day (QID) | RECTAL | Status: DC | PRN

## 2014-05-28 MED ORDER — ONDANSETRON HCL 4 MG PO TABS: 4.0000 mg | ORAL_TABLET | Freq: Four times a day (QID) | ORAL | Status: DC | PRN

## 2014-05-28 MED ORDER — POTASSIUM CHLORIDE IN NACL 20-0.9 MEQ/L-% IV SOLN
INTRAVENOUS | Status: DC
  Administered 2014-05-28: 16:00:00 via INTRAVENOUS
  Filled 2014-05-28 (×3): qty 1000

## 2014-05-28 MED ORDER — ONDANSETRON HCL 4 MG/2ML IJ SOLN: 4.0000 mg | Freq: Four times a day (QID) | INTRAMUSCULAR | Status: DC | PRN

## 2014-05-28 MED ORDER — METRONIDAZOLE IN NACL 5-0.79 MG/ML-% IV SOLN
500.0000 mg | Freq: Three times a day (TID) | INTRAVENOUS | Status: DC
  Administered 2014-05-28 – 2014-05-29 (×4): 500 mg via INTRAVENOUS
  Filled 2014-05-28 (×6): qty 100

## 2014-05-28 MED ORDER — POTASSIUM CHLORIDE IN NACL 20-0.9 MEQ/L-% IV SOLN
INTRAVENOUS | Status: DC
  Administered 2014-05-28: 04:00:00 via INTRAVENOUS
  Filled 2014-05-28 (×3): qty 1000

## 2014-05-28 MED ORDER — HEPARIN SODIUM (PORCINE) 5000 UNIT/ML IJ SOLN
5000.0000 [IU] | Freq: Three times a day (TID) | INTRAMUSCULAR | Status: DC
Start: 2014-05-28 — End: 2014-05-29
  Administered 2014-05-28 – 2014-05-29 (×4): 5000 [IU] via SUBCUTANEOUS
  Filled 2014-05-28 (×7): qty 1

## 2014-05-28 MED ORDER — INSULIN ASPART 100 UNIT/ML ~~LOC~~ SOLN
0.0000 [IU] | Freq: Three times a day (TID) | SUBCUTANEOUS | Status: DC
  Administered 2014-05-28: 3 [IU] via SUBCUTANEOUS
  Administered 2014-05-28 (×2): 2 [IU] via SUBCUTANEOUS
  Administered 2014-05-29: 3 [IU] via SUBCUTANEOUS

## 2014-05-28 MED ORDER — PROMETHAZINE HCL 25 MG/ML IJ SOLN
25.0000 mg | Freq: Once | INTRAMUSCULAR | Status: AC
  Administered 2014-05-28: 25 mg via INTRAVENOUS
  Filled 2014-05-28: qty 1

## 2014-05-28 MED ORDER — ATORVASTATIN CALCIUM 40 MG PO TABS
40.0000 mg | ORAL_TABLET | Freq: Every day | ORAL | Status: DC
  Administered 2014-05-28 – 2014-05-29 (×2): 40 mg via ORAL
  Filled 2014-05-28 (×2): qty 1

## 2014-05-28 MED ORDER — MORPHINE SULFATE 2 MG/ML IJ SOLN: 1.0000 mg | INTRAMUSCULAR | Status: DC | PRN

## 2014-05-28 MED ORDER — NITROGLYCERIN 0.4 MG SL SUBL: 0.4000 mg | SUBLINGUAL_TABLET | SUBLINGUAL | Status: DC | PRN

## 2014-05-28 MED ORDER — METOPROLOL TARTRATE 50 MG PO TABS
50.0000 mg | ORAL_TABLET | Freq: Two times a day (BID) | ORAL | Status: DC
  Administered 2014-05-28 – 2014-05-29 (×4): 50 mg via ORAL
  Filled 2014-05-28 (×5): qty 1

## 2014-05-28 NOTE — ED Notes (Signed)
Admitting MD at bedside.

## 2014-05-28 NOTE — Progress Notes (Signed)
UR Completed Sharetha Newson Graves-Bigelow, RN,BSN [phone]  

## 2014-05-28 NOTE — ED Notes (Signed)
Pt actively vomiting upon walking into room. PA informed, see new orders.

## 2014-05-28 NOTE — Progress Notes (Signed)
Pt seen and examined, admitted this am per Dr.Kakrakandy 57/F admitted with N/V/D Continue supportive care, empiric abx CT unremarkable IVF, advance diet as tolerated -FU Cdiff PCR  -repeat lactic acid in am -further management as condition evolves  Domenic Polite, [place]

## 2014-05-28 NOTE — H&P (Signed)
[facility] History and Physical  [name] CVE:[id number] DOB: [birth year] DOA: 05/27/2014  Referring physician: ER physician. PCP: Sherrie Mustache, MD   Chief Complaint: Abdominal pain nausea vomiting and diarrhea.  HPI: Amanda Mendez is a 58 y.o. female history of CAD status post stenting, diabetes mellitus, hypertension, hyperlipidemia started experiencing nausea vomiting abdominal pain and diarrhea 3 days ago which has been persistently worsening. Pain was initially the left lower current which has started moving across the abdomen. Pain is sometimes colicky but patient has persistent underlying pain. Patient denies any blood in the vomitus or diarrhea. Denies any fever chills. In the ER labs reveal lactic acidosis with high anion gap and elevated blood sugar. Patient's blood sugar improved with IV fluids normal saline 2 L bolus. Denies any chest pain shortness of breath. CT abdomen and pelvis does not show anything acute. Patient still has abdominal pain which improves with pain medication. Patient did use antibiotics last month for tooth ache.   Review of Systems: As presented in the history of presenting illness, rest negative.  Past Medical History  Diagnosis Date  . Hypertension   . Obesity (BMI 30-39.9)   . Hypercholesterolemia with hypertriglyceridemia   . Coronary artery disease     angioplasty 9/13  . Anginal pain   . Pneumonia 2012  . Exertional dyspnea     "usually; today it was with nothing" (11/03/2012)  . Type II diabetes mellitus 2007  . GERD (gastroesophageal reflux disease)   . Sinus headache     "weekly" (11/03/2012)  . Kidney stones     "I've had them 5 times; lithotripsy 1st time; flushed out  all the others" (11/03/2012)   Past Surgical History  Procedure Laterality Date  . Tubal ligation  1980  . Hematoma evacuation  09/22/2012    Procedure: EVACUATION HEMATOMA;  Surgeon: Wynonia Sours, MD;  Location: [place];  Service:  Orthopedics;  Laterality: Right;  Evacuation Hematoma right hand  . Coronary angioplasty with stent placement  08/2012    "1"  . Cardiac catheterization  11/25/29013  . Skin biopsy  2012    "off my back; it was nothing fatal; infection caused it" (11/03/2012)  . Lithotripsy  1990's   Social History:  reports that she quit smoking about 1 years ago. Her smoking use included Cigarettes. She has a 55.5 pack-year smoking history. She has never used smokeless tobacco. She reports that she drinks alcohol. She reports that she does not use illicit drugs. Where does patient live home. Can patient participate in ADLs? Yes.  Allergies  Allergen Reactions  . Codeine Nausea And Vomiting    But has tolerated morphine    Family History:  Family History  Problem Relation Age of Onset  . Heart failure Mother     Died in her 46s, had angina starting in her 4s  . Crohn's disease Brother       Prior to Admission medications   Medication Sig Start Date End Date Taking? Authorizing Provider  acetaminophen (TYLENOL) 500 MG tablet Take 500 mg by mouth every 6 (six) hours as needed. For headache.   Yes Historical Provider, MD  Chlorphen-Phenyleph-APAP (TYLENOL SINUS CONGESTION/PAIN) 2-5-325 & 5-325 MG MISC Take 1 tablet by mouth daily as needed. For sinus congestion.   Yes Historical Provider, MD  furosemide (LASIX) 20 MG tablet Take 1 tablet (20 mg total) by mouth as needed. 02/11/14  Yes Minus Breeding, MD  loperamide (IMODIUM A-D) 2 MG tablet Take 2 mg  by mouth 4 (four) times daily as needed. For diarrhea.   Yes Historical Provider, MD  losartan-hydrochlorothiazide (HYZAAR) 100-25 MG per tablet Take 1 tablet by mouth every evening.   Yes Historical Provider, MD  metFORMIN (GLUCOPHAGE) 500 MG tablet Take 1 tablet (500 mg total) by mouth 2 (two) times daily with a meal. HOLD for 48 hours, restart on 08/17/2012. 08/15/12  Yes Rhonda G Barrett, PA-C  metoprolol (LOPRESSOR) 50 MG tablet Take 50 mg by mouth 2 (two)  times daily.   Yes Historical Provider, MD  nitroGLYCERIN (NITROSTAT) 0.4 MG SL tablet Place 1 tablet (0.4 mg total) under the tongue every 5 (five) minutes as needed for chest pain. 11/03/12  Yes Rhonda G Barrett, PA-C  pantoprazole (PROTONIX) 40 MG tablet Take 1 tablet (40 mg total) by mouth daily. 11/03/12  Yes Rhonda G Barrett, PA-C  amoxicillin (AMOXIL) 500 MG tablet Take 500 mg by mouth 2 (two) times daily. Patient not sure when she completed course of medication 08/13/13   Jolaine Artist, MD  atorvastatin (LIPITOR) 40 MG tablet Take 1 tablet (40 mg total) by mouth daily. 08/15/12 08/15/13  Lonn Georgia, PA-C    Physical Exam: Filed Vitals:   05/27/14 2330 05/28/14 0030 05/28/14 0100 05/28/14 0145  BP: 137/69 135/65 142/61 144/55  Pulse: 85 92 94 91  Temp:      TempSrc:      Resp: _0 Height:      Weight:      SpO2: 94% 98% 93% 98%     General:  Well-developed well-nourished.  Eyes: Anicteric no pallor.  ENT: No discharge from ears eyes nose mouth.  Neck: No mass felt.  Cardiovascular: S1-S2 heard.  Respiratory: No rhonchi or crepitations.  Abdomen: Soft mild tenderness in the peri-umbilical area no guarding or rigidity.  Skin: No rash.  Musculoskeletal: No edema.  Psychiatric: Appears normal.  Neurologic: Alert awake oriented to time place and person. Moves all extremities.  Labs on Admission:  Basic Metabolic Panel:  Recent Labs Lab 05/27/14 1815 05/27/14 2236  NA 135* 139  K 3.6* 3.1*  CL 89* 95*  CO2 23 25  GLUCOSE 408* 219*  BUN 25* 23  CREATININE 0.97 0.86  CALCIUM 10.3 9.5   Liver Function Tests:  Recent Labs Lab 05/27/14 1815  AST 44*  ALT 47*  ALKPHOS 102  BILITOT 0.4  PROT 7.9  ALBUMIN 4.1    Recent Labs Lab 05/27/14 1815  LIPASE 23   No results found for this basename: AMMONIA,  in the last 168 hours CBC:  Recent Labs Lab 05/27/14 1815  WBC 13.2*  NEUTROABS 11.0*  HGB 15.3*  HCT 44.5  MCV 92.9  PLT 338    Cardiac Enzymes: No results found for this basename: CKTOTAL, CKMB, CKMBINDEX, TROPONINI,  in the last 168 hours  BNP (last 3 results) No results found for this basename: PROBNP,  in the last 8760 hours CBG:  Recent Labs Lab 05/27/14 1809 05/27/14 2329  GLUCAP 378* 191*    Radiological Exams on Admission: Ct Abdomen Pelvis W Contrast  05/28/2014   CLINICAL DATA:  Diarrhea. Left-sided abdominal pain, nausea, vomiting and hyperglycemia. Lightheaded.  EXAM: CT ABDOMEN AND PELVIS WITH CONTRAST  TECHNIQUE: Multidetector CT imaging of the abdomen and pelvis was performed using the standard protocol following bolus administration of intravenous contrast.  CONTRAST:  157m OMNIPAQUE IOHEXOL 300 MG/ML  SOLN  COMPARISON:  None.  FINDINGS: The visualized lung bases are clear.  The liver and spleen are unremarkable in appearance. The gallbladder is within normal limits. There is bilateral prominence of the adrenal glands, somewhat nodular in appearance. The pancreas is unremarkable.  The kidneys are unremarkable in appearance. There is no evidence of hydronephrosis. No renal or ureteral stones are seen. No perinephric stranding is appreciated.  No free fluid is identified. The small bowel is unremarkable in appearance. The stomach is within normal limits. No acute vascular abnormalities are seen. Scattered mild calcification is seen along the abdominal aorta and its branches, with mild intramural thrombus.  The appendix is normal in caliber and contains air, without evidence for appendicitis. The colon is largely empty and unremarkable in appearance. There is fatty infiltration within the cecum and proximal ascending colon, likely reflecting sequelae of remote inflammation.  The bladder is mildly distended and grossly unremarkable. The uterus is within normal limits. The ovaries are relatively symmetric; no suspicious adnexal masses are seen. No inguinal lymphadenopathy is seen.  A small left inguinal hernia  is noted, containing only fat.  No acute osseous abnormalities are identified.  IMPRESSION: 1. No acute abnormality seen within the abdomen or pelvis. 2. Small left inguinal hernia, containing only fat. 3. Scattered mild calcification along the abdominal aorta and its branches, with mild associated intramural thrombus but no significant luminal narrowing. 4. Bilateral adrenal gland prominence raises concern for adrenal hyperplasia.   Electronically Signed   By: Garald Balding M.D.   On: 05/28/2014 00:30     Assessment/Plan Principal Problem:   Abdominal pain Active Problems:   Diabetes mellitus type II, uncontrolled   HTN (hypertension)   CAD (coronary artery disease)   HLD (hyperlipidemia)   1. Abdominal pain with nausea vomiting and diarrhea - given the lactic acid elevation I did discuss with Dr. Brantley Stage on call surgeon. Since patient's CT abdomen and pelvis does not show anything acute ischemic bowel is less likely. At this time surgery isn't met Shanon Brow IV fluids. We will keep patient n.p.o. except medications and empiric antibiotics. Check stool studies for C. difficile since patient was recently on antibiotics. Check for cultures. LFTs are mildly elevated and follow. Check acute hepatitis panel. 2. Diabetes mellitus type 2 uncontrolled - patient's anion gap was initially elevated. Repeat metabolic panel has been ordered along with acetone levels. If and intact distal pulses elevated then may start IV insulin infusion. For now I have placed patient on IV fluids and sliding scale coverage. Check hemoglobin A1c. Patient states that her sugar has been running high recently. 3. CAD status post stenting - denies any chest pain. Continue aspirin Plavix and metoprolol and statins. Hold statins if LFTs worsen. 4. Hypertension - hold ARB and diuretics for now. Patient is on when necessary IV hydralazine. 5. Lactic acidosis - see #1 and 2.    Code Status: Full code.  Family Communication: Family at  the bedside.  Disposition Plan: Admit to inpatient.    Sophia Sperry N. [facility] Pager [phone].  If 7PM-7AM, please contact night-coverage [email or web address] Password [email or web address] 05/28/2014, 1:55 AM

## 2014-05-28 NOTE — Progress Notes (Addendum)
ANTIBIOTIC CONSULT NOTE - INITIAL  Pharmacy Consult for cipro Indication: intraabdominal infection  Allergies  Allergen Reactions  . Codeine Nausea And Vomiting    But has tolerated morphine    Patient Measurements: Height: 5\' 2"  (157.5 cm) Weight: 186 lb 9.6 oz (84.641 kg) IBW/kg (Calculated) : 50.1  Vital Signs: Temp: 98.3 F (36.8 C) (06/19 0219) Temp src: Oral (06/18 2041) BP: 147/72 mmHg (06/19 0219) Pulse Rate: 88 (06/19 0219) Intake/Output from previous day:   Intake/Output from this shift:    Labs:  Recent Labs  05/27/14 1815 05/27/14 2236 05/28/14 0129  WBC 13.2*  --   --   HGB 15.3*  --   --   PLT 338  --   --   CREATININE 0.97 0.86 0.75   Estimated Creatinine Clearance: 77.3 ml/min (by C-G formula based on Cr of 0.75). No results found for this basename: VANCOTROUGH, VANCOPEAK, VANCORANDOM, GENTTROUGH, GENTPEAK, GENTRANDOM, TOBRATROUGH, TOBRAPEAK, TOBRARND, AMIKACINPEAK, AMIKACINTROU, AMIKACIN,  in the last 72 hours   Microbiology: No results found for this or any previous visit (from the past 720 hour(s)).  Medical History: Past Medical History  Diagnosis Date  . Hypertension   . Obesity (BMI 30-39.9)   . Hypercholesterolemia with hypertriglyceridemia   . Coronary artery disease     angioplasty 9/13  . Anginal pain   . Pneumonia 2012  . Exertional dyspnea     "usually; today it was with nothing" (11/03/2012)  . Type II diabetes mellitus 2007  . GERD (gastroesophageal reflux disease)   . Sinus headache     "weekly" (11/03/2012)  . Kidney stones     "I've had them 5 times; lithotripsy 1st time; flushed out  all the others" (11/03/2012)    Medications:  Prescriptions prior to admission  Medication Sig Dispense Refill  . acetaminophen (TYLENOL) 500 MG tablet Take 500 mg by mouth every 6 (six) hours as needed. For headache.      . Chlorphen-Phenyleph-APAP (TYLENOL SINUS CONGESTION/PAIN) 2-5-325 & 5-325 MG MISC Take 1 tablet by mouth daily as  needed. For sinus congestion.      . furosemide (LASIX) 20 MG tablet Take 1 tablet (20 mg total) by mouth as needed.  30 tablet  1  . loperamide (IMODIUM A-D) 2 MG tablet Take 2 mg by mouth 4 (four) times daily as needed. For diarrhea.      . losartan-hydrochlorothiazide (HYZAAR) 100-25 MG per tablet Take 1 tablet by mouth every evening.      . metFORMIN (GLUCOPHAGE) 500 MG tablet Take 1 tablet (500 mg total) by mouth 2 (two) times daily with a meal. HOLD for 48 hours, restart on 08/17/2012.  60 tablet  11  . metoprolol (LOPRESSOR) 50 MG tablet Take 50 mg by mouth 2 (two) times daily.      . nitroGLYCERIN (NITROSTAT) 0.4 MG SL tablet Place 1 tablet (0.4 mg total) under the tongue every 5 (five) minutes as needed for chest pain.  25 tablet  3  . pantoprazole (PROTONIX) 40 MG tablet Take 1 tablet (40 mg total) by mouth daily.  30 tablet  11  . amoxicillin (AMOXIL) 500 MG tablet Take 500 mg by mouth 2 (two) times daily. Patient not sure when she completed course of medication      . atorvastatin (LIPITOR) 40 MG tablet Take 1 tablet (40 mg total) by mouth daily.  30 tablet  11   Assessment: 58 yo lady to start cipro for r/o intraabdominal infection.  Her CrCl~77 ml/min  Goal of Therapy:  Eradication of infection  Plan:  Cipro 400mg  IV q12 hours Do not anticipate further changes in renal function requiring dose adjustment. Change to PO when appropriate. Rx will sign off.  Excell Seltzer Poteet 05/28/2014,4:46 AM

## 2014-05-28 NOTE — ED Notes (Signed)
PA at bedside.

## 2014-05-28 NOTE — ED Provider Notes (Signed)
Medical screening examination/treatment/procedure(s) were performed by non-physician practitioner and as supervising physician I was immediately available for consultation/collaboration.   EKG Interpretation   Date/Time:  Friday May 28 2014 01:47:25 EDT Ventricular Rate:  94 PR Interval:  168 QRS Duration: 79 QT Interval:  438 QTC Calculation: 548 R Axis:   22 Text Interpretation:  Sinus rhythm Low voltage, precordial leads  Nonspecific T abnormalities, diffuse leads Prolonged QT interval No  significant change since last tracing Confirmed by YAO  MD, DAVID ([id number])  on 05/28/2014 1:51:27 AM       Richarda Blade, MD 05/28/14 1501

## 2014-05-29 DIAGNOSIS — IMO0001 Reserved for inherently not codable concepts without codable children: Secondary | ICD-10-CM

## 2014-05-29 DIAGNOSIS — I2584 Coronary atherosclerosis due to calcified coronary lesion: Secondary | ICD-10-CM

## 2014-05-29 DIAGNOSIS — I10 Essential (primary) hypertension: Secondary | ICD-10-CM

## 2014-05-29 DIAGNOSIS — A09 Infectious gastroenteritis and colitis, unspecified: Principal | ICD-10-CM

## 2014-05-29 DIAGNOSIS — I2510 Atherosclerotic heart disease of native coronary artery without angina pectoris: Secondary | ICD-10-CM

## 2014-05-29 DIAGNOSIS — R109 Unspecified abdominal pain: Secondary | ICD-10-CM

## 2014-05-29 DIAGNOSIS — E1165 Type 2 diabetes mellitus with hyperglycemia: Secondary | ICD-10-CM

## 2014-05-29 LAB — BASIC METABOLIC PANEL
BUN: 14 mg/dL (ref 6–23)
CHLORIDE: 100 meq/L (ref 96–112)
CO2: 23 mEq/L (ref 19–32)
CREATININE: 0.79 mg/dL (ref 0.50–1.10)
Calcium: 8.7 mg/dL (ref 8.4–10.5)
GFR calc non Af Amer: >90 mL/min (ref >90)
GLUCOSE: 220 mg/dL — AB (ref 70–99)
POTASSIUM: 3.8 meq/L (ref 3.7–5.3)
Sodium: 141 mEq/L (ref 137–147)

## 2014-05-29 LAB — CBC
HEMATOCRIT: 38.1 % (ref 36.0–46.0)
HEMOGLOBIN: 12.4 g/dL (ref 12.0–15.0)
MCH: 30.5 pg (ref 26.0–34.0)
MCHC: 32.5 g/dL (ref 30.0–36.0)
MCV: 93.8 fL (ref 78.0–100.0)
Platelets: 272 10*3/uL (ref 150–400)
RBC: 4.06 MIL/uL (ref 3.87–5.11)
RDW: 12.4 % (ref 11.5–15.5)
WBC: 5.5 10*3/uL (ref 4.0–10.5)

## 2014-05-29 LAB — GLUCOSE, CAPILLARY
GLUCOSE-CAPILLARY: 209 mg/dL — AB (ref 70–99)
GLUCOSE-CAPILLARY: 207 mg/dL — AB (ref 70–99)

## 2014-05-29 LAB — LACTIC ACID, PLASMA: Lactic Acid, Venous: 3.3 mmol/L — ABNORMAL HIGH (ref 0.5–2.2)

## 2014-05-29 MED ORDER — METRONIDAZOLE 250 MG PO TABS: 250.0000 mg | ORAL_TABLET | Freq: Three times a day (TID) | ORAL | Status: DC

## 2014-05-29 MED ORDER — UNABLE TO FIND: Status: DC

## 2014-05-29 MED ORDER — CIPROFLOXACIN HCL 250 MG PO TABS: 250.0000 mg | ORAL_TABLET | Freq: Two times a day (BID) | ORAL | Status: DC

## 2014-05-29 MED ORDER — METFORMIN HCL 500 MG PO TABS: 500.0000 mg | ORAL_TABLET | Freq: Two times a day (BID) | ORAL | Status: DC

## 2014-05-29 MED ORDER — HYDROCODONE-ACETAMINOPHEN 5-300 MG PO TABS: 1.0000 | ORAL_TABLET | Freq: Four times a day (QID) | ORAL | Status: DC | PRN

## 2014-05-29 MED ORDER — GLIPIZIDE 5 MG PO TABS: 5.0000 mg | ORAL_TABLET | Freq: Every day | ORAL | Status: DC

## 2014-05-29 NOTE — Progress Notes (Signed)
Pt discharged per MD. AVS instructions reviewed. Clarifications from MD: Pt to resume metformin tomorrow 05/30/2014. Pt is able to resume work 6/23. Pt was admitted 6/18-6/20. Pt  Verbalized understanding. NSL removed with catheter intact. Prescriptions provided along with note for employer. Manya Silvas, RN

## 2014-05-29 NOTE — Progress Notes (Signed)
Pt escorted out by family per pt choice. Denied wheelchair. Manya Silvas, RN

## 2014-05-31 LAB — GLUCOSE, CAPILLARY: Glucose-Capillary: 250 mg/dL — ABNORMAL HIGH (ref 70–99)

## 2014-06-09 ENCOUNTER — Telehealth: Payer: Self-pay

## 2014-06-14 NOTE — Telephone Encounter (Signed)
Refill and schedule follow up.

## 2014-06-16 ENCOUNTER — Other Ambulatory Visit: Payer: Self-pay

## 2014-06-16 ENCOUNTER — Telehealth: Payer: Self-pay

## 2014-06-16 MED ORDER — ATORVASTATIN CALCIUM 40 MG PO TABS: 40.0000 mg | ORAL_TABLET | Freq: Every day | ORAL | Status: DC

## 2014-06-17 NOTE — Discharge Summary (Signed)
Physician Discharge Summary  Amanda Mendez YTK:[id number] DOB: [birth year] DOA: 05/27/2014  PCP: Sherrie Mustache, MD  Admit date: 05/27/2014 Discharge date: 05/29/2014  Time spent: 35 minutes  Recommendations for Outpatient Follow-up:  1. PCP in 1 week  Discharge Diagnoses:  Principal Problem: Gastroenteritis Active Problems:   Diabetes mellitus type II, uncontrolled   HTN (hypertension)   CAD (coronary artery disease)   HLD (hyperlipidemia)   Gastroenteritis, infectious   Discharge Condition: stable  Diet recommendation: diabetic  Filed Weights   05/27/14 1810 05/28/14 0219  Weight: 83.462 kg (184 lb) 84.641 kg (186 lb 9.6 oz)    History of present illness:  Chief Complaint: Abdominal pain nausea vomiting and diarrhea.  HPI: Amanda Mendez is a 58 y.o. female history of CAD status post stenting, diabetes mellitus, hypertension, hyperlipidemia started experiencing nausea vomiting abdominal pain and diarrhea 3 days ago which has been persistently worsening. Pain was initially the left lower current which has started moving across the abdomen. Pain is sometimes colicky but patient has persistent underlying pain. Patient denies any blood in the vomitus or diarrhea. Denies any fever chills. In the ER labs reveal lactic acidosis with high anion gap and elevated blood sugar. Patient's blood sugar improved with IV fluids normal saline 2 L bolus. Denies any chest pain shortness of breath. CT abdomen and pelvis does not show anything acute. Patient still has abdominal pain which improves with pain medication. Patient did use antibiotics last month for tooth ache.    Hospital Course:  57/F admitted with N/V/Diarrhea due to acute likely infectious gastroenteritis. Treated with supportive care, empiric abx  CT unremarkable, Cdiff PCR negative Clinically improving and discharged home in stable condition on empiric course of antibiotics. Rest of medical problems were stable  Discharge  Exam: Filed Vitals:   05/29/14 1000  BP: 145/74  Pulse: 79  Temp: 98.6 F (37 C)  Resp: 18    General: AAOx3 Cardiovascular: S1S2/RRR Respiratory: CTAB  Discharge Instructions You were cared for by a hospitalist during your hospital stay. If you have any questions about your discharge medications or the care you received while you were in the hospital after you are discharged, you can call the unit and asked to speak with the hospitalist on call if the hospitalist that took care of you is not available. Once you are discharged, your primary care physician will handle any further medical issues. Please note that NO REFILLS for any discharge medications will be authorized once you are discharged, as it is imperative that you return to your primary care physician (or establish a relationship with a primary care physician if you do not have one) for your aftercare needs so that they can reassess your need for medications and monitor your lab values.  Discharge Instructions   Discharge instructions    Complete by:  As directed   Bland diabetic diet for 2-3days then advance as tolerated     Increase activity slowly    Complete by:  As directed             Medication List    STOP taking these medications       amoxicillin 500 MG tablet  Commonly known as:  AMOXIL      TAKE these medications       acetaminophen 500 MG tablet  Commonly known as:  TYLENOL  Take 500 mg by mouth every 6 (six) hours as needed. For headache.     ciprofloxacin 250 MG tablet  Commonly known as:  CIPRO  Take 1 tablet (250 mg total) by mouth 2 (two) times daily. For 8 days     furosemide 20 MG tablet  Commonly known as:  LASIX  Take 1 tablet (20 mg total) by mouth as needed.     glipiZIDE 5 MG tablet  Commonly known as:  GLUCOTROL  Take 1 tablet (5 mg total) by mouth daily before breakfast.     Hydrocodone-Acetaminophen 5-300 MG Tabs  Commonly known as:  VICODIN  Take 1 tablet by mouth every 6  (six) hours as needed. For moderate to severe pain     loperamide 2 MG tablet  Commonly known as:  IMODIUM A-D  Take 2 mg by mouth 4 (four) times daily as needed. For diarrhea.     losartan-hydrochlorothiazide 100-25 MG per tablet  Commonly known as:  HYZAAR  Take 1 tablet by mouth every evening.     metFORMIN 500 MG tablet  Commonly known as:  GLUCOPHAGE  Take 1 tablet (500 mg total) by mouth 2 (two) times daily with a meal.     metoprolol 50 MG tablet  Commonly known as:  LOPRESSOR  Take 50 mg by mouth 2 (two) times daily.     metroNIDAZOLE 250 MG tablet  Commonly known as:  FLAGYL  Take 1 tablet (250 mg total) by mouth 3 (three) times daily. For 8 days     nitroGLYCERIN 0.4 MG SL tablet  Commonly known as:  NITROSTAT  Place 1 tablet (0.4 mg total) under the tongue every 5 (five) minutes as needed for chest pain.     pantoprazole 40 MG tablet  Commonly known as:  PROTONIX  Take 1 tablet (40 mg total) by mouth daily.     TYLENOL SINUS CONGESTION/PAIN 2-5-325 & 5-325 MG Misc  Generic drug:  Chlorphen-Phenyleph-APAP  Take 1 tablet by mouth daily as needed. For sinus congestion.     UNABLE TO FIND  This note is to excuse Ms.Forgette from work due to medical illness requiring hospitalization until 6/23       Allergies  Allergen Reactions  . Codeine Nausea And Vomiting    But has tolerated morphine       Follow-up Information   Follow up with Sherrie Mustache, MD. Schedule an appointment as soon as possible for a visit in 1 week.   Specialty:  Family Medicine   Contact information:   [place] [id number] [phone]        The results of significant diagnostics from this hospitalization (including imaging, microbiology, ancillary and laboratory) are listed below for reference.    Significant Diagnostic Studies: Ct Abdomen Pelvis W Contrast  05/28/2014   CLINICAL DATA:  Diarrhea. Left-sided abdominal pain, nausea, vomiting and hyperglycemia.  Lightheaded.  EXAM: CT ABDOMEN AND PELVIS WITH CONTRAST  TECHNIQUE: Multidetector CT imaging of the abdomen and pelvis was performed using the standard protocol following bolus administration of intravenous contrast.  CONTRAST:  171mL OMNIPAQUE IOHEXOL 300 MG/ML  SOLN  COMPARISON:  None.  FINDINGS: The visualized lung bases are clear.  The liver and spleen are unremarkable in appearance. The gallbladder is within normal limits. There is bilateral prominence of the adrenal glands, somewhat nodular in appearance. The pancreas is unremarkable.  The kidneys are unremarkable in appearance. There is no evidence of hydronephrosis. No renal or ureteral stones are seen. No perinephric stranding is appreciated.  No free fluid is identified. The small bowel is unremarkable in appearance. The stomach is within normal limits. No  acute vascular abnormalities are seen. Scattered mild calcification is seen along the abdominal aorta and its branches, with mild intramural thrombus.  The appendix is normal in caliber and contains air, without evidence for appendicitis. The colon is largely empty and unremarkable in appearance. There is fatty infiltration within the cecum and proximal ascending colon, likely reflecting sequelae of remote inflammation.  The bladder is mildly distended and grossly unremarkable. The uterus is within normal limits. The ovaries are relatively symmetric; no suspicious adnexal masses are seen. No inguinal lymphadenopathy is seen.  A small left inguinal hernia is noted, containing only fat.  No acute osseous abnormalities are identified.  IMPRESSION: 1. No acute abnormality seen within the abdomen or pelvis. 2. Small left inguinal hernia, containing only fat. 3. Scattered mild calcification along the abdominal aorta and its branches, with mild associated intramural thrombus but no significant luminal narrowing. 4. Bilateral adrenal gland prominence raises concern for adrenal hyperplasia.   Electronically  Signed   By: Garald Balding M.D.   On: 05/28/2014 00:30    Microbiology: No results found for this or any previous visit (from the past 240 hour(s)).   Labs: Basic Metabolic Panel: No results found for this basename: NA, K, CL, CO2, GLUCOSE, BUN, CREATININE, CALCIUM, MG, PHOS,  in the last 168 hours Liver Function Tests: No results found for this basename: AST, ALT, ALKPHOS, BILITOT, PROT, ALBUMIN,  in the last 168 hours No results found for this basename: LIPASE, AMYLASE,  in the last 168 hours No results found for this basename: AMMONIA,  in the last 168 hours CBC: No results found for this basename: WBC, NEUTROABS, HGB, HCT, MCV, PLT,  in the last 168 hours Cardiac Enzymes: No results found for this basename: CKTOTAL, CKMB, CKMBINDEX, TROPONINI,  in the last 168 hours BNP: BNP (last 3 results) No results found for this basename: PROBNP,  in the last 8760 hours CBG: No results found for this basename: GLUCAP,  in the last 168 hours     Signed:  Ruhani Umland  [facility] 06/17/2014, 9:33 PM

## 2014-06-22 ENCOUNTER — Other Ambulatory Visit (HOSPITAL_COMMUNITY): Payer: Self-pay | Admitting: Adult Health Nurse Practitioner

## 2014-06-22 DIAGNOSIS — I7409 Other arterial embolism and thrombosis of abdominal aorta: Secondary | ICD-10-CM

## 2014-06-23 NOTE — Telephone Encounter (Signed)
Call patient to schedule appointment with Dr Percival Spanish

## 2014-07-06 ENCOUNTER — Ambulatory Visit (HOSPITAL_COMMUNITY)
Admission: RE | Admit: 2014-07-06 | Discharge: 2014-07-06 | Disposition: A | Discharge status: Home / Self Care | Payer: BC Managed Care – PPO | Source: Ambulatory Visit | Attending: Adult Health Nurse Practitioner | Admitting: Adult Health Nurse Practitioner

## 2014-09-22 ENCOUNTER — Encounter: Payer: Self-pay | Admitting: Cardiology

## 2014-09-22 ENCOUNTER — Ambulatory Visit (INDEPENDENT_AMBULATORY_CARE_PROVIDER_SITE_OTHER): Payer: BC Managed Care – PPO | Admitting: Cardiology

## 2014-09-22 VITALS — BP 140/82 | HR 85 | Ht 62.0 in | Wt 198.0 lb

## 2014-09-22 DIAGNOSIS — I10 Essential (primary) hypertension: Secondary | ICD-10-CM

## 2014-09-22 DIAGNOSIS — R011 Cardiac murmur, unspecified: Secondary | ICD-10-CM

## 2014-09-22 DIAGNOSIS — I2510 Atherosclerotic heart disease of native coronary artery without angina pectoris: Secondary | ICD-10-CM

## 2014-09-22 NOTE — Patient Instructions (Addendum)
The current medical regimen is effective;  continue present plan and medications.  Your physician has requested that you have an echocardiogram. Echocardiography is a painless test that uses sound waves to create images of your heart. It provides your doctor with information about the size and shape of your heart and how well your heart's chambers and valves are working. This procedure takes approximately one hour. There are no restrictions for this procedure.  This will be completed at [facility].  Please check in at the main entrance.   Follow up in 1 year with Dr. Percival Spanish .  You will receive a letter in the mail 2 months before you are due.  Please call us when you receive this letter to schedule your follow up appointment.

## 2014-09-22 NOTE — Progress Notes (Signed)
HPI The patient presents for followup of CAD.  She has had PCI of an OM vessel in the past.  She was recently in the hospital and reviewed these records. She had gastroenteritis. Of note there was mention of a thrombus in her aorta and Dr. Edrick Oh is sending her for reimaging of this. The patient does have lower externally swelling which she says is worse in the day when she's at work even though she's not standing all day. It does go away is not at work. She's not describing any new shortness of breath, PND or orthopnea area she's not describing any chest pressure, neck or arm discomfort.  Allergies  Allergen Reactions  . Codeine Nausea And Vomiting    But has tolerated morphine    Current Outpatient Prescriptions  Medication Sig Dispense Refill  . acetaminophen (TYLENOL) 500 MG tablet Take 500 mg by mouth every 6 (six) hours as needed. For headache.      Marland Kitchen amLODipine (NORVASC) 10 MG tablet TAKE ONE TABLET BY MOUTH ONCE DAILY      . atorvastatin (LIPITOR) 40 MG tablet Take 1 tablet (40 mg total) by mouth daily.  30 tablet  0  . Canagliflozin (INVOKANA) 100 MG TABS Take 100 mg by mouth daily.      . Chlorphen-Phenyleph-APAP (TYLENOL SINUS CONGESTION/PAIN) 2-5-325 & 5-325 MG MISC Take 1 tablet by mouth daily as needed. For sinus congestion.      . clopidogrel (PLAVIX) 75 MG tablet Take 75 mg by mouth daily.      . ergocalciferol (VITAMIN D2) 50000 UNITS capsule Take 50,000 Units by mouth.      . furosemide (LASIX) 20 MG tablet Take 1 tablet (20 mg total) by mouth as needed.  30 tablet  1  . glipiZIDE (GLUCOTROL) 5 MG tablet Take 1 tablet (5 mg total) by mouth daily before breakfast.  30 tablet  0  . Hydrocodone-Acetaminophen (VICODIN) 5-300 MG TABS Take 1 tablet by mouth every 6 (six) hours as needed. For moderate to severe pain  25 each  0  . loperamide (IMODIUM A-D) 2 MG tablet Take 2 mg by mouth 4 (four) times daily as needed. For diarrhea.      . losartan-hydrochlorothiazide (HYZAAR)  100-25 MG per tablet Take 1 tablet by mouth every evening.      . metFORMIN (GLUCOPHAGE) 500 MG tablet Take 1 tablet (500 mg total) by mouth 2 (two) times daily with a meal.  60 tablet  11  . metoprolol (LOPRESSOR) 50 MG tablet Take 50 mg by mouth 2 (two) times daily.      . nitroGLYCERIN (NITROSTAT) 0.4 MG SL tablet Place 1 tablet (0.4 mg total) under the tongue every 5 (five) minutes as needed for chest pain.  25 tablet  3  . pantoprazole (PROTONIX) 40 MG tablet Take 1 tablet (40 mg total) by mouth daily.  30 tablet  11  . UNABLE TO FIND This note is to excuse Ms.Amanda Mendez from work due to medical illness requiring hospitalization until 6/23  1 %  0   No current facility-administered medications for this visit.    Past Medical History  Diagnosis Date  . Hypertension   . Obesity (BMI 30-39.9)   . Hypercholesterolemia with hypertriglyceridemia   . Coronary artery disease     angioplasty 9/13  . Anginal pain   . Pneumonia 2012  . Exertional dyspnea     "usually; today it was with nothing" (11/03/2012)  . Type II diabetes mellitus 2007  .  GERD (gastroesophageal reflux disease)   . Sinus headache     "weekly" (11/03/2012)  . Kidney stones     "I've had them 5 times; lithotripsy 1st time; flushed out  all the others" (11/03/2012)    Past Surgical History  Procedure Laterality Date  . Tubal ligation  1980  . Hematoma evacuation  09/22/2012    Procedure: EVACUATION HEMATOMA;  Surgeon: Wynonia Sours, MD;  Location: [facility];  Service: Orthopedics;  Laterality: Right;  Evacuation Hematoma right hand  . Coronary angioplasty with stent placement  08/2012    "1"  . Cardiac catheterization  11/25/29013  . Skin biopsy  2012    "off my back; it was nothing fatal; infection caused it" (11/03/2012)  . Lithotripsy  1990's    ROS:  Pain in the top of her right foot.  Otherwise as stated in the HPI and negative for all other systems.  PHYSICAL EXAM BP 140/82  Pulse 85  Ht 5'  2" (1.575 m)  Wt 198 lb (89.812 kg)  BMI 36.21 kg/m2 GENERAL:  Well appearing HEENT:  Pupils equal round and reactive, fundi not visualized, oral mucosa unremarkable NECK:  No jugular venous distention, waveform within normal limits, carotid upstroke brisk and symmetric, no bruits, no thyromegaly LYMPHATICS:  No cervical, inguinal adenopathy LUNGS:  Clear to auscultation bilaterally BACK:  No CVA tenderness CHEST:  Unremarkable HEART:  PMI not displaced or sustained,S1 and S2 within normal limits, no S3, no S4, no clicks, no rubs, no murmurs ABD:  Flat, positive bowel sounds normal in frequency in pitch, no bruits, no rebound, no guarding, no midline pulsatile mass, no hepatomegaly, no splenomegaly, obese EXT:  2 plus pulses throughout, mild bilateral ankle edema, no cyanosis no clubbing. SKIN:  No rashes no nodules  EKG: Sinus rhythm, rate 85, axis within normal limits, intervals within normal limits, nonspecific T-wave flattening. 09/22/2014   ASSESSMENT AND PLAN  CAD - At this point I don't think her symptoms or related to her coronary disease. No further testing is indicated. She needs aggressive risk reduction.  Diabetes mellitus  -  I reviewed with her I hemoglobin A1c in the hospital with 10.5.  However, her medications have been changed. She's having this followed closely by Dr. Edrick Oh  Hypertension - The blood pressure is at target. No change in medications is indicated. We will continue with therapeutic lifestyle changes (TLC).  Tobacco abuse - She quit smoking!  Obesity  - I will continue to educate her about diet and exercise.  Hypercholesterolemia with hypertriglyceridemia - This is followed by Dr. Edrick Oh  Diastolic heart failure - She does have some edema.  I don't suspect any left sided failure.  For now she will continue the meds as listed.   Murmur - I will check an echocardiogram.    Aortic Thrombus - Dr. Edrick Oh has plans to reimage this. Typically these  are not treated with anticoagulation.

## 2014-09-24 ENCOUNTER — Ambulatory Visit (HOSPITAL_COMMUNITY)
Admission: RE | Admit: 2014-09-24 | Discharge: 2014-09-24 | Disposition: A | Discharge status: Home / Self Care | Payer: BC Managed Care – PPO | Source: Ambulatory Visit | Attending: Cardiology | Admitting: Cardiology

## 2014-09-24 DIAGNOSIS — E669 Obesity, unspecified: Secondary | ICD-10-CM | POA: Insufficient documentation

## 2014-09-24 DIAGNOSIS — R0600 Dyspnea, unspecified: Secondary | ICD-10-CM | POA: Diagnosis not present

## 2014-09-24 DIAGNOSIS — Z87891 Personal history of nicotine dependence: Secondary | ICD-10-CM | POA: Diagnosis not present

## 2014-09-24 DIAGNOSIS — I2510 Atherosclerotic heart disease of native coronary artery without angina pectoris: Secondary | ICD-10-CM | POA: Insufficient documentation

## 2014-09-24 DIAGNOSIS — I509 Heart failure, unspecified: Secondary | ICD-10-CM | POA: Insufficient documentation

## 2014-09-24 DIAGNOSIS — Z6836 Body mass index (BMI) 36.0-36.9, adult: Secondary | ICD-10-CM | POA: Insufficient documentation

## 2014-09-24 DIAGNOSIS — R011 Cardiac murmur, unspecified: Secondary | ICD-10-CM | POA: Diagnosis not present

## 2014-09-24 DIAGNOSIS — I517 Cardiomegaly: Secondary | ICD-10-CM

## 2014-09-24 DIAGNOSIS — I10 Essential (primary) hypertension: Secondary | ICD-10-CM

## 2014-09-24 NOTE — Progress Notes (Signed)
  Echocardiogram 2D Echocardiogram has been performed.  [place], [place] 09/24/2014, 2:57 PM

## 2014-11-18 ENCOUNTER — Encounter (HOSPITAL_COMMUNITY): Payer: Self-pay | Admitting: Cardiology

## 2015-02-03 ENCOUNTER — Other Ambulatory Visit: Payer: Self-pay

## 2015-02-03 MED ORDER — FUROSEMIDE 20 MG PO TABS: 20.0000 mg | ORAL_TABLET | ORAL | Status: DC | PRN

## 2015-02-03 NOTE — Telephone Encounter (Signed)
Minus Breeding, MD at 09/22/2014 4:16 PM  furosemide (LASIX) 20 MG tabletTake 1 tablet (20 mg total) by mouth as needed Diastolic heart failure - She does have some edema. I don't suspect any left sided failure. For now she will continue the meds as listed.

## 2015-03-24 DIAGNOSIS — E669 Obesity, unspecified: Secondary | ICD-10-CM | POA: Insufficient documentation

## 2015-06-16 DIAGNOSIS — E559 Vitamin D deficiency, unspecified: Secondary | ICD-10-CM | POA: Insufficient documentation

## 2016-03-26 DIAGNOSIS — E785 Hyperlipidemia, unspecified: Secondary | ICD-10-CM | POA: Insufficient documentation

## 2016-06-25 DIAGNOSIS — K219 Gastro-esophageal reflux disease without esophagitis: Secondary | ICD-10-CM | POA: Insufficient documentation

## 2016-07-29 ENCOUNTER — Other Ambulatory Visit: Payer: Self-pay | Admitting: Cardiology

## 2016-07-31 ENCOUNTER — Other Ambulatory Visit: Payer: Self-pay | Admitting: Cardiology

## 2016-08-29 ENCOUNTER — Other Ambulatory Visit: Payer: Self-pay | Admitting: Cardiology

## 2016-08-31 ENCOUNTER — Other Ambulatory Visit: Payer: Self-pay | Admitting: Cardiology

## 2016-09-25 DIAGNOSIS — Z23 Encounter for immunization: Secondary | ICD-10-CM | POA: Insufficient documentation

## 2016-10-05 ENCOUNTER — Other Ambulatory Visit: Payer: Self-pay | Admitting: Cardiology

## 2016-12-04 ENCOUNTER — Other Ambulatory Visit: Payer: Self-pay

## 2016-12-04 MED ORDER — FUROSEMIDE 20 MG PO TABS: 20.0000 mg | ORAL_TABLET | Freq: Every day | ORAL | 0 refills | Status: DC

## 2016-12-04 NOTE — Telephone Encounter (Signed)
REFILL 

## 2016-12-05 ENCOUNTER — Telehealth: Payer: Self-pay | Admitting: Cardiology

## 2016-12-05 NOTE — Telephone Encounter (Signed)
Did not need this encounter °

## 2020-05-29 DIAGNOSIS — S8992XA Unspecified injury of left lower leg, initial encounter: Secondary | ICD-10-CM | POA: Diagnosis not present

## 2020-05-29 DIAGNOSIS — W208XXA Other cause of strike by thrown, projected or falling object, initial encounter: Secondary | ICD-10-CM | POA: Diagnosis not present

## 2020-05-29 DIAGNOSIS — M79662 Pain in left lower leg: Secondary | ICD-10-CM | POA: Diagnosis not present

## 2020-05-29 DIAGNOSIS — Z23 Encounter for immunization: Secondary | ICD-10-CM | POA: Diagnosis not present

## 2020-05-29 DIAGNOSIS — W228XXA Striking against or struck by other objects, initial encounter: Secondary | ICD-10-CM | POA: Diagnosis not present

## 2020-05-29 DIAGNOSIS — S80812A Abrasion, left lower leg, initial encounter: Secondary | ICD-10-CM | POA: Diagnosis not present

## 2020-06-02 DIAGNOSIS — M47892 Other spondylosis, cervical region: Secondary | ICD-10-CM | POA: Diagnosis not present

## 2020-06-02 DIAGNOSIS — M79603 Pain in arm, unspecified: Secondary | ICD-10-CM | POA: Diagnosis not present

## 2020-06-02 DIAGNOSIS — R9431 Abnormal electrocardiogram [ECG] [EKG]: Secondary | ICD-10-CM | POA: Diagnosis not present

## 2020-06-02 DIAGNOSIS — R1084 Generalized abdominal pain: Secondary | ICD-10-CM | POA: Diagnosis not present

## 2020-06-02 DIAGNOSIS — S8991XA Unspecified injury of right lower leg, initial encounter: Secondary | ICD-10-CM | POA: Diagnosis not present

## 2020-06-02 DIAGNOSIS — Z955 Presence of coronary angioplasty implant and graft: Secondary | ICD-10-CM | POA: Diagnosis not present

## 2020-06-02 DIAGNOSIS — R519 Headache, unspecified: Secondary | ICD-10-CM | POA: Diagnosis not present

## 2020-06-02 DIAGNOSIS — K760 Fatty (change of) liver, not elsewhere classified: Secondary | ICD-10-CM | POA: Diagnosis not present

## 2020-06-02 DIAGNOSIS — E119 Type 2 diabetes mellitus without complications: Secondary | ICD-10-CM | POA: Diagnosis not present

## 2020-06-02 DIAGNOSIS — E1165 Type 2 diabetes mellitus with hyperglycemia: Secondary | ICD-10-CM | POA: Diagnosis not present

## 2020-06-02 DIAGNOSIS — S0990XA Unspecified injury of head, initial encounter: Secondary | ICD-10-CM | POA: Diagnosis not present

## 2020-06-02 DIAGNOSIS — M549 Dorsalgia, unspecified: Secondary | ICD-10-CM | POA: Diagnosis not present

## 2020-06-02 DIAGNOSIS — S299XXA Unspecified injury of thorax, initial encounter: Secondary | ICD-10-CM | POA: Diagnosis not present

## 2020-06-02 DIAGNOSIS — I700 Atherosclerosis of aorta: Secondary | ICD-10-CM | POA: Diagnosis not present

## 2020-06-02 DIAGNOSIS — S3991XA Unspecified injury of abdomen, initial encounter: Secondary | ICD-10-CM | POA: Diagnosis not present

## 2020-06-02 DIAGNOSIS — T1490XA Injury, unspecified, initial encounter: Secondary | ICD-10-CM | POA: Diagnosis not present

## 2020-06-02 DIAGNOSIS — R52 Pain, unspecified: Secondary | ICD-10-CM | POA: Diagnosis not present

## 2020-06-02 DIAGNOSIS — S199XXA Unspecified injury of neck, initial encounter: Secondary | ICD-10-CM | POA: Diagnosis not present

## 2020-06-02 DIAGNOSIS — S139XXA Sprain of joints and ligaments of unspecified parts of neck, initial encounter: Secondary | ICD-10-CM | POA: Diagnosis not present

## 2020-06-02 DIAGNOSIS — T300 Burn of unspecified body region, unspecified degree: Secondary | ICD-10-CM | POA: Diagnosis not present

## 2020-06-02 DIAGNOSIS — R110 Nausea: Secondary | ICD-10-CM | POA: Diagnosis not present

## 2020-06-02 DIAGNOSIS — I10 Essential (primary) hypertension: Secondary | ICD-10-CM | POA: Diagnosis not present

## 2020-06-02 DIAGNOSIS — S060X0A Concussion without loss of consciousness, initial encounter: Secondary | ICD-10-CM | POA: Diagnosis not present

## 2020-06-10 DIAGNOSIS — E278 Other specified disorders of adrenal gland: Secondary | ICD-10-CM | POA: Diagnosis not present

## 2020-06-10 DIAGNOSIS — S301XXA Contusion of abdominal wall, initial encounter: Secondary | ICD-10-CM | POA: Diagnosis not present

## 2020-06-10 DIAGNOSIS — R209 Unspecified disturbances of skin sensation: Secondary | ICD-10-CM | POA: Diagnosis not present

## 2020-06-10 DIAGNOSIS — E6609 Other obesity due to excess calories: Secondary | ICD-10-CM | POA: Diagnosis not present

## 2020-06-10 DIAGNOSIS — R109 Unspecified abdominal pain: Secondary | ICD-10-CM | POA: Diagnosis not present

## 2020-06-10 DIAGNOSIS — R531 Weakness: Secondary | ICD-10-CM | POA: Diagnosis not present

## 2020-06-10 DIAGNOSIS — S301XXD Contusion of abdominal wall, subsequent encounter: Secondary | ICD-10-CM | POA: Diagnosis not present

## 2020-06-10 DIAGNOSIS — I700 Atherosclerosis of aorta: Secondary | ICD-10-CM | POA: Diagnosis not present

## 2020-06-10 DIAGNOSIS — E119 Type 2 diabetes mellitus without complications: Secondary | ICD-10-CM | POA: Diagnosis not present

## 2020-06-10 DIAGNOSIS — R519 Headache, unspecified: Secondary | ICD-10-CM | POA: Diagnosis not present

## 2020-06-10 DIAGNOSIS — S0990XA Unspecified injury of head, initial encounter: Secondary | ICD-10-CM | POA: Diagnosis not present

## 2020-06-10 DIAGNOSIS — R7309 Other abnormal glucose: Secondary | ICD-10-CM | POA: Diagnosis not present

## 2020-06-10 DIAGNOSIS — Z1389 Encounter for screening for other disorder: Secondary | ICD-10-CM | POA: Diagnosis not present

## 2020-06-10 DIAGNOSIS — Z955 Presence of coronary angioplasty implant and graft: Secondary | ICD-10-CM | POA: Diagnosis not present

## 2020-06-10 DIAGNOSIS — Z6832 Body mass index (BMI) 32.0-32.9, adult: Secondary | ICD-10-CM | POA: Diagnosis not present

## 2020-06-10 DIAGNOSIS — J3489 Other specified disorders of nose and nasal sinuses: Secondary | ICD-10-CM | POA: Diagnosis not present

## 2020-06-10 DIAGNOSIS — K760 Fatty (change of) liver, not elsewhere classified: Secondary | ICD-10-CM | POA: Diagnosis not present

## 2020-06-10 DIAGNOSIS — I6782 Cerebral ischemia: Secondary | ICD-10-CM | POA: Diagnosis not present

## 2020-06-10 DIAGNOSIS — N3289 Other specified disorders of bladder: Secondary | ICD-10-CM | POA: Diagnosis not present

## 2020-06-10 DIAGNOSIS — J0120 Acute ethmoidal sinusitis, unspecified: Secondary | ICD-10-CM | POA: Diagnosis not present

## 2020-06-10 DIAGNOSIS — I10 Essential (primary) hypertension: Secondary | ICD-10-CM | POA: Diagnosis not present

## 2020-06-10 DIAGNOSIS — Z79899 Other long term (current) drug therapy: Secondary | ICD-10-CM | POA: Diagnosis not present

## 2020-06-10 DIAGNOSIS — S32010A Wedge compression fracture of first lumbar vertebra, initial encounter for closed fracture: Secondary | ICD-10-CM | POA: Diagnosis not present

## 2020-06-22 ENCOUNTER — Encounter: Payer: Self-pay | Admitting: Endocrinology

## 2020-06-22 LAB — HEMOGLOBIN A1C
A1c: 13.7
A1c: 9.8

## 2020-07-20 ENCOUNTER — Ambulatory Visit: Payer: BLUE CROSS/BLUE SHIELD | Admitting: Endocrinology

## 2020-08-03 ENCOUNTER — Ambulatory Visit: Payer: BLUE CROSS/BLUE SHIELD | Admitting: Endocrinology

## 2020-08-03 DIAGNOSIS — E118 Type 2 diabetes mellitus with unspecified complications: Secondary | ICD-10-CM | POA: Diagnosis not present

## 2020-08-03 DIAGNOSIS — M79606 Pain in leg, unspecified: Secondary | ICD-10-CM | POA: Diagnosis not present

## 2020-08-03 DIAGNOSIS — I10 Essential (primary) hypertension: Secondary | ICD-10-CM | POA: Diagnosis not present

## 2020-08-10 DIAGNOSIS — I10 Essential (primary) hypertension: Secondary | ICD-10-CM | POA: Diagnosis not present

## 2020-08-10 DIAGNOSIS — E119 Type 2 diabetes mellitus without complications: Secondary | ICD-10-CM | POA: Diagnosis not present

## 2020-08-10 DIAGNOSIS — M79604 Pain in right leg: Secondary | ICD-10-CM | POA: Diagnosis not present

## 2020-08-10 DIAGNOSIS — G5711 Meralgia paresthetica, right lower limb: Secondary | ICD-10-CM | POA: Diagnosis not present

## 2020-09-26 ENCOUNTER — Ambulatory Visit: Payer: BLUE CROSS/BLUE SHIELD | Admitting: Endocrinology

## 2020-10-06 DIAGNOSIS — M25532 Pain in left wrist: Secondary | ICD-10-CM | POA: Diagnosis not present

## 2020-10-06 DIAGNOSIS — T07XXXA Unspecified multiple injuries, initial encounter: Secondary | ICD-10-CM | POA: Diagnosis not present

## 2020-10-06 DIAGNOSIS — Z6831 Body mass index (BMI) 31.0-31.9, adult: Secondary | ICD-10-CM | POA: Diagnosis not present

## 2020-11-15 ENCOUNTER — Other Ambulatory Visit: Payer: Self-pay

## 2020-11-15 ENCOUNTER — Emergency Department (HOSPITAL_COMMUNITY)
Admission: EM | Admit: 2020-11-15 | Discharge: 2020-11-16 | Disposition: A | Discharge status: Home / Self Care | Payer: BLUE CROSS/BLUE SHIELD | Attending: Emergency Medicine | Admitting: Emergency Medicine

## 2020-11-15 ENCOUNTER — Encounter (HOSPITAL_COMMUNITY): Payer: Self-pay | Admitting: *Deleted

## 2020-11-15 DIAGNOSIS — I5032 Chronic diastolic (congestive) heart failure: Secondary | ICD-10-CM | POA: Diagnosis not present

## 2020-11-15 DIAGNOSIS — E785 Hyperlipidemia, unspecified: Secondary | ICD-10-CM | POA: Diagnosis not present

## 2020-11-15 DIAGNOSIS — Z955 Presence of coronary angioplasty implant and graft: Secondary | ICD-10-CM | POA: Insufficient documentation

## 2020-11-15 DIAGNOSIS — Z87891 Personal history of nicotine dependence: Secondary | ICD-10-CM | POA: Diagnosis not present

## 2020-11-15 DIAGNOSIS — Z7982 Long term (current) use of aspirin: Secondary | ICD-10-CM | POA: Diagnosis not present

## 2020-11-15 DIAGNOSIS — R739 Hyperglycemia, unspecified: Secondary | ICD-10-CM

## 2020-11-15 DIAGNOSIS — N200 Calculus of kidney: Secondary | ICD-10-CM

## 2020-11-15 DIAGNOSIS — I25110 Atherosclerotic heart disease of native coronary artery with unstable angina pectoris: Secondary | ICD-10-CM | POA: Insufficient documentation

## 2020-11-15 DIAGNOSIS — Z79899 Other long term (current) drug therapy: Secondary | ICD-10-CM | POA: Diagnosis not present

## 2020-11-15 DIAGNOSIS — I110 Hypertensive heart disease with heart failure: Secondary | ICD-10-CM | POA: Diagnosis not present

## 2020-11-15 DIAGNOSIS — N939 Abnormal uterine and vaginal bleeding, unspecified: Secondary | ICD-10-CM | POA: Diagnosis not present

## 2020-11-15 DIAGNOSIS — E1165 Type 2 diabetes mellitus with hyperglycemia: Secondary | ICD-10-CM | POA: Insufficient documentation

## 2020-11-15 DIAGNOSIS — N2889 Other specified disorders of kidney and ureter: Secondary | ICD-10-CM | POA: Diagnosis not present

## 2020-11-15 DIAGNOSIS — Z7984 Long term (current) use of oral hypoglycemic drugs: Secondary | ICD-10-CM | POA: Insufficient documentation

## 2020-11-15 DIAGNOSIS — E1169 Type 2 diabetes mellitus with other specified complication: Secondary | ICD-10-CM | POA: Diagnosis not present

## 2020-11-15 LAB — CBC WITH DIFFERENTIAL/PLATELET
Abs Immature Granulocytes: 0.05 10*3/uL (ref 0.00–0.07)
Basophils Absolute: 0.1 10*3/uL (ref 0.0–0.1)
Basophils Relative: 1 %
Eosinophils Absolute: 0.3 10*3/uL (ref 0.0–0.5)
Eosinophils Relative: 4 %
HCT: 40.1 % (ref 36.0–46.0)
Hemoglobin: 13.6 g/dL (ref 12.0–15.0)
Immature Granulocytes: 1 %
Lymphocytes Relative: 22 %
Lymphs Abs: 1.5 10*3/uL (ref 0.7–4.0)
MCH: 31.6 pg (ref 26.0–34.0)
MCHC: 33.9 g/dL (ref 30.0–36.0)
MCV: 93.3 fL (ref 80.0–100.0)
Monocytes Absolute: 0.7 10*3/uL (ref 0.1–1.0)
Monocytes Relative: 9 %
Neutro Abs: 4.5 10*3/uL (ref 1.7–7.7)
Neutrophils Relative %: 63 %
Platelets: 290 10*3/uL (ref 150–400)
RBC: 4.3 MIL/uL (ref 3.87–5.11)
RDW: 12.1 % (ref 11.5–15.5)
WBC: 7.1 10*3/uL (ref 4.0–10.5)
nRBC: 0 % (ref 0.0–0.2)

## 2020-11-15 LAB — URINALYSIS, ROUTINE W REFLEX MICROSCOPIC
Bacteria, UA: NONE SEEN
Bilirubin Urine: NEGATIVE
Glucose, UA: >=500 mg/dL — AB
Hgb urine dipstick: NEGATIVE
Ketones, ur: 5 mg/dL — AB
Leukocytes,Ua: NEGATIVE
Nitrite: NEGATIVE
Protein, ur: NEGATIVE mg/dL
Specific Gravity, Urine: 1.022 (ref 1.005–1.030)
pH: 6 (ref 5.0–8.0)

## 2020-11-15 LAB — BASIC METABOLIC PANEL
Anion gap: 11 (ref 5–15)
BUN: 27 mg/dL — ABNORMAL HIGH (ref 8–23)
CO2: 25 mmol/L (ref 22–32)
Calcium: 9.7 mg/dL (ref 8.9–10.3)
Chloride: 99 mmol/L (ref 98–111)
Creatinine, Ser: 0.82 mg/dL (ref 0.44–1.00)
GFR, Estimated: >60 mL/min (ref >60)
Glucose, Bld: 447 mg/dL — ABNORMAL HIGH (ref 70–99)
Potassium: 3.6 mmol/L (ref 3.5–5.1)
Sodium: 135 mmol/L (ref 135–145)

## 2020-11-15 NOTE — ED Triage Notes (Signed)
Pt noted blood on toilet paper after wiping after voiding yesterday morning, stopped last night. Lower back pain started yesterday.  Denies burning with urination.

## 2020-11-16 ENCOUNTER — Emergency Department (HOSPITAL_COMMUNITY): Payer: BLUE CROSS/BLUE SHIELD

## 2020-11-16 DIAGNOSIS — N200 Calculus of kidney: Secondary | ICD-10-CM | POA: Diagnosis not present

## 2020-11-16 DIAGNOSIS — N2889 Other specified disorders of kidney and ureter: Secondary | ICD-10-CM | POA: Diagnosis not present

## 2020-11-16 IMAGING — CT CT RENAL STONE PROTOCOL
2 of 4 series · 16 of 46 positions shown, 18 images · non-contrast
Comparison: None.

CLINICAL DATA: Lower back pain and flank pain

EXAM:
CT ABDOMEN AND PELVIS WITHOUT CONTRAST
TECHNIQUE: Multidetector CT imaging of the abdomen and pelvis was performed
following the standard protocol without IV contrast.

[Series 2: axial st · axial · 0.73mm/px · z∈[-420,-10]mm · 13 of 94 slices shown, 15 images]
[im 6/94  soft-tissue]
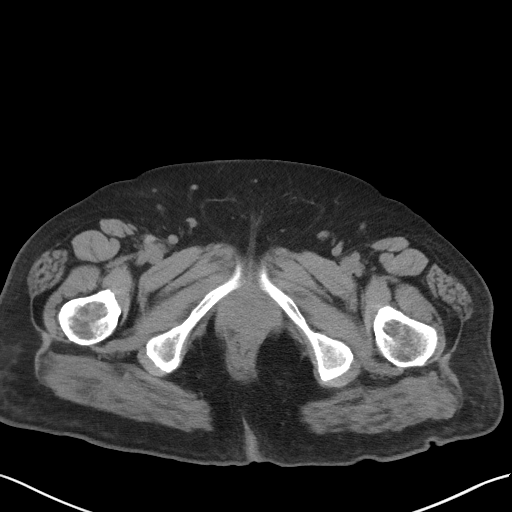
[im 6/94  bone]
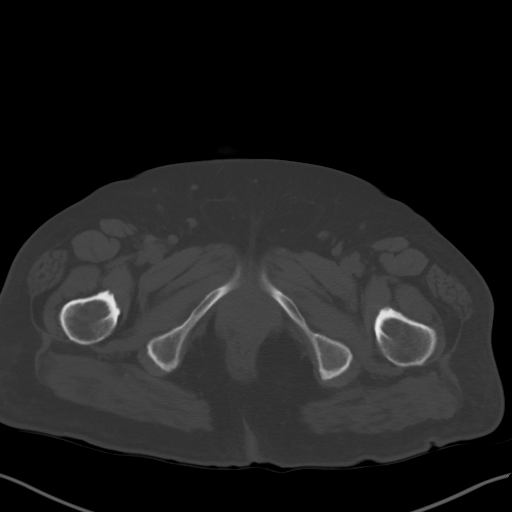
[im 11/94  soft-tissue]
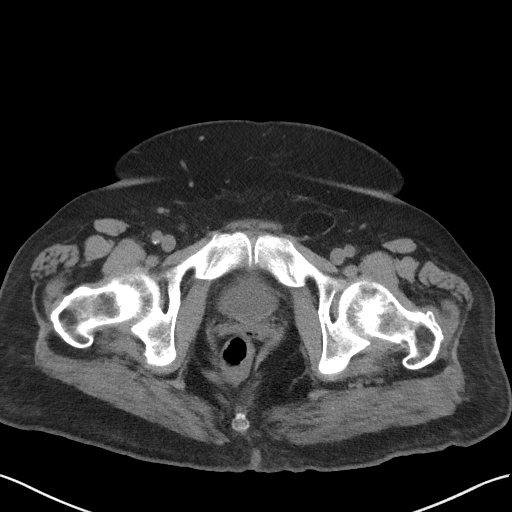
[im 21/94  soft-tissue]
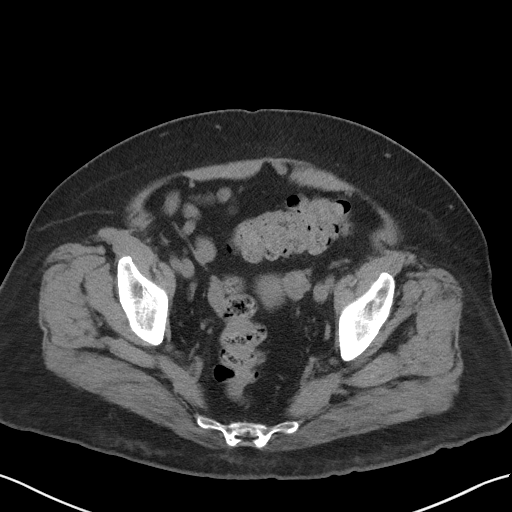
[im 26/94  soft-tissue]
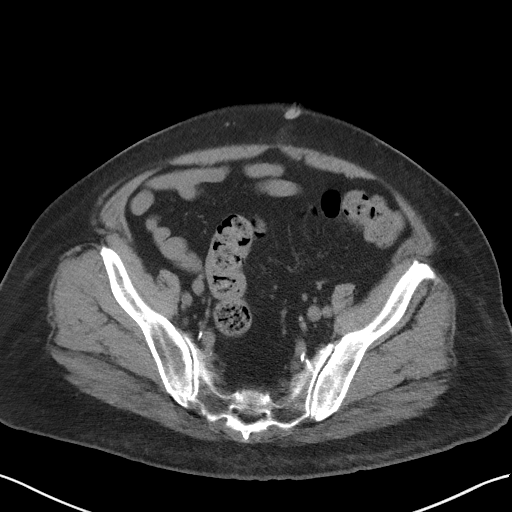
[im 32/94  soft-tissue]
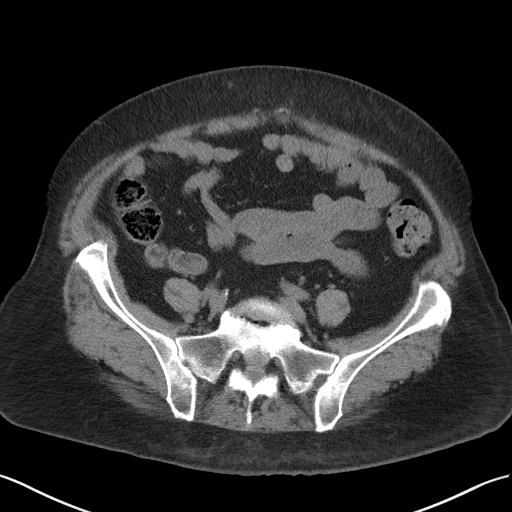
[im 42/94  soft-tissue]
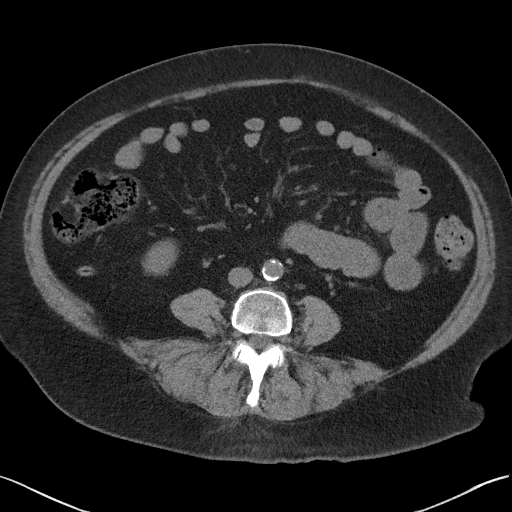
[im 47/94  soft-tissue]
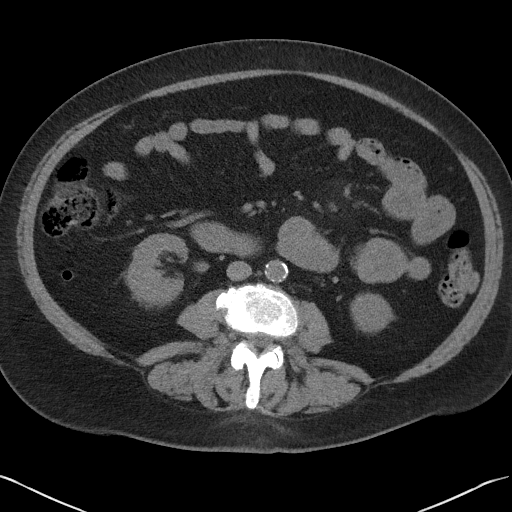
[im 52/94  soft-tissue]
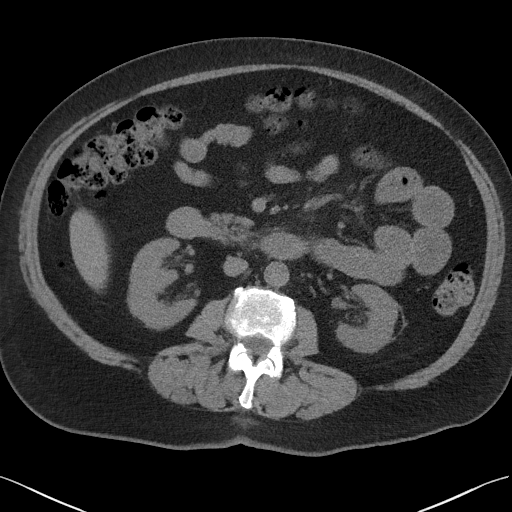
[im 63/94  soft-tissue]
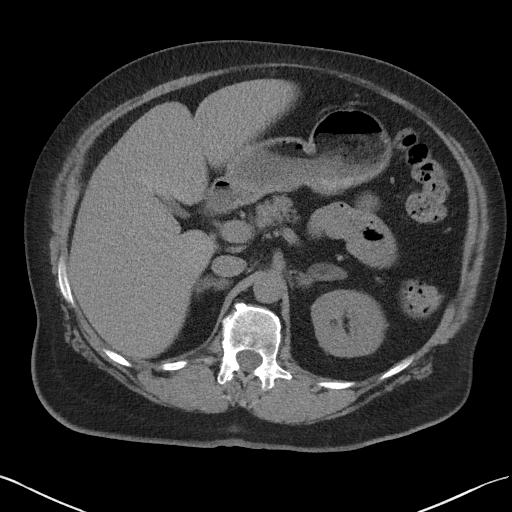
[im 63/94  bone]
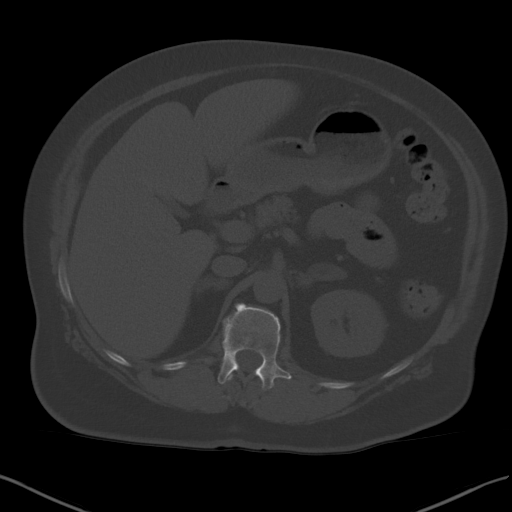
[im 68/94  soft-tissue]
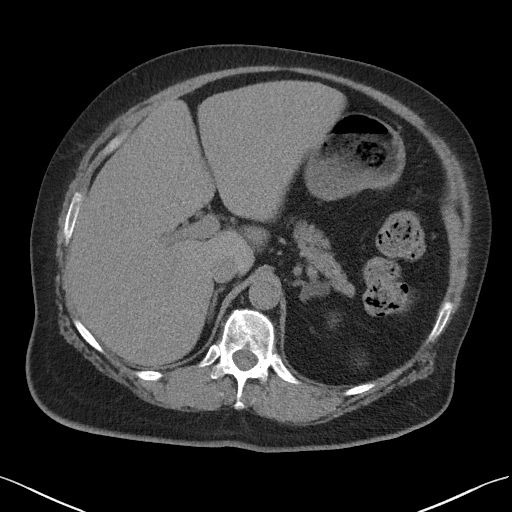
[im 73/94  soft-tissue]
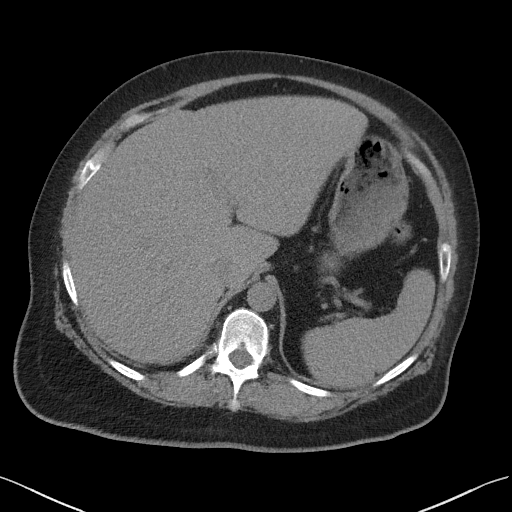
[im 83/94  soft-tissue]
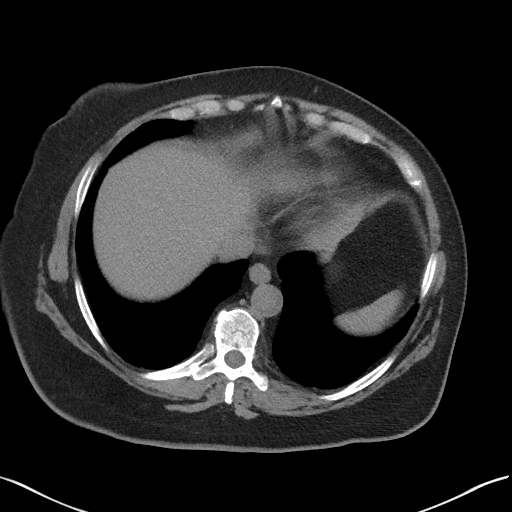
[im 88/94  soft-tissue]
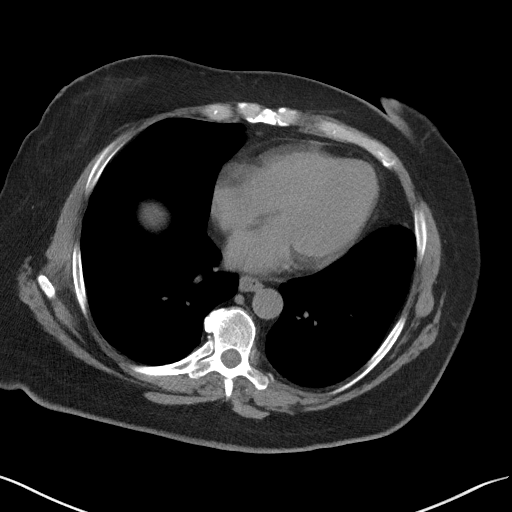

[Series 5: coronal st · coronal · 0.81mm/px · 3 of 104 slices shown]
[im 35/104  soft-tissue]
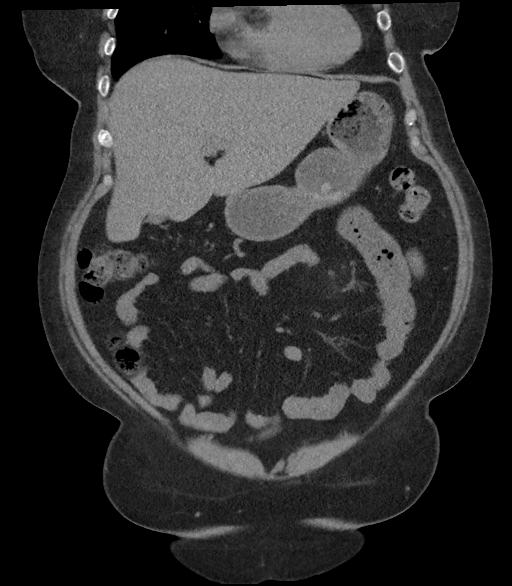
[im 46/104  soft-tissue]
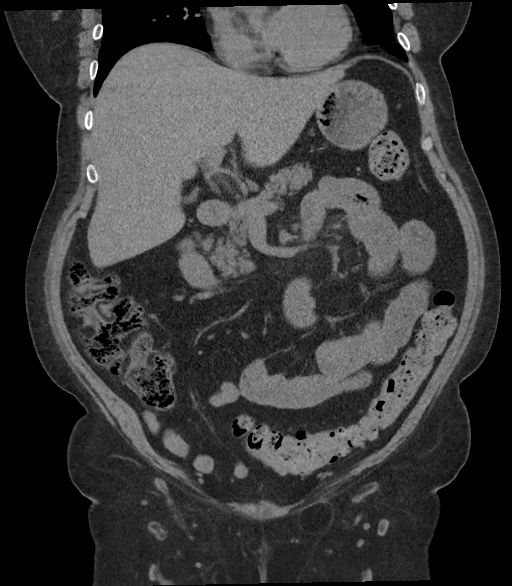
[im 58/104  soft-tissue]
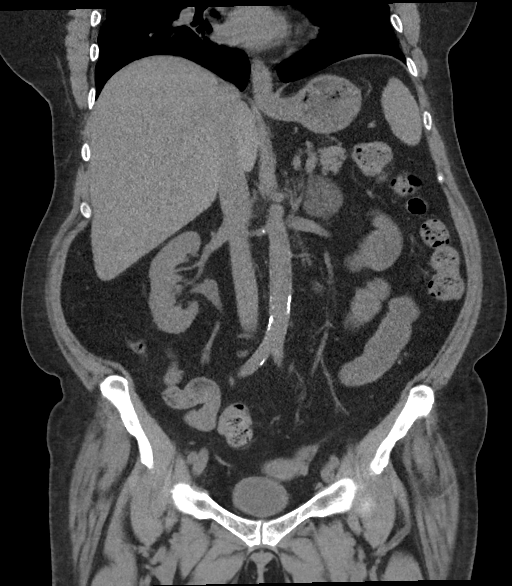

[16 of 46 positions shown; findings below may reference images not displayed]

FINDINGS: Lower chest: The visualized heart size within normal limits. No
pericardial fluid/thickening.

No hiatal hernia.

The visualized portions of the lungs are clear.

Hepatobiliary: Although limited due to the lack of intravenous
contrast, normal in appearance without gross focal abnormality. No
evidence of calcified gallstones or biliary ductal dilatation.

Pancreas:  Unremarkable.  No surrounding inflammatory changes.

Spleen: Normal in size. Although limited due to the lack of
intravenous contrast, normal in appearance.

Adrenals/Urinary Tract: Both adrenal glands appear normal. There is
a punctate calcification seen at the distal right UVJ. No
hydronephrosis however seen. No left-sided renal or collecting
system calculi are noted. The bladder is unremarkable.

Stomach/Bowel: The stomach, small bowel, and colon are normal in
appearance. No inflammatory changes or obstructive findings.
appendix is normal.

Vascular/Lymphatic: There are no enlarged abdominal or pelvic lymph
nodes. Scattered aortic atherosclerotic calcifications are seen
without aneurysmal dilatation.

Reproductive: The uterus and adnexa are unremarkable.

Other: No evidence of abdominal wall mass. A fat containing left
inguinal hernia is noted.

Musculoskeletal: No acute or significant osseous findings.
IMPRESSION: Punctate calcifications seen in the right UVJ. No hydronephrosis
however seen.

Aortic Atherosclerosis ([7L]-[7L]).

## 2020-11-16 MED ORDER — GLIPIZIDE 10 MG PO TABS
10.0000 mg | ORAL_TABLET | Freq: Every day | ORAL | Status: DC
  Administered 2020-11-16: 10 mg via ORAL
  Filled 2020-11-16: qty 1
  Filled 2020-11-16: qty 2

## 2020-11-16 MED ORDER — GLIPIZIDE 10 MG PO TABS
10.0000 mg | ORAL_TABLET | Freq: Every day | ORAL | Status: DC
  Filled 2020-11-16: qty 1

## 2020-11-16 NOTE — Discharge Instructions (Addendum)
You were seen today for blood in your urine.  You have a punctate kidney stone on the right side.  Continue to stay very hydrated.  Take Tylenol as needed for pain has this has been helping you.  Your blood sugar was elevated.  Make sure you are taking your medications as directed.

## 2020-11-16 NOTE — ED Notes (Signed)
Pt transported to CT ?

## 2020-11-16 NOTE — ED Provider Notes (Signed)
[facility] EMERGENCY DEPARTMENT Provider Note   CSN: [id number] Arrival date & time: 11/15/20  2058     History Chief Complaint  Patient presents with  . Vaginal Bleeding    Amanda Mendez is a 64 y.o. female.  HPI     This is a 64 year old female with a history of hypertension, kidney stones, diabetes who presents with hematuria.  Patient reports that yesterday she woke up from sleep.  She went to the bathroom and noted blood when she wiped.  She is throughout the day noted a scant amount of blood with wiping.  She did not note any blood in her underwear.  She is not sure whether this was vaginal or from her urinary tract.  She denies any dysuria.  Patient did state that she noted some right lower back pain.  It seems to come and go.  She treated it with Tylenol with some relief.  Currently she is not having any significant pain.  No fevers.  No abdominal pain.  No nausea or vomiting.  Past Medical History:  Diagnosis Date  . Anginal pain ([place])   . Coronary artery disease    angioplasty 9/13  . Exertional dyspnea    "usually; today it was with nothing" (11/03/2012)  . GERD (gastroesophageal reflux disease)   . Hypercholesterolemia with hypertriglyceridemia   . Hypertension   . Kidney stones    "I've had them 5 times; lithotripsy 1st time; flushed out  all the others" (11/03/2012)  . Obesity (BMI 30-39.9)   . Pneumonia 2012  . Sinus headache    "weekly" (11/03/2012)  . Type II diabetes mellitus ([place]) 2007    Patient Active Problem List   Diagnosis Date Noted  . Gastroenteritis, infectious 05/29/2014  . Abdominal pain 05/28/2014  . HLD (hyperlipidemia) 05/28/2014  . CAD (coronary artery disease) 09/03/2012  . Diabetes mellitus type II, uncontrolled ([place]) 08/14/2012  . HTN (hypertension) 08/14/2012  . Unstable angina ([place]) 08/13/2012  . Acute diastolic heart failure ([place]) 08/13/2012    Past Surgical History:  Procedure Laterality Date  . CARDIAC CATHETERIZATION   11/25/29013  . CORONARY ANGIOPLASTY WITH STENT PLACEMENT  08/2012   "1"  . HEMATOMA EVACUATION  09/22/2012   Procedure: EVACUATION HEMATOMA;  Surgeon: Wynonia Sours, MD;  Location: [place];  Service: Orthopedics;  Laterality: Right;  Evacuation Hematoma right hand  . LEFT AND RIGHT HEART CATHETERIZATION WITH CORONARY ANGIOGRAM N/A 08/14/2012   Procedure: LEFT AND RIGHT HEART CATHETERIZATION WITH CORONARY ANGIOGRAM;  Surgeon: Minus Breeding, MD;  Location: [facility] CATH LAB;  Service: Cardiovascular;  Laterality: N/A;  . LEFT HEART CATHETERIZATION WITH CORONARY ANGIOGRAM N/A 11/03/2012   Procedure: LEFT HEART CATHETERIZATION WITH CORONARY ANGIOGRAM;  Surgeon: Jolaine Artist, MD;  Location: [facility] C[facility] LAB;  Service: Cardiovascular;  Laterality: N/A;  . LITHOTRIPSY  1990's  . PERCUTANEOUS CORONARY STENT INTERVENTION (PCI-S)  08/14/2012   Procedure: PERCUTANEOUS CORONARY STENT INTERVENTION (PCI-S);  Surgeon: Minus Breeding, MD;  Location: [facility] LAB;  Service: Cardiovascular;;  . SKIN BIOPSY  2012   "off my back; it was nothing fatal; infection caused it" (11/03/2012)  . TUBAL LIGATION  1980     OB History   No obstetric history on file.     Family History  Problem Relation Age of Onset  . Heart failure Mother        Died in her 17s, had angina starting in her 66s  . Crohn's disease Brother     Social History  Tobacco Use  . Smoking status: Former Smoker    Packs/day: 1.50    Years: 37.00    Pack years: 55.50    Types: Cigarettes    Quit date: 06/11/2012    Years since quitting: 8.4  . Smokeless tobacco: Never Used  Substance Use Topics  . Alcohol use: Yes    Comment: 11/03/2012 "aien't drank none in ~ 10 yr; then it was just an occasional daquiri on vacation"  . Drug use: No    Home Medications Prior to Admission medications   Medication Sig Start Date End Date Taking? Authorizing Provider  aspirin EC 81 MG tablet Take 81 mg by mouth daily. Swallow whole.     [provider]  clopidogrel (PLAVIX) 75 MG tablet Take 75 mg by mouth daily.    [provider]  ergocalciferol (VITAMIN D2) 1.25 MG (50000 UT) capsule TAKE 1 CAPSULE TWICE A WEEK 03/12/19   [provider]  glipiZIDE (GLUCOTROL XL) 10 MG 24 hr tablet Take 10 mg by mouth 2 (two) times daily at 8 am and 10 pm.    [provider]  losartan-hydrochlorothiazide (HYZAAR) 100-25 MG per tablet Take 1 tablet by mouth every evening.    [provider]  metoprolol (LOPRESSOR) 50 MG tablet Take 50 mg by mouth 2 (two) times daily.    [provider]  omeprazole (PRILOSEC) 20 MG capsule Take 20 mg by mouth daily.    [provider]    Allergies    Dapagliflozin and Codeine  Review of Systems   Review of Systems  Constitutional: Negative for fever.  Respiratory: Negative for shortness of breath.   Cardiovascular: Negative for chest pain.  Gastrointestinal: Negative for abdominal pain, nausea and vomiting.  Genitourinary: Positive for hematuria.  Musculoskeletal: Positive for back pain.  All other systems reviewed and are negative.   Physical Exam Updated Vital Signs BP (!) 144/75 (BP Location: Right Arm)   Pulse 95   Temp (!) 97.5 F (36.4 C) (Oral)   Resp 18   Ht 1.575 m (5\' 2" )   Wt 79.4 kg   SpO2 96%   BMI 32.01 kg/m   Physical Exam Vitals and nursing note reviewed.  Constitutional:      Appearance: She is well-developed. She is obese. She is not ill-appearing.  HENT:     Head: Normocephalic and atraumatic.     Nose: Nose normal.     Mouth/Throat:     Mouth: Mucous membranes are moist.  Eyes:     Pupils: Pupils are equal, round, and reactive to light.  Cardiovascular:     Rate and Rhythm: Normal rate and regular rhythm.     Heart sounds: Normal heart sounds.  Pulmonary:     Effort: Pulmonary effort is normal. No respiratory distress.     Breath sounds: No wheezing.  Abdominal:     Palpations: Abdomen is soft.      Tenderness: There is no right CVA tenderness or left CVA tenderness.  Musculoskeletal:     Cervical back: Neck supple.     Right lower leg: No edema.     Left lower leg: No edema.  Skin:    General: Skin is warm and dry.  Neurological:     Mental Status: She is alert and oriented to person, place, and time.  Psychiatric:        Mood and Affect: Mood normal.     ED Results / Procedures / Treatments   Labs (all labs  ordered are listed, but only abnormal results are displayed) Labs Reviewed  URINALYSIS, ROUTINE W REFLEX MICROSCOPIC - Abnormal; Notable for the following components:      Result Value   Color, Urine STRAW (*)    Glucose, UA >=500 (*)    Ketones, ur 5 (*)    All other components within normal limits  BASIC METABOLIC PANEL - Abnormal; Notable for the following components:   Glucose, Bld 447 (*)    BUN 27 (*)    All other components within normal limits  CBC WITH DIFFERENTIAL/PLATELET    EKG None  Radiology CT Renal Stone Study  Result Date: 11/16/2020 CLINICAL DATA:  Lower back pain and flank pain EXAM: CT ABDOMEN AND PELVIS WITHOUT CONTRAST TECHNIQUE: Multidetector CT imaging of the abdomen and pelvis was performed following the standard protocol without IV contrast. COMPARISON:  None. FINDINGS: Lower chest: The visualized heart size within normal limits. No pericardial fluid/thickening. No hiatal hernia. The visualized portions of the lungs are clear. Hepatobiliary: Although limited due to the lack of intravenous contrast, normal in appearance without gross focal abnormality. No evidence of calcified gallstones or biliary ductal dilatation. Pancreas:  Unremarkable.  No surrounding inflammatory changes. Spleen: Normal in size. Although limited due to the lack of intravenous contrast, normal in appearance. Adrenals/Urinary Tract: Both adrenal glands appear normal. There is a punctate calcification seen at the distal right UVJ. No hydronephrosis however seen. No  left-sided renal or collecting system calculi are noted. The bladder is unremarkable. Stomach/Bowel: The stomach, small bowel, and colon are normal in appearance. No inflammatory changes or obstructive findings. appendix is normal. Vascular/Lymphatic: There are no enlarged abdominal or pelvic lymph nodes. Scattered aortic atherosclerotic calcifications are seen without aneurysmal dilatation. Reproductive: The uterus and adnexa are unremarkable. Other: No evidence of abdominal wall mass. A fat containing left inguinal hernia is noted. Musculoskeletal: No acute or significant osseous findings. IMPRESSION: Punctate calcifications seen in the right UVJ. No hydronephrosis however seen. Aortic Atherosclerosis (ICD10-I70.0). Electronically Signed   By: Prudencio Pair M.D.   On: 11/16/2020 00:29    Procedures Procedures (including critical care time)  Medications Ordered in ED Medications  glipiZIDE (GLUCOTROL) tablet 10 mg (has no administration in time range)    ED Course  I have reviewed the triage vital signs and the nursing notes.  Pertinent labs & imaging results that were available during my care of the patient were reviewed by me and considered in my medical decision making (see chart for details).    MDM Rules/Calculators/A&P                          Patient presents with hematuria versus vaginal bleeding.  She is overall nontoxic and vital signs are largely reassuring.  She has no CVA tenderness.  She only noted bleeding with wiping after urination.  Given her history, highly suspect that this is hematuria.  Given flank pain, question pyelonephritis versus kidney stone.  Urinalysis shows no obvious UTI does not have any significant red cells.  She does have a history of kidney stones.  Will obtain CT to evaluate for kidney stone.  Of note, her lab work is notable for hyperglycemia.  No evidence of DKA.  She reports that she did not take her medicines tonight.  She was dosed her home glipizide.   CT does show a punctate calcifications on the right UVJ.  This is likely the cause of the patient's symptoms.  She has  been comfortable and not required any pain medication.  I did offer her fluids which she declined and she is tolerating fluids by mouth.  Recommend aggressive hydration at home and Tylenol as needed for pain.  .After history, exam, and medical workup I feel the patient has been appropriately medically screened and is safe for discharge home. Pertinent diagnoses were discussed with the patient. Patient was given return precautions.  Final Clinical Impression(s) / ED Diagnoses Final diagnoses:  Kidney stone  Hyperglycemia    Rx / DC Orders ED Discharge Orders    None       Barry Culverhouse, Barbette Hair, MD 11/16/20 [phone]

## 2020-11-29 DIAGNOSIS — Z6832 Body mass index (BMI) 32.0-32.9, adult: Secondary | ICD-10-CM | POA: Diagnosis not present

## 2020-11-29 DIAGNOSIS — Z1389 Encounter for screening for other disorder: Secondary | ICD-10-CM | POA: Diagnosis not present

## 2020-11-29 DIAGNOSIS — E118 Type 2 diabetes mellitus with unspecified complications: Secondary | ICD-10-CM | POA: Diagnosis not present

## 2020-11-29 DIAGNOSIS — E669 Obesity, unspecified: Secondary | ICD-10-CM | POA: Diagnosis not present

## 2020-11-29 DIAGNOSIS — Z0000 Encounter for general adult medical examination without abnormal findings: Secondary | ICD-10-CM | POA: Diagnosis not present

## 2020-11-29 DIAGNOSIS — I10 Essential (primary) hypertension: Secondary | ICD-10-CM | POA: Diagnosis not present

## 2020-12-12 DIAGNOSIS — E119 Type 2 diabetes mellitus without complications: Secondary | ICD-10-CM | POA: Diagnosis not present

## 2020-12-12 DIAGNOSIS — Z7982 Long term (current) use of aspirin: Secondary | ICD-10-CM | POA: Diagnosis not present

## 2020-12-12 DIAGNOSIS — J019 Acute sinusitis, unspecified: Secondary | ICD-10-CM | POA: Diagnosis not present

## 2020-12-12 DIAGNOSIS — J029 Acute pharyngitis, unspecified: Secondary | ICD-10-CM | POA: Diagnosis not present

## 2020-12-12 DIAGNOSIS — I10 Essential (primary) hypertension: Secondary | ICD-10-CM | POA: Diagnosis not present

## 2020-12-12 DIAGNOSIS — J069 Acute upper respiratory infection, unspecified: Secondary | ICD-10-CM | POA: Diagnosis not present

## 2020-12-12 DIAGNOSIS — B9689 Other specified bacterial agents as the cause of diseases classified elsewhere: Secondary | ICD-10-CM | POA: Diagnosis not present

## 2020-12-12 DIAGNOSIS — Z885 Allergy status to narcotic agent status: Secondary | ICD-10-CM | POA: Diagnosis not present

## 2020-12-12 DIAGNOSIS — Z888 Allergy status to other drugs, medicaments and biological substances status: Secondary | ICD-10-CM | POA: Diagnosis not present

## 2020-12-12 DIAGNOSIS — Z20822 Contact with and (suspected) exposure to covid-19: Secondary | ICD-10-CM | POA: Diagnosis not present

## 2020-12-12 DIAGNOSIS — Z79899 Other long term (current) drug therapy: Secondary | ICD-10-CM | POA: Diagnosis not present

## 2020-12-12 DIAGNOSIS — Z7984 Long term (current) use of oral hypoglycemic drugs: Secondary | ICD-10-CM | POA: Diagnosis not present

## 2020-12-12 DIAGNOSIS — R059 Cough, unspecified: Secondary | ICD-10-CM | POA: Diagnosis not present

## 2021-01-03 DIAGNOSIS — Z1212 Encounter for screening for malignant neoplasm of rectum: Secondary | ICD-10-CM | POA: Diagnosis not present

## 2021-01-03 DIAGNOSIS — Z1211 Encounter for screening for malignant neoplasm of colon: Secondary | ICD-10-CM | POA: Diagnosis not present

## 2021-01-07 DIAGNOSIS — Z043 Encounter for examination and observation following other accident: Secondary | ICD-10-CM | POA: Diagnosis not present

## 2021-01-07 DIAGNOSIS — S0003XA Contusion of scalp, initial encounter: Secondary | ICD-10-CM | POA: Diagnosis not present

## 2021-01-07 DIAGNOSIS — M542 Cervicalgia: Secondary | ICD-10-CM | POA: Diagnosis not present

## 2021-01-07 DIAGNOSIS — I10 Essential (primary) hypertension: Secondary | ICD-10-CM | POA: Diagnosis not present

## 2021-01-07 DIAGNOSIS — W182XXA Fall in (into) shower or empty bathtub, initial encounter: Secondary | ICD-10-CM | POA: Diagnosis not present

## 2021-01-07 DIAGNOSIS — W19XXXA Unspecified fall, initial encounter: Secondary | ICD-10-CM | POA: Diagnosis not present

## 2021-01-07 DIAGNOSIS — R519 Headache, unspecified: Secondary | ICD-10-CM | POA: Diagnosis not present

## 2021-01-07 DIAGNOSIS — R079 Chest pain, unspecified: Secondary | ICD-10-CM | POA: Diagnosis not present

## 2021-01-07 DIAGNOSIS — E119 Type 2 diabetes mellitus without complications: Secondary | ICD-10-CM | POA: Diagnosis not present

## 2021-01-07 DIAGNOSIS — Z20822 Contact with and (suspected) exposure to covid-19: Secondary | ICD-10-CM | POA: Diagnosis not present

## 2021-01-09 DIAGNOSIS — Z20822 Contact with and (suspected) exposure to covid-19: Secondary | ICD-10-CM | POA: Diagnosis not present

## 2021-01-09 DIAGNOSIS — U071 COVID-19: Secondary | ICD-10-CM | POA: Diagnosis not present

## 2021-01-09 DIAGNOSIS — R059 Cough, unspecified: Secondary | ICD-10-CM | POA: Diagnosis not present

## 2021-01-15 DIAGNOSIS — U071 COVID-19: Secondary | ICD-10-CM | POA: Diagnosis not present

## 2021-01-15 DIAGNOSIS — Z20822 Contact with and (suspected) exposure to covid-19: Secondary | ICD-10-CM | POA: Diagnosis not present

## 2021-05-01 DIAGNOSIS — Z6832 Body mass index (BMI) 32.0-32.9, adult: Secondary | ICD-10-CM | POA: Diagnosis not present

## 2021-05-01 DIAGNOSIS — I10 Essential (primary) hypertension: Secondary | ICD-10-CM | POA: Diagnosis not present

## 2021-05-26 DIAGNOSIS — I10 Essential (primary) hypertension: Secondary | ICD-10-CM | POA: Diagnosis not present

## 2021-05-26 DIAGNOSIS — E119 Type 2 diabetes mellitus without complications: Secondary | ICD-10-CM | POA: Diagnosis not present

## 2021-05-27 ENCOUNTER — Other Ambulatory Visit: Payer: Self-pay

## 2021-05-27 ENCOUNTER — Encounter (HOSPITAL_COMMUNITY): Payer: Self-pay | Admitting: Emergency Medicine

## 2021-05-27 ENCOUNTER — Emergency Department (HOSPITAL_COMMUNITY): Payer: BC Managed Care – PPO

## 2021-05-27 ENCOUNTER — Emergency Department (HOSPITAL_COMMUNITY)
Admission: EM | Admit: 2021-05-27 | Discharge: 2021-05-27 | Disposition: A | Discharge status: Home / Self Care | Payer: BC Managed Care – PPO | Attending: Emergency Medicine | Admitting: Emergency Medicine

## 2021-05-27 DIAGNOSIS — Z79899 Other long term (current) drug therapy: Secondary | ICD-10-CM | POA: Insufficient documentation

## 2021-05-27 DIAGNOSIS — N3000 Acute cystitis without hematuria: Secondary | ICD-10-CM

## 2021-05-27 DIAGNOSIS — E785 Hyperlipidemia, unspecified: Secondary | ICD-10-CM | POA: Insufficient documentation

## 2021-05-27 DIAGNOSIS — Z7984 Long term (current) use of oral hypoglycemic drugs: Secondary | ICD-10-CM | POA: Insufficient documentation

## 2021-05-27 DIAGNOSIS — I110 Hypertensive heart disease with heart failure: Secondary | ICD-10-CM | POA: Insufficient documentation

## 2021-05-27 DIAGNOSIS — Z7902 Long term (current) use of antithrombotics/antiplatelets: Secondary | ICD-10-CM | POA: Insufficient documentation

## 2021-05-27 DIAGNOSIS — N289 Disorder of kidney and ureter, unspecified: Secondary | ICD-10-CM | POA: Diagnosis not present

## 2021-05-27 DIAGNOSIS — E1169 Type 2 diabetes mellitus with other specified complication: Secondary | ICD-10-CM | POA: Insufficient documentation

## 2021-05-27 DIAGNOSIS — Z87891 Personal history of nicotine dependence: Secondary | ICD-10-CM | POA: Diagnosis not present

## 2021-05-27 DIAGNOSIS — I5031 Acute diastolic (congestive) heart failure: Secondary | ICD-10-CM | POA: Insufficient documentation

## 2021-05-27 DIAGNOSIS — I10 Essential (primary) hypertension: Secondary | ICD-10-CM | POA: Diagnosis not present

## 2021-05-27 DIAGNOSIS — R519 Headache, unspecified: Secondary | ICD-10-CM | POA: Diagnosis not present

## 2021-05-27 DIAGNOSIS — I25110 Atherosclerotic heart disease of native coronary artery with unstable angina pectoris: Secondary | ICD-10-CM | POA: Diagnosis not present

## 2021-05-27 DIAGNOSIS — Z955 Presence of coronary angioplasty implant and graft: Secondary | ICD-10-CM | POA: Diagnosis not present

## 2021-05-27 DIAGNOSIS — Z7982 Long term (current) use of aspirin: Secondary | ICD-10-CM | POA: Insufficient documentation

## 2021-05-27 DIAGNOSIS — R079 Chest pain, unspecified: Secondary | ICD-10-CM | POA: Diagnosis not present

## 2021-05-27 DIAGNOSIS — R030 Elevated blood-pressure reading, without diagnosis of hypertension: Secondary | ICD-10-CM | POA: Diagnosis not present

## 2021-05-27 LAB — URINALYSIS, ROUTINE W REFLEX MICROSCOPIC
Bilirubin Urine: NEGATIVE
Glucose, UA: >=500 mg/dL — AB
Hgb urine dipstick: NEGATIVE
Ketones, ur: NEGATIVE mg/dL
Nitrite: POSITIVE — AB
Protein, ur: NEGATIVE mg/dL
Specific Gravity, Urine: 1.018 (ref 1.005–1.030)
WBC, UA: >50 WBC/hpf — ABNORMAL HIGH (ref 0–5)
pH: 6 (ref 5.0–8.0)

## 2021-05-27 LAB — CBC
HCT: 42.2 % (ref 36.0–46.0)
Hemoglobin: 14.1 g/dL (ref 12.0–15.0)
MCH: 30.9 pg (ref 26.0–34.0)
MCHC: 33.4 g/dL (ref 30.0–36.0)
MCV: 92.3 fL (ref 80.0–100.0)
Platelets: 290 10*3/uL (ref 150–400)
RBC: 4.57 MIL/uL (ref 3.87–5.11)
RDW: 12.4 % (ref 11.5–15.5)
WBC: 8.3 10*3/uL (ref 4.0–10.5)
nRBC: 0 % (ref 0.0–0.2)

## 2021-05-27 LAB — BASIC METABOLIC PANEL
Anion gap: 11 (ref 5–15)
BUN: 31 mg/dL — ABNORMAL HIGH (ref 8–23)
CO2: 28 mmol/L (ref 22–32)
Calcium: 9.3 mg/dL (ref 8.9–10.3)
Chloride: 96 mmol/L — ABNORMAL LOW (ref 98–111)
Creatinine, Ser: 1.36 mg/dL — ABNORMAL HIGH (ref 0.44–1.00)
GFR, Estimated: 43 mL/min — ABNORMAL LOW (ref >60)
Glucose, Bld: 290 mg/dL — ABNORMAL HIGH (ref 70–99)
Potassium: 3.8 mmol/L (ref 3.5–5.1)
Sodium: 135 mmol/L (ref 135–145)

## 2021-05-27 LAB — TROPONIN I (HIGH SENSITIVITY): Troponin I (High Sensitivity): 3 ng/L (ref <18)

## 2021-05-27 IMAGING — DX DG CHEST 2V
2 series · 2 of 2 positions shown · non-contrast
Comparison: [DATE]

CLINICAL DATA: Chest pain for several weeks

EXAM:
CHEST - 2 VIEW

[chest pa]
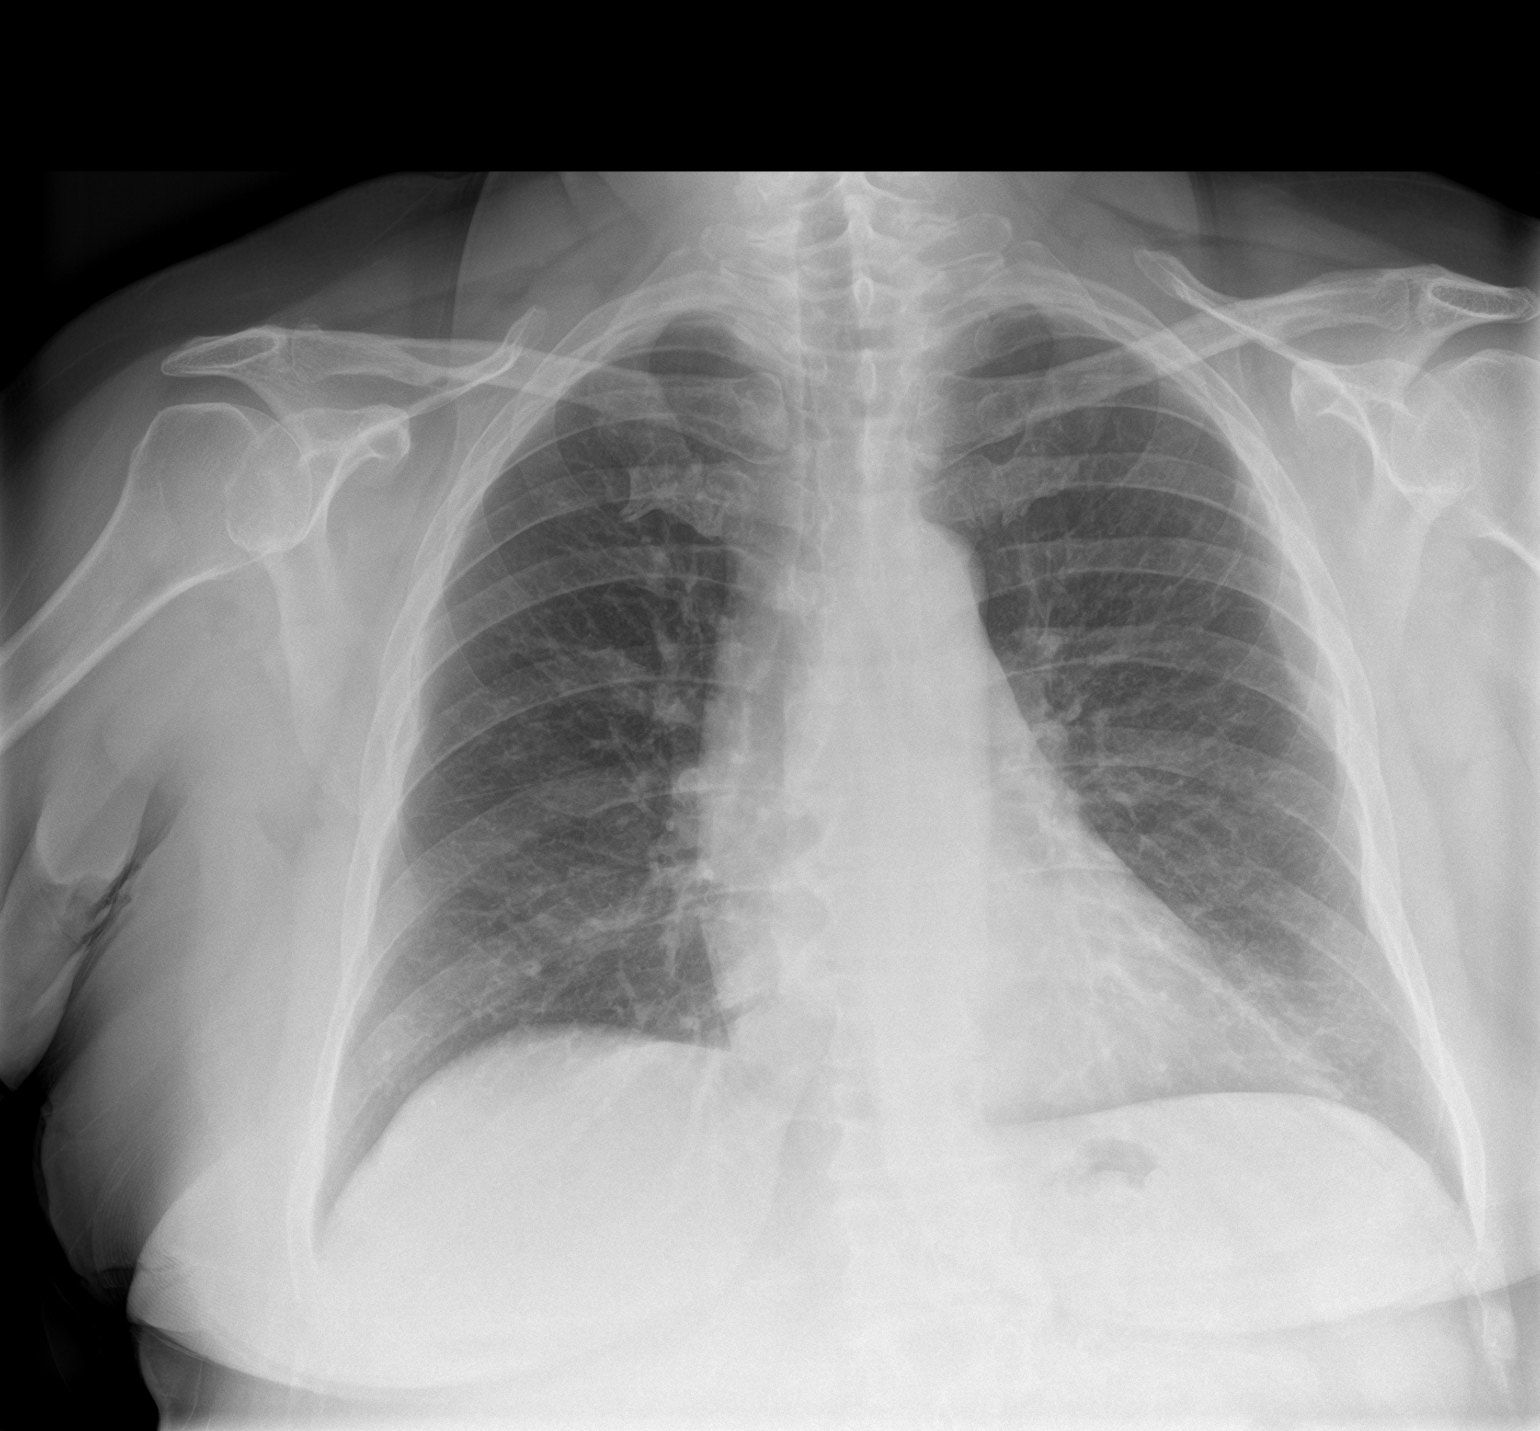

[chest lat]
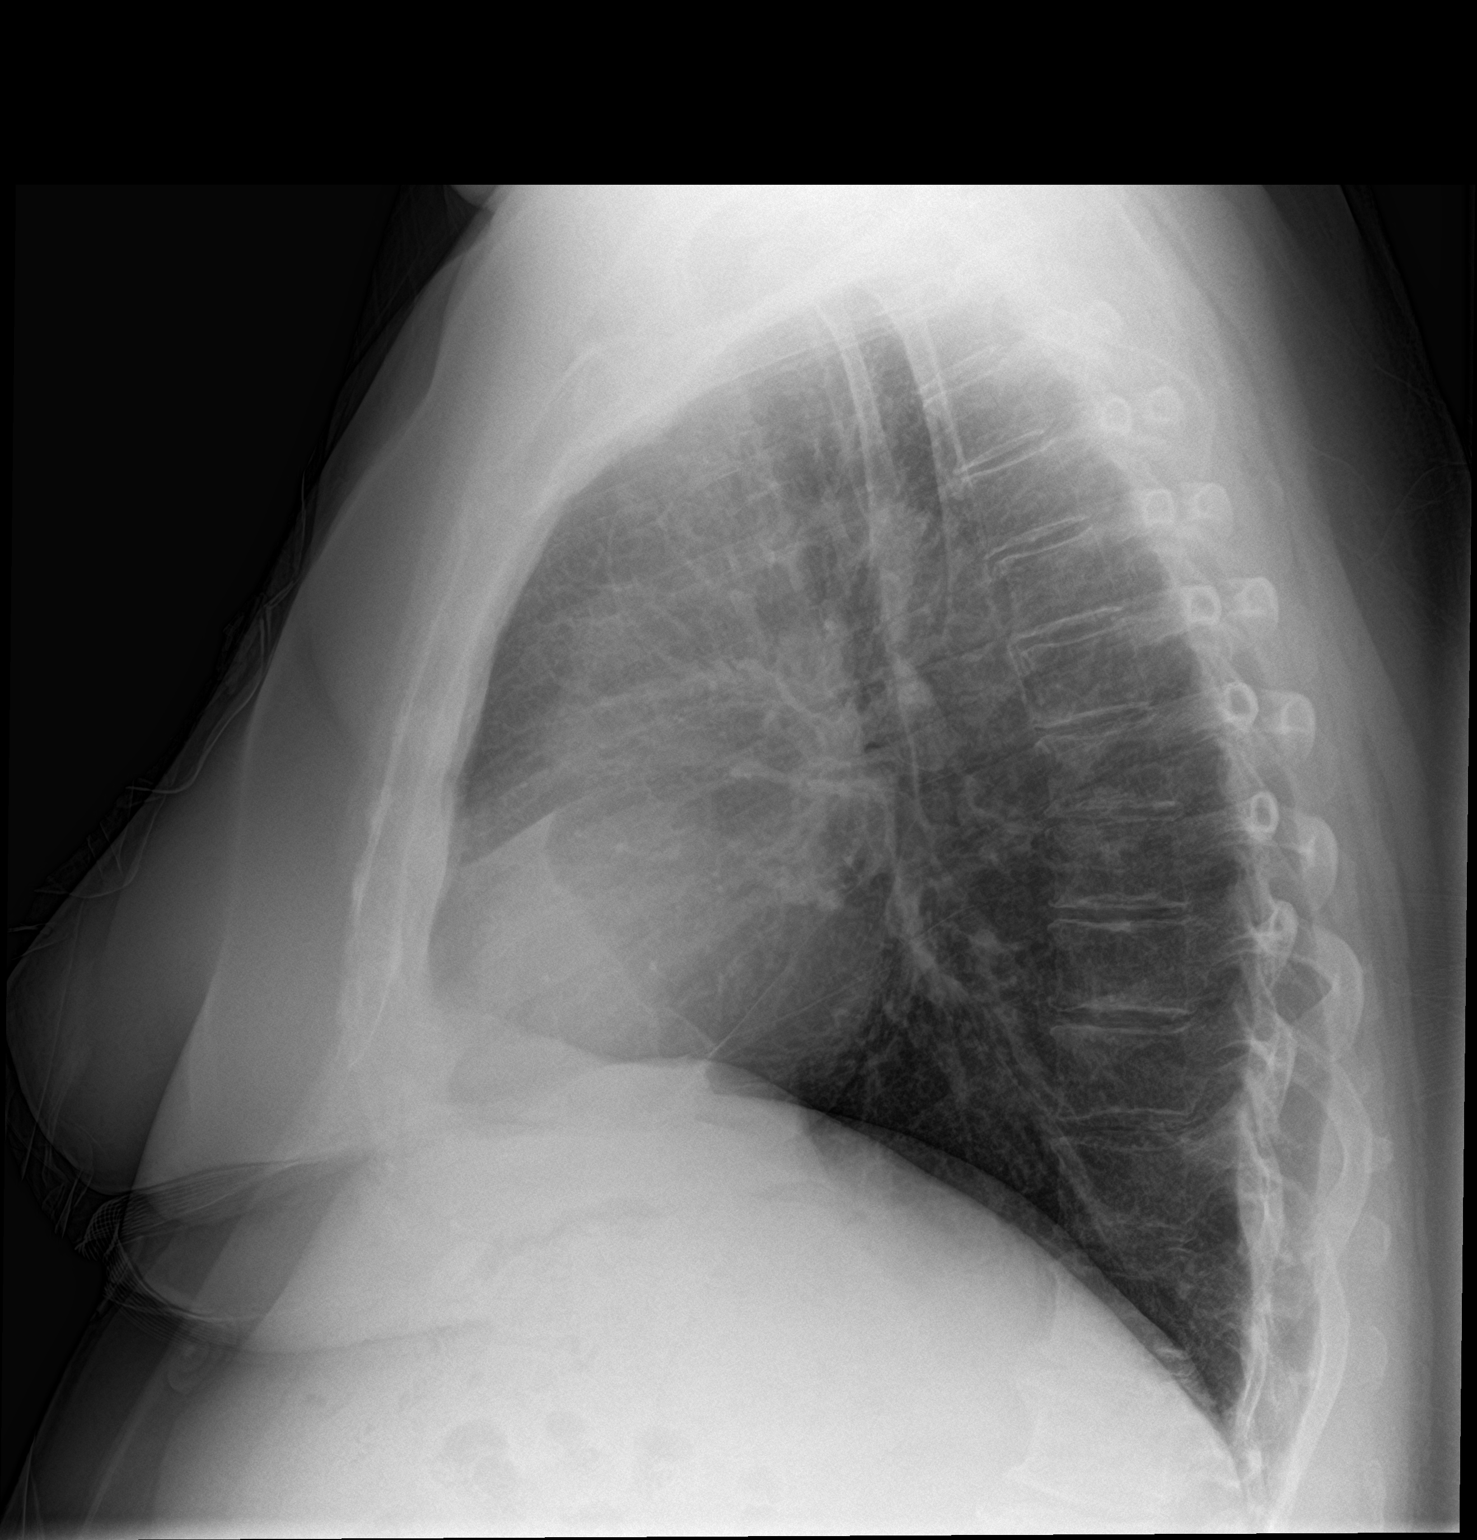

[2 of 2 positions shown; findings below may reference images not displayed]

FINDINGS: The heart size and mediastinal contours are within normal limits.
Both lungs are clear. The visualized skeletal structures are
unremarkable.
IMPRESSION: No active cardiopulmonary disease.

## 2021-05-27 MED ORDER — CEPHALEXIN 500 MG PO CAPS
500.0000 mg | ORAL_CAPSULE | Freq: Once | ORAL | Status: AC
  Administered 2021-05-27: 500 mg via ORAL
  Filled 2021-05-27: qty 1

## 2021-05-27 MED ORDER — CEPHALEXIN 500 MG PO CAPS: 500.0000 mg | ORAL_CAPSULE | Freq: Four times a day (QID) | ORAL | 0 refills | Status: DC

## 2021-05-27 NOTE — ED Provider Notes (Signed)
[facility] EMERGENCY DEPARTMENT Provider Note   CSN: [id number] Arrival date & time: 05/27/21  1953     History Chief Complaint  Patient presents with   Hypertension    Amanda Mendez is a 65 y.o. female.  Patient presenting with concern for high blood pressure.  She has been trying to get her dentist to work on her teeth.  Needs some teeth pulled.  But every time she goes and blood pressure is high.  He sends her to the emergency department.  Patient seen at an urgent care on May 23 for hypertension and also seen in the [facility] emergency department yesterday.  Cording to their notes patient was treated with some hydralazine blood pressure came down and then she was discharged.  But her blood pressure back up again today.  Initially out from it was 176/81.  When I was seeing the patient it was down into 153/84.  Patient is on blood pressure medicine and she is taking that.  She is on losartan hydrochlorothiazide and Lopressor.  She also was started on clonidine.  Has follow-up with her primary care doctor as a walk-in patient on Monday morning.  This is already arranged.  Patient did have some chest discomfort that is been going on for a couple weeks.  No shortness of breath does have a mild headache no severe headache.  Headache comes and goes.  No difficulty breathing.  And patient has had some dysuria or some difficulty voiding.  Patient without any stroke symptoms.  No visual changes no speech problems no numbness no weakness past medical history significant for coronary artery disease diabetes hypertension history of acute diastolic heart failure in the past.      Past Medical History:  Diagnosis Date   Anginal pain ([place])    Coronary artery disease    angioplasty 9/13   Exertional dyspnea    "usually; today it was with nothing" (11/03/2012)   GERD (gastroesophageal reflux disease)    Hypercholesterolemia with hypertriglyceridemia     Hypertension    Kidney stones    "I've had them 5 times; lithotripsy 1st time; flushed out  all the others" (11/03/2012)   Obesity (BMI 30-39.9)    Pneumonia 2012   Sinus headache    "weekly" (11/03/2012)   Type II diabetes mellitus ([place]) 2007    Patient Active Problem List   Diagnosis Date Noted   Gastroenteritis, infectious 05/29/2014   Abdominal pain 05/28/2014   HLD (hyperlipidemia) 05/28/2014   CAD (coronary artery disease) 09/03/2012   Diabetes mellitus type II, uncontrolled ([place]) 08/14/2012   HTN (hypertension) 08/14/2012   Unstable angina ([place]) 82/50/0370   Acute diastolic heart failure ([place]) 08/13/2012    Past Surgical History:  Procedure Laterality Date   CARDIAC CATHETERIZATION  11/25/29013   CORONARY ANGIOPLASTY WITH STENT PLACEMENT  08/2012   "1"   HEMATOMA EVACUATION  09/22/2012   Procedure: EVACUATION HEMATOMA;  Surgeon: Wynonia Sours, MD;  Location: [place];  Service: Orthopedics;  Laterality: Right;  Evacuation Hematoma right hand   LEFT AND RIGHT HEART CATHETERIZATION WITH CORONARY ANGIOGRAM N/A 08/14/2012   Procedure: LEFT AND RIGHT HEART CATHETERIZATION WITH CORONARY ANGIOGRAM;  Surgeon: Minus Breeding, MD;  Location: [facility] CATH LAB;  Service: Cardiovascular;  Laterality: N/A;   LEFT HEART CATHETERIZATION WITH CORONARY ANGIOGRAM N/A 11/03/2012   Procedure: LEFT HEART CATHETERIZATION WITH CORONARY ANGIOGRAM;  Surgeon: Jolaine Artist, MD;  Location: [facility] CATH LAB;  Service: Cardiovascular;  Laterality:  N/A;   LITHOTRIPSY  1990's   PERCUTANEOUS CORONARY STENT INTERVENTION (PCI-S)  08/14/2012   Procedure: PERCUTANEOUS CORONARY STENT INTERVENTION (PCI-S);  Surgeon: Minus Breeding, MD;  Location: [facility] CATH LAB;  Service: Cardiovascular;;   SKIN BIOPSY  2012   "off my back; it was nothing fatal; infection caused it" (11/03/2012)   TUBAL LIGATION  1980     OB History   No obstetric history on file.     Family History  Problem Relation Age of Onset    Heart failure Mother        Died in her 37s, had angina starting in her 85s   Crohn's disease Brother     Social History   Tobacco Use   Smoking status: Former    Packs/day: 1.50    Years: 37.00    Pack years: 55.50    Types: Cigarettes    Quit date: 06/11/2012    Years since quitting: 8.9   Smokeless tobacco: Never  Substance Use Topics   Alcohol use: Yes    Comment: 11/03/2012 "aien't drank none in ~ 10 yr; then it was just an occasional daquiri on vacation"   Drug use: No    Home Medications Prior to Admission medications   Medication Sig Start Date End Date Taking? Authorizing Provider  aspirin EC 81 MG tablet Take 81 mg by mouth daily. Swallow whole.    [provider]  clopidogrel (PLAVIX) 75 MG tablet Take 75 mg by mouth daily.    [provider]  ergocalciferol (VITAMIN D2) 1.25 MG (50000 UT) capsule TAKE 1 CAPSULE TWICE A WEEK 03/12/19   [provider]  glipiZIDE (GLUCOTROL XL) 10 MG 24 hr tablet Take 10 mg by mouth 2 (two) times daily at 8 am and 10 pm.    [provider]  losartan-hydrochlorothiazide (HYZAAR) 100-25 MG per tablet Take 1 tablet by mouth every evening.    [provider]  metoprolol (LOPRESSOR) 50 MG tablet Take 50 mg by mouth 2 (two) times daily.    [provider]  omeprazole (PRILOSEC) 20 MG capsule Take 20 mg by mouth daily.    [provider]    Allergies    Dapagliflozin and Codeine  Review of Systems   Review of Systems  Constitutional:  Negative for chills and fever.  HENT:  Negative for ear pain, rhinorrhea and sore throat.   Eyes:  Negative for pain and visual disturbance.  Respiratory:  Negative for cough and shortness of breath.   Cardiovascular:  Positive for chest pain. Negative for palpitations and leg swelling.  Gastrointestinal:  Negative for abdominal pain, diarrhea, nausea and vomiting.  Genitourinary:  Positive for difficulty urinating and dysuria. Negative for  hematuria.  Musculoskeletal:  Negative for arthralgias, back pain and neck pain.  Skin:  Negative for color change and rash.  Neurological:  Positive for headaches. Negative for dizziness, seizures, syncope, weakness and light-headedness.  Hematological:  Does not bruise/bleed easily.  Psychiatric/Behavioral:  Negative for confusion.   All other systems reviewed and are negative.  Physical Exam Updated Vital Signs BP (!) 176/81   Pulse 76   Temp 98.4 F (36.9 C)   Resp 18   Ht 1.575 m (5\' 2" )   Wt 79.4 kg   SpO2 99%   BMI 32.02 kg/m   Physical Exam Vitals and nursing note reviewed.  Constitutional:      General: She is not in acute distress.    Appearance: Normal appearance. She is well-developed.  She is not ill-appearing or toxic-appearing.  HENT:     Head: Normocephalic and atraumatic.  Eyes:     Extraocular Movements: Extraocular movements intact.     Conjunctiva/sclera: Conjunctivae normal.     Pupils: Pupils are equal, round, and reactive to light.  Cardiovascular:     Rate and Rhythm: Normal rate and regular rhythm.     Heart sounds: No murmur heard. Pulmonary:     Effort: Pulmonary effort is normal. No respiratory distress.     Breath sounds: Normal breath sounds. No wheezing, rhonchi or rales.  Chest:     Chest wall: No tenderness.  Abdominal:     Palpations: Abdomen is soft.     Tenderness: There is no abdominal tenderness.  Musculoskeletal:        General: No swelling.     Cervical back: Neck supple.  Skin:    General: Skin is warm and dry.  Neurological:     General: No focal deficit present.     Mental Status: She is alert and oriented to person, place, and time.     Cranial Nerves: No cranial nerve deficit.     Sensory: No sensory deficit.     Motor: No weakness.     Coordination: Coordination normal.    ED Results / Procedures / Treatments   Labs (all labs ordered are listed, but only abnormal results are displayed) Labs Reviewed  URINALYSIS,  ROUTINE W REFLEX MICROSCOPIC - Abnormal; Notable for the following components:      Result Value   APPearance CLOUDY (*)    Glucose, UA >=500 (*)    Nitrite POSITIVE (*)    Leukocytes,Ua LARGE (*)    WBC, UA >50 (*)    Bacteria, UA FEW (*)    All other components within normal limits  URINE CULTURE  BASIC METABOLIC PANEL  CBC  TROPONIN I (HIGH SENSITIVITY)    EKG EKG Interpretation  Date/Time:  Saturday May 27 2021 20:12:21 EDT Ventricular Rate:  70 PR Interval:  166 QRS Duration: 70 QT Interval:  376 QTC Calculation: 406 R Axis:   39 Text Interpretation: Normal sinus rhythm Anterior infarct , age undetermined T wave abnormality, consider lateral ischemia Abnormal ECG Confirmed by Fredia Sorrow [phone]) on 05/27/2021 9:34:22 PM  Radiology DG Chest 2 View  Result Date: 05/27/2021 CLINICAL DATA:  Chest pain for several weeks EXAM: CHEST - 2 VIEW COMPARISON:  01/07/2021 FINDINGS: The heart size and mediastinal contours are within normal limits. Both lungs are clear. The visualized skeletal structures are unremarkable. IMPRESSION: No active cardiopulmonary disease. Electronically Signed   By: Inez Catalina M.D.   On: 05/27/2021 20:45    Procedures Procedures   Medications Ordered in ED Medications - No data to display  ED Course  I have reviewed the triage vital signs and the nursing notes.  Pertinent labs & imaging results that were available during my care of the patient were reviewed by me and considered in my medical decision making (see chart for details).    MDM Rules/Calculators/A&P                          Work-up here for the chest pain EKG without acute changes chest x-ray without acute changes.  And troponin was normal.  Delta troponin not necessary symptoms been ongoing for 2 weeks.  And not worse currently.  Renal function does show a some renal insufficiency compared to a year ago.  No anemia  no leukocytosis.  Urinalysis suggestive of urinary tract  infection.  Urine sent for urine culture patient started on Keflex.  Blood pressures here are actually kind of rounded out at around 150/72.  I actually had one blood pressure measured at 685 systolic.  This was without any intervention.  Patient does need to follow-up with primary care doctor probably does need some tweaking of her blood pressure control.  And needs follow-up of her kidney function.  Urine culture sent.  She will be discharged on Keflex.  Patient will return for any severe headache chest pain trouble breathing or any strokelike symptoms. Final Clinical Impression(s) / ED Diagnoses Final diagnoses:  None    Rx / DC Orders ED Discharge Orders     None        Fredia Sorrow, MD 05/27/21 2317

## 2021-05-27 NOTE — ED Triage Notes (Signed)
Pt c/o hypertension x 3 weeks. Was seen at [facility] and was given clonidine to take with her Losartan. Pt went to the dentist and it was still high. Pt also c/o light chest tightness and difficulty urinating.

## 2021-05-27 NOTE — Discharge Instructions (Addendum)
10-year hypertensive meds.  Blood pressure here actually running more like 150/72.  So the systolic blood pressure is still elevated and probably warrants some tweaking to get it better.  But no emergency.  Renal function is a bit worse than it was a year ago so this will need to be followed by your primary care doctor.  Urinalysis suggestive of urinary tract infection.  Take the antibiotic Keflex as directed.  First dose given tonight.  Your primary care doctor can follow-up on the urine culture.  Make sure the urinary tract infection clears.  And also follow-up on your renal function.  To make sure that that improves as well.

## 2021-05-29 DIAGNOSIS — Z1331 Encounter for screening for depression: Secondary | ICD-10-CM | POA: Diagnosis not present

## 2021-05-29 DIAGNOSIS — E6609 Other obesity due to excess calories: Secondary | ICD-10-CM | POA: Diagnosis not present

## 2021-05-29 DIAGNOSIS — Z6832 Body mass index (BMI) 32.0-32.9, adult: Secondary | ICD-10-CM | POA: Diagnosis not present

## 2021-05-29 DIAGNOSIS — N342 Other urethritis: Secondary | ICD-10-CM | POA: Diagnosis not present

## 2021-05-30 LAB — URINE CULTURE: Culture: >=100000 — AB

## 2021-05-31 ENCOUNTER — Telehealth: Payer: Self-pay | Admitting: Emergency Medicine

## 2021-05-31 NOTE — Telephone Encounter (Signed)
Post ED Visit - Positive Culture Follow-up  Culture report reviewed by antimicrobial stewardship pharmacist: [place] Team []  Elenor Quinones, Pharm.D. []  Heide Guile, Pharm.D., BCPS AQ-ID []  Parks Neptune, Pharm.D., BCPS []  Alycia Rossetti, Pharm.D., BCPS []  Joes, [place].D., BCPS, AAHIVP []  Legrand Como, Pharm.D., BCPS, AAHIVP []  Salome Arnt, PharmD, BCPS []  Johnnette Gourd, PharmD, BCPS []  Hughes Better, PharmD, BCPS []  Leeroy Cha, PharmD []  Laqueta Linden, PharmD, BCPS []  Albertina Parr, PharmD  [place] Team []  Leodis Sias, PharmD []  Lindell Spar, PharmD []  Royetta Asal, PharmD []  Graylin Shiver, Rph []  Rema Fendt) Glennon Mac, PharmD []  Arlyn Dunning, PharmD []  [name], PharmD []  Dia Sitter, PharmD []  Leone Haven, PharmD []  Gretta Arab, PharmD []  Theodis Shove, PharmD []  Peggyann Juba, PharmD []  Reuel Boom, PharmD   Positive urine culture Treated with cephalexin, organism sensitive to the same and no further patient follow-up is required at this time.  Hazle Nordmann 05/31/2021, 1:03 PM

## 2021-06-06 ENCOUNTER — Other Ambulatory Visit: Payer: Self-pay | Admitting: Internal Medicine

## 2021-06-06 DIAGNOSIS — Z139 Encounter for screening, unspecified: Secondary | ICD-10-CM

## 2021-06-08 DIAGNOSIS — I878 Other specified disorders of veins: Secondary | ICD-10-CM | POA: Diagnosis not present

## 2021-06-08 DIAGNOSIS — Z6832 Body mass index (BMI) 32.0-32.9, adult: Secondary | ICD-10-CM | POA: Diagnosis not present

## 2021-06-08 DIAGNOSIS — I70213 Atherosclerosis of native arteries of extremities with intermittent claudication, bilateral legs: Secondary | ICD-10-CM | POA: Diagnosis not present

## 2021-06-08 DIAGNOSIS — E1165 Type 2 diabetes mellitus with hyperglycemia: Secondary | ICD-10-CM | POA: Diagnosis not present

## 2021-06-08 DIAGNOSIS — R252 Cramp and spasm: Secondary | ICD-10-CM | POA: Diagnosis not present

## 2021-06-08 DIAGNOSIS — I10 Essential (primary) hypertension: Secondary | ICD-10-CM | POA: Diagnosis not present

## 2021-06-08 DIAGNOSIS — E6609 Other obesity due to excess calories: Secondary | ICD-10-CM | POA: Diagnosis not present

## 2021-06-08 DIAGNOSIS — N3281 Overactive bladder: Secondary | ICD-10-CM | POA: Diagnosis not present

## 2021-06-09 ENCOUNTER — Other Ambulatory Visit (HOSPITAL_COMMUNITY): Payer: Self-pay | Admitting: Internal Medicine

## 2021-06-09 DIAGNOSIS — Z6832 Body mass index (BMI) 32.0-32.9, adult: Secondary | ICD-10-CM

## 2021-06-09 DIAGNOSIS — I70219 Atherosclerosis of native arteries of extremities with intermittent claudication, unspecified extremity: Secondary | ICD-10-CM

## 2021-06-09 DIAGNOSIS — E6609 Other obesity due to excess calories: Secondary | ICD-10-CM

## 2021-06-14 ENCOUNTER — Ambulatory Visit (HOSPITAL_COMMUNITY)
Admission: RE | Admit: 2021-06-14 | Discharge: 2021-06-14 | Disposition: A | Discharge status: Home / Self Care | Payer: BC Managed Care – PPO | Source: Ambulatory Visit | Attending: Internal Medicine | Admitting: Internal Medicine

## 2021-06-14 ENCOUNTER — Other Ambulatory Visit: Payer: Self-pay

## 2021-06-14 DIAGNOSIS — I70219 Atherosclerosis of native arteries of extremities with intermittent claudication, unspecified extremity: Secondary | ICD-10-CM | POA: Insufficient documentation

## 2021-06-14 DIAGNOSIS — Z6832 Body mass index (BMI) 32.0-32.9, adult: Secondary | ICD-10-CM | POA: Diagnosis not present

## 2021-06-14 DIAGNOSIS — M79662 Pain in left lower leg: Secondary | ICD-10-CM | POA: Diagnosis not present

## 2021-06-14 DIAGNOSIS — E6609 Other obesity due to excess calories: Secondary | ICD-10-CM | POA: Diagnosis not present

## 2021-06-14 DIAGNOSIS — M79661 Pain in right lower leg: Secondary | ICD-10-CM | POA: Diagnosis not present

## 2021-06-23 ENCOUNTER — Other Ambulatory Visit: Payer: Self-pay

## 2021-06-23 ENCOUNTER — Ambulatory Visit (HOSPITAL_COMMUNITY)
Admission: RE | Admit: 2021-06-23 | Discharge: 2021-06-23 | Disposition: A | Discharge status: Home / Self Care | Payer: BC Managed Care – PPO | Source: Ambulatory Visit | Attending: Internal Medicine | Admitting: Internal Medicine

## 2021-06-23 DIAGNOSIS — Z139 Encounter for screening, unspecified: Secondary | ICD-10-CM

## 2021-06-23 DIAGNOSIS — Z1231 Encounter for screening mammogram for malignant neoplasm of breast: Secondary | ICD-10-CM | POA: Insufficient documentation

## 2021-06-23 IMAGING — MG MM DIGITAL SCREENING BILAT W/ TOMO AND CAD
8 series · 8 of 24 positions shown · non-contrast
Comparison: Previous exam(s).

ACR Breast Density Category a: The breast tissue is almost entirely
fatty.

CLINICAL DATA: Screening.

EXAM:
DIGITAL SCREENING BILATERAL MAMMOGRAM WITH TOMOSYNTHESIS AND CAD
TECHNIQUE: Bilateral screening digital craniocaudal and mediolateral oblique
mammograms were obtained. Bilateral screening digital breast
tomosynthesis was performed. The images were evaluated with
computer-aided detection.

[R CC synth-2D]
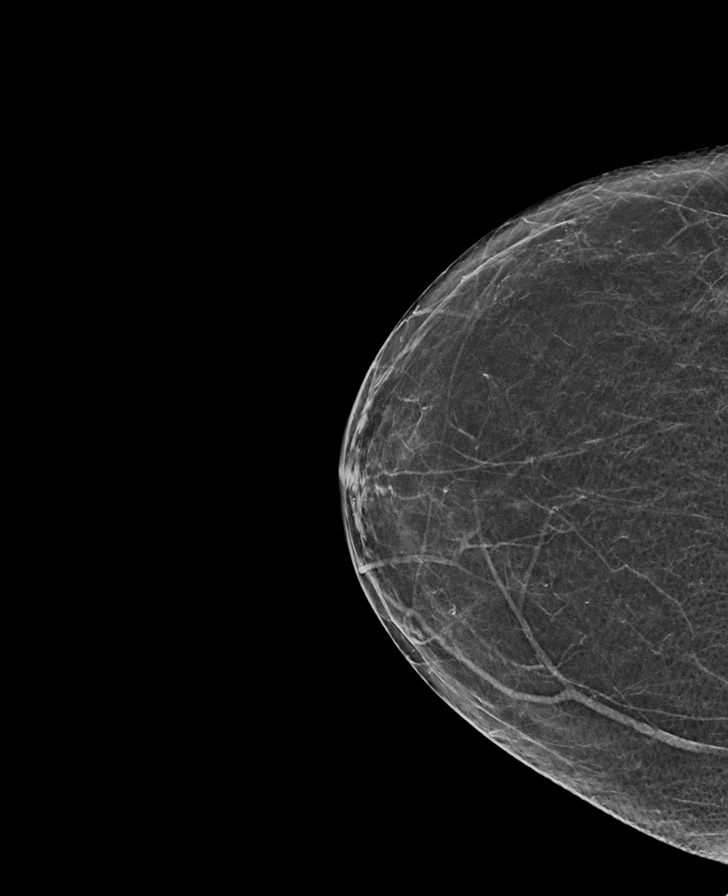

[L MLO synth-2D]
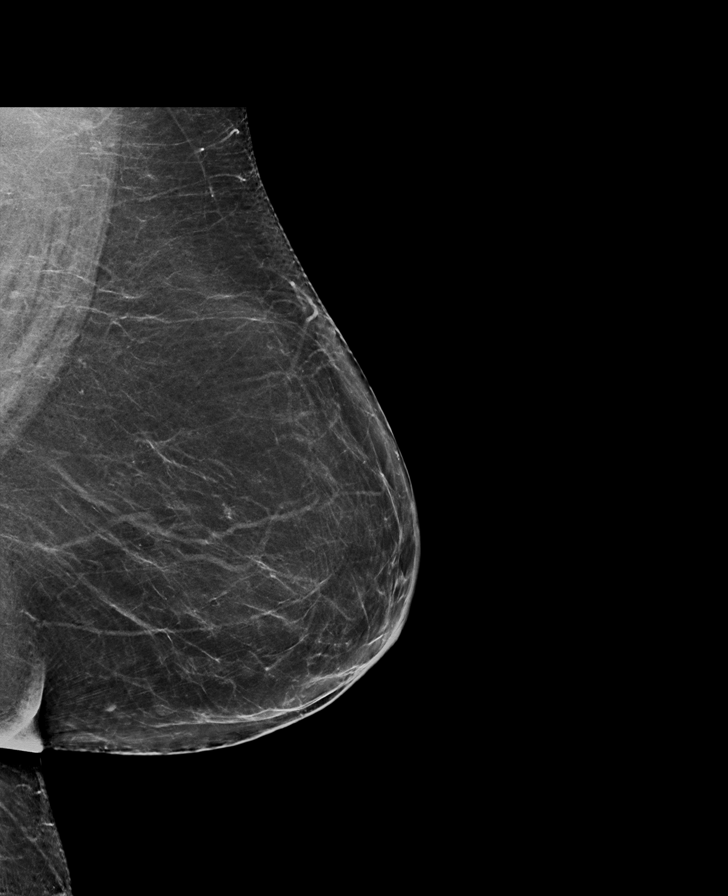

[R MLO synth-2D]
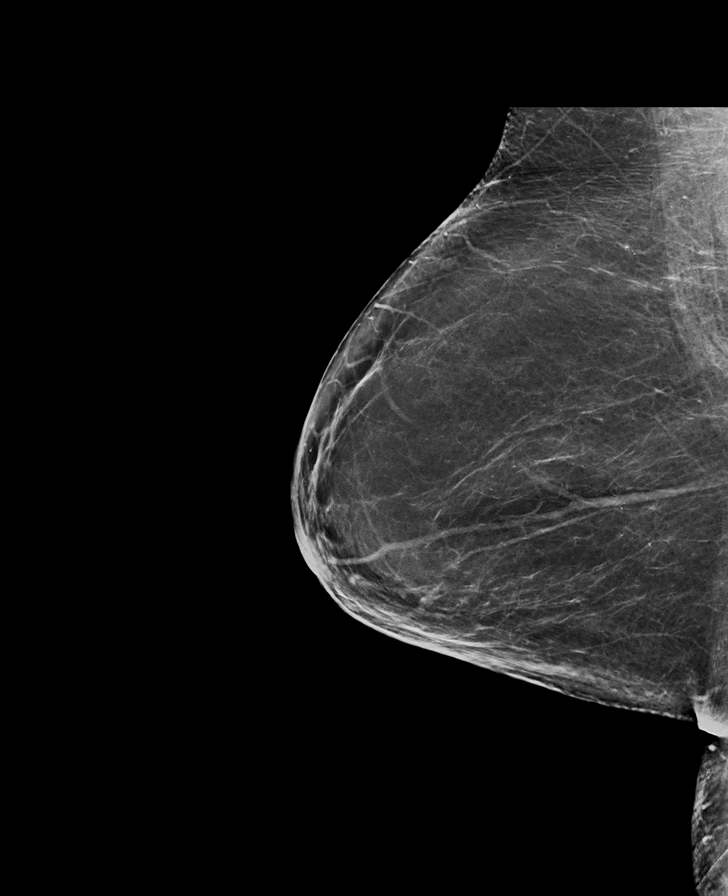

[L CC synth-2D]
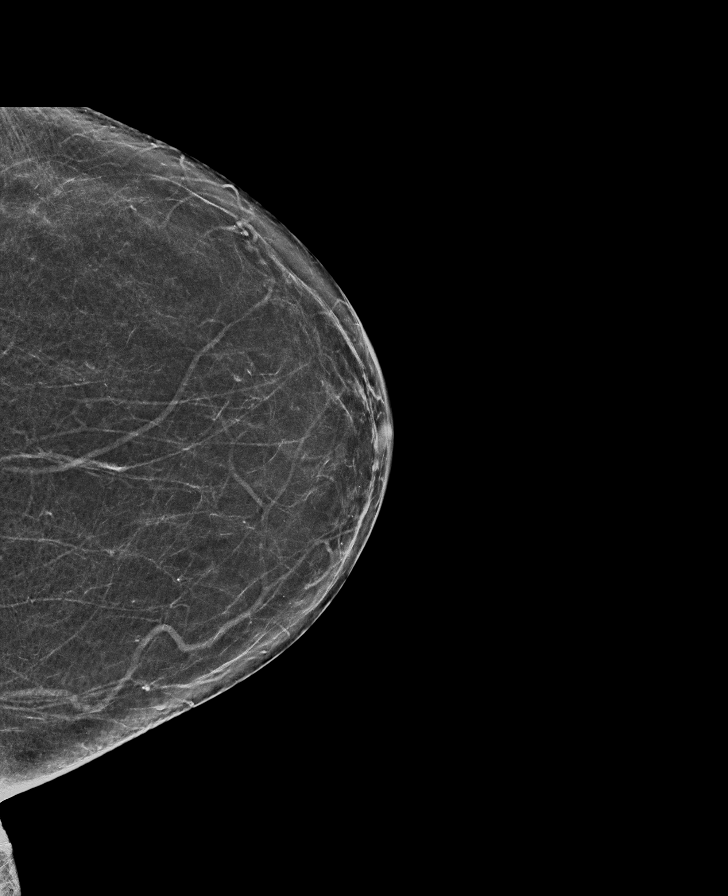

[R MLO tomo · tomo slice 35/69.0]
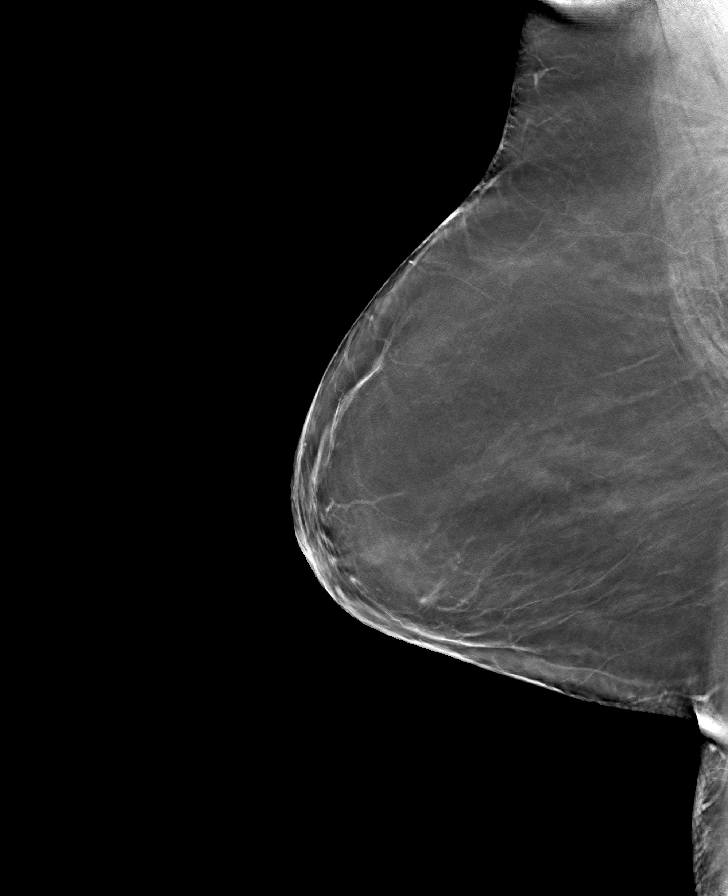

[L CC tomo · tomo slice 31/62.0]
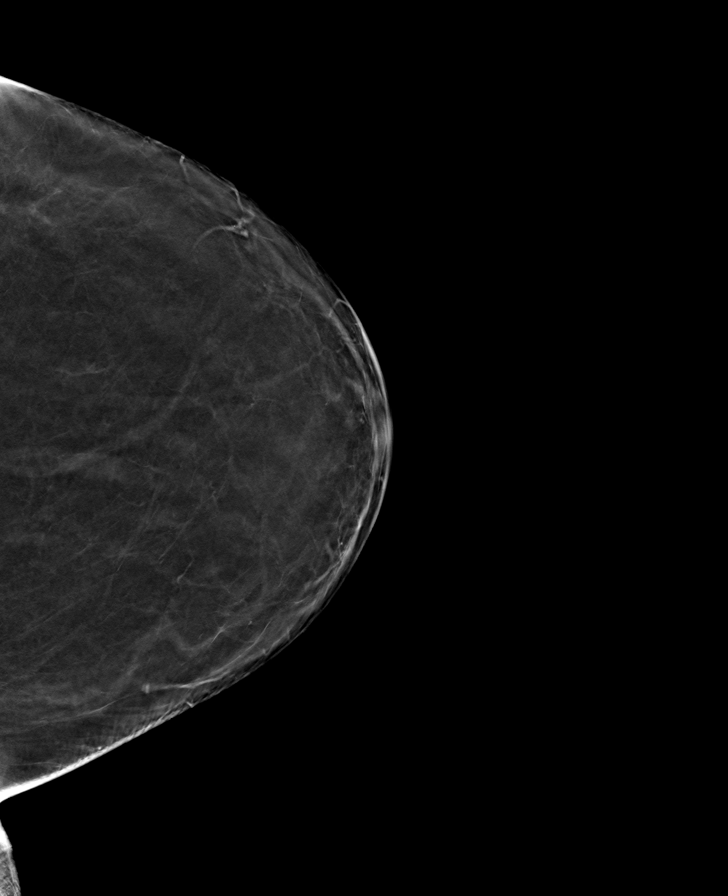

[R CC tomo · tomo slice 27/53.0]
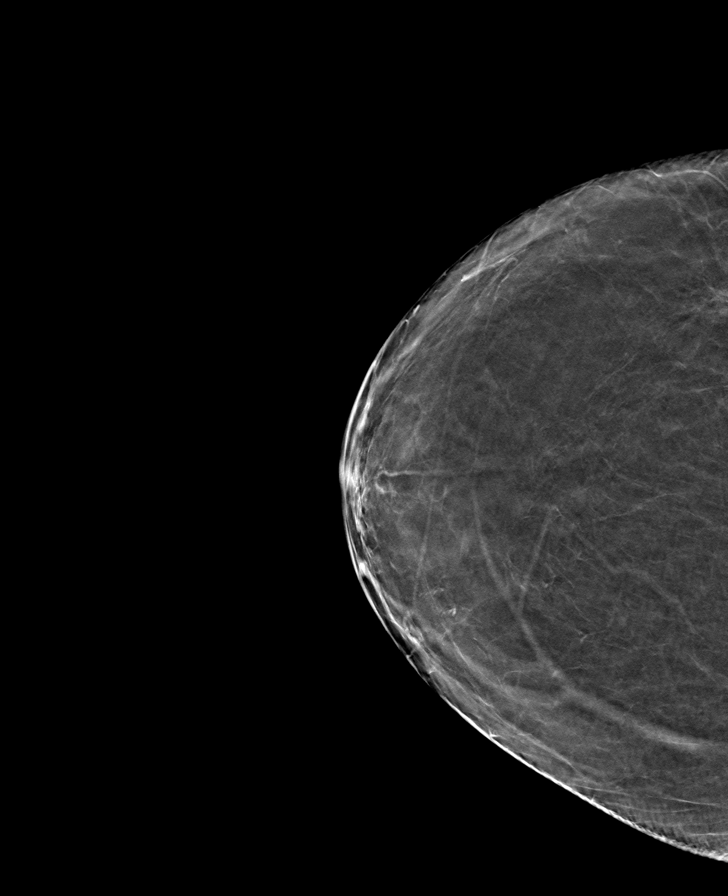

[L MLO tomo · tomo slice 37/72.0]
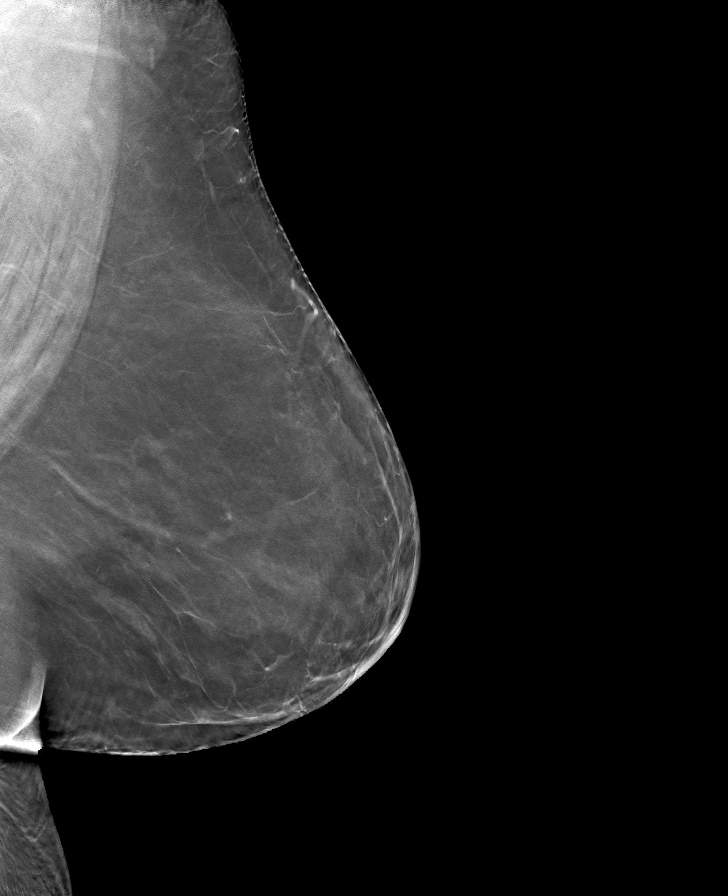

[8 of 24 positions shown; findings below may reference images not displayed]

FINDINGS: There are no findings suspicious for malignancy.
IMPRESSION: No mammographic evidence of malignancy. A result letter of this
screening mammogram will be mailed directly to the patient.

RECOMMENDATION:
Screening mammogram in one year. (Code:[8U])

BI-RADS CATEGORY  1: Negative.

## 2021-06-29 DIAGNOSIS — T466X5A Adverse effect of antihyperlipidemic and antiarteriosclerotic drugs, initial encounter: Secondary | ICD-10-CM | POA: Diagnosis not present

## 2021-07-18 NOTE — Progress Notes (Signed)
Cardiology Office Note   Date:  07/19/2021   ID:  Amanda Mendez, DOB 1956-07-04, MRN [id number]  PCP:  Redmond School, MD  Cardiologist:   None Referring:  Redmond School, MD  Chief Complaint  Patient presents with   Coronary Artery Disease       History of Present Illness: Amanda Mendez is a 65 y.o. female who presents for evaluation of coronary artery disease and history of difficult to control hypertension.  I last saw her in 2015.  She has had coronary artery disease in the distant past.  She has had a history of chronic diastolic dysfunction.  Last echocardiogram in 2015 suggested moderate left ventricular hypertrophy but well-preserved ejection fraction.  There is a somewhat suboptimal study.  She has not come back to see me in quite a while but she has been out of insurance.  She says she has been feeling great.  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  With her level of activity she denies any cardiovascular symptoms. The patient denies any new symptoms such as chest discomfort, neck or arm discomfort. There has been no new shortness of breath, PND or orthopnea. There have been no reported palpitations, presyncope or syncope.    Past Medical History:  Diagnosis Date   Anginal pain ([place])    Coronary artery disease    angioplasty 9/13   Exertional dyspnea    "usually; today it was with nothing" (11/03/2012)   GERD (gastroesophageal reflux disease)    Hypercholesterolemia with hypertriglyceridemia    Hypertension    Kidney stones    "I've had them 5 times; lithotripsy 1st time; flushed out  all the others" (11/03/2012)   Obesity (BMI 30-39.9)    Pneumonia 2012   Sinus headache    "weekly" (11/03/2012)   Type II diabetes mellitus ([place]) 2007    Past Surgical History:  Procedure Laterality Date   CARDIAC CATHETERIZATION  11/25/29013   CORONARY ANGIOPLASTY WITH STENT PLACEMENT  08/2012   "1"   HEMATOMA EVACUATION  09/22/2012   Procedure: EVACUATION  HEMATOMA;  Surgeon: Wynonia Sours, MD;  Location: [facility];  Service: Orthopedics;  Laterality: Right;  Evacuation Hematoma right hand   LEFT AND RIGHT HEART CATHETERIZATION WITH CORONARY ANGIOGRAM N/A 08/14/2012   Procedure: LEFT AND RIGHT HEART CATHETERIZATION WITH CORONARY ANGIOGRAM;  Surgeon: Minus Breeding, MD;  Location: [facility] CATH LAB;  Service: Cardiovascular;  Laterality: N/A;   LEFT HEART CATHETERIZATION WITH CORONARY ANGIOGRAM N/A 11/03/2012   Procedure: LEFT HEART CATHETERIZATION WITH CORONARY ANGIOGRAM;  Surgeon: Jolaine Artist, MD;  Location: [facility] CATH LAB;  Service: Cardiovascular;  Laterality: N/A;   LITHOTRIPSY  1990's   PERCUTANEOUS CORONARY STENT INTERVENTION (PCI-S)  08/14/2012   Procedure: PERCUTANEOUS CORONARY STENT INTERVENTION (PCI-S);  Surgeon: Minus Breeding, MD;  Location: [facility] CATH LAB;  Service: Cardiovascular;;   SKIN BIOPSY  2012   "off my back; it was nothing fatal; infection caused it" (11/03/2012)   TUBAL LIGATION  1980     Current Outpatient Medications  Medication Sig Dispense Refill   aspirin EC 81 MG tablet Take 81 mg by mouth daily. Swallow whole.     Cholecalciferol (VITAMIN D3) 1.25 MG (50000 UT) TABS Take 5,000 Units by mouth.     cyclobenzaprine (FLEXERIL) 10 MG tablet Take 10 mg by mouth 3 (three) times daily as needed.     glipiZIDE (GLUCOTROL XL) 10 MG 24 hr tablet Take 10 mg by mouth 2 (two) times daily  at 8 am and 10 pm.     losartan-hydrochlorothiazide (HYZAAR) 100-25 MG per tablet Take 1 tablet by mouth every evening.     meloxicam (MOBIC) 7.5 MG tablet Take 7.5 mg by mouth daily.     metoprolol (LOPRESSOR) 50 MG tablet Take 50 mg by mouth 2 (two) times daily.     Misc Natural Products (FIBER 7 PO) Take by mouth daily.     Multiple Vitamin (MULTIVITAMIN) capsule Take 1 capsule by mouth daily.     omeprazole (PRILOSEC) 20 MG capsule Take 20 mg by mouth daily.     oxybutynin (DITROPAN) 5 MG tablet Take 5 mg by mouth 2 (two) times  daily.     cephALEXin (KEFLEX) 500 MG capsule Take 1 capsule (500 mg total) by mouth 4 (four) times daily. (Patient not taking: Reported on 07/19/2021) 28 capsule 0   clopidogrel (PLAVIX) 75 MG tablet Take 75 mg by mouth daily. (Patient not taking: Reported on 07/19/2021)     ergocalciferol (VITAMIN D2) 1.25 MG (50000 UT) capsule TAKE 1 CAPSULE TWICE A WEEK (Patient not taking: Reported on 07/19/2021)     No current facility-administered medications for this visit.    Allergies:   Dapagliflozin and Codeine    Social History:  The patient  reports that she quit smoking about 9 years ago. Her smoking use included cigarettes. She has a 55.50 pack-year smoking history. She has never used smokeless tobacco. She reports current alcohol use. She reports that she does not use drugs.   Family History:  The patient's family history includes Crohn's disease in her brother; Heart failure in her mother.    ROS:  Please see the history of present illness.   Otherwise, review of systems are positive for none.   All other systems are reviewed and negative.    PHYSICAL EXAM: VS:  BP 124/78   Pulse 68   Ht '5\' 2"'$  (1.575 m)   Wt 177 lb (80.3 kg)   BMI 32.37 kg/m  , BMI Body mass index is 32.37 kg/m. GENERAL:  Well appearing HEENT:  Pupils equal round and reactive, fundi not visualized, oral mucosa unremarkable NECK:  No jugular venous distention, waveform within normal limits, carotid upstroke brisk and symmetric, no bruits, no thyromegaly LYMPHATICS:  No cervical, inguinal adenopathy LUNGS:  Clear to auscultation bilaterally BACK:  No CVA tenderness CHEST:  Unremarkable HEART:  PMI not displaced or sustained,S1 and S2 within normal limits, no S3, no S4, no clicks, no rubs, no murmurs ABD:  Flat, positive bowel sounds normal in frequency in pitch, no bruits, no rebound, no guarding, no midline pulsatile mass, no hepatomegaly, no splenomegaly EXT:  2 plus pulses throughout, no edema, no cyanosis no  clubbing SKIN:  No rashes no nodules NEURO:  Cranial nerves II through XII grossly intact, motor grossly intact throughout PSYCH:  Cognitively intact, oriented to person place and time    EKG:  EKG is not ordered today. The ekg ordered 05/27/2021 demonstrates sinus rhythm, rate 70, axis within normal limits, old anteroseptal infarct, nonspecific T wave flattening.   Recent Labs: 05/27/2021: BUN 31; Creatinine, Ser 1.36; Hemoglobin 14.1; Platelets 290; Potassium 3.8; Sodium 135    Lipid Panel    Component Value Date/Time   CHOL 275 (H) 08/14/2012 0251   TRIG 557 (H) 08/14/2012 0251   HDL 43 08/14/2012 0251   CHOLHDL 6.4 08/14/2012 0251   VLDL UNABLE TO CALCULATE IF TRIGLYCERIDE OVER 400 mg/dL 08/14/2012 0251   LDLCALC UNABLE TO  CALCULATE IF TRIGLYCERIDE OVER 400 mg/dL 08/14/2012 0251      Wt Readings from Last 3 Encounters:  07/19/21 177 lb (80.3 kg)  05/27/21 175 lb 0.7 oz (79.4 kg)  11/15/20 175 lb (79.4 kg)      Other studies Reviewed: Additional studies/ records that were reviewed today include: Labs. Review of the above records demonstrates:  Please see elsewhere in the note.     ASSESSMENT AND PLAN:  CAD - She has had no new chest discomfort.  She needs aggressive risk reduction and we had a long discussion about the fact that she is not pursuing this and she is at high risk for future cardiovascular events.   Diabetes mellitus  - A1c was most recently 11.2.  She is having this actively managed now.  She understands the importance of better control.   I think she would benefit from SGLT2 inhibitor or GLP-1 receptor agonist.  However, I will defer to her primary providers.  I would suggest she see a diabetologist but she says it is very hard for her to get to appointments..  Hypertension - The blood pressure is controlled.  She says its been high when she goes to the dentist which was one of the reason she was sent back.  We talked about taking an extra half of  metoprolol prior to dental procedures.  She will also keep a blood pressure diary so that I can understand whether we are missing poorly controlled hypertension.    Hypercholesterolemia with hypertriglyceridemia - LDL was 140 in December.  Triglycerides not controlled in keeping with his sugar.  She does not have this better controlled.  She has not tolerated statins because of elevated liver enzymes.  I am going to refer her to our [facility] to start PCSK9.   Diastolic heart failure - She has had a history of this but she seems to be euvolemic.  No change in therapy.    Current medicines are reviewed at length with the patient today.  The patient does not have concerns regarding medicines.  The following changes have been made:  no change  Labs/ tests ordered today include: None  Orders Placed This Encounter  Procedures   AMB Referral to [facility]      Disposition:   FU with 1 year   Signed, Minus Breeding, MD  07/19/2021 2:53 PM    Amanda Mendez

## 2021-07-19 ENCOUNTER — Encounter: Payer: Self-pay | Admitting: Cardiology

## 2021-07-19 ENCOUNTER — Ambulatory Visit (INDEPENDENT_AMBULATORY_CARE_PROVIDER_SITE_OTHER): Payer: BC Managed Care – PPO | Admitting: Cardiology

## 2021-07-19 ENCOUNTER — Other Ambulatory Visit: Payer: Self-pay

## 2021-07-19 VITALS — BP 124/78 | HR 68 | Ht 62.0 in | Wt 177.0 lb

## 2021-07-19 DIAGNOSIS — E785 Hyperlipidemia, unspecified: Secondary | ICD-10-CM | POA: Diagnosis not present

## 2021-07-19 DIAGNOSIS — I2510 Atherosclerotic heart disease of native coronary artery without angina pectoris: Secondary | ICD-10-CM

## 2021-07-19 DIAGNOSIS — E118 Type 2 diabetes mellitus with unspecified complications: Secondary | ICD-10-CM

## 2021-07-19 DIAGNOSIS — I10 Essential (primary) hypertension: Secondary | ICD-10-CM

## 2021-07-19 NOTE — Patient Instructions (Signed)
Medication Instructions:  The current medical regimen is effective;  continue present plan and medications.  *If you need a refill on your cardiac medications before your next appointment, please call your pharmacy*  Please keep a blood pressure diary and turn it in to Dr Percival Spanish.  You have been referred to our [facility] to discuss further treatment options.  You will be contacted for an appointment.   Follow-Up: At [facility], you and your health needs are our priority.  As part of our continuing mission to provide you with exceptional heart care, we have created designated Provider Care Teams.  These Care Teams include your primary Cardiologist (physician) and Advanced Practice Providers (APPs -  Physician Assistants and Nurse Practitioners) who all work together to provide you with the care you need, when you need it.  We recommend signing up for the patient portal called "MyChart".  Sign up information is provided on this After Visit Summary.  MyChart is used to connect with patients for Virtual Visits (Telemedicine).  Patients are able to view lab/test results, encounter notes, upcoming appointments, etc.  Non-urgent messages can be sent to your provider as well.   To learn more about what you can do with MyChart, go to [email or web address].    Your next appointment:   1 year(s)  The format for your next appointment:   In Person  Provider:   Minus Breeding, MD   Thank you for choosing [facility]!!

## 2021-08-04 ENCOUNTER — Other Ambulatory Visit: Payer: Self-pay

## 2021-08-04 ENCOUNTER — Ambulatory Visit (INDEPENDENT_AMBULATORY_CARE_PROVIDER_SITE_OTHER): Payer: BC Managed Care – PPO | Admitting: Pharmacist

## 2021-08-04 DIAGNOSIS — E1165 Type 2 diabetes mellitus with hyperglycemia: Secondary | ICD-10-CM | POA: Diagnosis not present

## 2021-08-04 DIAGNOSIS — I5031 Acute diastolic (congestive) heart failure: Secondary | ICD-10-CM | POA: Diagnosis not present

## 2021-08-04 DIAGNOSIS — E785 Hyperlipidemia, unspecified: Secondary | ICD-10-CM

## 2021-08-04 MED ORDER — ROSUVASTATIN CALCIUM 10 MG PO TABS: 10.0000 mg | ORAL_TABLET | Freq: Every day | ORAL | 3 refills | Status: DC

## 2021-08-04 NOTE — Progress Notes (Signed)
Patient ID: Amanda Mendez                 DOB: 1956/07/19                    MRN: [id number]     HPI: Amanda Mendez is a 65 y.o. female patient referred to lipid clinic by Dr. Percival Spanish. PMH is significant for coronary artery disease, HLD, diastolic CHF, DM and history of difficult to control hypertension.  Patient was seen by Dr. Percival Spanish recently. Referred to the lipid clinic due to hx of LFT elevation on statins.  Patient presents to lipid clinic today. Patient has been off of cholesterol medications for about 1.5 years she thinks. 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 Thinks [employer] is her primary.  States she has been on 4 or 5 different statins and they always tell her to stop taking.  Current Medications: none Intolerances: atorvastatin '40mg'$  daily (LFT elevation), rosuvastatin Risk Factors: CAD, DM LDL goal: <55  Diet: breakfast:steak and egg bisquett (may take off some of the bread) Lunch:pack of nabs Dinner: [place], green beans, broccoli (w/ cheese), mix greens, corn, sweet potato, zucchini Limit bread Trying to reduce fruit intake  Exercise: not discussed  Family History: The patient's family history includes Crohn's disease in her brother; Heart failure in her mother.   Social History:  The patient  reports that she quit smoking about 9 years ago. Her smoking use included cigarettes. She has a 55.50 pack-year smoking history. She has never used smokeless tobacco. She reports current alcohol use. She reports that she does not use drugs.   Labs:11/26/21 TC 284, HDL 42, LDL 140, TG 548 (no medications)  Past Medical History:  Diagnosis Date   Anginal pain ([place])    Coronary artery disease    angioplasty 9/13   Exertional dyspnea    "usually; today it was with nothing" (11/03/2012)   GERD (gastroesophageal reflux disease)    Hypercholesterolemia with hypertriglyceridemia    Hypertension    Kidney stones    "I've had them 5 times; lithotripsy 1st  time; flushed out  all the others" (11/03/2012)   Obesity (BMI 30-39.9)    Pneumonia 2012   Sinus headache    "weekly" (11/03/2012)   Type II diabetes mellitus ([place]) 2007    Current Outpatient Medications on File Prior to Visit  Medication Sig Dispense Refill   aspirin EC 81 MG tablet Take 81 mg by mouth daily. Swallow whole.     cephALEXin (KEFLEX) 500 MG capsule Take 1 capsule (500 mg total) by mouth 4 (four) times daily. (Patient not taking: Reported on 07/19/2021) 28 capsule 0   Cholecalciferol (VITAMIN D3) 1.25 MG (50000 UT) TABS Take 5,000 Units by mouth.     clopidogrel (PLAVIX) 75 MG tablet Take 75 mg by mouth daily. (Patient not taking: Reported on 07/19/2021)     cyclobenzaprine (FLEXERIL) 10 MG tablet Take 10 mg by mouth 3 (three) times daily as needed.     ergocalciferol (VITAMIN D2) 1.25 MG (50000 UT) capsule TAKE 1 CAPSULE TWICE A WEEK (Patient not taking: Reported on 07/19/2021)     glipiZIDE (GLUCOTROL XL) 10 MG 24 hr tablet Take 10 mg by mouth 2 (two) times daily at 8 am and 10 pm.     losartan-hydrochlorothiazide (HYZAAR) 100-25 MG per tablet Take 1 tablet by mouth every evening.     meloxicam (MOBIC) 7.5 MG tablet Take 7.5 mg by mouth daily.  metoprolol (LOPRESSOR) 50 MG tablet Take 50 mg by mouth 2 (two) times daily.     Misc Natural Products (FIBER 7 PO) Take by mouth daily.     Multiple Vitamin (MULTIVITAMIN) capsule Take 1 capsule by mouth daily.     omeprazole (PRILOSEC) 20 MG capsule Take 20 mg by mouth daily.     oxybutynin (DITROPAN) 5 MG tablet Take 5 mg by mouth 2 (two) times daily.     No current facility-administered medications on file prior to visit.    Allergies  Allergen Reactions   Dapagliflozin Itching   Codeine Nausea And Vomiting    But has tolerated morphine    Assessment/Plan:  1. Hyperlipidemia - LDL is above goal of <55. I reviewed patients records. I can see where she was on atorvastatin in 2015. AST 44, ALT 47. ALT 51 ALT 49 in 2017  per [facility]. I do not see any other record in epic or [facility] of elevated LFT. These are very mild elevations that don't warrant stopping of statins. We discussed re- challenge vs PCKS9i. After discussion with patient she would like to re challenge with another statin first. If LFT go > 3X ULN or LDL goal not reached then will try PCSK9i. She was taught how to do the injections. She has an apt with PCP on 9/30. She will have her LFT checked then.  2. DM- We discussed diet in detail. Patient given some ideas for meals and how to decrease carb intake.    Thank you,   Ramond Dial, Pharm.D, BCPS, CPP [place]  [id number], [place], [place] [address]  Phone: [phone]; Fax: [phone]

## 2021-08-04 NOTE — Patient Instructions (Addendum)
Try making your own [place] sausage and eat it with an egg in the AM Try Natural PB  Snack on nuts  Start taking rosuvastatin '10mg'$  daily Recheck liver function tests at PCP office 9/30  TIPS for Living a healthier life  SUGAR  Sugar is a huge problem in the modern day diet. Sugar is a HUGE contributor to heart disease, diabetes, high triglyceride levels, fatty liver diease and obesity. Sugar is hidden in almost all packaged foods/beverages. Added sugar is extra sugar that is added beyond what is naturally found. It adds no nutritional benefit to your body and can cause major harm. The American Heart Association recommends limiting added sugars to no more than 25g for women and 36 grams for men per day.  There are many names for sugar maltose, sucrose (names ending in "ose"), high fructose corn syrup, molasses, cane sugar, corn sweetener, raw sugar, syrup, honey or fruit juice concentrate.   One of the best ways to limit your added sugars is to stop drinking sweetened beverages such as soda, sweet tea, fruit juice or fancy coffee's. There is 65g of added sugars in one 20oz bottle of Coke!! That is equal to 6 donuts.   Pay attention and read all nutrition facts labels. Below is an examples of a nutrition facts label. The #1 is showing you the total sugars where the # 2 is showing you the added sugars. This one severing has almost the max amount of added sugars per day!  Watch out for items that say "low fat" or "no added sugar" as these products are typically very high in sugar. The food industry uses these terms to fool you into thinking they are healthy.  For more information on the dangers of sugar watch WHY Sugar is as Bad as Alcohol (Fructose, The Liver Toxin) on YouTube.    EXERCISE  Exercise is good. We've all heard that. In an ideal world, we would all have time and resources to  get plenty of it. When you are active your heart pumps more efficiently and you will feel better.   Multiple studies show that even walking regularly has benefits that include living a longer life.  The American Heart Association recommends 90-150 minutes per week of exercise (30 minutes  per day most days of the week). You can do this in any increment you wish. Nine or more  10-minute walks count. So does an hour-long exercise class. Break the time apart into what will  work in your life. Some of the best things you can do include walking briskly, jogging, cycling or  swimming laps. Not everyone is ready to "exercise." Sometimes we need to start with just getting active. Here  are some easy ways to be more active throughout the day:  Take the stairs instead of the elevator  Go for a 10-15 minute walk during your lunch break (find a friend to make it more enjoyable)  When shopping, park at the back of the parking lot  If you take public transportation, get off one stop early and walk the extra distance  Pace around while making phone calls (most of [place] are not attached to phone cords any longer!) Check with your doctor if you aren't sure what your limitations may be. Always remember to drink plenty of water when doing any type of exercise. Don't feel like a failure if you're not getting the 90-150 minutes per week. If you started by being  a couch potato, then just a 10-minute  walk each day is a huge improvement. Start with little  victories and work your way up.   Healthy Eating Tips  When looking to improve your eating habits, whether to lose weight, lower blood pressure or just be healthier, it helps to know what a serving size is.   Grains 1 slice of bread,  bagel,  cup pasta or rice  Vegetables 1 cup fresh or raw vegetables,  cup cooked or canned Fruits 1 piece of medium sized fruit,  cup canned,   Meats/Proteins  cup dried       1 oz meat, 1 egg,  cup cooked beans, nuts or seeds  Dairy        Fats Individual yogurt container, 1 cup (8oz)    1 teaspoon margarine/butter  or vegetable  milk or milk alternative, 1 slice of cheese          oil; 1 tablespoon mayonnaise or salad dressing                  Plan ahead: make a menu of the meals for a week then create a grocery list to go with  that menu. Consider meals that easily stretch into a night of leftovers, such as stews or  casseroles. Or consider making two of your favorite meal and put one in the freezer or fridge for  another night.  When you get home from the grocery store wash and prepare your vegetables and fruits.  Then when you need them they are ready to go.  Tips for going to the grocery store:  [place] store or generic brands  Check the weekly ad from your store on-line or in their in-store flyer  Look at the unit price on the shelf tag to compare/contrast the costs of different items  Buy fruits/vegetables in season  Carrots, bananas and apples are low-cost, naturally healthy items  If meats or frozen vegetables are on sale, buy some extras and put in your freezer  Limit buying prepared or "ready to eat" items, even if they are pre-made salads or fruit snacks  Do not shop when you're hungry  Foods at eye level tend to be more expensive. Look on the high and low shelves for deals.  Consider shopping at the farmer's market for fresh foods in season.  Choose canned tuna or salmon instead of fresh  Avoid the cookie and chip aisles (these are expensive, high in calories and low in  nutritional value). Shop on the outside of the grocery store.  Aim to have one 12 hour fast each day. This means no eating after dinner until breakfast. For example, if you eat dinner around 6 PM then you would not eat anything until 6 AM the next day. This is a great way to help lower your insulin levels, lose weight and reduce your blood pressure.   Healthy food preparations:  If you can't get lean hamburger, be sure to drain the fat when cooking  Steam, saut (in olive oil), grill or bake foods  Experiment with  different seasonings to avoid adding salt to your foods. Kosher salt, sea salt and Himalayan salt are all still salt and should be avoided Try seasoning food with onion, garlic, thyme, rosemary, basil ect. Onion powder or garlic powder is ok. Avoid if it says salt (ie garlic salt).        Resources: American Heart Association - InstantFinish.fi Go to the Healthy Living tab to get more information American Diabetes Association - www.diabetes.org  You don't have to be diabetic - check out the Food and Fitness tab

## 2021-08-07 DIAGNOSIS — J020 Streptococcal pharyngitis: Secondary | ICD-10-CM | POA: Diagnosis not present

## 2021-08-07 DIAGNOSIS — R059 Cough, unspecified: Secondary | ICD-10-CM | POA: Diagnosis not present

## 2021-08-07 DIAGNOSIS — J029 Acute pharyngitis, unspecified: Secondary | ICD-10-CM | POA: Diagnosis not present

## 2021-08-07 DIAGNOSIS — R0981 Nasal congestion: Secondary | ICD-10-CM | POA: Diagnosis not present

## 2021-08-30 DIAGNOSIS — M76892 Other specified enthesopathies of left lower limb, excluding foot: Secondary | ICD-10-CM | POA: Diagnosis not present

## 2021-08-30 DIAGNOSIS — E119 Type 2 diabetes mellitus without complications: Secondary | ICD-10-CM | POA: Diagnosis not present

## 2021-08-30 DIAGNOSIS — E785 Hyperlipidemia, unspecified: Secondary | ICD-10-CM | POA: Diagnosis not present

## 2021-08-30 DIAGNOSIS — M25552 Pain in left hip: Secondary | ICD-10-CM | POA: Diagnosis not present

## 2021-08-30 DIAGNOSIS — Z79899 Other long term (current) drug therapy: Secondary | ICD-10-CM | POA: Diagnosis not present

## 2021-08-30 DIAGNOSIS — I10 Essential (primary) hypertension: Secondary | ICD-10-CM | POA: Diagnosis not present

## 2021-08-30 DIAGNOSIS — M5432 Sciatica, left side: Secondary | ICD-10-CM | POA: Diagnosis not present

## 2021-08-30 DIAGNOSIS — Z7984 Long term (current) use of oral hypoglycemic drugs: Secondary | ICD-10-CM | POA: Diagnosis not present

## 2021-08-30 DIAGNOSIS — M79605 Pain in left leg: Secondary | ICD-10-CM | POA: Diagnosis not present

## 2021-09-03 DIAGNOSIS — M5432 Sciatica, left side: Secondary | ICD-10-CM | POA: Diagnosis not present

## 2021-09-06 DIAGNOSIS — M5432 Sciatica, left side: Secondary | ICD-10-CM | POA: Diagnosis not present

## 2021-09-06 DIAGNOSIS — E1165 Type 2 diabetes mellitus with hyperglycemia: Secondary | ICD-10-CM | POA: Diagnosis not present

## 2021-09-06 DIAGNOSIS — E6609 Other obesity due to excess calories: Secondary | ICD-10-CM | POA: Diagnosis not present

## 2021-09-06 DIAGNOSIS — I10 Essential (primary) hypertension: Secondary | ICD-10-CM | POA: Diagnosis not present

## 2021-09-09 DIAGNOSIS — I10 Essential (primary) hypertension: Secondary | ICD-10-CM | POA: Diagnosis not present

## 2021-09-09 DIAGNOSIS — M5136 Other intervertebral disc degeneration, lumbar region: Secondary | ICD-10-CM | POA: Diagnosis not present

## 2021-09-09 DIAGNOSIS — M79605 Pain in left leg: Secondary | ICD-10-CM | POA: Diagnosis not present

## 2021-09-09 DIAGNOSIS — E1165 Type 2 diabetes mellitus with hyperglycemia: Secondary | ICD-10-CM | POA: Diagnosis not present

## 2021-09-09 DIAGNOSIS — M545 Low back pain, unspecified: Secondary | ICD-10-CM | POA: Diagnosis not present

## 2021-09-10 ENCOUNTER — Emergency Department (HOSPITAL_COMMUNITY): Payer: BC Managed Care – PPO

## 2021-09-10 ENCOUNTER — Encounter (HOSPITAL_COMMUNITY): Payer: Self-pay | Admitting: Emergency Medicine

## 2021-09-10 ENCOUNTER — Inpatient Hospital Stay (HOSPITAL_COMMUNITY)
Admission: EM | Admit: 2021-09-10 | Discharge: 2021-09-16 | DRG: 300 | Disposition: A | Discharge status: Home Health | Payer: BC Managed Care – PPO | Attending: Internal Medicine | Admitting: Internal Medicine

## 2021-09-10 ENCOUNTER — Other Ambulatory Visit: Payer: Self-pay

## 2021-09-10 DIAGNOSIS — I739 Peripheral vascular disease, unspecified: Secondary | ICD-10-CM

## 2021-09-10 DIAGNOSIS — I5032 Chronic diastolic (congestive) heart failure: Secondary | ICD-10-CM | POA: Diagnosis present

## 2021-09-10 DIAGNOSIS — Z23 Encounter for immunization: Secondary | ICD-10-CM | POA: Diagnosis not present

## 2021-09-10 DIAGNOSIS — E1369 Other specified diabetes mellitus with other specified complication: Secondary | ICD-10-CM

## 2021-09-10 DIAGNOSIS — Z7982 Long term (current) use of aspirin: Secondary | ICD-10-CM

## 2021-09-10 DIAGNOSIS — Z87891 Personal history of nicotine dependence: Secondary | ICD-10-CM

## 2021-09-10 DIAGNOSIS — I70213 Atherosclerosis of native arteries of extremities with intermittent claudication, bilateral legs: Secondary | ICD-10-CM | POA: Diagnosis not present

## 2021-09-10 DIAGNOSIS — E669 Obesity, unspecified: Secondary | ICD-10-CM | POA: Diagnosis not present

## 2021-09-10 DIAGNOSIS — Z6830 Body mass index (BMI) 30.0-30.9, adult: Secondary | ICD-10-CM

## 2021-09-10 DIAGNOSIS — M6281 Muscle weakness (generalized): Secondary | ICD-10-CM | POA: Diagnosis not present

## 2021-09-10 DIAGNOSIS — W19XXXD Unspecified fall, subsequent encounter: Secondary | ICD-10-CM | POA: Diagnosis not present

## 2021-09-10 DIAGNOSIS — Z043 Encounter for examination and observation following other accident: Secondary | ICD-10-CM | POA: Diagnosis not present

## 2021-09-10 DIAGNOSIS — Y92019 Unspecified place in single-family (private) house as the place of occurrence of the external cause: Secondary | ICD-10-CM | POA: Diagnosis not present

## 2021-09-10 DIAGNOSIS — E1151 Type 2 diabetes mellitus with diabetic peripheral angiopathy without gangrene: Principal | ICD-10-CM | POA: Diagnosis present

## 2021-09-10 DIAGNOSIS — E1122 Type 2 diabetes mellitus with diabetic chronic kidney disease: Secondary | ICD-10-CM | POA: Diagnosis not present

## 2021-09-10 DIAGNOSIS — I70222 Atherosclerosis of native arteries of extremities with rest pain, left leg: Secondary | ICD-10-CM | POA: Diagnosis not present

## 2021-09-10 DIAGNOSIS — I2510 Atherosclerotic heart disease of native coronary artery without angina pectoris: Secondary | ICD-10-CM | POA: Diagnosis present

## 2021-09-10 DIAGNOSIS — S0181XA Laceration without foreign body of other part of head, initial encounter: Secondary | ICD-10-CM | POA: Diagnosis not present

## 2021-09-10 DIAGNOSIS — E1165 Type 2 diabetes mellitus with hyperglycemia: Secondary | ICD-10-CM | POA: Diagnosis not present

## 2021-09-10 DIAGNOSIS — E7800 Pure hypercholesterolemia, unspecified: Secondary | ICD-10-CM | POA: Diagnosis present

## 2021-09-10 DIAGNOSIS — I10 Essential (primary) hypertension: Secondary | ICD-10-CM | POA: Diagnosis not present

## 2021-09-10 DIAGNOSIS — I70221 Atherosclerosis of native arteries of extremities with rest pain, right leg: Secondary | ICD-10-CM | POA: Diagnosis present

## 2021-09-10 DIAGNOSIS — I998 Other disorder of circulatory system: Secondary | ICD-10-CM | POA: Diagnosis present

## 2021-09-10 DIAGNOSIS — E785 Hyperlipidemia, unspecified: Secondary | ICD-10-CM

## 2021-09-10 DIAGNOSIS — N1831 Chronic kidney disease, stage 3a: Secondary | ICD-10-CM | POA: Diagnosis not present

## 2021-09-10 DIAGNOSIS — W19XXXA Unspecified fall, initial encounter: Secondary | ICD-10-CM | POA: Diagnosis not present

## 2021-09-10 DIAGNOSIS — E119 Type 2 diabetes mellitus without complications: Secondary | ICD-10-CM

## 2021-09-10 DIAGNOSIS — E1142 Type 2 diabetes mellitus with diabetic polyneuropathy: Secondary | ICD-10-CM | POA: Diagnosis not present

## 2021-09-10 DIAGNOSIS — R52 Pain, unspecified: Secondary | ICD-10-CM | POA: Diagnosis not present

## 2021-09-10 DIAGNOSIS — I130 Hypertensive heart and chronic kidney disease with heart failure and stage 1 through stage 4 chronic kidney disease, or unspecified chronic kidney disease: Secondary | ICD-10-CM | POA: Diagnosis not present

## 2021-09-10 DIAGNOSIS — E876 Hypokalemia: Secondary | ICD-10-CM | POA: Diagnosis present

## 2021-09-10 DIAGNOSIS — Z8249 Family history of ischemic heart disease and other diseases of the circulatory system: Secondary | ICD-10-CM

## 2021-09-10 DIAGNOSIS — S060X1A Concussion with loss of consciousness of 30 minutes or less, initial encounter: Secondary | ICD-10-CM | POA: Diagnosis not present

## 2021-09-10 DIAGNOSIS — E1169 Type 2 diabetes mellitus with other specified complication: Secondary | ICD-10-CM | POA: Diagnosis present

## 2021-09-10 DIAGNOSIS — Z20822 Contact with and (suspected) exposure to covid-19: Secondary | ICD-10-CM | POA: Diagnosis present

## 2021-09-10 DIAGNOSIS — S0101XA Laceration without foreign body of scalp, initial encounter: Secondary | ICD-10-CM | POA: Diagnosis not present

## 2021-09-10 DIAGNOSIS — G8929 Other chronic pain: Secondary | ICD-10-CM | POA: Diagnosis not present

## 2021-09-10 DIAGNOSIS — M79604 Pain in right leg: Secondary | ICD-10-CM | POA: Diagnosis present

## 2021-09-10 DIAGNOSIS — M25572 Pain in left ankle and joints of left foot: Secondary | ICD-10-CM | POA: Diagnosis present

## 2021-09-10 DIAGNOSIS — M79605 Pain in left leg: Secondary | ICD-10-CM | POA: Diagnosis present

## 2021-09-10 DIAGNOSIS — W1830XA Fall on same level, unspecified, initial encounter: Secondary | ICD-10-CM | POA: Diagnosis present

## 2021-09-10 DIAGNOSIS — Z955 Presence of coronary angioplasty implant and graft: Secondary | ICD-10-CM | POA: Diagnosis not present

## 2021-09-10 DIAGNOSIS — Z885 Allergy status to narcotic agent status: Secondary | ICD-10-CM

## 2021-09-10 DIAGNOSIS — E860 Dehydration: Secondary | ICD-10-CM | POA: Diagnosis not present

## 2021-09-10 DIAGNOSIS — M545 Low back pain, unspecified: Secondary | ICD-10-CM | POA: Diagnosis present

## 2021-09-10 DIAGNOSIS — R609 Edema, unspecified: Secondary | ICD-10-CM | POA: Diagnosis not present

## 2021-09-10 DIAGNOSIS — Z79899 Other long term (current) drug therapy: Secondary | ICD-10-CM

## 2021-09-10 DIAGNOSIS — K219 Gastro-esophageal reflux disease without esophagitis: Secondary | ICD-10-CM | POA: Diagnosis present

## 2021-09-10 DIAGNOSIS — E781 Pure hyperglyceridemia: Secondary | ICD-10-CM | POA: Diagnosis not present

## 2021-09-10 DIAGNOSIS — Z7984 Long term (current) use of oral hypoglycemic drugs: Secondary | ICD-10-CM

## 2021-09-10 DIAGNOSIS — Z888 Allergy status to other drugs, medicaments and biological substances status: Secondary | ICD-10-CM

## 2021-09-10 DIAGNOSIS — I152 Hypertension secondary to endocrine disorders: Secondary | ICD-10-CM | POA: Diagnosis present

## 2021-09-10 DIAGNOSIS — M7989 Other specified soft tissue disorders: Secondary | ICD-10-CM | POA: Diagnosis not present

## 2021-09-10 DIAGNOSIS — S060X9A Concussion with loss of consciousness of unspecified duration, initial encounter: Secondary | ICD-10-CM | POA: Diagnosis present

## 2021-09-10 DIAGNOSIS — Z7902 Long term (current) use of antithrombotics/antiplatelets: Secondary | ICD-10-CM

## 2021-09-10 DIAGNOSIS — S79912A Unspecified injury of left hip, initial encounter: Secondary | ICD-10-CM | POA: Diagnosis not present

## 2021-09-10 DIAGNOSIS — Z791 Long term (current) use of non-steroidal anti-inflammatories (NSAID): Secondary | ICD-10-CM

## 2021-09-10 LAB — URINALYSIS, ROUTINE W REFLEX MICROSCOPIC
Bilirubin Urine: NEGATIVE
Glucose, UA: >=500 mg/dL — AB
Hgb urine dipstick: NEGATIVE
Ketones, ur: NEGATIVE mg/dL
Nitrite: NEGATIVE
Protein, ur: 30 mg/dL — AB
Specific Gravity, Urine: 1.024 (ref 1.005–1.030)
WBC, UA: >50 WBC/hpf — ABNORMAL HIGH (ref 0–5)
pH: 5 (ref 5.0–8.0)

## 2021-09-10 LAB — CBC WITH DIFFERENTIAL/PLATELET
Abs Immature Granulocytes: 0.1 10*3/uL — ABNORMAL HIGH (ref 0.00–0.07)
Basophils Absolute: 0.1 10*3/uL (ref 0.0–0.1)
Basophils Relative: 1 %
Eosinophils Absolute: 2 10*3/uL — ABNORMAL HIGH (ref 0.0–0.5)
Eosinophils Relative: 14 %
HCT: 42 % (ref 36.0–46.0)
Hemoglobin: 14.1 g/dL (ref 12.0–15.0)
Immature Granulocytes: 1 %
Lymphocytes Relative: 14 %
Lymphs Abs: 2 10*3/uL (ref 0.7–4.0)
MCH: 31.8 pg (ref 26.0–34.0)
MCHC: 33.6 g/dL (ref 30.0–36.0)
MCV: 94.6 fL (ref 80.0–100.0)
Monocytes Absolute: 0.8 10*3/uL (ref 0.1–1.0)
Monocytes Relative: 6 %
Neutro Abs: 9.6 10*3/uL — ABNORMAL HIGH (ref 1.7–7.7)
Neutrophils Relative %: 64 %
Platelets: 287 10*3/uL (ref 150–400)
RBC: 4.44 MIL/uL (ref 3.87–5.11)
RDW: 13.1 % (ref 11.5–15.5)
WBC: 14.7 10*3/uL — ABNORMAL HIGH (ref 4.0–10.5)
nRBC: 0 % (ref 0.0–0.2)

## 2021-09-10 LAB — COMPREHENSIVE METABOLIC PANEL
ALT: 30 U/L (ref 0–44)
AST: 18 U/L (ref 15–41)
Albumin: 3.9 g/dL (ref 3.5–5.0)
Alkaline Phosphatase: 91 U/L (ref 38–126)
Anion gap: 10 (ref 5–15)
BUN: 33 mg/dL — ABNORMAL HIGH (ref 8–23)
CO2: 27 mmol/L (ref 22–32)
Calcium: 8.8 mg/dL — ABNORMAL LOW (ref 8.9–10.3)
Chloride: 99 mmol/L (ref 98–111)
Creatinine, Ser: 1.4 mg/dL — ABNORMAL HIGH (ref 0.44–1.00)
GFR, Estimated: 41 mL/min — ABNORMAL LOW (ref >60)
Glucose, Bld: 376 mg/dL — ABNORMAL HIGH (ref 70–99)
Potassium: 4.1 mmol/L (ref 3.5–5.1)
Sodium: 136 mmol/L (ref 135–145)
Total Bilirubin: 0.9 mg/dL (ref 0.3–1.2)
Total Protein: 7.1 g/dL (ref 6.5–8.1)

## 2021-09-10 LAB — PROTIME-INR
INR: 0.9 (ref 0.8–1.2)
Prothrombin Time: 12.6 seconds (ref 11.4–15.2)

## 2021-09-10 LAB — CK: Total CK: 40 U/L (ref 38–234)

## 2021-09-10 LAB — RESP PANEL BY RT-PCR (FLU A&B, COVID) ARPGX2
Influenza A by PCR: NEGATIVE
Influenza B by PCR: NEGATIVE
SARS Coronavirus 2 by RT PCR: NEGATIVE

## 2021-09-10 LAB — CBG MONITORING, ED: Glucose-Capillary: 290 mg/dL — ABNORMAL HIGH (ref 70–99)

## 2021-09-10 IMAGING — DX DG HIP (WITH OR WITHOUT PELVIS) 2-3V*L*
3 series · 3 of 3 positions shown · non-contrast
Comparison: [DATE]

CLINICAL DATA: Fell at home today. Patient was found down. Tripped
on her LEFT leg.

EXAM:
DG HIP (WITH OR WITHOUT PELVIS) 2-3V LEFT

[pelvis ap]
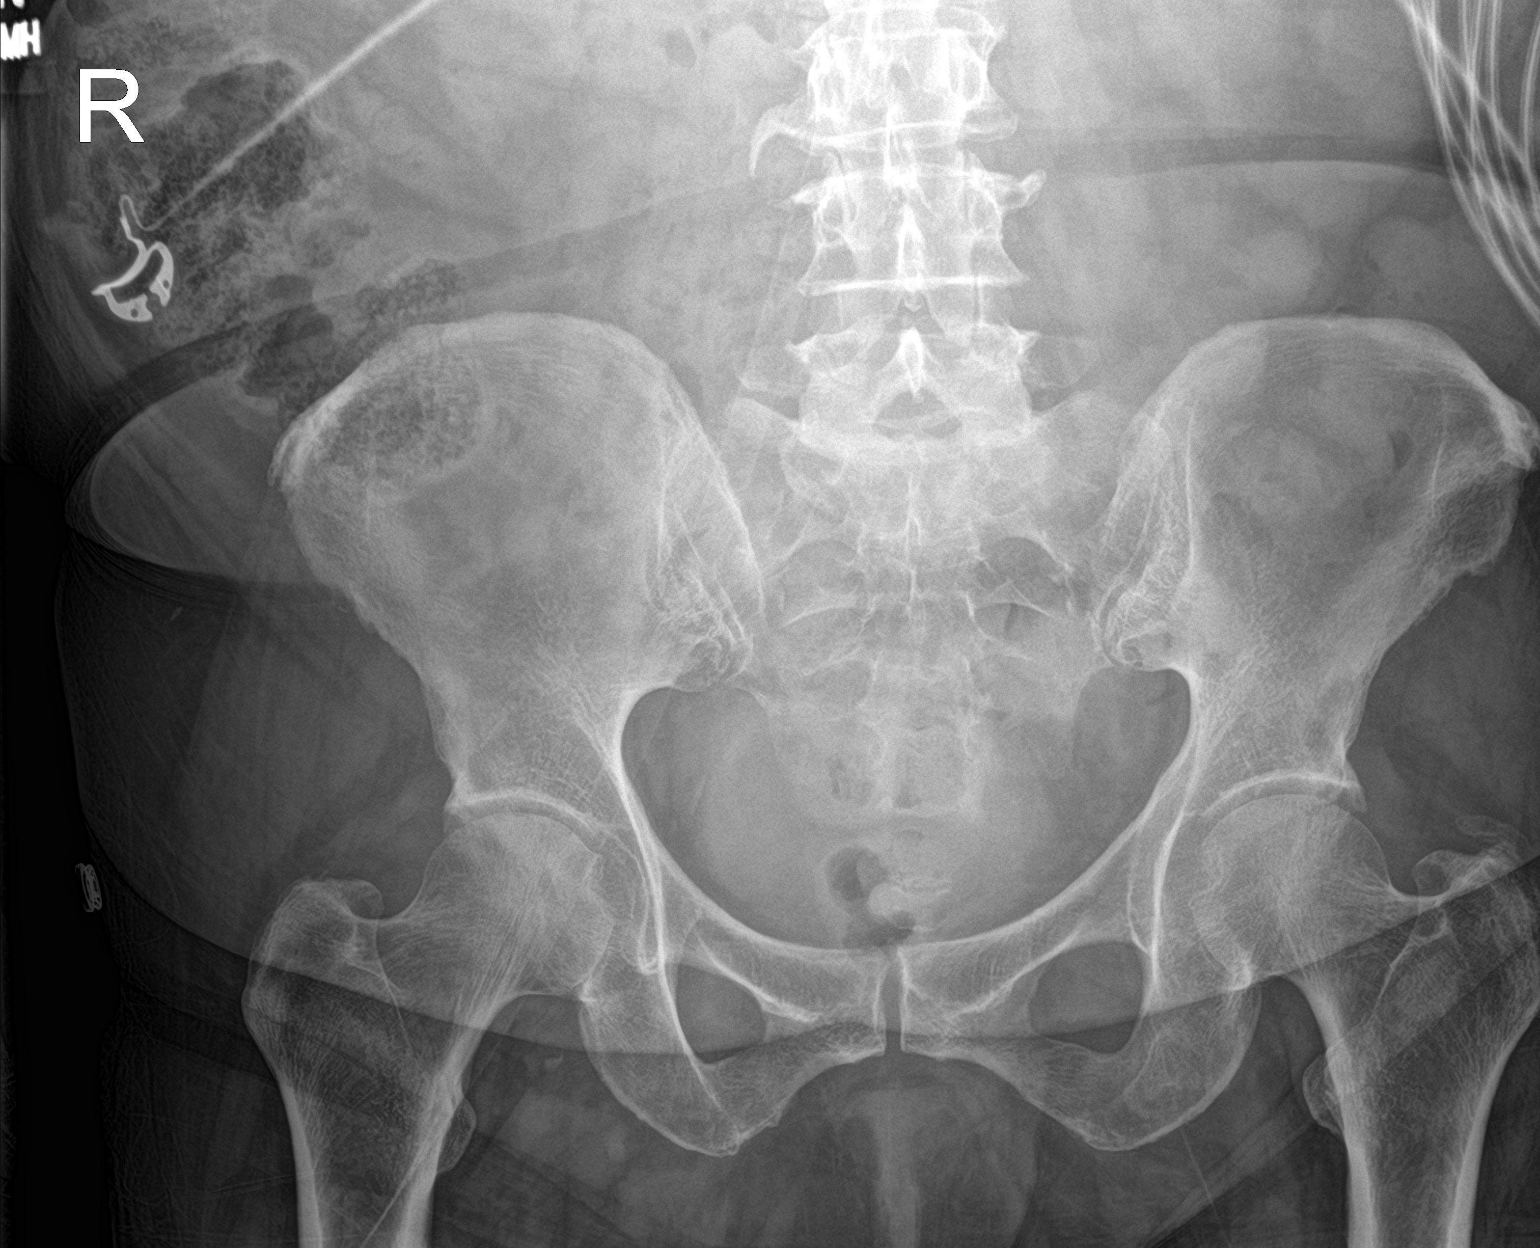

[hip ap]
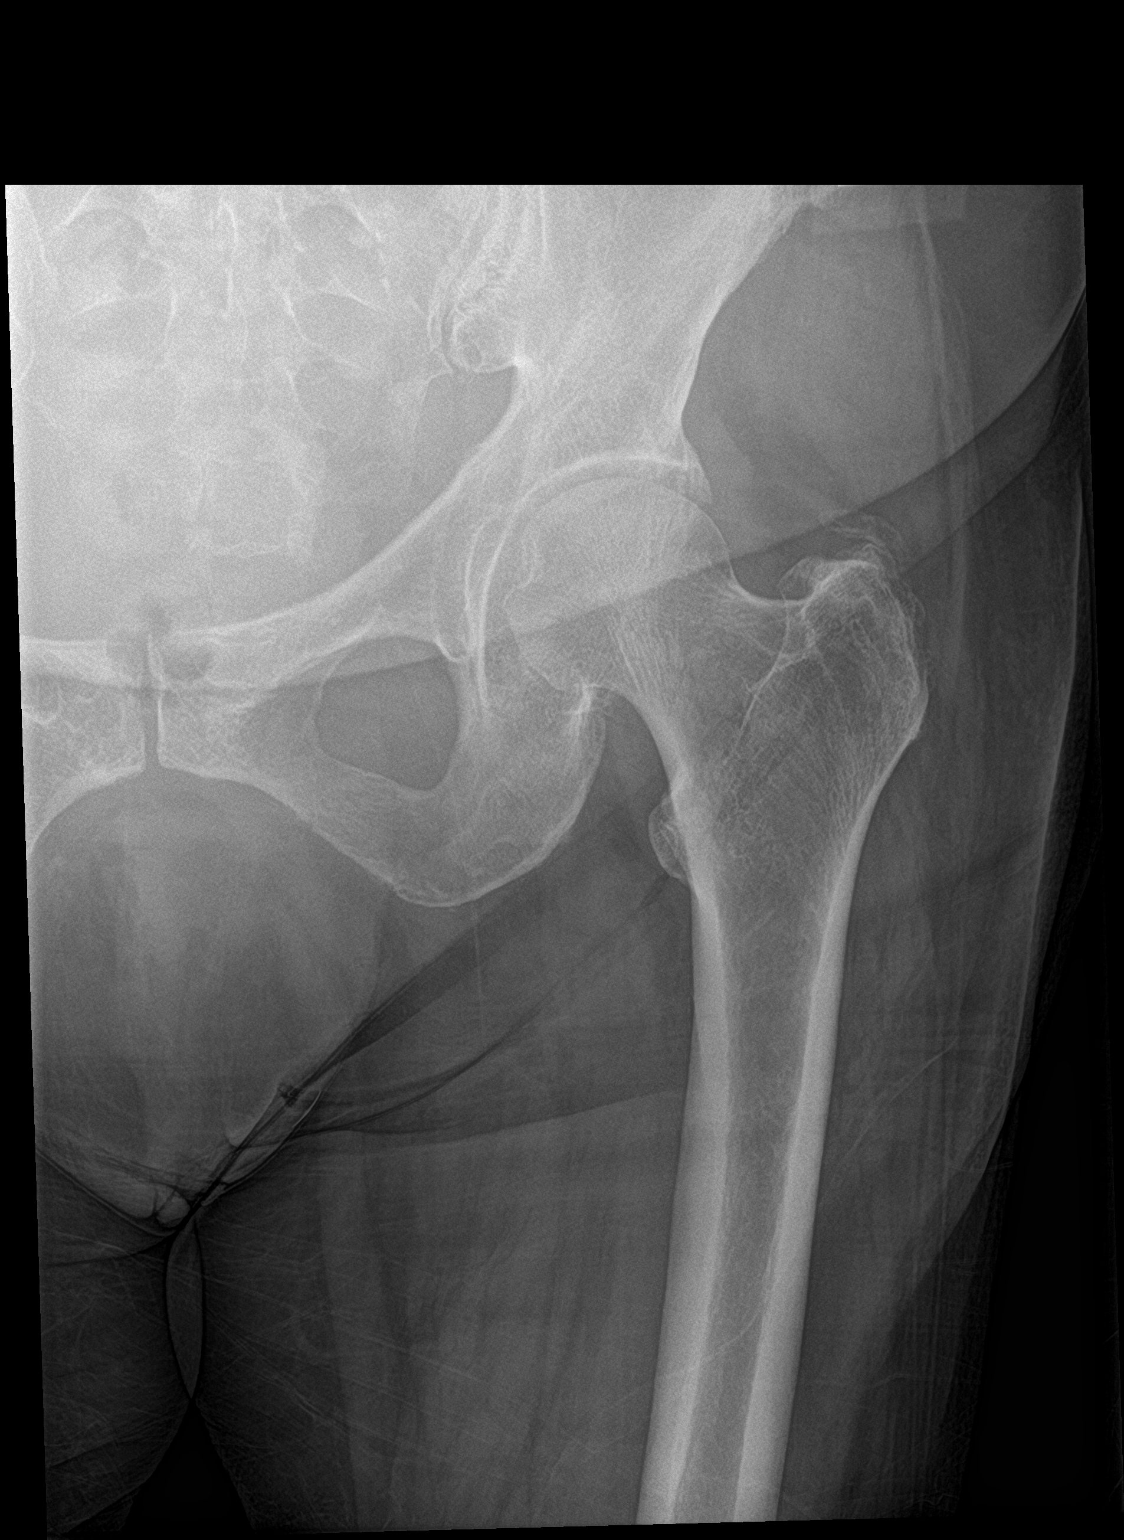

[hip frog leg]
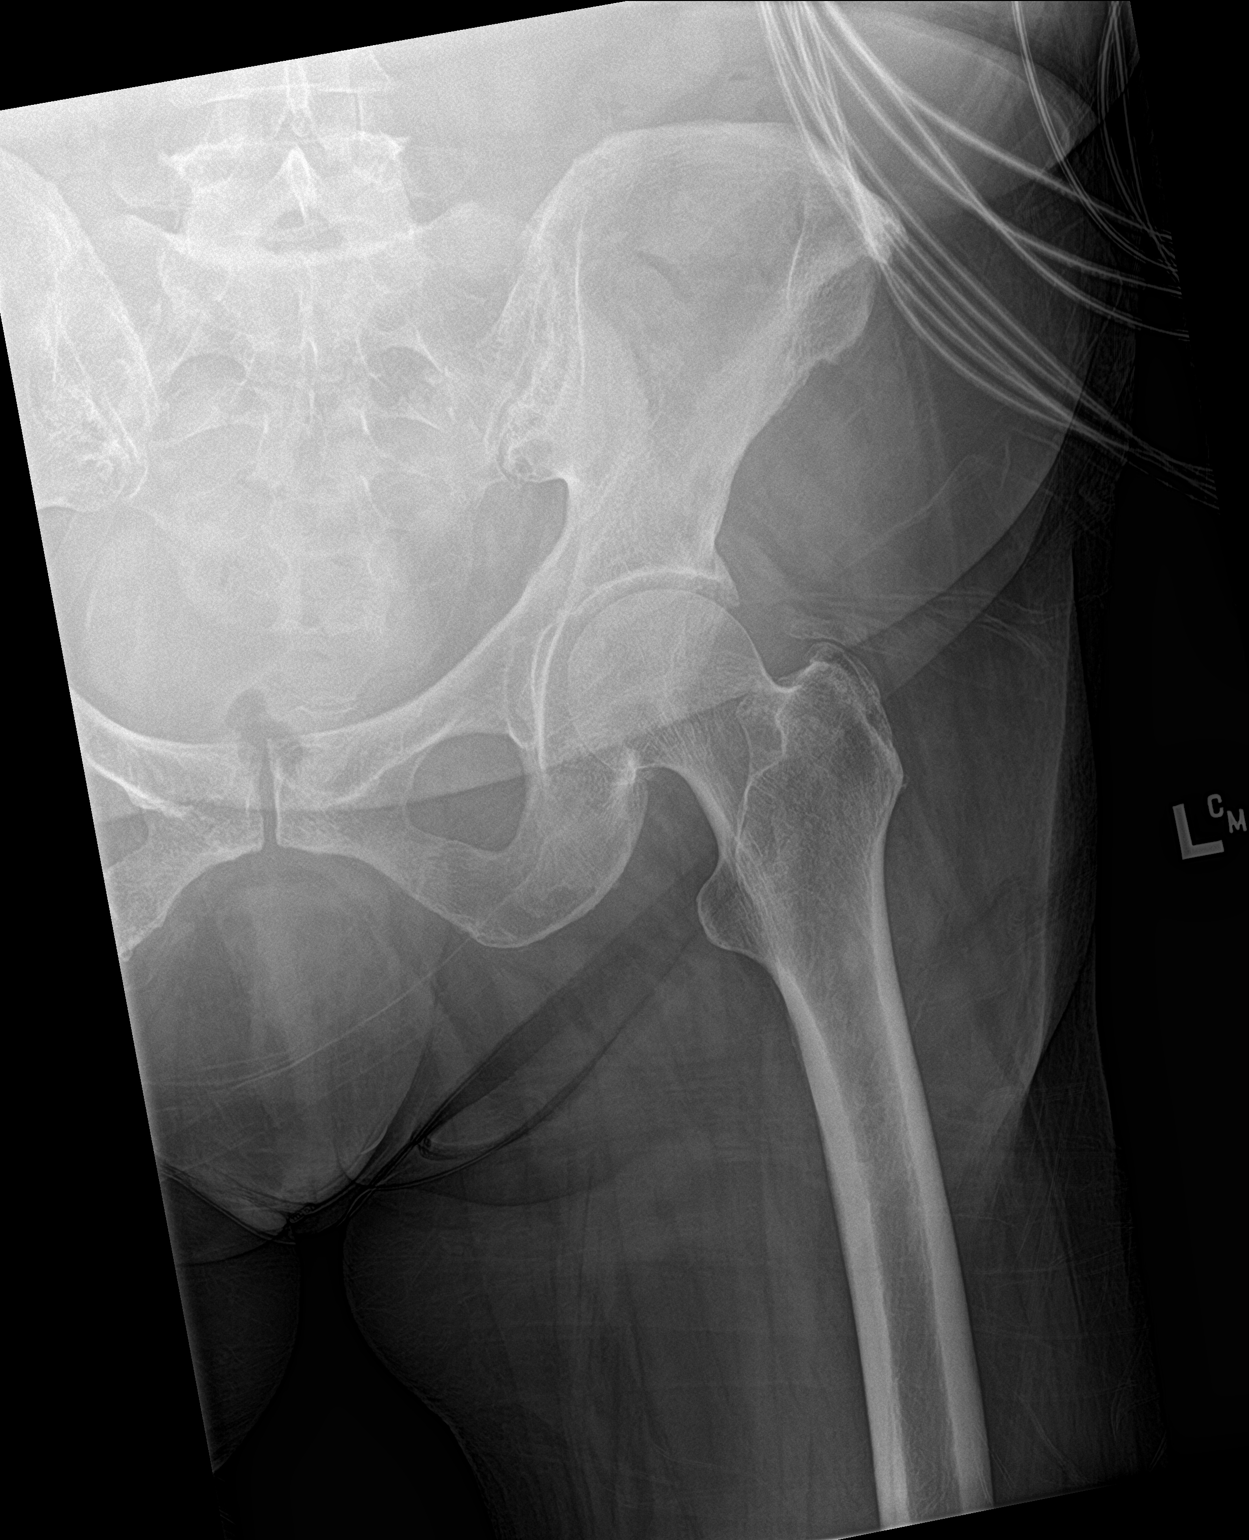

[3 of 3 positions shown; findings below may reference images not displayed]

FINDINGS: There is no evidence of hip fracture or dislocation. There is no
evidence of arthropathy or other focal bone abnormality.
IMPRESSION: Negative.

## 2021-09-10 IMAGING — DX DG HAND COMPLETE 3+V*L*
3 series · 3 of 3 positions shown · non-contrast
Comparison: None.

CLINICAL DATA: Fall.

EXAM:
LEFT WRIST - COMPLETE 3+ VIEW; LEFT HAND - COMPLETE 3+ VIEW

[hand pa]
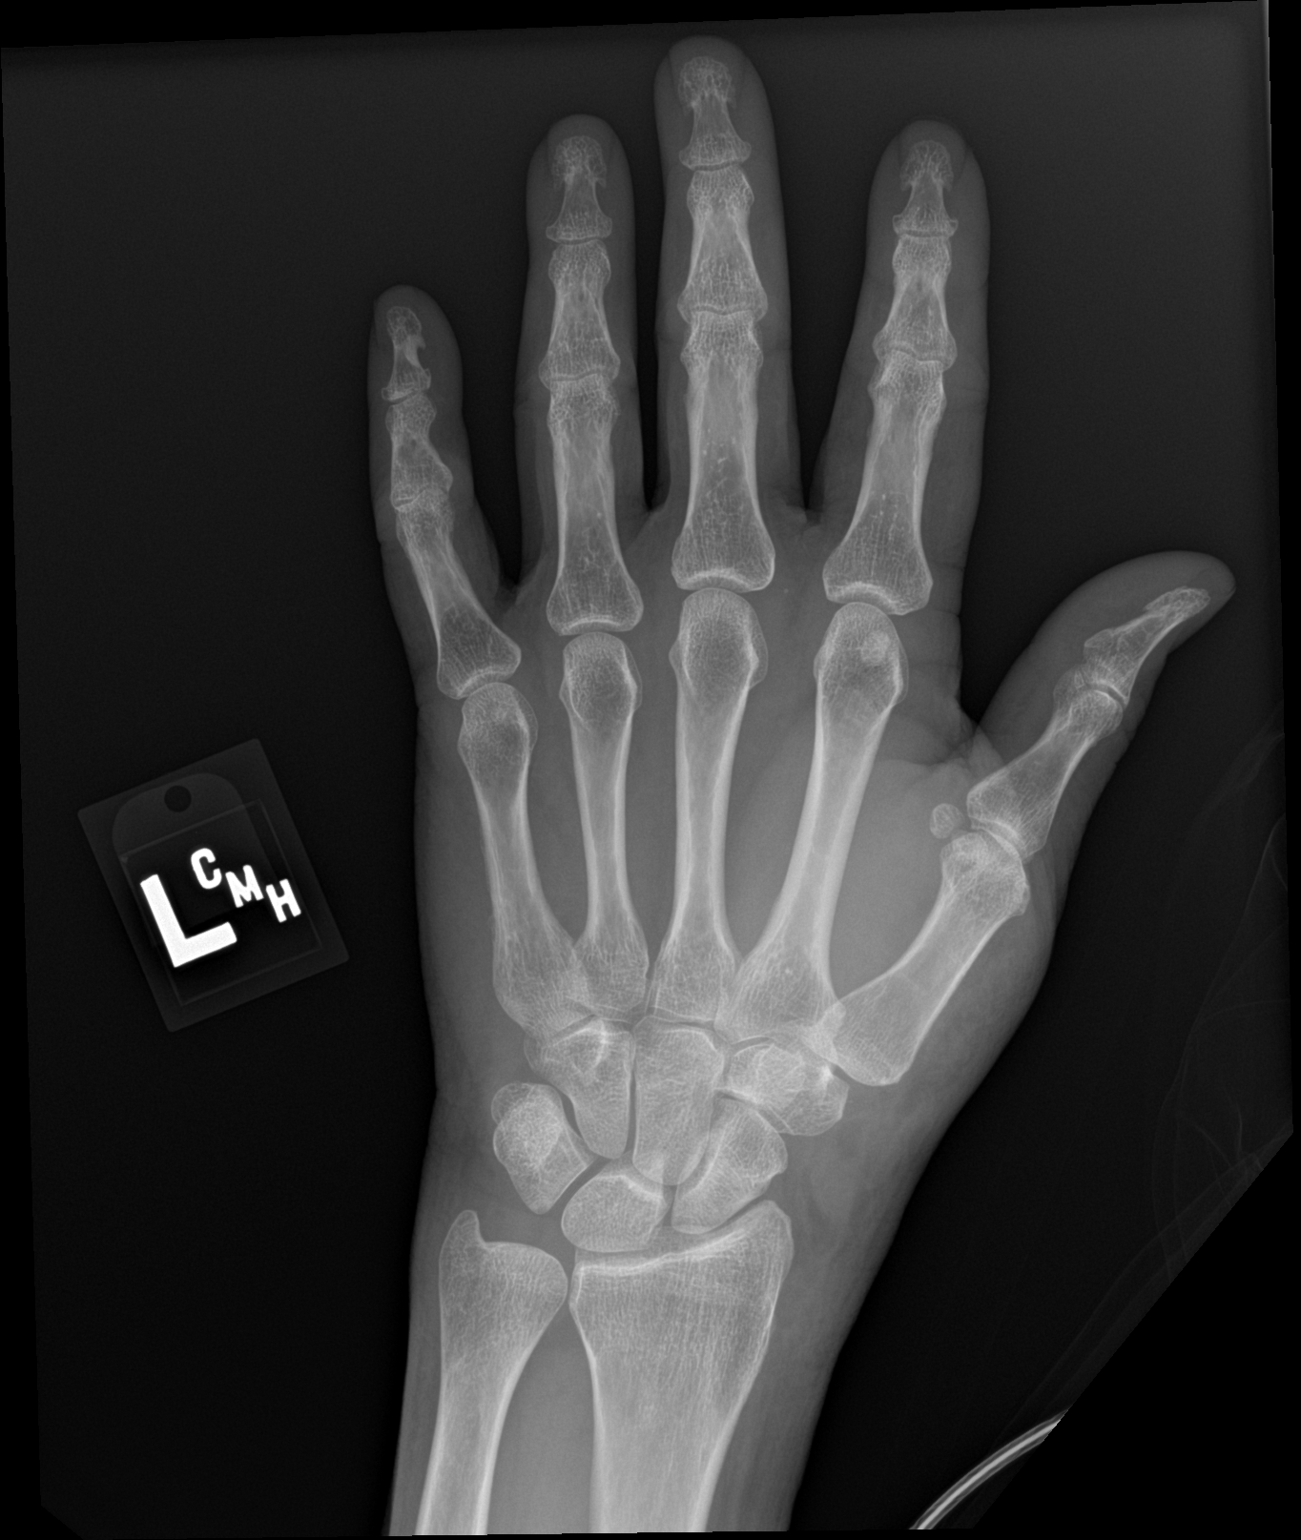

[hand obl]
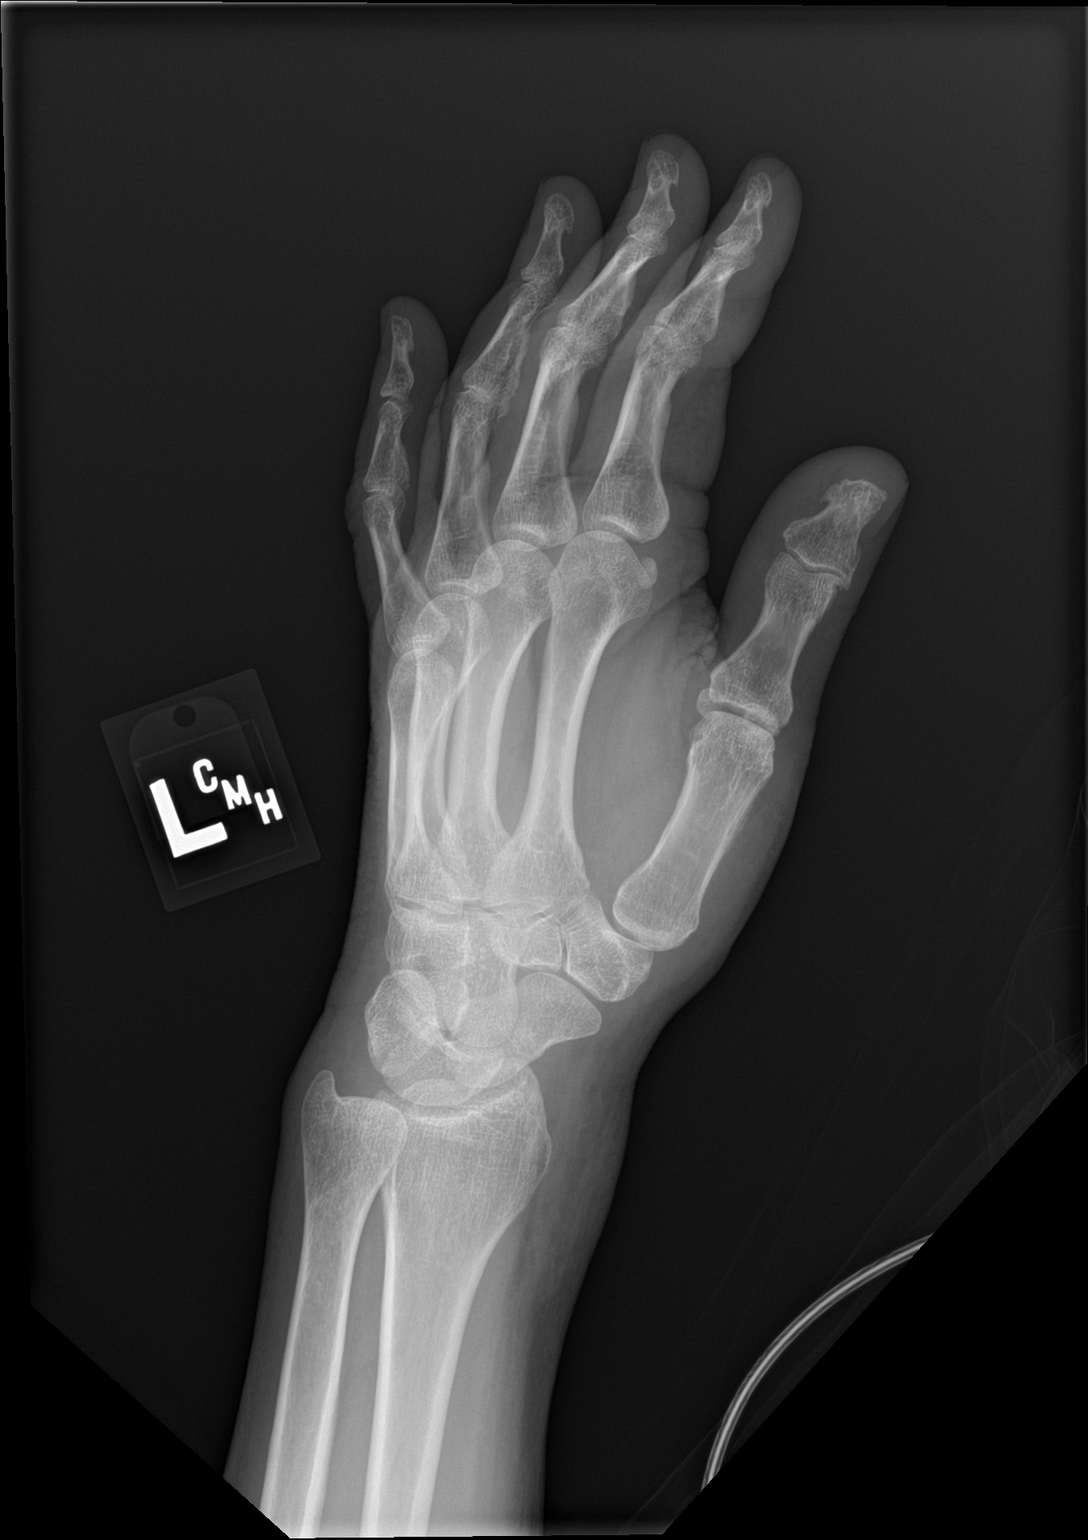

[hand lat]
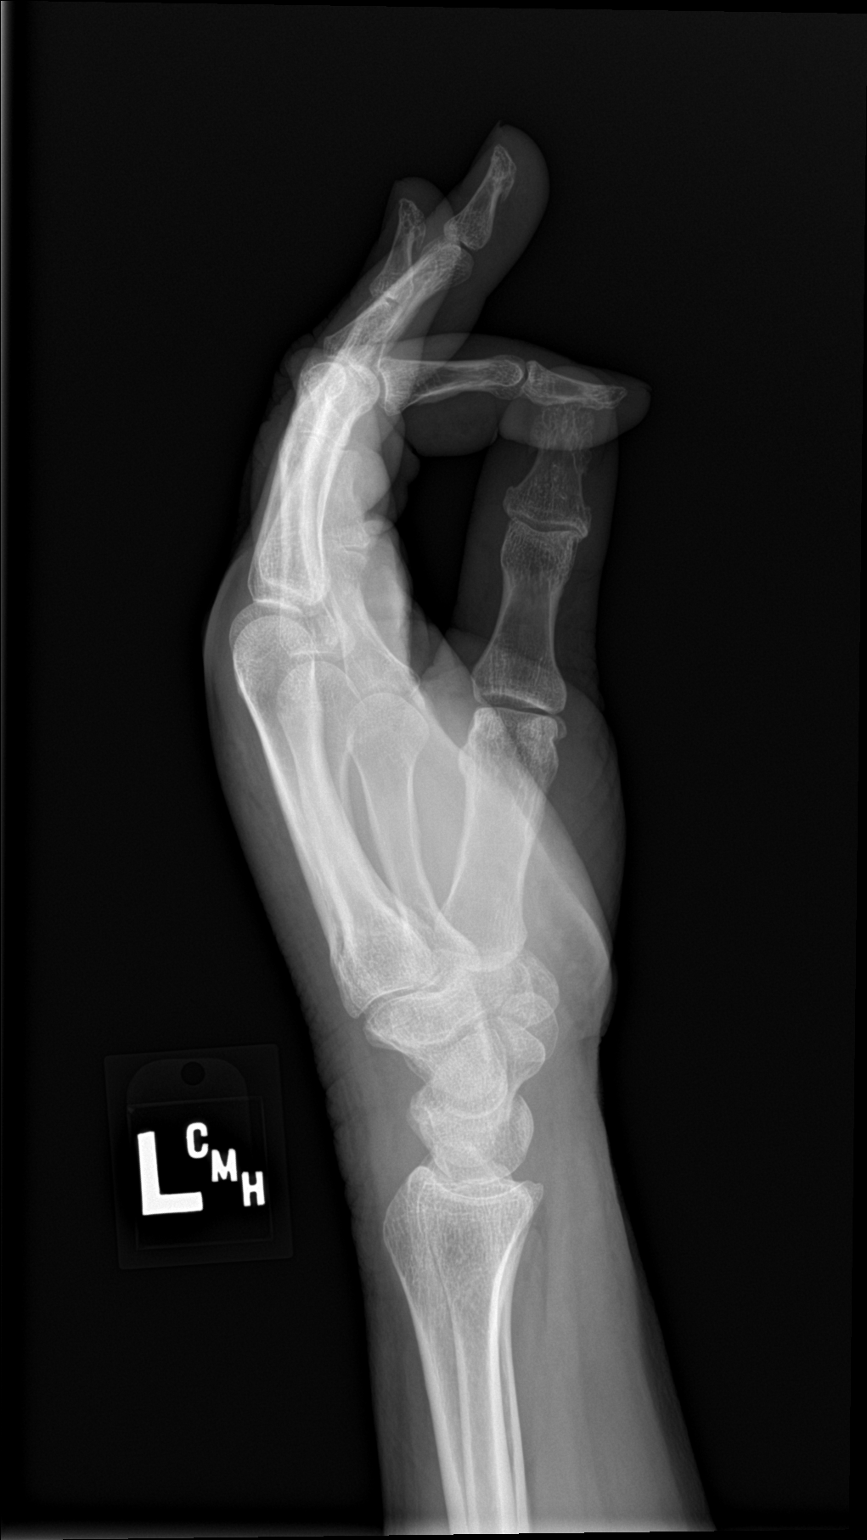

[3 of 3 positions shown; findings below may reference images not displayed]

FINDINGS: No acute fracture or dislocation. Joint spaces are preserved. Bone
mineralization is normal. Soft tissue swelling along the radial
aspect of the wrist and thumb.
IMPRESSION: 1. Soft tissue swelling. No acute osseous abnormality.

## 2021-09-10 IMAGING — CT CT CERVICAL SPINE W/O CM
3 of 4 series · 12 of 33 positions shown, 14 images · non-contrast
Comparison: None.

CLINICAL DATA: Patient status post fall.

EXAM:
CT HEAD WITHOUT CONTRAST
CT MAXILLOFACIAL WITHOUT CONTRAST
CT CERVICAL SPINE WITHOUT CONTRAST
TECHNIQUE: Multidetector CT imaging of the head, cervical spine, and
maxillofacial structures were performed using the standard protocol
without intravenous contrast. Multiplanar CT image reconstructions
of the cervical spine and maxillofacial structures were also
generated.

[Series 4: sag bone · sagittal · 0.27mm/px · 5 of 61 slices shown, 6 images]
[im 21/61  bone]
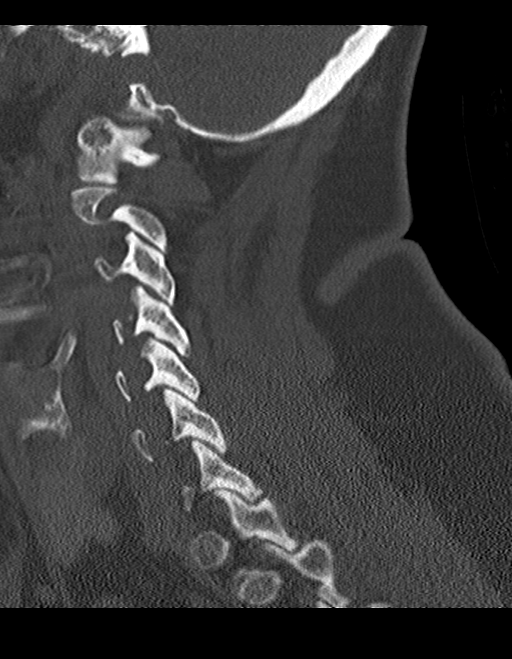
[im 26/61  bone]
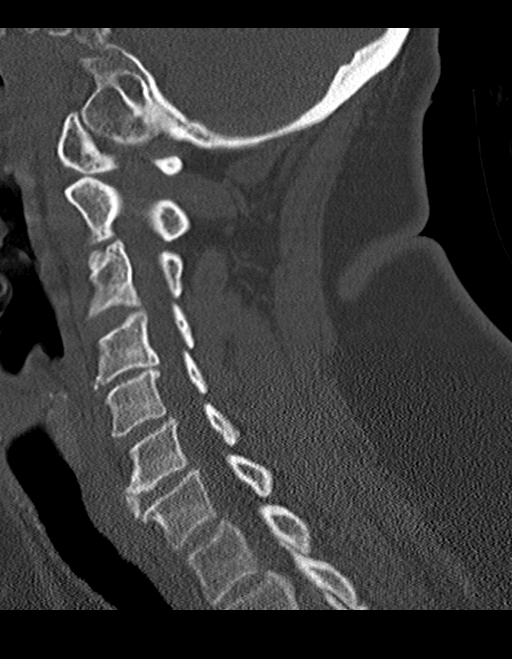
[im 31/61  soft-tissue]
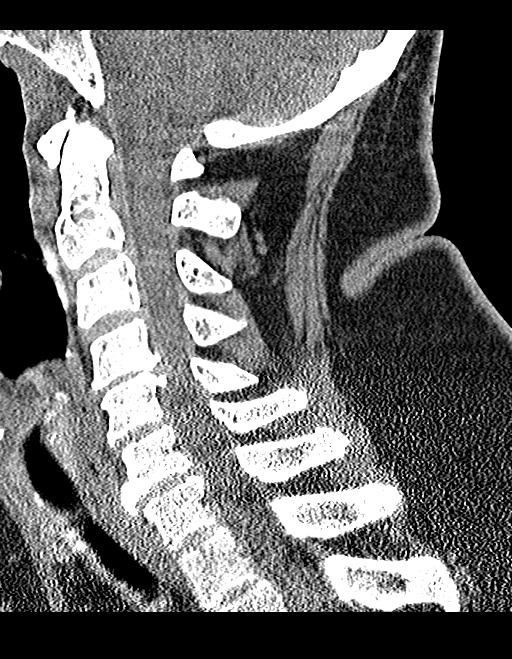
[im 31/61  bone]
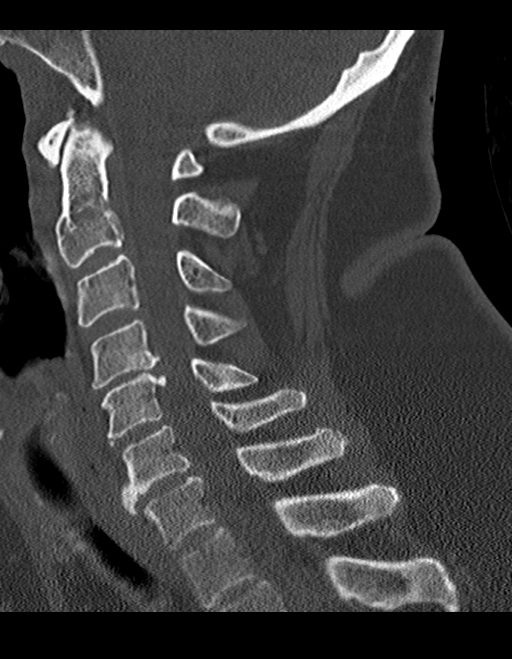
[im 36/61  bone]
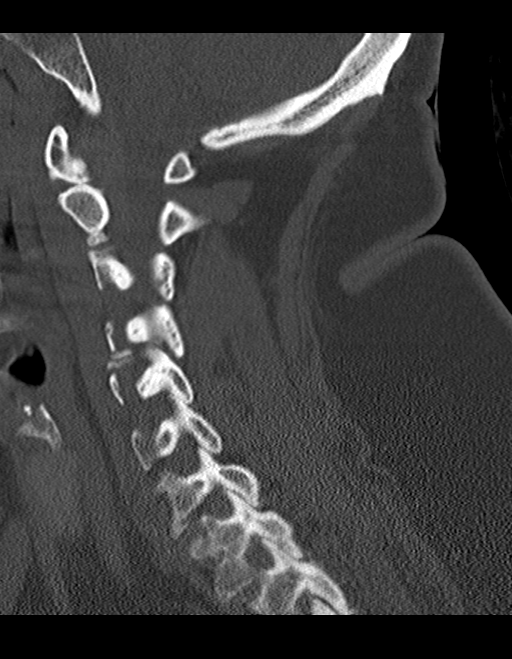
[im 41/61  bone]
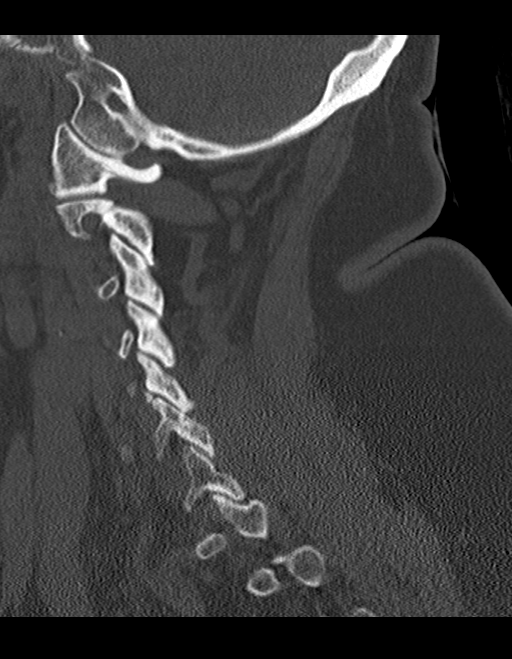

[Series 5: cor bone · coronal · 0.20mm/px · 3 of 61 slices shown]
[im 13/61  bone]
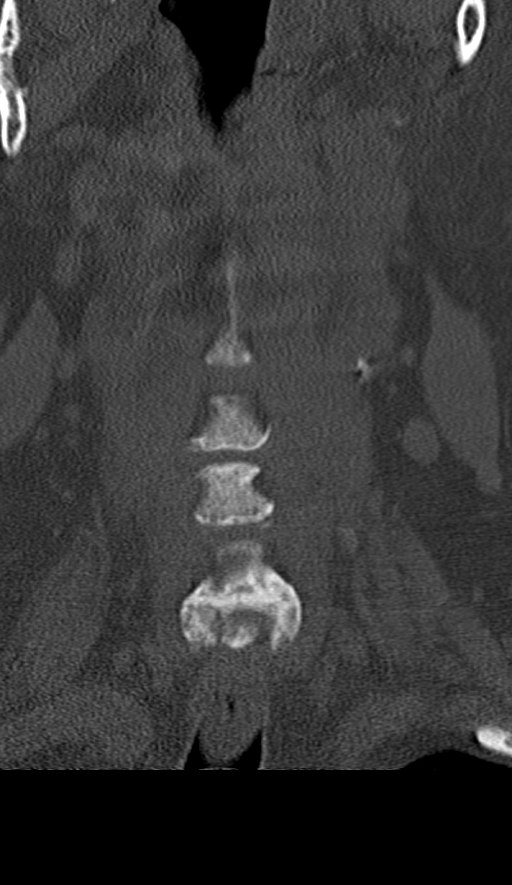
[im 25/61  bone]
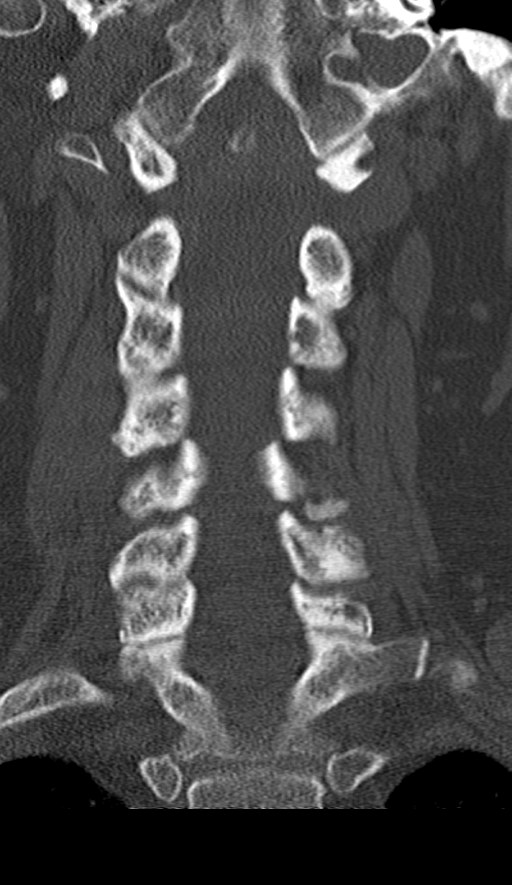
[im 37/61  bone]
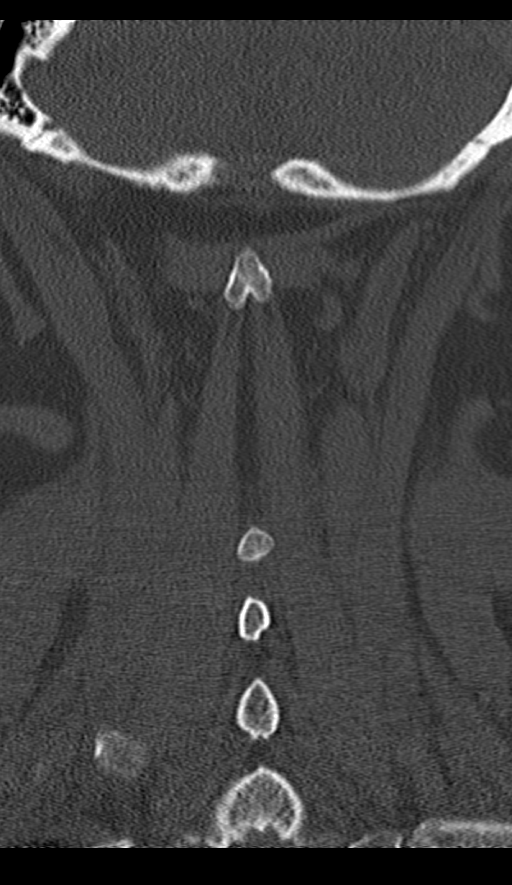

[Series 6: orthogonal axials · axial · 0.21mm/px · z∈[-251,-156]mm · 4 of 79 slices shown, 5 images]
[im 14/79  soft-tissue]
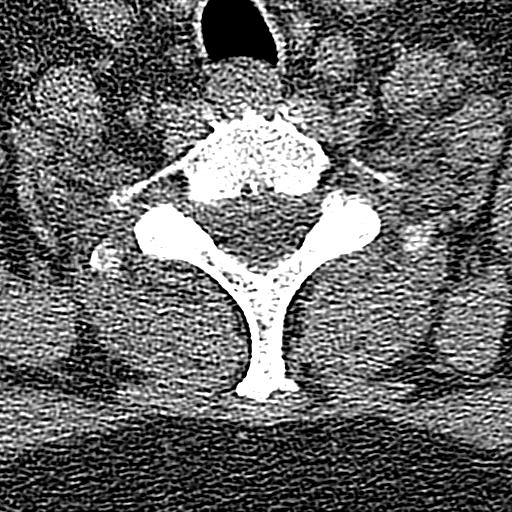
[im 14/79  bone]
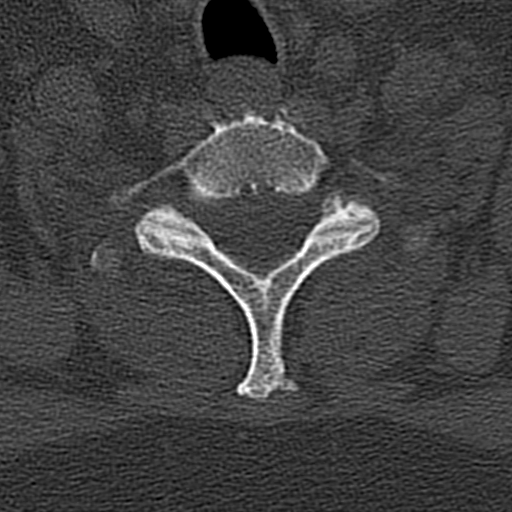
[im 27/79  bone]
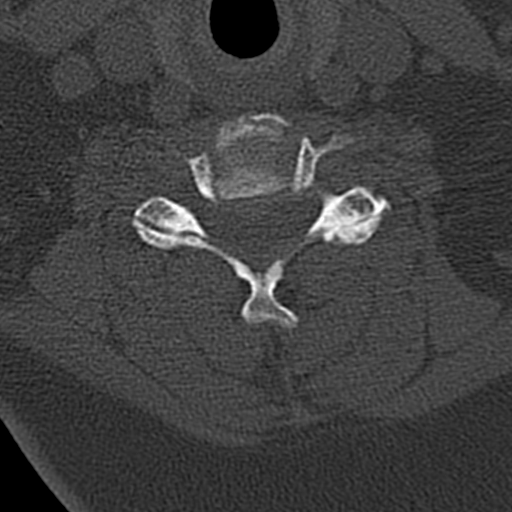
[im 53/79  bone]
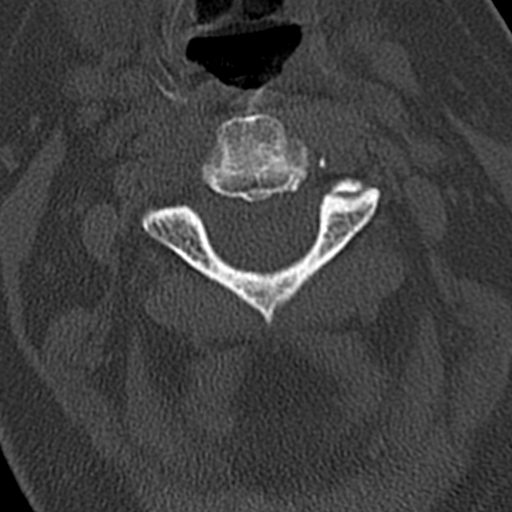
[im 66/79  bone]
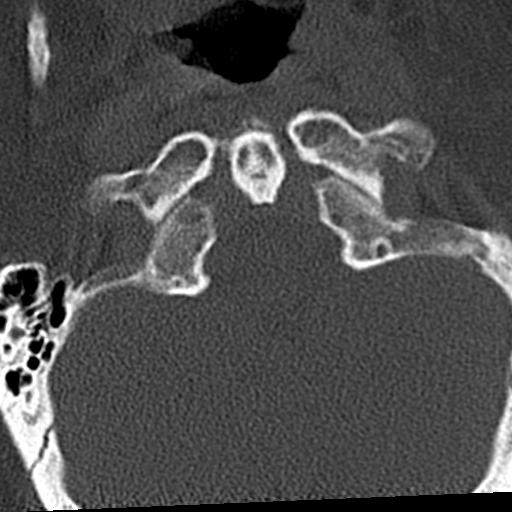

[12 of 33 positions shown; findings below may reference images not displayed]

FINDINGS: CT HEAD FINDINGS

Brain: Ventricles and sulci are appropriate for patient's age. No
evidence for acute cortically based infarct, intracranial
hemorrhage, mass lesion or mass-effect.

Vascular: Unremarkable.

Skull: Intact.

Other: None.

CT MAXILLOFACIAL FINDINGS

Osseous: No fracture or mandibular dislocation. No destructive
process.

Orbits: Unremarkable.

Sinuses: Unremarkable.

Soft tissues: Negative.

CT CERVICAL SPINE FINDINGS

Alignment: Normal anatomic alignment.

Skull base and vertebrae: Intact.

Soft tissues and spinal canal: No prevertebral fluid or swelling. No
visible canal hematoma.

Disc levels: Degenerative disc disease most pronounced C4-5 with
posterior disc osteophyte complex narrowing the canal. No acute
fracture.

Upper chest: Unremarkable.

Other: None.
IMPRESSION: No acute intracranial process. No maxillofacial fracture. No acute
cervical spine fracture.

## 2021-09-10 IMAGING — CT CT HEAD W/O CM
3 series · 15 of 47 positions shown, 18 images · non-contrast
Comparison: None.

CLINICAL DATA: Patient status post fall.

EXAM:
CT HEAD WITHOUT CONTRAST
CT MAXILLOFACIAL WITHOUT CONTRAST
CT CERVICAL SPINE WITHOUT CONTRAST
TECHNIQUE: Multidetector CT imaging of the head, cervical spine, and
maxillofacial structures were performed using the standard protocol
without intravenous contrast. Multiplanar CT image reconstructions
of the cervical spine and maxillofacial structures were also
generated.

[Series 2: head w o · axial · 0.38mm/px · z∈[-114,+11]mm · 9 of 31 slices shown, 12 images]
[im 3/31  brain]
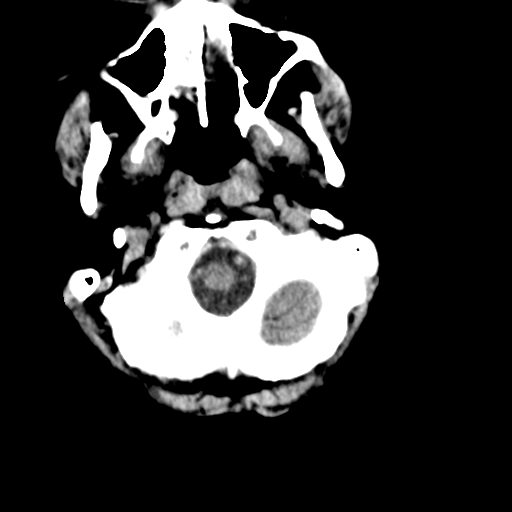
[im 3/31  bone]
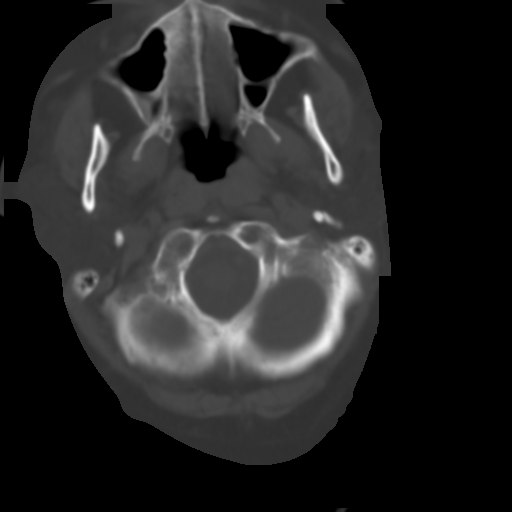
[im 6/31  brain]
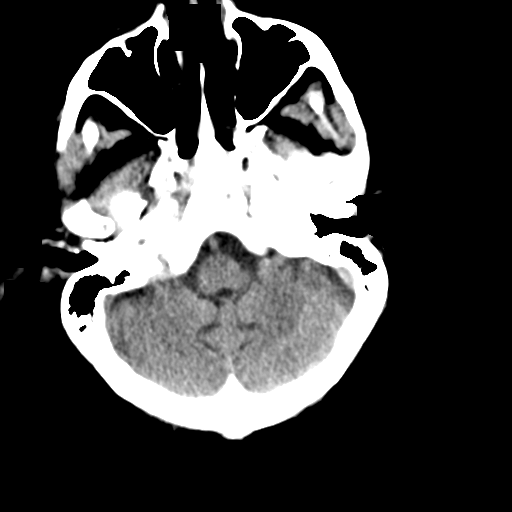
[im 9/31  brain]
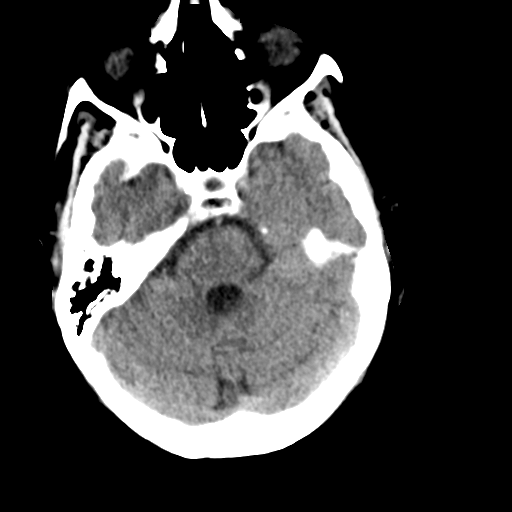
[im 12/31  brain]
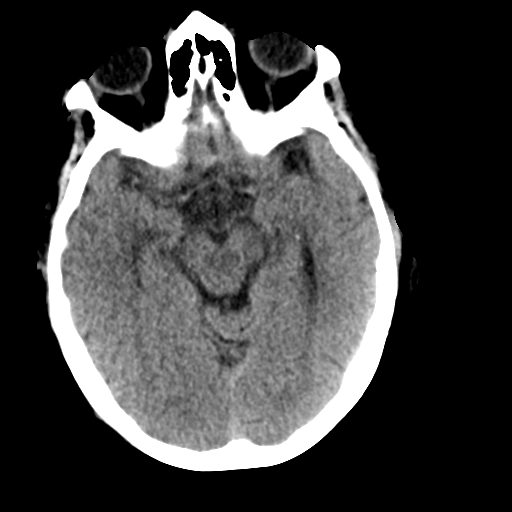
[im 16/31  brain]
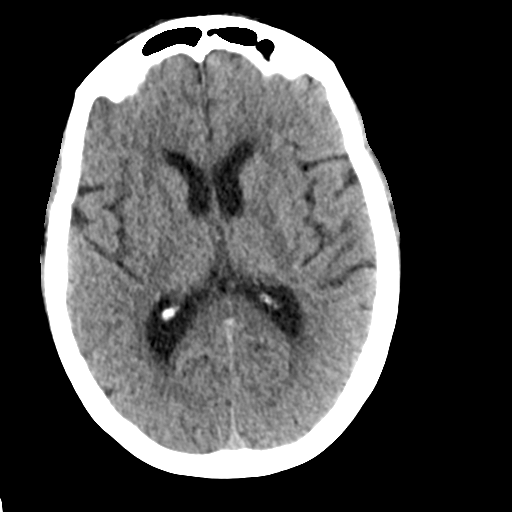
[im 16/31  bone]
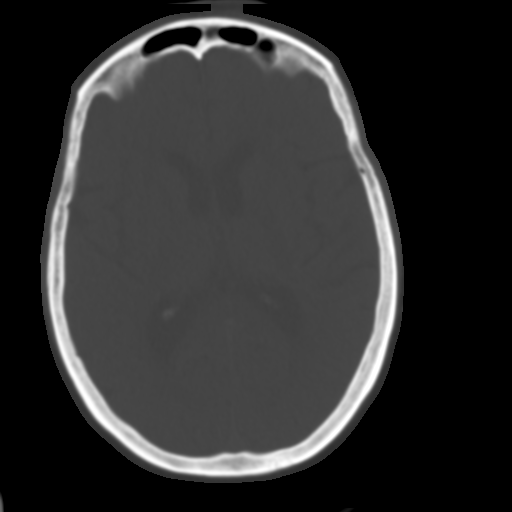
[im 19/31  brain]
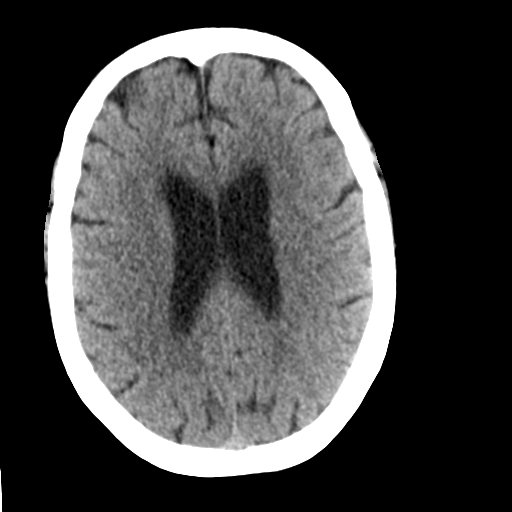
[im 22/31  brain]
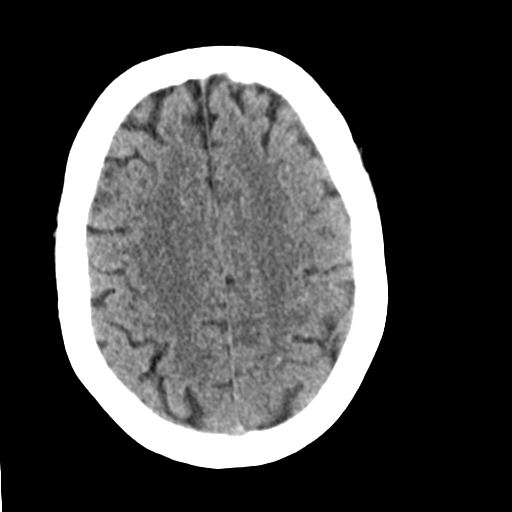
[im 25/31  brain]
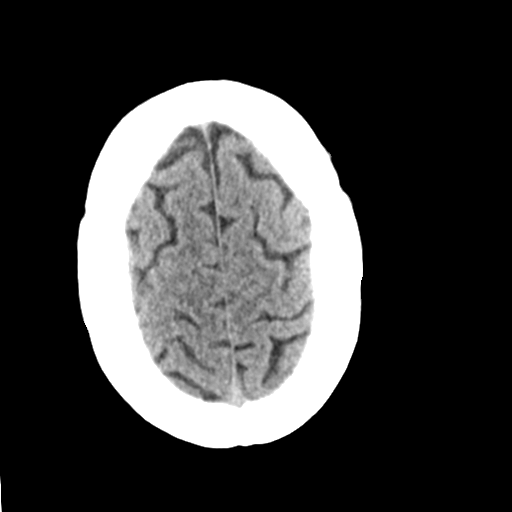
[im 28/31  brain]
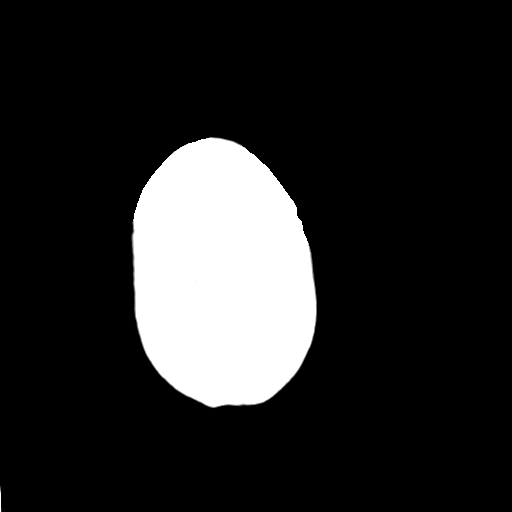
[im 28/31  bone]
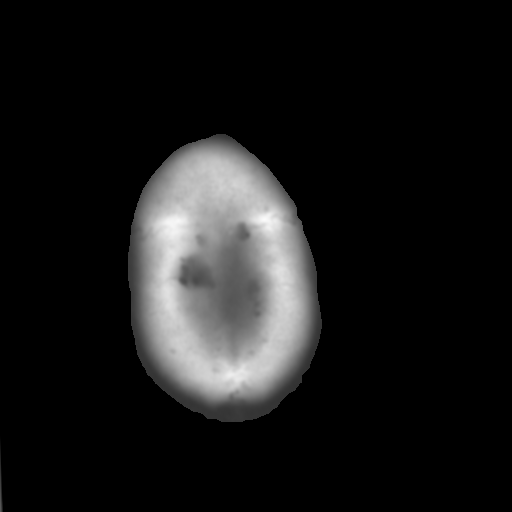

[Series 4: coronal soft · coronal · 0.31mm/px · 3 of 67 slices shown]
[im 23/67  brain]
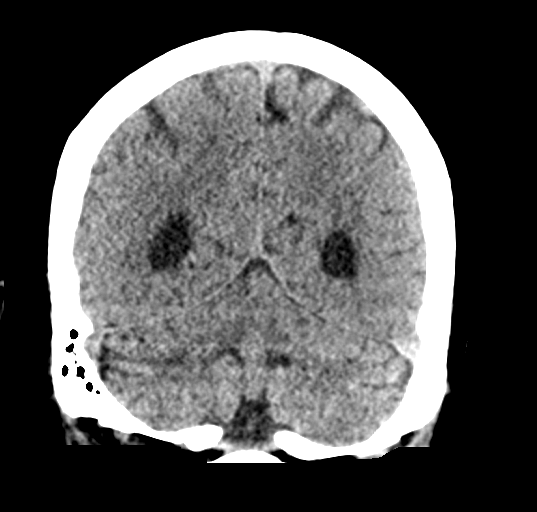
[im 30/67  brain]
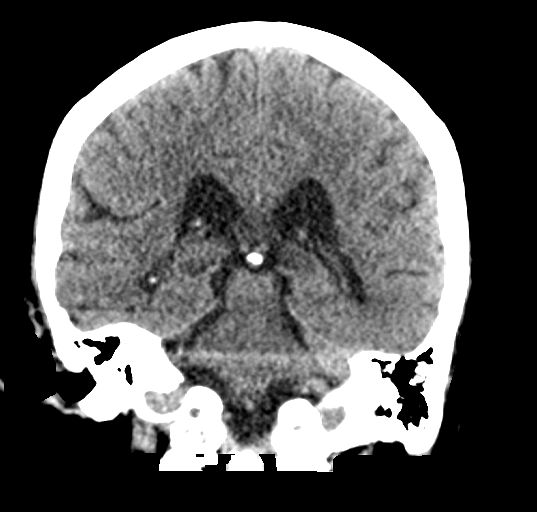
[im 37/67  brain]
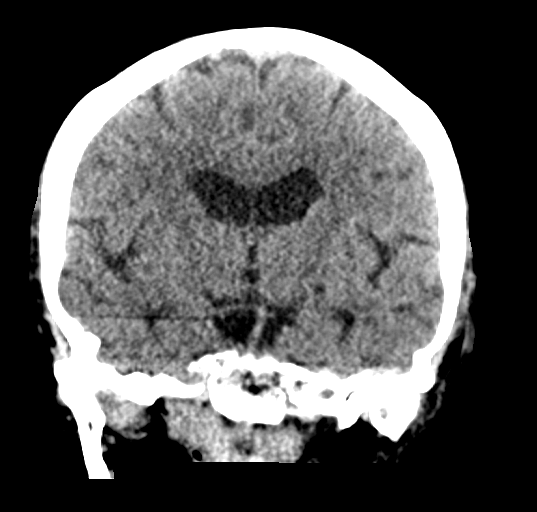

[Series 5: sagittal soft · sagittal · 0.32mm/px · 3 of 56 slices shown]
[im 19/56  brain]
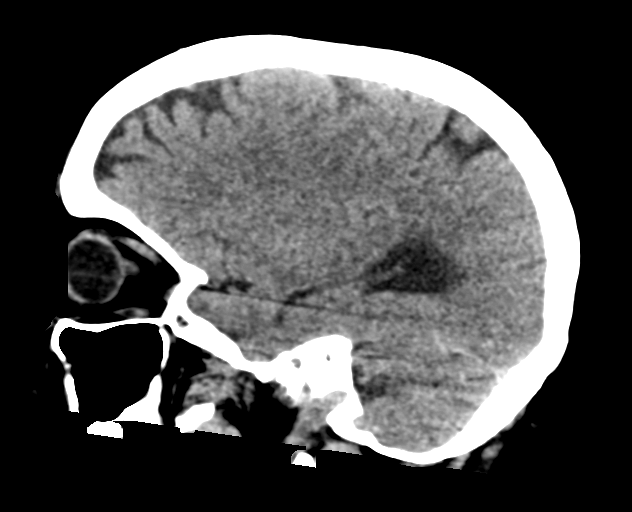
[im 28/56  brain]
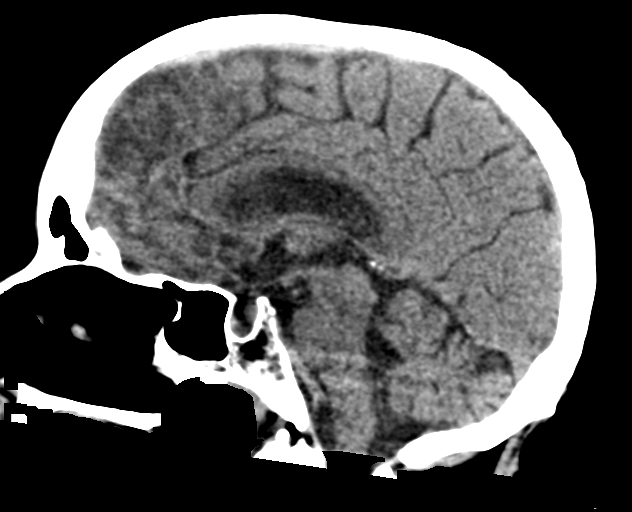
[im 37/56  brain]
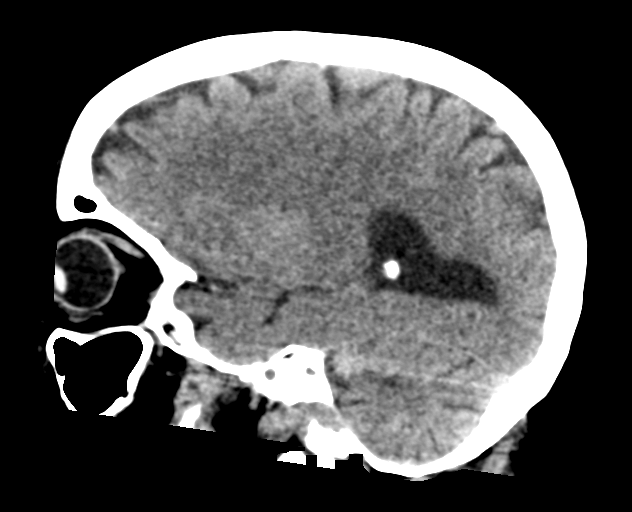

[15 of 47 positions shown; findings below may reference images not displayed]

FINDINGS: CT HEAD FINDINGS

Brain: Ventricles and sulci are appropriate for patient's age. No
evidence for acute cortically based infarct, intracranial
hemorrhage, mass lesion or mass-effect.

Vascular: Unremarkable.

Skull: Intact.

Other: None.

CT MAXILLOFACIAL FINDINGS

Osseous: No fracture or mandibular dislocation. No destructive
process.

Orbits: Unremarkable.

Sinuses: Unremarkable.

Soft tissues: Negative.

CT CERVICAL SPINE FINDINGS

Alignment: Normal anatomic alignment.

Skull base and vertebrae: Intact.

Soft tissues and spinal canal: No prevertebral fluid or swelling. No
visible canal hematoma.

Disc levels: Degenerative disc disease most pronounced C4-5 with
posterior disc osteophyte complex narrowing the canal. No acute
fracture.

Upper chest: Unremarkable.

Other: None.
IMPRESSION: No acute intracranial process. No maxillofacial fracture. No acute
cervical spine fracture.

## 2021-09-10 IMAGING — CT CT MAXILLOFACIAL W/O CM
3 of 5 series · 16 of 47 positions shown, 19 images · non-contrast
Comparison: None.

CLINICAL DATA: Patient status post fall.

EXAM:
CT HEAD WITHOUT CONTRAST
CT MAXILLOFACIAL WITHOUT CONTRAST
CT CERVICAL SPINE WITHOUT CONTRAST
TECHNIQUE: Multidetector CT imaging of the head, cervical spine, and
maxillofacial structures were performed using the standard protocol
without intravenous contrast. Multiplanar CT image reconstructions
of the cervical spine and maxillofacial structures were also
generated.

[Series 7: coronal soft · coronal · 0.34mm/px · 3 of 74 slices shown]
[im 25/74  bone]
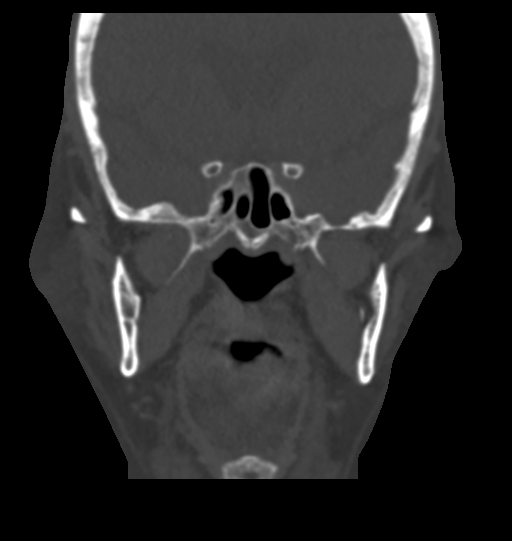
[im 33/74  bone]
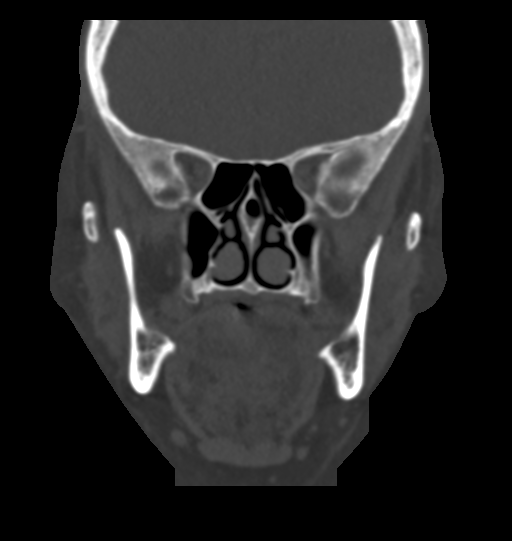
[im 41/74  bone]
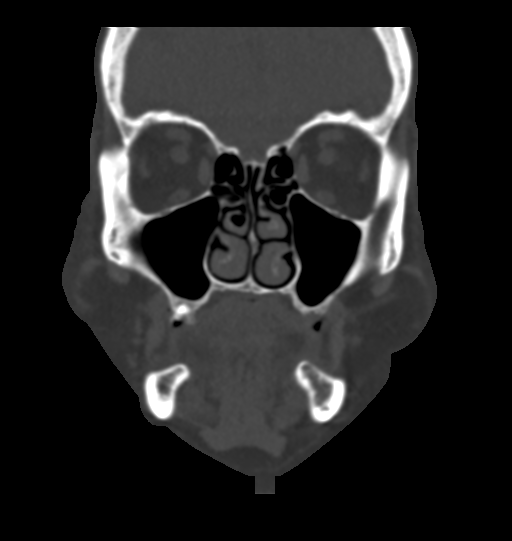

[Series 10: sagittal bone · sagittal · 0.33mm/px · 2 of 87 slices shown]
[im 29/87  bone]
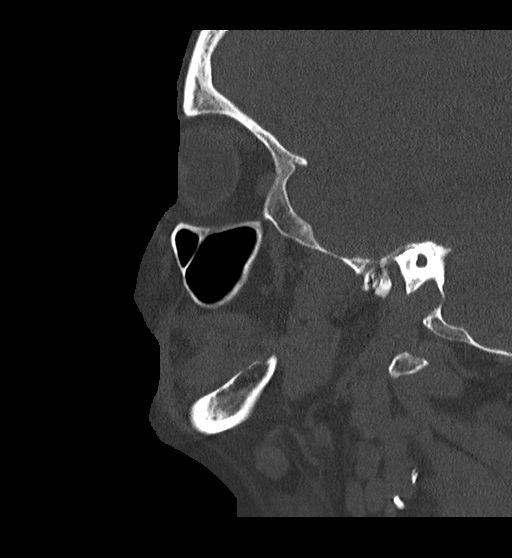
[im 58/87  bone]
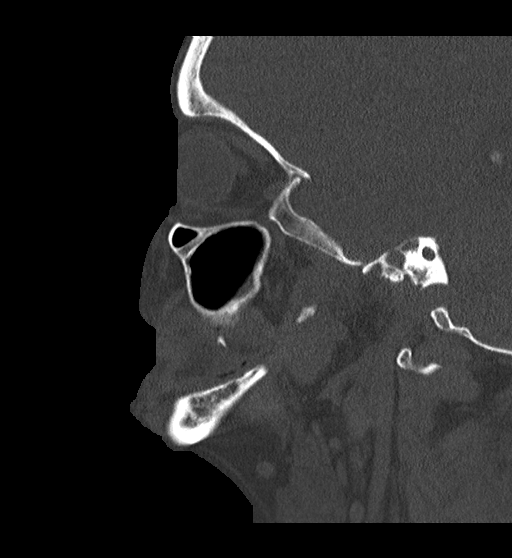

[Series 15: orthogonal axials · axial · 0.21mm/px · z∈[-266,-143]mm · 11 of 79 slices shown, 14 images]
[im 6/79  brain]
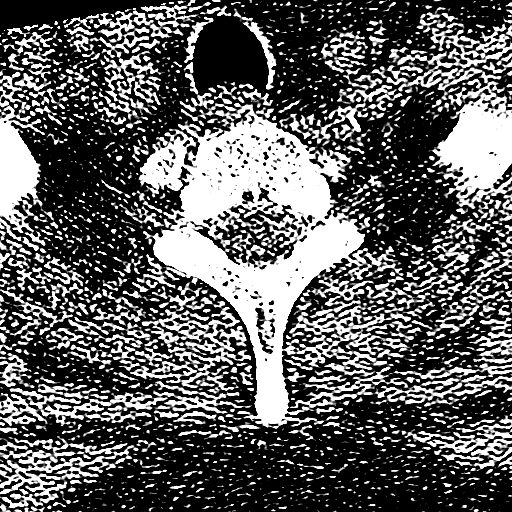
[im 6/79  bone]
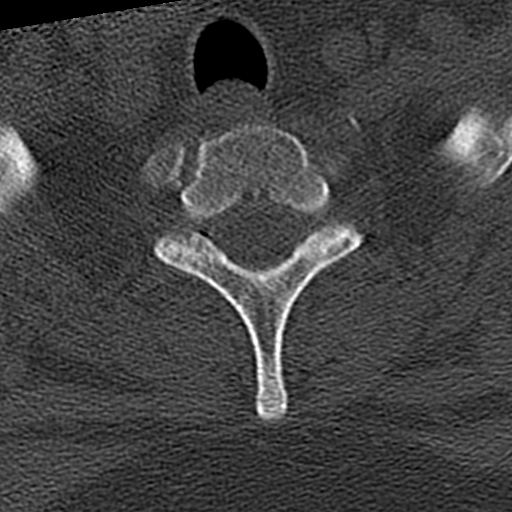
[im 12/79  bone]
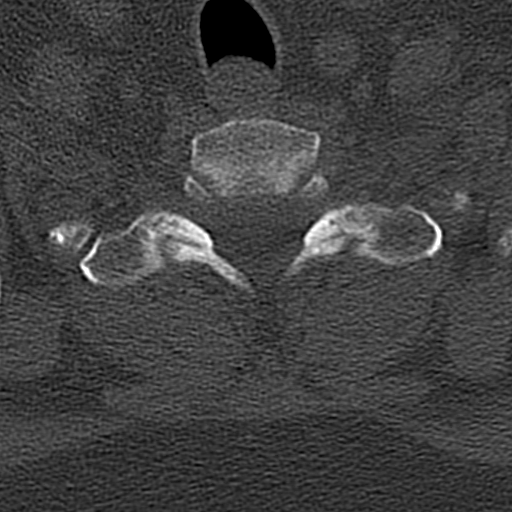
[im 17/79  bone]
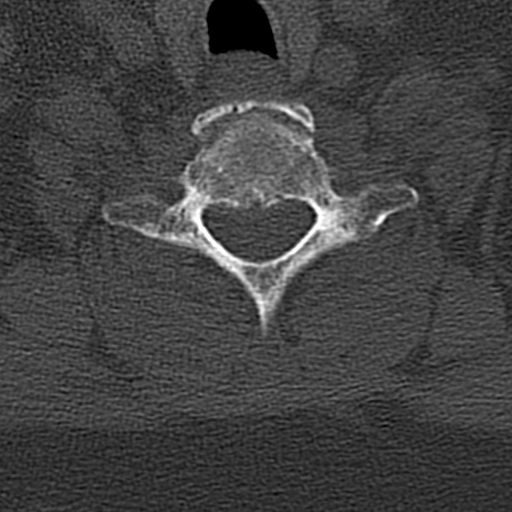
[im 28/79  bone]
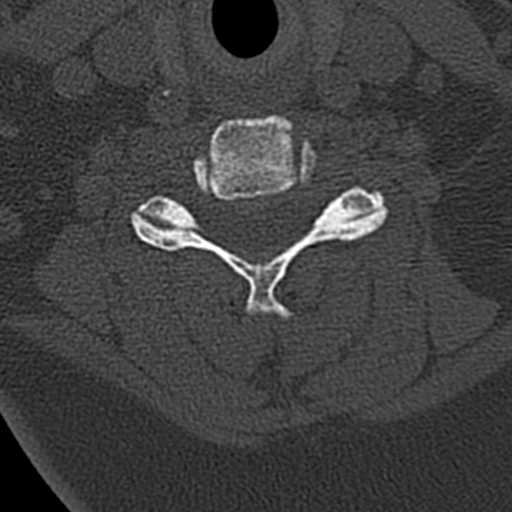
[im 34/79  brain]
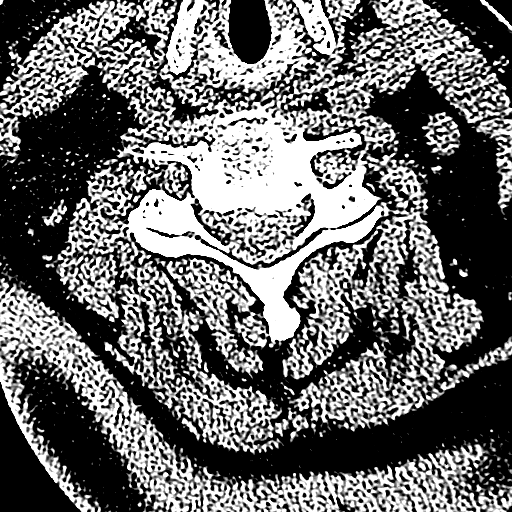
[im 34/79  bone]
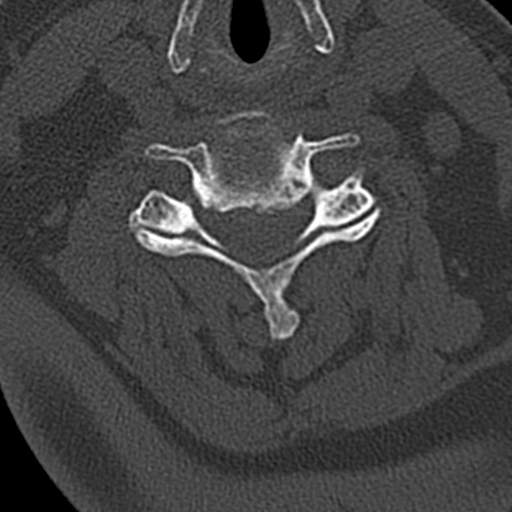
[im 40/79  bone]
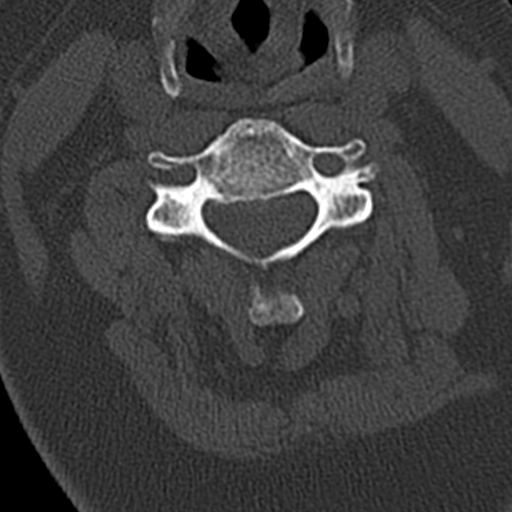
[im 45/79  bone]
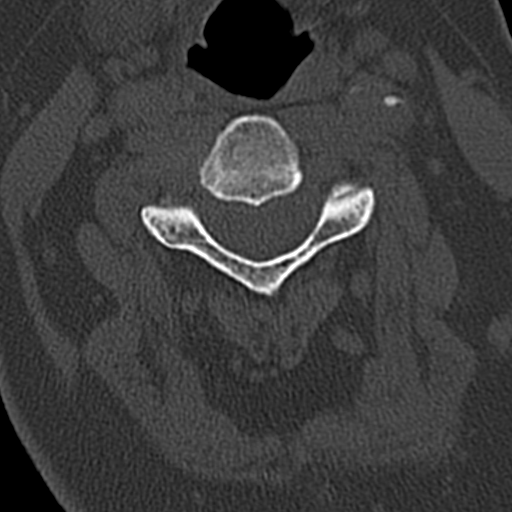
[im 51/79  bone]
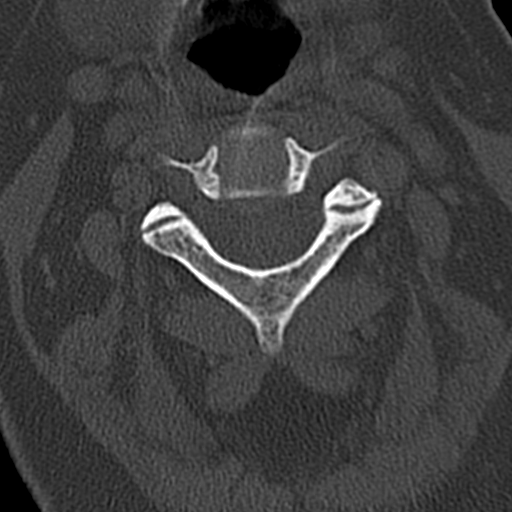
[im 62/79  brain]
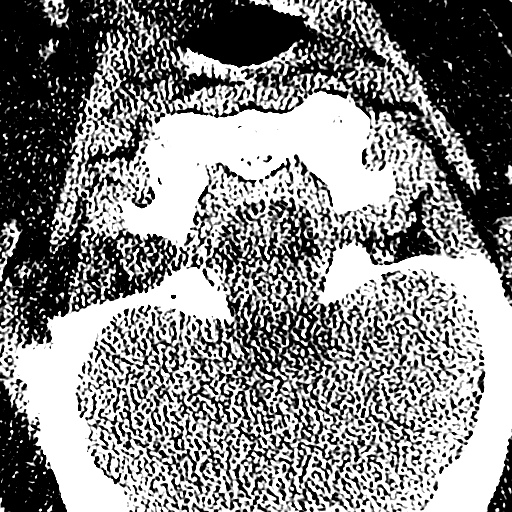
[im 62/79  bone]
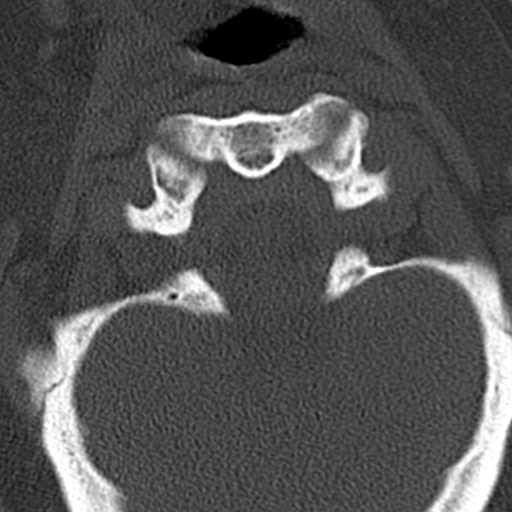
[im 67/79  bone]
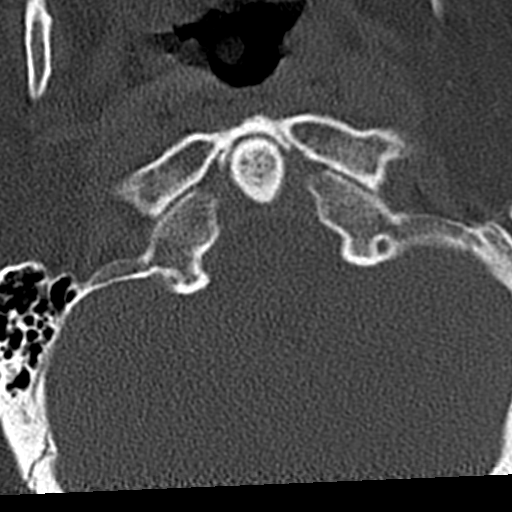
[im 73/79  bone]
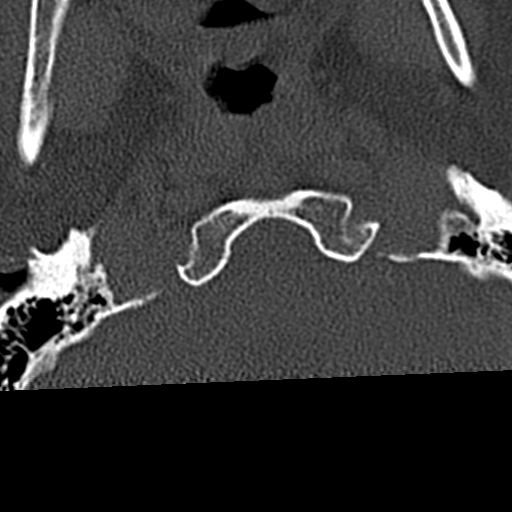

[16 of 47 positions shown; findings below may reference images not displayed]

FINDINGS: CT HEAD FINDINGS

Brain: Ventricles and sulci are appropriate for patient's age. No
evidence for acute cortically based infarct, intracranial
hemorrhage, mass lesion or mass-effect.

Vascular: Unremarkable.

Skull: Intact.

Other: None.

CT MAXILLOFACIAL FINDINGS

Osseous: No fracture or mandibular dislocation. No destructive
process.

Orbits: Unremarkable.

Sinuses: Unremarkable.

Soft tissues: Negative.

CT CERVICAL SPINE FINDINGS

Alignment: Normal anatomic alignment.

Skull base and vertebrae: Intact.

Soft tissues and spinal canal: No prevertebral fluid or swelling. No
visible canal hematoma.

Disc levels: Degenerative disc disease most pronounced C4-5 with
posterior disc osteophyte complex narrowing the canal. No acute
fracture.

Upper chest: Unremarkable.

Other: None.
IMPRESSION: No acute intracranial process. No maxillofacial fracture. No acute
cervical spine fracture.

## 2021-09-10 MED ORDER — ONDANSETRON HCL 4 MG/2ML IJ SOLN
4.0000 mg | Freq: Four times a day (QID) | INTRAMUSCULAR | Status: DC | PRN
  Administered 2021-09-11: 4 mg via INTRAVENOUS
  Filled 2021-09-10: qty 2

## 2021-09-10 MED ORDER — SODIUM CHLORIDE 0.9 % IV SOLN: INTRAVENOUS | Status: DC

## 2021-09-10 MED ORDER — ACETAMINOPHEN 650 MG RE SUPP: 650.0000 mg | Freq: Four times a day (QID) | RECTAL | Status: DC | PRN

## 2021-09-10 MED ORDER — METOPROLOL TARTRATE 50 MG PO TABS
50.0000 mg | ORAL_TABLET | Freq: Two times a day (BID) | ORAL | Status: DC
  Administered 2021-09-11 – 2021-09-16 (×11): 50 mg via ORAL
  Filled 2021-09-10 (×11): qty 1

## 2021-09-10 MED ORDER — LABETALOL HCL 5 MG/ML IV SOLN: 10.0000 mg | INTRAVENOUS | Status: DC | PRN

## 2021-09-10 MED ORDER — POLYETHYLENE GLYCOL 3350 17 G PO PACK: 17.0000 g | PACK | Freq: Every day | ORAL | Status: DC | PRN

## 2021-09-10 MED ORDER — ACETAMINOPHEN 325 MG PO TABS
650.0000 mg | ORAL_TABLET | Freq: Four times a day (QID) | ORAL | Status: DC | PRN
  Administered 2021-09-10 – 2021-09-11 (×2): 650 mg via ORAL
  Filled 2021-09-10 (×2): qty 2

## 2021-09-10 MED ORDER — ROSUVASTATIN CALCIUM 5 MG PO TABS
10.0000 mg | ORAL_TABLET | Freq: Every day | ORAL | Status: DC
  Administered 2021-09-11 – 2021-09-16 (×6): 10 mg via ORAL
  Filled 2021-09-10: qty 1
  Filled 2021-09-10 (×5): qty 2

## 2021-09-10 MED ORDER — LOSARTAN POTASSIUM 50 MG PO TABS
100.0000 mg | ORAL_TABLET | Freq: Every day | ORAL | Status: DC
  Administered 2021-09-11 – 2021-09-16 (×6): 100 mg via ORAL
  Filled 2021-09-10 (×4): qty 2
  Filled 2021-09-10: qty 4
  Filled 2021-09-10: qty 2

## 2021-09-10 MED ORDER — INSULIN ASPART 100 UNIT/ML IJ SOLN
0.0000 [IU] | INTRAMUSCULAR | Status: DC
  Administered 2021-09-10: 8 [IU] via SUBCUTANEOUS
  Administered 2021-09-11: 5 [IU] via SUBCUTANEOUS
  Administered 2021-09-11: 15 [IU] via SUBCUTANEOUS
  Administered 2021-09-11: 3 [IU] via SUBCUTANEOUS
  Administered 2021-09-11 (×2): 2 [IU] via SUBCUTANEOUS
  Administered 2021-09-11: 15 [IU] via SUBCUTANEOUS
  Administered 2021-09-12 (×2): 3 [IU] via SUBCUTANEOUS
  Filled 2021-09-10 (×6): qty 1

## 2021-09-10 MED ORDER — MORPHINE SULFATE (PF) 4 MG/ML IV SOLN
4.0000 mg | INTRAVENOUS | Status: DC | PRN
  Administered 2021-09-11 (×2): 4 mg via INTRAVENOUS
  Filled 2021-09-10 (×2): qty 1

## 2021-09-10 MED ORDER — HEPARIN SODIUM (PORCINE) 5000 UNIT/ML IJ SOLN
5000.0000 [IU] | Freq: Three times a day (TID) | INTRAMUSCULAR | Status: DC
  Administered 2021-09-10 – 2021-09-16 (×18): 5000 [IU] via SUBCUTANEOUS
  Filled 2021-09-10 (×17): qty 1

## 2021-09-10 MED ORDER — MORPHINE SULFATE (PF) 4 MG/ML IV SOLN
4.0000 mg | Freq: Once | INTRAVENOUS | Status: AC
Start: 2021-09-10 — End: 2021-09-10
  Administered 2021-09-10: 4 mg via INTRAVENOUS
  Filled 2021-09-10: qty 1

## 2021-09-10 MED ORDER — ASPIRIN EC 81 MG PO TBEC
81.0000 mg | DELAYED_RELEASE_TABLET | Freq: Every day | ORAL | Status: DC
  Administered 2021-09-10 – 2021-09-16 (×7): 81 mg via ORAL
  Filled 2021-09-10 (×7): qty 1

## 2021-09-10 MED ORDER — ONDANSETRON HCL 4 MG PO TABS: 4.0000 mg | ORAL_TABLET | Freq: Four times a day (QID) | ORAL | Status: DC | PRN

## 2021-09-10 NOTE — ED Notes (Signed)
Dr. Sabra Heck to bedside for laceration repairs.

## 2021-09-10 NOTE — ED Notes (Signed)
Ok to remove c-collar per MD

## 2021-09-10 NOTE — ED Provider Notes (Signed)
[facility] EMERGENCY DEPARTMENT Provider Note   CSN: [id number] Arrival date & time: 09/10/21  1405     History Chief Complaint  Patient presents with   Fall    Amanda Mendez is a 65 y.o. female.   Fall    This patient is a 65 year old female with a known history of coronary disease status post angioplasty approximately 9 years ago.  She has a known history of diabetes and hypertension.  She presents to the hospital after having an accidental fall at home, she has been struggling with some left leg pain and states that her left leg gave out while she was walking in her trailer she fell striking the ground causing a laceration to her head, she then had 2 subsequent loss of consciousness episodes while she was on the ground.  She now complains of headache, left wrist pain and left leg pain.  She has no chest pain or shortness of breath and does not recall any palpitations.  Symptoms are persistent, worse with palpation of the head, not associated with vomiting or diarrhea.  No medications given prehospital, she was immobilized by paramedics.  Past Medical History:  Diagnosis Date   Anginal pain ([place])    Coronary artery disease    angioplasty 9/13   Exertional dyspnea    "usually; today it was with nothing" (11/03/2012)   GERD (gastroesophageal reflux disease)    Hypercholesterolemia with hypertriglyceridemia    Hypertension    Kidney stones    "I've had them 5 times; lithotripsy 1st time; flushed out  all the others" (11/03/2012)   Obesity (BMI 30-39.9)    Pneumonia 2012   Sinus headache    "weekly" (11/03/2012)   Type II diabetes mellitus ([place]) 2007    Patient Active Problem List   Diagnosis Date Noted   Fall 09/10/2021   Left leg pain 09/10/2021   Gastroenteritis, infectious 05/29/2014   Abdominal pain 05/28/2014   HLD (hyperlipidemia) 05/28/2014   CAD (coronary artery disease) 09/03/2012   DM (diabetes mellitus) ([place]) 08/14/2012   HTN (hypertension) 08/14/2012    Unstable angina ([place]) 44/12/270   Acute diastolic heart failure ([place]) 08/13/2012    Past Surgical History:  Procedure Laterality Date   CARDIAC CATHETERIZATION  11/25/29013   CORONARY ANGIOPLASTY WITH STENT PLACEMENT  08/2012   "1"   HEMATOMA EVACUATION  09/22/2012   Procedure: EVACUATION HEMATOMA;  Surgeon: Wynonia Sours, MD;  Location: [facility];  Service: Orthopedics;  Laterality: Right;  Evacuation Hematoma right hand   LEFT AND RIGHT HEART CATHETERIZATION WITH CORONARY ANGIOGRAM N/A 08/14/2012   Procedure: LEFT AND RIGHT HEART CATHETERIZATION WITH CORONARY ANGIOGRAM;  Surgeon: Minus Breeding, MD;  Location: [facility] CATH LAB;  Service: Cardiovascular;  Laterality: N/A;   LEFT HEART CATHETERIZATION WITH CORONARY ANGIOGRAM N/A 11/03/2012   Procedure: LEFT HEART CATHETERIZATION WITH CORONARY ANGIOGRAM;  Surgeon: Jolaine Artist, MD;  Location: [facility] CATH LAB;  Service: Cardiovascular;  Laterality: N/A;   LITHOTRIPSY  1990's   PERCUTANEOUS CORONARY STENT INTERVENTION (PCI-S)  08/14/2012   Procedure: PERCUTANEOUS CORONARY STENT INTERVENTION (PCI-S);  Surgeon: Minus Breeding, MD;  Location: [facility] CATH LAB;  Service: Cardiovascular;;   SKIN BIOPSY  2012   "off my back; it was nothing fatal; infection caused it" (11/03/2012)   TUBAL LIGATION  1980     OB History   No obstetric history on file.     Family History  Problem Relation Age of Onset   Heart failure Mother  Died in her 52s, had angina starting in her 99s   Crohn's disease Brother     Social History   Tobacco Use   Smoking status: Former    Packs/day: 1.50    Years: 37.00    Pack years: 55.50    Types: Cigarettes    Quit date: 06/11/2012    Years since quitting: 9.2   Smokeless tobacco: Never  Substance Use Topics   Alcohol use: Never    Comment: 11/03/2012 "aien't drank none in ~ 10 yr; then it was just an occasional daquiri on vacation"   Drug use: Never    Home Medications Prior to Admission  medications   Medication Sig Start Date End Date Taking? Authorizing Provider  azithromycin (ZITHROMAX) 250 MG tablet Take 2 tablets (500 mg) on  Day 1,  followed by 1 tablet (250 mg) once daily on Days 2 through 5. 08/07/21  Yes [provider]  ipratropium (ATROVENT) 0.06 % nasal spray 2 sprays into each nostril Three (3) times a day. 08/07/21 08/07/22 Yes [provider]  lidocaine (LIDODERM) 5 % Place onto the skin. 09/09/21 09/19/21 Yes [provider]  naproxen (NAPROSYN) 500 MG tablet Take by mouth. 08/30/21  Yes [provider]  aspirin EC 81 MG tablet Take 81 mg by mouth daily. Swallow whole.    [provider]  cephALEXin (KEFLEX) 500 MG capsule Take 1 capsule (500 mg total) by mouth 4 (four) times daily. Patient not taking: Reported on 07/19/2021 05/27/21   Fredia Sorrow, MD  Cholecalciferol (VITAMIN D3) 1.25 MG (50000 UT) TABS Take 5,000 Units by mouth.    [provider]  clopidogrel (PLAVIX) 75 MG tablet Take 75 mg by mouth daily. Patient not taking: Reported on 07/19/2021    [provider]  cyclobenzaprine (FLEXERIL) 10 MG tablet Take 10 mg by mouth 3 (three) times daily as needed. 06/08/21   [provider]  ergocalciferol (VITAMIN D2) 1.25 MG (50000 UT) capsule Take 50,000 Units by mouth 2 (two) times a week. 03/12/19   [provider]  gabapentin (NEURONTIN) 300 MG capsule Take 300 mg by mouth 3 (three) times daily. 09/06/21   [provider]  glipiZIDE (GLUCOTROL XL) 10 MG 24 hr tablet Take 10 mg by mouth 2 (two) times daily at 8 am and 10 pm.    [provider]  losartan-hydrochlorothiazide (HYZAAR) 100-25 MG per tablet Take 1 tablet by mouth every evening.    [provider]  meloxicam (MOBIC) 7.5 MG tablet Take 7.5 mg by mouth daily. 07/04/21   [provider]  metoprolol (LOPRESSOR) 50 MG tablet Take 50 mg by mouth 2 (two) times daily.    [provider]  Misc  Natural Products (FIBER 7 PO) Take by mouth daily.    [provider]  Multiple Vitamin (MULTIVITAMIN) capsule Take 1 capsule by mouth daily.    [provider]  omeprazole (PRILOSEC) 20 MG capsule Take 20 mg by mouth daily.    [provider]  ondansetron (ZOFRAN-ODT) 4 MG disintegrating tablet Take 4 mg by mouth every 8 (eight) hours as needed. 08/30/21   [provider]  oxybutynin (DITROPAN) 5 MG tablet Take 5 mg by mouth 2 (two) times daily. 06/08/21   [provider]  rosuvastatin (CRESTOR) 10 MG tablet Take 1 tablet (10 mg total) by mouth daily. 08/04/21   Minus Breeding, MD  traMADol (ULTRAM) 50 MG tablet Take 50 mg by mouth every 6 (six) hours as  needed. 08/30/21   [provider]    Allergies    Dapagliflozin and Codeine  Review of Systems   Review of Systems  All other systems reviewed and are negative.  Physical Exam Updated Vital Signs BP 125/73   Pulse (!) 110   Temp (!) 97.5 F (36.4 C) (Oral)   Resp (!) 21   Ht 1.575 m (5\' 2" )   Wt 74.8 kg   SpO2 92%   BMI 30.18 kg/m   Physical Exam Vitals and nursing note reviewed.  Constitutional:      General: She is not in acute distress. HENT:     Head: Normocephalic.     Comments: Contusion to the left temporal scalp, small laceration bleeding is controlled, tenderness around the bilateral maxilla but no obvious deformities, she can open and close her mouth without malocclusion, there is no trismus or torticollis, no blood in the mouth, small amount of dried blood in the naris    Mouth/Throat:     Mouth: Mucous membranes are moist.     Comments: Immobilized in cervical collar Eyes:     General: No scleral icterus.       Right eye: No discharge.        Left eye: No discharge.     Conjunctiva/sclera: Conjunctivae normal.     Pupils: Pupils are equal, round, and reactive to light.  Cardiovascular:     Rate and Rhythm: Normal rate and regular rhythm.  Pulmonary:      Effort: Pulmonary effort is normal.     Breath sounds: Normal breath sounds.  Chest:     Chest wall: No tenderness.  Abdominal:     Palpations: Abdomen is soft.     Tenderness: There is no abdominal tenderness.  Musculoskeletal:        General: Swelling, tenderness and deformity present.     Cervical back: Tenderness present.     Comments: Able to straight leg raise bilaterally but with pain in the left leg.  There is no obvious deformity though the left leg is slightly shortened compared to the right leg.  The patient has what appears to be some deformity around the left wrist and bruising of the dorsum of the left hand.  Supple joints diffusely except for the affected joints as noted, right side totally normal  Lymphadenopathy:     Cervical: No cervical adenopathy.  Skin:    General: Skin is warm and dry.     Findings: No rash.     Comments: Laceration and bruising is noted  Neurological:     General: No focal deficit present.     Comments: Speech is clear, movements are coordinated, strength is normal in all 4 extremities, cranial nerves III through XII are normal, patient is awake and alert    ED Results / Procedures / Treatments   Labs (all labs ordered are listed, but only abnormal results are displayed) Labs Reviewed  CBC WITH DIFFERENTIAL/PLATELET - Abnormal; Notable for the following components:      Result Value   WBC 14.7 (*)    Neutro Abs 9.6 (*)    Eosinophils Absolute 2.0 (*)    Abs Immature Granulocytes 0.10 (*)    All other components within normal limits  COMPREHENSIVE METABOLIC PANEL - Abnormal; Notable for the following components:   Glucose, Bld 376 (*)    BUN 33 (*)    Creatinine, Ser 1.40 (*)    Calcium 8.8 (*)    GFR, Estimated 41 (*)  All other components within normal limits  URINALYSIS, ROUTINE W REFLEX MICROSCOPIC - Abnormal; Notable for the following components:   APPearance HAZY (*)    Glucose, UA >=500 (*)    Protein, ur 30 (*)     Leukocytes,Ua MODERATE (*)    WBC, UA >50 (*)    Bacteria, UA RARE (*)    All other components within normal limits  CBG MONITORING, ED - Abnormal; Notable for the following components:   Glucose-Capillary 290 (*)    All other components within normal limits  RESP PANEL BY RT-PCR (FLU A&B, COVID) ARPGX2  URINE CULTURE  PROTIME-INR  CK  HEMOGLOBIN L2X  BASIC METABOLIC PANEL  CBC  HIV ANTIBODY (ROUTINE TESTING W REFLEX)    EKG EKG Interpretation  Date/Time:  Sunday September 10 2021 14:38:41 EDT Ventricular Rate:  78 PR Interval:  150 QRS Duration: 80 QT Interval:  368 QTC Calculation: 422 R Axis:   51 Text Interpretation: Sinus rhythm Nonspecific T abnormalities, lateral leads since last tracing no significant change Confirmed by Noemi Chapel [phone]) on 09/10/2021 2:43:51 PM  Radiology DG Wrist Complete Left  Result Date: 09/10/2021 CLINICAL DATA:  Fall. EXAM: LEFT WRIST - COMPLETE 3+ VIEW; LEFT HAND - COMPLETE 3+ VIEW COMPARISON:  None. FINDINGS: No acute fracture or dislocation. Joint spaces are preserved. Bone mineralization is normal. Soft tissue swelling along the radial aspect of the wrist and thumb. IMPRESSION: 1. Soft tissue swelling. No acute osseous abnormality. Electronically Signed   By: Titus Dubin M.D.   On: 09/10/2021 16:45   CT Head Wo Contrast  Result Date: 09/10/2021 CLINICAL DATA:  Patient status post fall. EXAM: CT HEAD WITHOUT CONTRAST CT MAXILLOFACIAL WITHOUT CONTRAST CT CERVICAL SPINE WITHOUT CONTRAST TECHNIQUE: Multidetector CT imaging of the head, cervical spine, and maxillofacial structures were performed using the standard protocol without intravenous contrast. Multiplanar CT image reconstructions of the cervical spine and maxillofacial structures were also generated. COMPARISON:  None. FINDINGS: CT HEAD FINDINGS Brain: Ventricles and sulci are appropriate for patient's age. No evidence for acute cortically based infarct, intracranial hemorrhage, mass  lesion or mass-effect. Vascular: Unremarkable. Skull: Intact. Other: None. CT MAXILLOFACIAL FINDINGS Osseous: No fracture or mandibular dislocation. No destructive process. Orbits: Unremarkable. Sinuses: Unremarkable. Soft tissues: Negative. CT CERVICAL SPINE FINDINGS Alignment: Normal anatomic alignment. Skull base and vertebrae: Intact. Soft tissues and spinal canal: No prevertebral fluid or swelling. No visible canal hematoma. Disc levels: Degenerative disc disease most pronounced C4-5 with posterior disc osteophyte complex narrowing the canal. No acute fracture. Upper chest: Unremarkable. Other: None. IMPRESSION: No acute intracranial process. No maxillofacial fracture. No acute cervical spine fracture. Electronically Signed   By: Lovey Newcomer M.D.   On: 09/10/2021 16:38   CT Cervical Spine Wo Contrast  Result Date: 09/10/2021 CLINICAL DATA:  Patient status post fall. EXAM: CT HEAD WITHOUT CONTRAST CT MAXILLOFACIAL WITHOUT CONTRAST CT CERVICAL SPINE WITHOUT CONTRAST TECHNIQUE: Multidetector CT imaging of the head, cervical spine, and maxillofacial structures were performed using the standard protocol without intravenous contrast. Multiplanar CT image reconstructions of the cervical spine and maxillofacial structures were also generated. COMPARISON:  None. FINDINGS: CT HEAD FINDINGS Brain: Ventricles and sulci are appropriate for patient's age. No evidence for acute cortically based infarct, intracranial hemorrhage, mass lesion or mass-effect. Vascular: Unremarkable. Skull: Intact. Other: None. CT MAXILLOFACIAL FINDINGS Osseous: No fracture or mandibular dislocation. No destructive process. Orbits: Unremarkable. Sinuses: Unremarkable. Soft tissues: Negative. CT CERVICAL SPINE FINDINGS Alignment: Normal anatomic alignment. Skull base and vertebrae: Intact. Soft  tissues and spinal canal: No prevertebral fluid or swelling. No visible canal hematoma. Disc levels: Degenerative disc disease most pronounced C4-5 with  posterior disc osteophyte complex narrowing the canal. No acute fracture. Upper chest: Unremarkable. Other: None. IMPRESSION: No acute intracranial process. No maxillofacial fracture. No acute cervical spine fracture. Electronically Signed   By: Lovey Newcomer M.D.   On: 09/10/2021 16:38   DG Hand Complete Left  Result Date: 09/10/2021 CLINICAL DATA:  Fall. EXAM: LEFT WRIST - COMPLETE 3+ VIEW; LEFT HAND - COMPLETE 3+ VIEW COMPARISON:  None. FINDINGS: No acute fracture or dislocation. Joint spaces are preserved. Bone mineralization is normal. Soft tissue swelling along the radial aspect of the wrist and thumb. IMPRESSION: 1. Soft tissue swelling. No acute osseous abnormality. Electronically Signed   By: Titus Dubin M.D.   On: 09/10/2021 16:45   DG Hip Unilat W or Wo Pelvis 2-3 Views Left  Result Date: 09/10/2021 CLINICAL DATA:  [place] at home today. Patient was found down. Tripped on her LEFT leg. EXAM: DG HIP (WITH OR WITHOUT PELVIS) 2-3V LEFT COMPARISON:  09/09/2021 FINDINGS: There is no evidence of hip fracture or dislocation. There is no evidence of arthropathy or other focal bone abnormality. IMPRESSION: Negative. Electronically Signed   By: Nolon Nations M.D.   On: 09/10/2021 16:43   CT Maxillofacial Wo Contrast  Result Date: 09/10/2021 CLINICAL DATA:  Patient status post fall. EXAM: CT HEAD WITHOUT CONTRAST CT MAXILLOFACIAL WITHOUT CONTRAST CT CERVICAL SPINE WITHOUT CONTRAST TECHNIQUE: Multidetector CT imaging of the head, cervical spine, and maxillofacial structures were performed using the standard protocol without intravenous contrast. Multiplanar CT image reconstructions of the cervical spine and maxillofacial structures were also generated. COMPARISON:  None. FINDINGS: CT HEAD FINDINGS Brain: Ventricles and sulci are appropriate for patient's age. No evidence for acute cortically based infarct, intracranial hemorrhage, mass lesion or mass-effect. Vascular: Unremarkable. Skull: Intact. Other:  None. CT MAXILLOFACIAL FINDINGS Osseous: No fracture or mandibular dislocation. No destructive process. Orbits: Unremarkable. Sinuses: Unremarkable. Soft tissues: Negative. CT CERVICAL SPINE FINDINGS Alignment: Normal anatomic alignment. Skull base and vertebrae: Intact. Soft tissues and spinal canal: No prevertebral fluid or swelling. No visible canal hematoma. Disc levels: Degenerative disc disease most pronounced C4-5 with posterior disc osteophyte complex narrowing the canal. No acute fracture. Upper chest: Unremarkable. Other: None. IMPRESSION: No acute intracranial process. No maxillofacial fracture. No acute cervical spine fracture. Electronically Signed   By: Lovey Newcomer M.D.   On: 09/10/2021 16:38    Procedures .Marland KitchenLaceration Repair  Date/Time: 09/10/2021 11:05 PM Performed by: Noemi Chapel, MD Authorized by: Noemi Chapel, MD   Consent:    Consent obtained:  Verbal   Consent given by:  Patient   Risks discussed:  Infection, pain, need for additional repair, poor cosmetic result and poor wound healing   Alternatives discussed:  No treatment and delayed treatment Universal protocol:    Imaging studies available: yes     Required blood products, implants, devices, and special equipment available: yes     Site/side marked: yes     Immediately prior to procedure, a time out was called: yes     Patient identity confirmed:  Verbally with patient Anesthesia:    Anesthesia method:  Local infiltration   Local anesthetic:  Lidocaine 1% WITH epi Laceration details:    Location:  Face   Face location:  Forehead   Length (cm):  4   Depth (mm):  3 Pre-procedure details:    Preparation:  Patient was prepped and  draped in usual sterile fashion and imaging obtained to evaluate for foreign bodies Exploration:    Hemostasis achieved with:  Direct pressure   Imaging obtained: x-ray     Imaging outcome: foreign body not noted     Wound exploration: wound explored through full range of motion and  entire depth of wound visualized     Wound extent: no fascia violation noted, no foreign bodies/material noted, no muscle damage noted, no nerve damage noted, no tendon damage noted, no underlying fracture noted and no vascular damage noted   Treatment:    Area cleansed with:  Betadine   Amount of cleaning:  Standard   Irrigation solution:  Sterile saline   Irrigation method:  Syringe Skin repair:    Repair method:  Sutures   Suture size:  5-0   Suture material:  Prolene   Suture technique:  Simple interrupted   Number of sutures:  5 Approximation:    Approximation:  Close Repair type:    Repair type:  Simple Post-procedure details:    Dressing:  Antibiotic ointment and sterile dressing   Procedure completion:  Tolerated well, no immediate complications Comments:         Medications Ordered in ED Medications  morphine 4 MG/ML injection 4 mg (has no administration in time range)  insulin aspart (novoLOG) injection 0-15 Units (8 Units Subcutaneous Given 09/10/21 2016)  0.9 %  sodium chloride infusion ( Intravenous New Bag/Given 09/10/21 2045)  aspirin EC tablet 81 mg (81 mg Oral Given 09/10/21 2141)  metoprolol tartrate (LOPRESSOR) tablet 50 mg (has no administration in time range)  rosuvastatin (CRESTOR) tablet 10 mg (has no administration in time range)  heparin injection 5,000 Units (5,000 Units Subcutaneous Given 09/10/21 2142)  acetaminophen (TYLENOL) tablet 650 mg (650 mg Oral Given 09/10/21 2141)    Or  acetaminophen (TYLENOL) suppository 650 mg ( Rectal See Alternative 09/10/21 2141)  ondansetron (ZOFRAN) tablet 4 mg (has no administration in time range)    Or  ondansetron (ZOFRAN) injection 4 mg (has no administration in time range)  polyethylene glycol (MIRALAX / GLYCOLAX) packet 17 g (has no administration in time range)  losartan (COZAAR) tablet 100 mg (has no administration in time range)  labetalol (NORMODYNE) injection 10 mg (has no administration in time range)   morphine 4 MG/ML injection 4 mg (4 mg Intravenous Given 09/10/21 1805)    ED Course  I have reviewed the triage vital signs and the nursing notes.  Pertinent labs & imaging results that were available during my care of the patient were reviewed by me and considered in my medical decision making (see chart for details).    MDM Rules/Calculators/A&P                           Multiple apparent traumatic injuries, need CT of the head cervical spine and maxillofacial structures as well as plain films of the wrist and hand and the hip on the left.  Patient agreeable, she does have good blood flow to the bilateral feet by pulses and capillary refill  This patient has abnormal pulses at the feet which is likely causing her rest pain and increasing claudication of the left leg.  I discussed the case with Dr. Trula Slade of the vascular surgery service who does want to see the patient in the hospital and will likely be able to perform an arteriogram on Tuesday.  The patient will need laceration repairs which I will  do at this time.  Will discuss with hospitalist regarding admission and transfer  I did discuss the care with vascular surgery, hospitalist will admit, laceration was repaired to the left forehead, the mid forehead laceration is less than half of a centimeter and not gaping, will just use topical antibiotics.  Additionally the right ear has a long thin superficial laceration which does not necessarily need repair, it does not go to the cartilage, she declined repair and and agreed to topical antibiotics which I think is reasonable.  Final Clinical Impression(s) / ED Diagnoses Final diagnoses:  Claudication of both lower extremities ([place])  Laceration of scalp, initial encounter  Concussion with loss of consciousness, initial encounter  Fall, initial encounter     Noemi Chapel, MD 09/10/21 2307

## 2021-09-10 NOTE — ED Triage Notes (Signed)
Pt arrived via [facility]. Pt fell at home @ 0800 today. Family found patient @1230  today. Pt states she fell because of her left leg causing her to trip. Pt confirms LOC x2. Denies thinners. Obviously hit her head. Left wrist deformity noted.

## 2021-09-10 NOTE — H&P (Signed)
History and Physical    Amanda Mendez DTO:[id number] DOB: September 09, 1956 DOA: 09/10/2021  PCP: Redmond School, MD   Patient coming from: Home  I have personally briefly reviewed patient's old medical records in [place]  Chief Complaint: Leg pain, fall  HPI: Amanda Mendez is a 65 y.o. female with medical history significant for  DM, HTN, CAD, Diastolic CHF. Patient presented to the ED with c/o a fall, with resultant trauma to her head, sustaining lacerations. She reports she was using her 4 prong walker to get up from a sitting position, her left leg gave way from the chronic pain she has been having. She fell forwards, against a book shelf, the books fell on her head and a heavy brazen statue also fell on her head.  She fell at about 8 a.m in the morning, her family gets up late in the morning and her door was closed, so they didn't hear and find her till about 1 this afternoon.She reports she passed out 2ce while she was on the floor, each episode lasting about 2 minutes. She is also on pain meds for her left leg pain and feel this contributed to her fall. She has been told in the past that she has blockages in both her legs, left worse than right.  She denies prior dizziness, no chest pain, no vomiting, diarrhea. She has had poor oral intake in the past week due to severe pain in her legs.   ED Course: Stable vitals. WBC 14.7. EKG without significant abnormalities. Head, cervical and maxillofacial CT without acute abnormalities. Pelvic, left wrist and hand xray without fracture.  EDP reports poor blood flow to bilat lower extremities, Right worse that left. VVS was consulted, Dr. Trula Slade recommended transfer to [facility], and will see on [facility], no heparin for now.    Review of Systems: As per HPI all other systems reviewed and negative.  Past Medical History:  Diagnosis Date   Anginal pain ([place])    Coronary artery disease    angioplasty 9/13   Exertional dyspnea    "usually;  today it was with nothing" (11/03/2012)   GERD (gastroesophageal reflux disease)    Hypercholesterolemia with hypertriglyceridemia    Hypertension    Kidney stones    "I've had them 5 times; lithotripsy 1st time; flushed out  all the others" (11/03/2012)   Obesity (BMI 30-39.9)    Pneumonia 2012   Sinus headache    "weekly" (11/03/2012)   Type II diabetes mellitus ([place]) 2007    Past Surgical History:  Procedure Laterality Date   CARDIAC CATHETERIZATION  11/25/29013   CORONARY ANGIOPLASTY WITH STENT PLACEMENT  08/2012   "1"   HEMATOMA EVACUATION  09/22/2012   Procedure: EVACUATION HEMATOMA;  Surgeon: Wynonia Sours, MD;  Location: [place];  Service: Orthopedics;  Laterality: Right;  Evacuation Hematoma right hand   LEFT AND RIGHT HEART CATHETERIZATION WITH CORONARY ANGIOGRAM N/A 08/14/2012   Procedure: LEFT AND RIGHT HEART CATHETERIZATION WITH CORONARY ANGIOGRAM;  Surgeon: Minus Breeding, MD;  Location: [facility] CATH LAB;  Service: Cardiovascular;  Laterality: N/A;   LEFT HEART CATHETERIZATION WITH CORONARY ANGIOGRAM N/A 11/03/2012   Procedure: LEFT HEART CATHETERIZATION WITH CORONARY ANGIOGRAM;  Surgeon: Jolaine Artist, MD;  Location: [facility] CATH LAB;  Service: Cardiovascular;  Laterality: N/A;   LITHOTRIPSY  1990's   PERCUTANEOUS CORONARY STENT INTERVENTION (PCI-S)  08/14/2012   Procedure: PERCUTANEOUS CORONARY STENT INTERVENTION (PCI-S);  Surgeon: Minus Breeding, MD;  Location: [facility] CATH LAB;  Service: Cardiovascular;;   SKIN BIOPSY  2012   "off my back; it was nothing fatal; infection caused it" (11/03/2012)   [place]     reports that she quit smoking about 9 years ago. Her smoking use included cigarettes. She has a 55.50 pack-year smoking history. She has never used smokeless tobacco. She reports that she does not drink alcohol and does not use drugs.  Allergies  Allergen Reactions   Dapagliflozin Itching   Codeine Nausea And Vomiting    But has tolerated  morphine    Family History  Problem Relation Age of Onset   Heart failure Mother        Died in her 75s, had angina starting in her 37s   Crohn's disease Brother     Prior to Admission medications   Medication Sig Start Date End Date Taking? Authorizing Provider  lidocaine (LIDODERM) 5 % Place onto the skin. 09/09/21 09/19/21 Yes [provider]  naproxen (NAPROSYN) 500 MG tablet Take by mouth. 08/30/21  Yes [provider]  aspirin EC 81 MG tablet Take 81 mg by mouth daily. Swallow whole.    [provider]  cephALEXin (KEFLEX) 500 MG capsule Take 1 capsule (500 mg total) by mouth 4 (four) times daily. Patient not taking: Reported on 07/19/2021 05/27/21   Fredia Sorrow, MD  Cholecalciferol (VITAMIN D3) 1.25 MG (50000 UT) TABS Take 5,000 Units by mouth.    [provider]  clopidogrel (PLAVIX) 75 MG tablet Take 75 mg by mouth daily. Patient not taking: Reported on 07/19/2021    [provider]  cyclobenzaprine (FLEXERIL) 10 MG tablet Take 10 mg by mouth 3 (three) times daily as needed. 06/08/21   [provider]  ergocalciferol (VITAMIN D2) 1.25 MG (50000 UT) capsule TAKE 1 CAPSULE TWICE A WEEK Patient not taking: Reported on 07/19/2021 03/12/19   [provider]  gabapentin (NEURONTIN) 300 MG capsule Take 300 mg by mouth 3 (three) times daily. 09/06/21   [provider]  glipiZIDE (GLUCOTROL XL) 10 MG 24 hr tablet Take 10 mg by mouth 2 (two) times daily at 8 am and 10 pm.    [provider]  losartan-hydrochlorothiazide (HYZAAR) 100-25 MG per tablet Take 1 tablet by mouth every evening.    [provider]  meloxicam (MOBIC) 7.5 MG tablet Take 7.5 mg by mouth daily. 07/04/21   [provider]  metoprolol (LOPRESSOR) 50 MG tablet Take 50 mg by mouth 2 (two) times daily.    [provider]  Misc Natural Products (FIBER 7 PO) Take by mouth daily.    [provider]  Multiple Vitamin  (MULTIVITAMIN) capsule Take 1 capsule by mouth daily.    [provider]  omeprazole (PRILOSEC) 20 MG capsule Take 20 mg by mouth daily.    [provider]  ondansetron (ZOFRAN-ODT) 4 MG disintegrating tablet Take 4 mg by mouth every 8 (eight) hours as needed. 08/30/21   [provider]  oxybutynin (DITROPAN) 5 MG tablet Take 5 mg by mouth 2 (two) times daily. 06/08/21   [provider]  rosuvastatin (CRESTOR) 10 MG tablet Take 1 tablet (10 mg total) by mouth daily. 08/04/21   Minus Breeding, MD  traMADol (ULTRAM) 50 MG tablet Take 50 mg by mouth every 6 (six) hours as needed. 08/30/21   [provider]    Physical Exam: Vitals:   09/10/21 1430 09/10/21 1451 09/10/21 1730 09/10/21 1800  BP:  (!) 148/97 (!) 161/76 Marland Kitchen)  141/64  Pulse:  79 83 86  Resp:  (!) 21 17 20   Temp:  (!) 97.5 F (36.4 C)    TempSrc:  Oral    SpO2:  97% 99% 94%  Weight: 74.8 kg     Height: 5\' 2"  (1.575 m)       Constitutional: dried blood on hands, ears, head Vitals:   09/10/21 1430 09/10/21 1451 09/10/21 1730 09/10/21 1800  BP:  (!) 148/97 (!) 161/76 (!) 141/64  Pulse:  79 83 86  Resp:  (!) 21 17 20   Temp:  (!) 97.5 F (36.4 C)    TempSrc:  Oral    SpO2:  97% 99% 94%  Weight: 74.8 kg     Height: 5\' 2"  (1.575 m)      Eyes: PERRL, lids and conjunctivae normal ENMT: Mucous membranes are dry. Neck: normal, supple, no masses, no thyromegaly Respiratory: clear to auscultation bilaterally, no wheezing, no crackles. Normal respiratory effort. No accessory muscle use.  Cardiovascular: Regular rate and rhythm, no murmurs / rubs / gallops. No extremity edema. Both feet are cold to touch, extending upwards to lower 1/3 of legs, L worse than right. Barely palpable dp pulse right feet, unable to palpate on the left.  Abdomen: no tenderness, no masses palpated. No hepatosplenomegaly. Bowel sounds positive.  Musculoskeletal: no clubbing / cyanosis. No joint deformity upper and  lower extremities. Good ROM, no contractures. Normal muscle tone.  Skin: bruising to left wrist dorsal surface, no rashes, lesions, ulcers. No induration Neurologic:  no apparent cranial nerve abnormality  Psychiatric: Normal judgment and insight. Alert and oriented x 3. Normal mood.   Labs on Admission: I have personally reviewed following labs and imaging studies  CBC: Recent Labs  Lab 09/10/21 1647  WBC 14.7*  NEUTROABS 9.6*  HGB 14.1  HCT 42.0  MCV 94.6  PLT 098   Basic Metabolic Panel: Recent Labs  Lab 09/10/21 1647  NA 136  K 4.1  CL 99  CO2 27  GLUCOSE 376*  BUN 33*  CREATININE 1.40*  CALCIUM 8.8*   GFR: Estimated Creatinine Clearance: 37.4 mL/min (A) (by C-G formula based on SCr of 1.4 mg/dL (H)). Liver Function Tests: Recent Labs  Lab 09/10/21 1647  AST 18  ALT 30  ALKPHOS 91  BILITOT 0.9  PROT 7.1  ALBUMIN 3.9   Coagulation Profile: Recent Labs  Lab 09/10/21 1647  INR 0.9    Radiological Exams on Admission: DG Wrist Complete Left  Result Date: 09/10/2021 CLINICAL DATA:  Fall. EXAM: LEFT WRIST - COMPLETE 3+ VIEW; LEFT HAND - COMPLETE 3+ VIEW COMPARISON:  None. FINDINGS: No acute fracture or dislocation. Joint spaces are preserved. Bone mineralization is normal. Soft tissue swelling along the radial aspect of the wrist and thumb. IMPRESSION: 1. Soft tissue swelling. No acute osseous abnormality. Electronically Signed   By: Titus Dubin M.D.   On: 09/10/2021 16:45   CT Head Wo Contrast  Result Date: 09/10/2021 CLINICAL DATA:  Patient status post fall. EXAM: CT HEAD WITHOUT CONTRAST CT MAXILLOFACIAL WITHOUT CONTRAST CT CERVICAL SPINE WITHOUT CONTRAST TECHNIQUE: Multidetector CT imaging of the head, cervical spine, and maxillofacial structures were performed using the standard protocol without intravenous contrast. Multiplanar CT image reconstructions of the cervical spine and maxillofacial structures were also generated. COMPARISON:  None. FINDINGS:  CT HEAD FINDINGS Brain: Ventricles and sulci are appropriate for patient's age. No evidence for acute cortically based infarct, intracranial hemorrhage, mass lesion or mass-effect. Vascular: Unremarkable. Skull: Intact. Other: None.  CT MAXILLOFACIAL FINDINGS Osseous: No fracture or mandibular dislocation. No destructive process. Orbits: Unremarkable. Sinuses: Unremarkable. Soft tissues: Negative. CT CERVICAL SPINE FINDINGS Alignment: Normal anatomic alignment. Skull base and vertebrae: Intact. Soft tissues and spinal canal: No prevertebral fluid or swelling. No visible canal hematoma. Disc levels: Degenerative disc disease most pronounced C4-5 with posterior disc osteophyte complex narrowing the canal. No acute fracture. Upper chest: Unremarkable. Other: None. IMPRESSION: No acute intracranial process. No maxillofacial fracture. No acute cervical spine fracture. Electronically Signed   By: Lovey Newcomer M.D.   On: 09/10/2021 16:38   CT Cervical Spine Wo Contrast  Result Date: 09/10/2021 CLINICAL DATA:  Patient status post fall. EXAM: CT HEAD WITHOUT CONTRAST CT MAXILLOFACIAL WITHOUT CONTRAST CT CERVICAL SPINE WITHOUT CONTRAST TECHNIQUE: Multidetector CT imaging of the head, cervical spine, and maxillofacial structures were performed using the standard protocol without intravenous contrast. Multiplanar CT image reconstructions of the cervical spine and maxillofacial structures were also generated. COMPARISON:  None. FINDINGS: CT HEAD FINDINGS Brain: Ventricles and sulci are appropriate for patient's age. No evidence for acute cortically based infarct, intracranial hemorrhage, mass lesion or mass-effect. Vascular: Unremarkable. Skull: Intact. Other: None. CT MAXILLOFACIAL FINDINGS Osseous: No fracture or mandibular dislocation. No destructive process. Orbits: Unremarkable. Sinuses: Unremarkable. Soft tissues: Negative. CT CERVICAL SPINE FINDINGS Alignment: Normal anatomic alignment. Skull base and vertebrae: Intact.  Soft tissues and spinal canal: No prevertebral fluid or swelling. No visible canal hematoma. Disc levels: Degenerative disc disease most pronounced C4-5 with posterior disc osteophyte complex narrowing the canal. No acute fracture. Upper chest: Unremarkable. Other: None. IMPRESSION: No acute intracranial process. No maxillofacial fracture. No acute cervical spine fracture. Electronically Signed   By: Lovey Newcomer M.D.   On: 09/10/2021 16:38   DG Hand Complete Left  Result Date: 09/10/2021 CLINICAL DATA:  Fall. EXAM: LEFT WRIST - COMPLETE 3+ VIEW; LEFT HAND - COMPLETE 3+ VIEW COMPARISON:  None. FINDINGS: No acute fracture or dislocation. Joint spaces are preserved. Bone mineralization is normal. Soft tissue swelling along the radial aspect of the wrist and thumb. IMPRESSION: 1. Soft tissue swelling. No acute osseous abnormality. Electronically Signed   By: Titus Dubin M.D.   On: 09/10/2021 16:45   DG Hip Unilat W or Wo Pelvis 2-3 Views Left  Result Date: 09/10/2021 CLINICAL DATA:  [place] at home today. Patient was found down. Tripped on her LEFT leg. EXAM: DG HIP (WITH OR WITHOUT PELVIS) 2-3V LEFT COMPARISON:  09/09/2021 FINDINGS: There is no evidence of hip fracture or dislocation. There is no evidence of arthropathy or other focal bone abnormality. IMPRESSION: Negative. Electronically Signed   By: Nolon Nations M.D.   On: 09/10/2021 16:43   CT Maxillofacial Wo Contrast  Result Date: 09/10/2021 CLINICAL DATA:  Patient status post fall. EXAM: CT HEAD WITHOUT CONTRAST CT MAXILLOFACIAL WITHOUT CONTRAST CT CERVICAL SPINE WITHOUT CONTRAST TECHNIQUE: Multidetector CT imaging of the head, cervical spine, and maxillofacial structures were performed using the standard protocol without intravenous contrast. Multiplanar CT image reconstructions of the cervical spine and maxillofacial structures were also generated. COMPARISON:  None. FINDINGS: CT HEAD FINDINGS Brain: Ventricles and sulci are appropriate for  patient's age. No evidence for acute cortically based infarct, intracranial hemorrhage, mass lesion or mass-effect. Vascular: Unremarkable. Skull: Intact. Other: None. CT MAXILLOFACIAL FINDINGS Osseous: No fracture or mandibular dislocation. No destructive process. Orbits: Unremarkable. Sinuses: Unremarkable. Soft tissues: Negative. CT CERVICAL SPINE FINDINGS Alignment: Normal anatomic alignment. Skull base and vertebrae: Intact. Soft tissues and spinal canal: No prevertebral fluid or  swelling. No visible canal hematoma. Disc levels: Degenerative disc disease most pronounced C4-5 with posterior disc osteophyte complex narrowing the canal. No acute fracture. Upper chest: Unremarkable. Other: None. IMPRESSION: No acute intracranial process. No maxillofacial fracture. No acute cervical spine fracture. Electronically Signed   By: Lovey Newcomer M.D.   On: 09/10/2021 16:38    EKG: Independently reviewed. Sinus, rate 78, QTC 422. Non specific changes to leads II, AVL, V5.  Assessment/Plan Principal Problem:   Fall Active Problems:   DM (diabetes mellitus) ([place])   CAD (coronary artery disease)   Left leg pain   Mechanical Fall -2/2 chronic leg pain. Sustained lacerations to head. Reports passing out 2ce after books and then a heavy brazen stature both fell on her head. Head, cervical pelvic and left wrist xray all without fracture or acute abnormality. No suspicious for cardiorespiratory etiology at this time.  - Check CK- normal 40. - IV morphine - Appears dehydrated N/s 100cc/hr x 15hrs  Lower extremity pain- L > R- concerning for peripheral arterial disease, ischemia.  - EDP talked to Dr. Trula Slade with Vascular surgery, admit [facility] - Care order instruction to page on arrival to [facility] - NPO midnight -  CAD- S/p angioplasty with stent 2013. Stable, no chest pain, EKG without significant changes. Plavix was discontinued ~ 2 months ago. She is now on aspirin and statins.  DM- random glucose 376 -  HGBa1c - SSI- M- q4h - Hold home glipizide  HTN-  - Resume metoprolol, [place], metoprolol - Hold HCTZ to allow for hydration   DVT prophylaxis: Heparin Code Status: Full code Family Communication: Daughter at bedside Disposition Plan:  ~ 2 days Consults called: Vascular Admission status: Obs tele  Bethena Roys MD [facility]  09/10/2021, 8:05 PM

## 2021-09-11 DIAGNOSIS — M79605 Pain in left leg: Secondary | ICD-10-CM | POA: Diagnosis not present

## 2021-09-11 DIAGNOSIS — Z87891 Personal history of nicotine dependence: Secondary | ICD-10-CM | POA: Diagnosis not present

## 2021-09-11 DIAGNOSIS — I10 Essential (primary) hypertension: Secondary | ICD-10-CM

## 2021-09-11 DIAGNOSIS — E119 Type 2 diabetes mellitus without complications: Secondary | ICD-10-CM | POA: Diagnosis not present

## 2021-09-11 DIAGNOSIS — Z20822 Contact with and (suspected) exposure to covid-19: Secondary | ICD-10-CM | POA: Diagnosis present

## 2021-09-11 DIAGNOSIS — M545 Low back pain, unspecified: Secondary | ICD-10-CM | POA: Diagnosis not present

## 2021-09-11 DIAGNOSIS — Y92019 Unspecified place in single-family (private) house as the place of occurrence of the external cause: Secondary | ICD-10-CM | POA: Diagnosis not present

## 2021-09-11 DIAGNOSIS — I70222 Atherosclerosis of native arteries of extremities with rest pain, left leg: Secondary | ICD-10-CM | POA: Diagnosis present

## 2021-09-11 DIAGNOSIS — E1151 Type 2 diabetes mellitus with diabetic peripheral angiopathy without gangrene: Secondary | ICD-10-CM | POA: Diagnosis present

## 2021-09-11 DIAGNOSIS — S060X9A Concussion with loss of consciousness of unspecified duration, initial encounter: Secondary | ICD-10-CM | POA: Diagnosis present

## 2021-09-11 DIAGNOSIS — E669 Obesity, unspecified: Secondary | ICD-10-CM | POA: Diagnosis present

## 2021-09-11 DIAGNOSIS — E1142 Type 2 diabetes mellitus with diabetic polyneuropathy: Secondary | ICD-10-CM | POA: Diagnosis present

## 2021-09-11 DIAGNOSIS — E860 Dehydration: Secondary | ICD-10-CM | POA: Diagnosis present

## 2021-09-11 DIAGNOSIS — N1831 Chronic kidney disease, stage 3a: Secondary | ICD-10-CM | POA: Diagnosis present

## 2021-09-11 DIAGNOSIS — I2510 Atherosclerotic heart disease of native coronary artery without angina pectoris: Secondary | ICD-10-CM

## 2021-09-11 DIAGNOSIS — E1165 Type 2 diabetes mellitus with hyperglycemia: Secondary | ICD-10-CM | POA: Diagnosis present

## 2021-09-11 DIAGNOSIS — W19XXXD Unspecified fall, subsequent encounter: Secondary | ICD-10-CM

## 2021-09-11 DIAGNOSIS — E1122 Type 2 diabetes mellitus with diabetic chronic kidney disease: Secondary | ICD-10-CM | POA: Diagnosis present

## 2021-09-11 DIAGNOSIS — I70221 Atherosclerosis of native arteries of extremities with rest pain, right leg: Secondary | ICD-10-CM | POA: Diagnosis present

## 2021-09-11 DIAGNOSIS — S0101XA Laceration without foreign body of scalp, initial encounter: Secondary | ICD-10-CM | POA: Diagnosis present

## 2021-09-11 DIAGNOSIS — E1369 Other specified diabetes mellitus with other specified complication: Secondary | ICD-10-CM | POA: Diagnosis not present

## 2021-09-11 DIAGNOSIS — Z955 Presence of coronary angioplasty implant and graft: Secondary | ICD-10-CM | POA: Diagnosis not present

## 2021-09-11 DIAGNOSIS — I739 Peripheral vascular disease, unspecified: Secondary | ICD-10-CM | POA: Diagnosis present

## 2021-09-11 DIAGNOSIS — E781 Pure hyperglyceridemia: Secondary | ICD-10-CM | POA: Diagnosis present

## 2021-09-11 DIAGNOSIS — I130 Hypertensive heart and chronic kidney disease with heart failure and stage 1 through stage 4 chronic kidney disease, or unspecified chronic kidney disease: Secondary | ICD-10-CM | POA: Diagnosis present

## 2021-09-11 DIAGNOSIS — W1830XA Fall on same level, unspecified, initial encounter: Secondary | ICD-10-CM | POA: Diagnosis present

## 2021-09-11 DIAGNOSIS — M79604 Pain in right leg: Secondary | ICD-10-CM | POA: Diagnosis not present

## 2021-09-11 DIAGNOSIS — Z23 Encounter for immunization: Secondary | ICD-10-CM | POA: Diagnosis not present

## 2021-09-11 DIAGNOSIS — Z8249 Family history of ischemic heart disease and other diseases of the circulatory system: Secondary | ICD-10-CM | POA: Diagnosis not present

## 2021-09-11 DIAGNOSIS — M25572 Pain in left ankle and joints of left foot: Secondary | ICD-10-CM | POA: Diagnosis not present

## 2021-09-11 DIAGNOSIS — G8929 Other chronic pain: Secondary | ICD-10-CM | POA: Diagnosis present

## 2021-09-11 DIAGNOSIS — E7800 Pure hypercholesterolemia, unspecified: Secondary | ICD-10-CM | POA: Diagnosis not present

## 2021-09-11 DIAGNOSIS — E1169 Type 2 diabetes mellitus with other specified complication: Secondary | ICD-10-CM | POA: Diagnosis present

## 2021-09-11 DIAGNOSIS — Z6830 Body mass index (BMI) 30.0-30.9, adult: Secondary | ICD-10-CM | POA: Diagnosis not present

## 2021-09-11 DIAGNOSIS — I5032 Chronic diastolic (congestive) heart failure: Secondary | ICD-10-CM | POA: Diagnosis present

## 2021-09-11 LAB — BASIC METABOLIC PANEL
Anion gap: 9 (ref 5–15)
BUN: 28 mg/dL — ABNORMAL HIGH (ref 8–23)
CO2: 25 mmol/L (ref 22–32)
Calcium: 8.3 mg/dL — ABNORMAL LOW (ref 8.9–10.3)
Chloride: 102 mmol/L (ref 98–111)
Creatinine, Ser: 1.25 mg/dL — ABNORMAL HIGH (ref 0.44–1.00)
GFR, Estimated: 48 mL/min — ABNORMAL LOW (ref >60)
Glucose, Bld: 170 mg/dL — ABNORMAL HIGH (ref 70–99)
Potassium: 3.4 mmol/L — ABNORMAL LOW (ref 3.5–5.1)
Sodium: 136 mmol/L (ref 135–145)

## 2021-09-11 LAB — CBC
HCT: 40.3 % (ref 36.0–46.0)
Hemoglobin: 13.2 g/dL (ref 12.0–15.0)
MCH: 30.9 pg (ref 26.0–34.0)
MCHC: 32.8 g/dL (ref 30.0–36.0)
MCV: 94.4 fL (ref 80.0–100.0)
Platelets: 304 10*3/uL (ref 150–400)
RBC: 4.27 MIL/uL (ref 3.87–5.11)
RDW: 13 % (ref 11.5–15.5)
WBC: 14.1 10*3/uL — ABNORMAL HIGH (ref 4.0–10.5)
nRBC: 0 % (ref 0.0–0.2)

## 2021-09-11 LAB — CBG MONITORING, ED
Glucose-Capillary: 121 mg/dL — ABNORMAL HIGH (ref 70–99)
Glucose-Capillary: 142 mg/dL — ABNORMAL HIGH (ref 70–99)
Glucose-Capillary: 175 mg/dL — ABNORMAL HIGH (ref 70–99)
Glucose-Capillary: 208 mg/dL — ABNORMAL HIGH (ref 70–99)
Glucose-Capillary: 269 mg/dL — ABNORMAL HIGH (ref 70–99)
Glucose-Capillary: 404 mg/dL — ABNORMAL HIGH (ref 70–99)

## 2021-09-11 LAB — URINE CULTURE

## 2021-09-11 LAB — GLUCOSE, CAPILLARY: Glucose-Capillary: 364 mg/dL — ABNORMAL HIGH (ref 70–99)

## 2021-09-11 LAB — HIV ANTIBODY (ROUTINE TESTING W REFLEX): HIV Screen 4th Generation wRfx: NONREACTIVE

## 2021-09-11 MED ORDER — BACITRACIN-NEOMYCIN-POLYMYXIN 400-5-5000 EX OINT
TOPICAL_OINTMENT | Freq: Once | CUTANEOUS | Status: AC
  Administered 2021-09-11: 1 via TOPICAL
  Filled 2021-09-11: qty 2

## 2021-09-11 MED ORDER — POTASSIUM CHLORIDE 10 MEQ/100ML IV SOLN
10.0000 meq | INTRAVENOUS | Status: AC
Start: 2021-09-11 — End: 2021-09-11
  Administered 2021-09-11 (×2): 10 meq via INTRAVENOUS
  Filled 2021-09-11 (×2): qty 100

## 2021-09-11 MED ORDER — HYDROMORPHONE HCL 1 MG/ML IJ SOLN
0.5000 mg | INTRAMUSCULAR | Status: DC | PRN
  Administered 2021-09-11 – 2021-09-13 (×8): 1 mg via INTRAVENOUS
  Administered 2021-09-15 – 2021-09-16 (×5): 0.5 mg via INTRAVENOUS
  Filled 2021-09-11 (×2): qty 0.5
  Filled 2021-09-11: qty 1
  Filled 2021-09-11: qty 0.5
  Filled 2021-09-11 (×2): qty 1
  Filled 2021-09-11 (×2): qty 0.5
  Filled 2021-09-11 (×5): qty 1
  Filled 2021-09-11: qty 0.5

## 2021-09-11 MED ORDER — SODIUM CHLORIDE 0.9 % IV SOLN: INTRAVENOUS | Status: DC

## 2021-09-11 NOTE — ED Notes (Signed)
Called to notify patients family that transport was here about to leave with the patient, but got no answer.

## 2021-09-11 NOTE — Progress Notes (Signed)
PROGRESS NOTE   Amanda Mendez  QMG:[id number] DOB: November 20, 1956 DOA: 09/10/2021 PCP: Redmond School, MD   Chief Complaint  Patient presents with   Fall   Level of care: Telemetry Medical  Brief Admission History:  65 y.o. female with medical history significant for  DM, HTN, CAD, Diastolic CHF. Patient presented to the ED with c/o a fall, with resultant trauma to her head, sustaining lacerations. She reports she was using her 4 prong walker to get up from a sitting position, her left leg gave way from the chronic pain she has been having. She fell forwards, against a book shelf, the books fell on her head and a heavy brazen statue also fell on her head.  She fell at about 8 a.m in the morning, her family gets up late in the morning and her door was closed, so they didn't hear and find her till about 1 this afternoon.She reports she passed out 2ce while she was on the floor, each episode lasting about 2 minutes. She is also on pain meds for her left leg pain and feel this contributed to her fall. She has been told in the past that she has blockages in both her legs, left worse than right.   She denies prior dizziness, no chest pain, no vomiting, diarrhea. She has had poor oral intake in the past week due to severe pain in her legs.    ED Course: Stable vitals. WBC 14.7. EKG without significant abnormalities. Head, cervical and maxillofacial CT without acute abnormalities. Pelvic, left wrist and hand xray without fracture.  EDP reports poor blood flow to bilat lower extremities, Right worse that left. VVS was consulted, Dr. Trula Slade recommended [facility], and will see on [facility], no heparin for now.     Assessment & Plan:   Principal Problem:   Fall Active Problems:   DM (diabetes mellitus) ([place])   HTN (hypertension)   CAD (coronary artery disease)   Left leg pain   PAD with ischemia of lower limbs L>R  - Pt is transferring to [facility] for aortagram and VVS consultation - Dr.  Trula Slade was consulted in ED and requested transfer with plans to see patient at [facility] - Pain management ordered for severe pain symptoms - NPO after midnight in preparation for possible aortogram   Severe left leg pain  - concern for ischemia  - pain management as ordered and VVS consultation   CAD - s/p angioplasty with stent placement in 2013 - Pt reports she is no aspirin and rosuvastatin but plavix was stopped 2 months ago  Type 2 Diabetes mellitus uncontrolled with vascular complications - SSI coverage and frequent CBG monitoring ordered - Follow up A1c test - Home glipizide on hold in hospital  Essential hypertension  - resumed home metoprolol, losartan  CKD stage 3a  - Gentle IV fluid hydration ordered for renal protection - renally dose medication as appropriate    DVT prophylaxis: SQ heparin  Code Status: full code  Family Communication: t/c to daughter  Disposition: transfer to [facility] for VVS consultation  Status is: Observation  The patient remains OBS appropriate and will d/c before 2 midnights.  Dispo: The patient is from: Home              Anticipated d/c is to: Home              Patient currently is not medically stable to d/c.   Difficult to place patient No   Consultants:  Vascular surgery Dr. Trula Slade   Procedures:  Tentative aortagram   Antimicrobials:  N/a    Subjective: Pt reports ongoing constant fairly severe left leg pain   Objective: Vitals:   09/11/21 0700 09/11/21 1018 09/11/21 1200 09/11/21 1300  BP: 112/76 138/84 139/75 116/70  Pulse: 95 (!) 101 81 74  Resp: 18 19 16 16   Temp:  98.3 F (36.8 C)    TempSrc:  Oral    SpO2: 95% 98% 96% 95%  Weight:      Height:       No intake or output data in the 24 hours ending 09/11/21 1426 Filed Weights   09/10/21 1430  Weight: 74.8 kg   Examination:  General exam: abrasions seen on face, repaired laceration on scalp, Appears calm.    Respiratory system: Clear to auscultation. Respiratory  effort normal. Cardiovascular system: normal S1 & S2 heard. Tachycardic rate.  No JVD, murmurs, rubs, gallops or clicks. No pedal edema. Gastrointestinal system: Abdomen is nondistended, soft and nontender. No organomegaly or masses felt. Normal bowel sounds heard. Central nervous system: Alert and oriented. No focal neurological deficits. Extremities: Symmetric 5 x 5 power.  Barely palpable pedal pulses bilateral.   Skin: No rashes, lesions or ulcers Psychiatry: Judgement and insight appear normal. Mood & affect appropriate.   Data Reviewed: I have personally reviewed following labs and imaging studies  CBC: Recent Labs  Lab 09/10/21 1647 09/11/21 0516  WBC 14.7* 14.1*  NEUTROABS 9.6*  --   HGB 14.1 13.2  HCT 42.0 40.3  MCV 94.6 94.4  PLT 287 951    Basic Metabolic Panel: Recent Labs  Lab 09/10/21 1647 09/11/21 0516  NA 136 136  K 4.1 3.4*  CL 99 102  CO2 27 25  GLUCOSE 376* 170*  BUN 33* 28*  CREATININE 1.40* 1.25*  CALCIUM 8.8* 8.3*    GFR: Estimated Creatinine Clearance: 41.9 mL/min (A) (by C-G formula based on SCr of 1.25 mg/dL (H)).  Liver Function Tests: Recent Labs  Lab 09/10/21 1647  AST 18  ALT 30  ALKPHOS 91  BILITOT 0.9  PROT 7.1  ALBUMIN 3.9    CBG: Recent Labs  Lab 09/11/21 0016 09/11/21 0238 09/11/21 0419 09/11/21 0804 09/11/21 1210  GLUCAP 404* 269* 208* 121* 142*    Recent Results (from the past 240 hour(s))  Resp Panel by RT-PCR (Flu A&B, Covid) Nasopharyngeal Swab     Status: None   Collection Time: 09/10/21  6:40 PM   Specimen: Nasopharyngeal Swab; Nasopharyngeal(NP) swabs in vial transport medium  Result Value Ref Range Status   SARS Coronavirus 2 by RT PCR NEGATIVE NEGATIVE Final    Comment: (NOTE) SARS-CoV-2 target nucleic acids are NOT DETECTED.  The SARS-CoV-2 RNA is generally detectable in upper respiratory specimens during the acute phase of infection. The lowest concentration of SARS-CoV-2 viral copies this assay  can detect is 138 copies/mL. A negative result does not preclude SARS-Cov-2 infection and should not be used as the sole basis for treatment or other patient management decisions. A negative result may occur with  improper specimen collection/handling, submission of specimen other than nasopharyngeal swab, presence of viral mutation(s) within the areas targeted by this assay, and inadequate number of viral copies(<138 copies/mL). A negative result must be combined with clinical observations, patient history, and epidemiological information. The expected result is Negative.  Fact Sheet for Patients:  EntrepreneurPulse.com.au  Fact Sheet for Healthcare Providers:  IncredibleEmployment.be  This test is no t yet approved or cleared  by the [place] and  has been authorized for detection and/or diagnosis of SARS-CoV-2 by FDA under an Emergency Use Authorization (EUA). This EUA will remain  in effect (meaning this test can be used) for the duration of the COVID-19 declaration under Section 564(b)(1) of the Act, 21 U.S.C.section 360bbb-3(b)(1), unless the authorization is terminated  or revoked sooner.       Influenza A by PCR NEGATIVE NEGATIVE Final   Influenza B by PCR NEGATIVE NEGATIVE Final    Comment: (NOTE) The Xpert Xpress SARS-CoV-2/FLU/RSV plus assay is intended as an aid in the diagnosis of influenza from Nasopharyngeal swab specimens and should not be used as a sole basis for treatment. Nasal washings and aspirates are unacceptable for Xpert Xpress SARS-CoV-2/FLU/RSV testing.  Fact Sheet for Patients: EntrepreneurPulse.com.au  Fact Sheet for Healthcare Providers: IncredibleEmployment.be  This test is not yet approved or cleared by the [place] FDA and has been authorized for detection and/or diagnosis of SARS-CoV-2 by FDA under an Emergency Use Authorization (EUA). This EUA will  remain in effect (meaning this test can be used) for the duration of the COVID-19 declaration under Section 564(b)(1) of the Act, 21 U.S.C. section 360bbb-3(b)(1), unless the authorization is terminated or revoked.  Performed at [facility], [address], [place], [place] [address]      Radiology Studies: DG Wrist Complete Left  Result Date: 09/10/2021 CLINICAL DATA:  Fall. EXAM: LEFT WRIST - COMPLETE 3+ VIEW; LEFT HAND - COMPLETE 3+ VIEW COMPARISON:  None. FINDINGS: No acute fracture or dislocation. Joint spaces are preserved. Bone mineralization is normal. Soft tissue swelling along the radial aspect of the wrist and thumb. IMPRESSION: 1. Soft tissue swelling. No acute osseous abnormality. Electronically Signed   By: Titus Dubin M.D.   On: 09/10/2021 16:45   CT Head Wo Contrast  Result Date: 09/10/2021 CLINICAL DATA:  Patient status post fall. EXAM: CT HEAD WITHOUT CONTRAST CT MAXILLOFACIAL WITHOUT CONTRAST CT CERVICAL SPINE WITHOUT CONTRAST TECHNIQUE: Multidetector CT imaging of the head, cervical spine, and maxillofacial structures were performed using the standard protocol without intravenous contrast. Multiplanar CT image reconstructions of the cervical spine and maxillofacial structures were also generated. COMPARISON:  None. FINDINGS: CT HEAD FINDINGS Brain: Ventricles and sulci are appropriate for patient's age. No evidence for acute cortically based infarct, intracranial hemorrhage, mass lesion or mass-effect. Vascular: Unremarkable. Skull: Intact. Other: None. CT MAXILLOFACIAL FINDINGS Osseous: No fracture or mandibular dislocation. No destructive process. Orbits: Unremarkable. Sinuses: Unremarkable. Soft tissues: Negative. CT CERVICAL SPINE FINDINGS Alignment: Normal anatomic alignment. Skull base and vertebrae: Intact. Soft tissues and spinal canal: No prevertebral fluid or swelling. No visible canal hematoma. Disc levels: Degenerative disc disease most pronounced C4-5 with  posterior disc osteophyte complex narrowing the canal. No acute fracture. Upper chest: Unremarkable. Other: None. IMPRESSION: No acute intracranial process. No maxillofacial fracture. No acute cervical spine fracture. Electronically Signed   By: Lovey Newcomer M.D.   On: 09/10/2021 16:38   CT Cervical Spine Wo Contrast  Result Date: 09/10/2021 CLINICAL DATA:  Patient status post fall. EXAM: CT HEAD WITHOUT CONTRAST CT MAXILLOFACIAL WITHOUT CONTRAST CT CERVICAL SPINE WITHOUT CONTRAST TECHNIQUE: Multidetector CT imaging of the head, cervical spine, and maxillofacial structures were performed using the standard protocol without intravenous contrast. Multiplanar CT image reconstructions of the cervical spine and maxillofacial structures were also generated. COMPARISON:  None. FINDINGS: CT HEAD FINDINGS Brain: Ventricles and sulci are appropriate for patient's age. No evidence for acute cortically based infarct, intracranial hemorrhage,  mass lesion or mass-effect. Vascular: Unremarkable. Skull: Intact. Other: None. CT MAXILLOFACIAL FINDINGS Osseous: No fracture or mandibular dislocation. No destructive process. Orbits: Unremarkable. Sinuses: Unremarkable. Soft tissues: Negative. CT CERVICAL SPINE FINDINGS Alignment: Normal anatomic alignment. Skull base and vertebrae: Intact. Soft tissues and spinal canal: No prevertebral fluid or swelling. No visible canal hematoma. Disc levels: Degenerative disc disease most pronounced C4-5 with posterior disc osteophyte complex narrowing the canal. No acute fracture. Upper chest: Unremarkable. Other: None. IMPRESSION: No acute intracranial process. No maxillofacial fracture. No acute cervical spine fracture. Electronically Signed   By: Lovey Newcomer M.D.   On: 09/10/2021 16:38   DG Hand Complete Left  Result Date: 09/10/2021 CLINICAL DATA:  Fall. EXAM: LEFT WRIST - COMPLETE 3+ VIEW; LEFT HAND - COMPLETE 3+ VIEW COMPARISON:  None. FINDINGS: No acute fracture or dislocation. Joint  spaces are preserved. Bone mineralization is normal. Soft tissue swelling along the radial aspect of the wrist and thumb. IMPRESSION: 1. Soft tissue swelling. No acute osseous abnormality. Electronically Signed   By: Titus Dubin M.D.   On: 09/10/2021 16:45   DG Hip Unilat W or Wo Pelvis 2-3 Views Left  Result Date: 09/10/2021 CLINICAL DATA:  [place] at home today. Patient was found down. Tripped on her LEFT leg. EXAM: DG HIP (WITH OR WITHOUT PELVIS) 2-3V LEFT COMPARISON:  09/09/2021 FINDINGS: There is no evidence of hip fracture or dislocation. There is no evidence of arthropathy or other focal bone abnormality. IMPRESSION: Negative. Electronically Signed   By: Nolon Nations M.D.   On: 09/10/2021 16:43   CT Maxillofacial Wo Contrast  Result Date: 09/10/2021 CLINICAL DATA:  Patient status post fall. EXAM: CT HEAD WITHOUT CONTRAST CT MAXILLOFACIAL WITHOUT CONTRAST CT CERVICAL SPINE WITHOUT CONTRAST TECHNIQUE: Multidetector CT imaging of the head, cervical spine, and maxillofacial structures were performed using the standard protocol without intravenous contrast. Multiplanar CT image reconstructions of the cervical spine and maxillofacial structures were also generated. COMPARISON:  None. FINDINGS: CT HEAD FINDINGS Brain: Ventricles and sulci are appropriate for patient's age. No evidence for acute cortically based infarct, intracranial hemorrhage, mass lesion or mass-effect. Vascular: Unremarkable. Skull: Intact. Other: None. CT MAXILLOFACIAL FINDINGS Osseous: No fracture or mandibular dislocation. No destructive process. Orbits: Unremarkable. Sinuses: Unremarkable. Soft tissues: Negative. CT CERVICAL SPINE FINDINGS Alignment: Normal anatomic alignment. Skull base and vertebrae: Intact. Soft tissues and spinal canal: No prevertebral fluid or swelling. No visible canal hematoma. Disc levels: Degenerative disc disease most pronounced C4-5 with posterior disc osteophyte complex narrowing the canal. No acute  fracture. Upper chest: Unremarkable. Other: None. IMPRESSION: No acute intracranial process. No maxillofacial fracture. No acute cervical spine fracture. Electronically Signed   By: Lovey Newcomer M.D.   On: 09/10/2021 16:38    Scheduled Meds:  aspirin EC  81 mg Oral Daily   heparin  5,000 Units Subcutaneous Q8H   insulin aspart  0-15 Units Subcutaneous Q4H   losartan  100 mg Oral Daily   metoprolol tartrate  50 mg Oral BID   rosuvastatin  10 mg Oral Daily   Continuous Infusions:  sodium chloride 75 mL/hr at 09/11/21 0845    LOS: 0 days   Time spent: 38 mins   Carnelius Hammitt Wynetta Emery, MD How to contact the [facility] Attending or Consulting provider [place] or covering provider during after hours [place], for this patient?  Check the care team in [facility] and look for a) attending/consulting [facility] provider listed and b) the [facility] team listed Log into [email or web address] and use  [facility] universal password to access. If you do not have the password, please contact the hospital operator. Locate the [facility] provider you are looking for under [facility] and page to a number that you can be directly reached. If you still have difficulty reaching the provider, please page the [facility] (Director on Call) for the Hospitalists listed on amion for assistance.  09/11/2021, 2:26 PM

## 2021-09-11 NOTE — ED Notes (Signed)
Pt vomited but states she believes its because of the pain medication she received. Pt states she doesn't want anymore of that (dilaudid). After patient was cleaned she began to eat [employer] food that her daughter brought to her. Pt advised that she may want to wait a bit so that the nausea medicine that I just gave her can work. Pt states "oh Im fine now, I need to eat". Emesis bag provided.

## 2021-09-11 NOTE — Progress Notes (Signed)
Paged on call vascular surgeon per care order. Ordered to keep patient NPO at midnight but no other orders given at this time. Vascular surgery will assess in AM.

## 2021-09-11 NOTE — ED Notes (Signed)
Pt refused dinner tray due to already have eaten [employer].

## 2021-09-11 NOTE — ED Notes (Signed)
Report given to [facility]. 

## 2021-09-12 ENCOUNTER — Encounter (HOSPITAL_COMMUNITY)
Admission: EM | Disposition: A | Discharge status: Home Health | Payer: Self-pay | Source: Home / Self Care | Attending: Family Medicine

## 2021-09-12 ENCOUNTER — Encounter (HOSPITAL_COMMUNITY): Payer: Self-pay | Admitting: Internal Medicine

## 2021-09-12 DIAGNOSIS — M79605 Pain in left leg: Secondary | ICD-10-CM

## 2021-09-12 DIAGNOSIS — Z87891 Personal history of nicotine dependence: Secondary | ICD-10-CM

## 2021-09-12 DIAGNOSIS — E1369 Other specified diabetes mellitus with other specified complication: Secondary | ICD-10-CM | POA: Diagnosis not present

## 2021-09-12 DIAGNOSIS — W19XXXD Unspecified fall, subsequent encounter: Secondary | ICD-10-CM | POA: Diagnosis not present

## 2021-09-12 DIAGNOSIS — I2510 Atherosclerotic heart disease of native coronary artery without angina pectoris: Secondary | ICD-10-CM

## 2021-09-12 DIAGNOSIS — E7800 Pure hypercholesterolemia, unspecified: Secondary | ICD-10-CM

## 2021-09-12 DIAGNOSIS — M79604 Pain in right leg: Secondary | ICD-10-CM

## 2021-09-12 DIAGNOSIS — E119 Type 2 diabetes mellitus without complications: Secondary | ICD-10-CM

## 2021-09-12 DIAGNOSIS — I10 Essential (primary) hypertension: Secondary | ICD-10-CM

## 2021-09-12 DIAGNOSIS — Z8249 Family history of ischemic heart disease and other diseases of the circulatory system: Secondary | ICD-10-CM

## 2021-09-12 HISTORY — PX: ABDOMINAL AORTOGRAM W/LOWER EXTREMITY: CATH118223

## 2021-09-12 LAB — GLUCOSE, CAPILLARY
Glucose-Capillary: 179 mg/dL — ABNORMAL HIGH (ref 70–99)
Glucose-Capillary: 182 mg/dL — ABNORMAL HIGH (ref 70–99)
Glucose-Capillary: 198 mg/dL — ABNORMAL HIGH (ref 70–99)
Glucose-Capillary: 255 mg/dL — ABNORMAL HIGH (ref 70–99)
Glucose-Capillary: 253 mg/dL — ABNORMAL HIGH (ref 70–99)

## 2021-09-12 LAB — BASIC METABOLIC PANEL
Anion gap: 8 (ref 5–15)
BUN: 21 mg/dL (ref 8–23)
CO2: 27 mmol/L (ref 22–32)
Calcium: 8.8 mg/dL — ABNORMAL LOW (ref 8.9–10.3)
Chloride: 105 mmol/L (ref 98–111)
Creatinine, Ser: 1.21 mg/dL — ABNORMAL HIGH (ref 0.44–1.00)
GFR, Estimated: 49 mL/min — ABNORMAL LOW (ref >60)
Glucose, Bld: 182 mg/dL — ABNORMAL HIGH (ref 70–99)
Potassium: 4.1 mmol/L (ref 3.5–5.1)
Sodium: 140 mmol/L (ref 135–145)

## 2021-09-12 LAB — TSH: TSH: 3.913 u[IU]/mL (ref 0.350–4.500)

## 2021-09-12 LAB — VITAMIN D 25 HYDROXY (VIT D DEFICIENCY, FRACTURES): Vit D, 25-Hydroxy: 50.67 ng/mL (ref 30–100)

## 2021-09-12 LAB — HEMOGLOBIN A1C
Hgb A1c MFr Bld: 11.8 % — ABNORMAL HIGH (ref 4.8–5.6)
Mean Plasma Glucose: 292 mg/dL

## 2021-09-12 SURGERY — ABDOMINAL AORTOGRAM W/LOWER EXTREMITY
Anesthesia: LOCAL | Laterality: Left

## 2021-09-12 MED ORDER — INFLUENZA VAC A&B SA ADJ QUAD 0.5 ML IM PRSY
0.5000 mL | PREFILLED_SYRINGE | INTRAMUSCULAR | Status: AC
  Administered 2021-09-13: 0.5 mL via INTRAMUSCULAR
  Filled 2021-09-12: qty 0.5

## 2021-09-12 MED ORDER — HEPARIN (PORCINE) IN NACL 1000-0.9 UT/500ML-% IV SOLN
INTRAVENOUS | Status: AC
  Filled 2021-09-12: qty 1000

## 2021-09-12 MED ORDER — FENTANYL CITRATE (PF) 100 MCG/2ML IJ SOLN
INTRAMUSCULAR | Status: AC
  Filled 2021-09-12: qty 2

## 2021-09-12 MED ORDER — SODIUM CHLORIDE 0.9 % IV SOLN: 250.0000 mL | INTRAVENOUS | Status: DC | PRN

## 2021-09-12 MED ORDER — GABAPENTIN 300 MG PO CAPS
300.0000 mg | ORAL_CAPSULE | Freq: Three times a day (TID) | ORAL | Status: DC
  Administered 2021-09-12 – 2021-09-16 (×14): 300 mg via ORAL
  Filled 2021-09-12 (×14): qty 1

## 2021-09-12 MED ORDER — CLONIDINE HCL 0.1 MG PO TABS
0.1000 mg | ORAL_TABLET | Freq: Two times a day (BID) | ORAL | Status: DC
  Administered 2021-09-12 – 2021-09-16 (×9): 0.1 mg via ORAL
  Filled 2021-09-12 (×9): qty 1

## 2021-09-12 MED ORDER — MIDAZOLAM HCL 2 MG/2ML IJ SOLN
INTRAMUSCULAR | Status: DC | PRN
  Administered 2021-09-12: 2 mg via INTRAVENOUS

## 2021-09-12 MED ORDER — INSULIN GLARGINE-YFGN 100 UNIT/ML ~~LOC~~ SOLN
12.0000 [IU] | Freq: Every day | SUBCUTANEOUS | Status: DC
  Administered 2021-09-12 – 2021-09-15 (×4): 12 [IU] via SUBCUTANEOUS
  Filled 2021-09-12 (×4): qty 0.12

## 2021-09-12 MED ORDER — OXYCODONE HCL 5 MG PO TABS
5.0000 mg | ORAL_TABLET | ORAL | Status: DC | PRN
  Administered 2021-09-14 – 2021-09-16 (×4): 5 mg via ORAL
  Filled 2021-09-12 (×5): qty 1

## 2021-09-12 MED ORDER — HEPARIN (PORCINE) IN NACL 1000-0.9 UT/500ML-% IV SOLN
INTRAVENOUS | Status: DC | PRN
  Administered 2021-09-12 (×2): 500 mL

## 2021-09-12 MED ORDER — LIDOCAINE HCL (PF) 1 % IJ SOLN
INTRAMUSCULAR | Status: DC | PRN
  Administered 2021-09-12: 15 mL

## 2021-09-12 MED ORDER — DEXTROSE-NACL 5-0.45 % IV SOLN: INTRAVENOUS | Status: DC

## 2021-09-12 MED ORDER — HYDRALAZINE HCL 20 MG/ML IJ SOLN
5.0000 mg | INTRAMUSCULAR | Status: DC | PRN
Start: 2021-09-12 — End: 2021-09-16

## 2021-09-12 MED ORDER — FENTANYL CITRATE (PF) 100 MCG/2ML IJ SOLN
INTRAMUSCULAR | Status: DC | PRN
  Administered 2021-09-12: 50 ug via INTRAVENOUS

## 2021-09-12 MED ORDER — SODIUM CHLORIDE 0.9% FLUSH
3.0000 mL | Freq: Two times a day (BID) | INTRAVENOUS | Status: DC
  Administered 2021-09-12 – 2021-09-16 (×8): 3 mL via INTRAVENOUS

## 2021-09-12 MED ORDER — ACETAMINOPHEN 325 MG PO TABS
650.0000 mg | ORAL_TABLET | ORAL | Status: DC | PRN
  Administered 2021-09-15: 650 mg via ORAL
  Filled 2021-09-12: qty 2

## 2021-09-12 MED ORDER — SODIUM CHLORIDE 0.9 % WEIGHT BASED INFUSION
1.0000 mL/kg/h | INTRAVENOUS | Status: AC
  Administered 2021-09-12: 1 mL/kg/h via INTRAVENOUS

## 2021-09-12 MED ORDER — METOPROLOL TARTRATE 5 MG/5ML IV SOLN: 5.0000 mg | INTRAVENOUS | Status: DC | PRN

## 2021-09-12 MED ORDER — INSULIN ASPART 100 UNIT/ML IJ SOLN
0.0000 [IU] | Freq: Three times a day (TID) | INTRAMUSCULAR | Status: DC
  Administered 2021-09-12: 8 [IU] via SUBCUTANEOUS
  Administered 2021-09-12: 5 [IU] via SUBCUTANEOUS
  Administered 2021-09-13: 11 [IU] via SUBCUTANEOUS
  Administered 2021-09-13: 5 [IU] via SUBCUTANEOUS
  Administered 2021-09-13: 15 [IU] via SUBCUTANEOUS
  Administered 2021-09-13: 5 [IU] via SUBCUTANEOUS
  Administered 2021-09-14: 15 [IU] via SUBCUTANEOUS
  Administered 2021-09-14: 8 [IU] via SUBCUTANEOUS

## 2021-09-12 MED ORDER — IODIXANOL 320 MG/ML IV SOLN
INTRAVENOUS | Status: DC | PRN
  Administered 2021-09-12: 70 mL via INTRA_ARTERIAL

## 2021-09-12 MED ORDER — SENNOSIDES-DOCUSATE SODIUM 8.6-50 MG PO TABS: 1.0000 | ORAL_TABLET | Freq: Every evening | ORAL | Status: DC | PRN

## 2021-09-12 MED ORDER — MIDAZOLAM HCL 2 MG/2ML IJ SOLN
INTRAMUSCULAR | Status: AC
  Filled 2021-09-12: qty 2

## 2021-09-12 MED ORDER — HYDRALAZINE HCL 20 MG/ML IJ SOLN: 10.0000 mg | INTRAMUSCULAR | Status: DC | PRN

## 2021-09-12 MED ORDER — PNEUMOCOCCAL VAC POLYVALENT 25 MCG/0.5ML IJ INJ
0.5000 mL | INJECTION | INTRAMUSCULAR | Status: AC
  Administered 2021-09-13: 0.5 mL via INTRAMUSCULAR
  Filled 2021-09-12: qty 0.5

## 2021-09-12 MED ORDER — IPRATROPIUM-ALBUTEROL 0.5-2.5 (3) MG/3ML IN SOLN: 3.0000 mL | RESPIRATORY_TRACT | Status: DC | PRN

## 2021-09-12 MED ORDER — PANTOPRAZOLE SODIUM 40 MG PO TBEC
40.0000 mg | DELAYED_RELEASE_TABLET | Freq: Every day | ORAL | Status: DC
  Administered 2021-09-12 – 2021-09-16 (×5): 40 mg via ORAL
  Filled 2021-09-12 (×5): qty 1

## 2021-09-12 MED ORDER — LIDOCAINE HCL (PF) 1 % IJ SOLN
INTRAMUSCULAR | Status: AC
  Filled 2021-09-12: qty 30

## 2021-09-12 MED ORDER — SODIUM CHLORIDE 0.9% FLUSH: 3.0000 mL | INTRAVENOUS | Status: DC | PRN

## 2021-09-12 MED ORDER — ONDANSETRON HCL 4 MG/2ML IJ SOLN: 4.0000 mg | Freq: Four times a day (QID) | INTRAMUSCULAR | Status: DC | PRN

## 2021-09-12 MED ORDER — LABETALOL HCL 5 MG/ML IV SOLN
10.0000 mg | INTRAVENOUS | Status: DC | PRN
Start: 2021-09-12 — End: 2021-09-16

## 2021-09-12 SURGICAL SUPPLY — 10 items
CATH ANGIO 5F BER2 100CM (CATHETERS) ×2 IMPLANT
CATH OMNI FLUSH 5F 65CM (CATHETERS) ×2 IMPLANT
DEVICE VASC CLSR CELT ART 5 (Vascular Products) ×2 IMPLANT
KIT PV (KITS) ×2 IMPLANT
SHEATH PINNACLE 5F 10CM (SHEATH) ×2 IMPLANT
SHEATH PROBE COVER 6X72 (BAG) ×2 IMPLANT
SYR MEDRAD MARK V 150ML (SYRINGE) ×2 IMPLANT
TRANSDUCER W/STOPCOCK (MISCELLANEOUS) ×2 IMPLANT
TRAY PV CATH (CUSTOM PROCEDURE TRAY) ×2 IMPLANT
WIRE BENTSON .035X145CM (WIRE) ×2 IMPLANT

## 2021-09-12 NOTE — Progress Notes (Signed)
PROGRESS NOTE    Amanda Mendez  LOV:[id number] DOB: 17-Dec-1955 DOA: 09/10/2021 PCP: Redmond School, MD   Brief Narrative:  65 year old with history of DM2, CAD, HTN, diastolic CHF came to the ED after sustaining a fall where she claims her legs gave out.  She reported she fell early in the morning on the day of admission but her family woke up later in the day when they checked up on her and was found to be on the floor.  In the ED she had mild leukocytosis, trauma work-up was overall negative but had diminished blood flow to the lower extremity right greater than left.  Vascular surgery was consulted and patient was transferred to [facility] for further evaluation.   Assessment & Plan:   Principal Problem:   Fall Active Problems:   DM (diabetes mellitus) ([place])   HTN (hypertension)   CAD (coronary artery disease)   Left leg pain    PAD with concerns for critical limb ischemia LE Severe leg pain -Vascular team has been consulted.  Patient is n.p.o. past midnight with anticipation for aortogram this morning.  Pain control, bowel regimen   CAD status post PCI with stent placement in 2013 -Patient has not been on aspirin or statin?  Plavix was apparently stopped 2 months ago.  Aspirin and statin has been restarted.  We will check lipid panel and A1c   Type 2 Diabetes mellitus uncontrolled with vascular complications, uncontrolled with hyperglycemia -Holding home po meds.  A1c 11.8 - Sliding scale and Accu-Cheks.  Started long-acting 12 units - Consult diabetic coordinator   Essential hypertension  -On metoprolol twice daily, losartan 100 mg daily   CKD stage 3a, creatinine 1.2 -Creatinine at baseline.  Getting gentle hydration especially because patient will be receiving contrast    DVT prophylaxis: SQ Heparin Code Status: Full Code Family Communication:  DIL updated.   Status is: Inpatient  Remains inpatient appropriate because:Inpatient level of care appropriate  due to severity of illness.  Plans for angiography today and depending on the results she may need percutaneous intervention versus surgery.  Dispo: The patient is from: Home              Anticipated d/c is to: Home              Patient currently is not medically stable to d/c.   Difficult to place patient No    Subjective: Patient was seen and examined right before angiography.  She reported of lower extremity pain bilaterally, mainly claudication type of symptoms for the past several weeks.  Review of Systems Otherwise negative except as per HPI, including: General: Denies fever, chills, night sweats or unintended weight loss. Resp: Denies cough, wheezing, shortness of breath. Cardiac: Denies chest pain, palpitations, orthopnea, paroxysmal nocturnal dyspnea. GI: Denies abdominal pain, nausea, vomiting, diarrhea or constipation GU: Denies dysuria, frequency, hesitancy or incontinence MS: leg pain b/l Neuro: Denies headache, neurologic deficits (focal weakness, numbness, tingling), abnormal gait Psych: Denies anxiety, depression, SI/HI/AVH Skin: Denies new rashes or lesions ID: Denies sick contacts, exotic exposures, travel  Examination:  General exam: Appears calm and comfortable  Respiratory system: Clear to auscultation. Respiratory effort normal. Cardiovascular system: S1 & S2 heard, RRR. No JVD, murmurs, rubs, gallops or clicks. No pedal edema. Gastrointestinal system: Abdomen is nondistended, soft and nontender. No organomegaly or masses felt. Normal bowel sounds heard. Central nervous system: Alert and oriented. No focal neurological deficits. Extremities: Symmetric 5 x 5 power.  Slightly dusky toes  noted Skin: No rashes, lesions or ulcers Psychiatry: Judgement and insight appear normal. Mood & affect appropriate.     Objective: Vitals:   09/11/21 1800 09/11/21 1951 09/11/21 2355 09/12/21 0439  BP: (!) 132/95 139/67 127/65 135/67  Pulse:  83 83 70  Resp: 18 20 18 18    Temp:  98.5 F (36.9 C) 98 F (36.7 C) 98.1 F (36.7 C)  TempSrc:  Oral Oral Oral  SpO2: 94% 96% 92% 93%  Weight:    77.1 kg  Height:        Intake/Output Summary (Last 24 hours) at 09/12/2021 0801 Last data filed at 09/12/2021 0100 Gross per 24 hour  Intake 2008.06 ml  Output 300 ml  Net 1708.06 ml   Filed Weights   09/10/21 1430 09/12/21 0439  Weight: 74.8 kg 77.1 kg     Data Reviewed:   CBC: Recent Labs  Lab 09/10/21 1647 09/11/21 0516  WBC 14.7* 14.1*  NEUTROABS 9.6*  --   HGB 14.1 13.2  HCT 42.0 40.3  MCV 94.6 94.4  PLT 287 267   Basic Metabolic Panel: Recent Labs  Lab 09/10/21 1647 09/11/21 0516  NA 136 136  K 4.1 3.4*  CL 99 102  CO2 27 25  GLUCOSE 376* 170*  BUN 33* 28*  CREATININE 1.40* 1.25*  CALCIUM 8.8* 8.3*   GFR: Estimated Creatinine Clearance: 42.6 mL/min (A) (by C-G formula based on SCr of 1.25 mg/dL (H)). Liver Function Tests: Recent Labs  Lab 09/10/21 1647  AST 18  ALT 30  ALKPHOS 91  BILITOT 0.9  PROT 7.1  ALBUMIN 3.9   No results for input(s): LIPASE, AMYLASE in the last 168 hours. No results for input(s): AMMONIA in the last 168 hours. Coagulation Profile: Recent Labs  Lab 09/10/21 1647  INR 0.9   Cardiac Enzymes: Recent Labs  Lab 09/10/21 1647  CKTOTAL 40   BNP (last 3 results) No results for input(s): PROBNP in the last 8760 hours. HbA1C: Recent Labs    09/10/21 1647  HGBA1C 11.8*   CBG: Recent Labs  Lab 09/11/21 1210 09/11/21 1617 09/11/21 2321 09/12/21 0558 09/12/21 0728  GLUCAP 142* 175* 364* 179* 182*   Lipid Profile: No results for input(s): CHOL, HDL, LDLCALC, TRIG, CHOLHDL, LDLDIRECT in the last 72 hours. Thyroid Function Tests: No results for input(s): TSH, T4TOTAL, FREET4, T3FREE, THYROIDAB in the last 72 hours. Anemia Panel: No results for input(s): VITAMINB12, FOLATE, FERRITIN, TIBC, IRON, RETICCTPCT in the last 72 hours. Sepsis Labs: No results for input(s): PROCALCITON,  LATICACIDVEN in the last 168 hours.  Recent Results (from the past 240 hour(s))  Urine Culture     Status: Abnormal   Collection Time: 09/10/21  6:27 PM   Specimen: Urine, Catheterized  Result Value Ref Range Status   Specimen Description   Final    URINE, CATHETERIZED Performed at [facility], [address], [place], [place] [address]    Special Requests   Final    NONE Performed at [facility], [address] [place], [place] [address]    Culture MULTIPLE SPECIES PRESENT, SUGGEST RECOLLECTION (A)  Final   Report Status 09/11/2021 FINAL  Final  Resp Panel by RT-PCR (Flu A&B, Covid) Nasopharyngeal Swab     Status: None   Collection Time: 09/10/21  6:40 PM   Specimen: Nasopharyngeal Swab; Nasopharyngeal(NP) swabs in vial transport medium  Result Value Ref Range Status   SARS Coronavirus 2 by RT PCR NEGATIVE NEGATIVE Final    Comment: (NOTE)  SARS-CoV-2 target nucleic acids are NOT DETECTED.  The SARS-CoV-2 RNA is generally detectable in upper respiratory specimens during the acute phase of infection. The lowest concentration of SARS-CoV-2 viral copies this assay can detect is 138 copies/mL. A negative result does not preclude SARS-Cov-2 infection and should not be used as the sole basis for treatment or other patient management decisions. A negative result may occur with  improper specimen collection/handling, submission of specimen other than nasopharyngeal swab, presence of viral mutation(s) within the areas targeted by this assay, and inadequate number of viral copies(<138 copies/mL). A negative result must be combined with clinical observations, patient history, and epidemiological information. The expected result is Negative.  Fact Sheet for Patients:  EntrepreneurPulse.com.au  Fact Sheet for Healthcare Providers:  IncredibleEmployment.be  This test is no t yet approved or cleared by the [place] FDA and  has been authorized  for detection and/or diagnosis of SARS-CoV-2 by FDA under an Emergency Use Authorization (EUA). This EUA will remain  in effect (meaning this test can be used) for the duration of the COVID-19 declaration under Section 564(b)(1) of the Act, 21 U.S.C.section 360bbb-3(b)(1), unless the authorization is terminated  or revoked sooner.       Influenza A by PCR NEGATIVE NEGATIVE Final   Influenza B by PCR NEGATIVE NEGATIVE Final    Comment: (NOTE) The Xpert Xpress SARS-CoV-2/FLU/RSV plus assay is intended as an aid in the diagnosis of influenza from Nasopharyngeal swab specimens and should not be used as a sole basis for treatment. Nasal washings and aspirates are unacceptable for Xpert Xpress SARS-CoV-2/FLU/RSV testing.  Fact Sheet for Patients: EntrepreneurPulse.com.au  Fact Sheet for Healthcare Providers: IncredibleEmployment.be  This test is not yet approved or cleared by the [place] FDA and has been authorized for detection and/or diagnosis of SARS-CoV-2 by FDA under an Emergency Use Authorization (EUA). This EUA will remain in effect (meaning this test can be used) for the duration of the COVID-19 declaration under Section 564(b)(1) of the Act, 21 U.S.C. section 360bbb-3(b)(1), unless the authorization is terminated or revoked.  Performed at [facility], [address], [place], [place] [address]          Radiology Studies: DG Wrist Complete Left  Result Date: 09/10/2021 CLINICAL DATA:  Fall. EXAM: LEFT WRIST - COMPLETE 3+ VIEW; LEFT HAND - COMPLETE 3+ VIEW COMPARISON:  None. FINDINGS: No acute fracture or dislocation. Joint spaces are preserved. Bone mineralization is normal. Soft tissue swelling along the radial aspect of the wrist and thumb. IMPRESSION: 1. Soft tissue swelling. No acute osseous abnormality. Electronically Signed   By: Titus Dubin M.D.   On: 09/10/2021 16:45   CT Head Wo Contrast  Result Date:  09/10/2021 CLINICAL DATA:  Patient status post fall. EXAM: CT HEAD WITHOUT CONTRAST CT MAXILLOFACIAL WITHOUT CONTRAST CT CERVICAL SPINE WITHOUT CONTRAST TECHNIQUE: Multidetector CT imaging of the head, cervical spine, and maxillofacial structures were performed using the standard protocol without intravenous contrast. Multiplanar CT image reconstructions of the cervical spine and maxillofacial structures were also generated. COMPARISON:  None. FINDINGS: CT HEAD FINDINGS Brain: Ventricles and sulci are appropriate for patient's age. No evidence for acute cortically based infarct, intracranial hemorrhage, mass lesion or mass-effect. Vascular: Unremarkable. Skull: Intact. Other: None. CT MAXILLOFACIAL FINDINGS Osseous: No fracture or mandibular dislocation. No destructive process. Orbits: Unremarkable. Sinuses: Unremarkable. Soft tissues: Negative. CT CERVICAL SPINE FINDINGS Alignment: Normal anatomic alignment. Skull base and vertebrae: Intact. Soft tissues and spinal canal: No prevertebral fluid or swelling. No visible  canal hematoma. Disc levels: Degenerative disc disease most pronounced C4-5 with posterior disc osteophyte complex narrowing the canal. No acute fracture. Upper chest: Unremarkable. Other: None. IMPRESSION: No acute intracranial process. No maxillofacial fracture. No acute cervical spine fracture. Electronically Signed   By: Lovey Newcomer M.D.   On: 09/10/2021 16:38   CT Cervical Spine Wo Contrast  Result Date: 09/10/2021 CLINICAL DATA:  Patient status post fall. EXAM: CT HEAD WITHOUT CONTRAST CT MAXILLOFACIAL WITHOUT CONTRAST CT CERVICAL SPINE WITHOUT CONTRAST TECHNIQUE: Multidetector CT imaging of the head, cervical spine, and maxillofacial structures were performed using the standard protocol without intravenous contrast. Multiplanar CT image reconstructions of the cervical spine and maxillofacial structures were also generated. COMPARISON:  None. FINDINGS: CT HEAD FINDINGS Brain: Ventricles and  sulci are appropriate for patient's age. No evidence for acute cortically based infarct, intracranial hemorrhage, mass lesion or mass-effect. Vascular: Unremarkable. Skull: Intact. Other: None. CT MAXILLOFACIAL FINDINGS Osseous: No fracture or mandibular dislocation. No destructive process. Orbits: Unremarkable. Sinuses: Unremarkable. Soft tissues: Negative. CT CERVICAL SPINE FINDINGS Alignment: Normal anatomic alignment. Skull base and vertebrae: Intact. Soft tissues and spinal canal: No prevertebral fluid or swelling. No visible canal hematoma. Disc levels: Degenerative disc disease most pronounced C4-5 with posterior disc osteophyte complex narrowing the canal. No acute fracture. Upper chest: Unremarkable. Other: None. IMPRESSION: No acute intracranial process. No maxillofacial fracture. No acute cervical spine fracture. Electronically Signed   By: Lovey Newcomer M.D.   On: 09/10/2021 16:38   DG Hand Complete Left  Result Date: 09/10/2021 CLINICAL DATA:  Fall. EXAM: LEFT WRIST - COMPLETE 3+ VIEW; LEFT HAND - COMPLETE 3+ VIEW COMPARISON:  None. FINDINGS: No acute fracture or dislocation. Joint spaces are preserved. Bone mineralization is normal. Soft tissue swelling along the radial aspect of the wrist and thumb. IMPRESSION: 1. Soft tissue swelling. No acute osseous abnormality. Electronically Signed   By: Titus Dubin M.D.   On: 09/10/2021 16:45   DG Hip Unilat W or Wo Pelvis 2-3 Views Left  Result Date: 09/10/2021 CLINICAL DATA:  [place] at home today. Patient was found down. Tripped on her LEFT leg. EXAM: DG HIP (WITH OR WITHOUT PELVIS) 2-3V LEFT COMPARISON:  09/09/2021 FINDINGS: There is no evidence of hip fracture or dislocation. There is no evidence of arthropathy or other focal bone abnormality. IMPRESSION: Negative. Electronically Signed   By: Nolon Nations M.D.   On: 09/10/2021 16:43   CT Maxillofacial Wo Contrast  Result Date: 09/10/2021 CLINICAL DATA:  Patient status post fall. EXAM: CT HEAD  WITHOUT CONTRAST CT MAXILLOFACIAL WITHOUT CONTRAST CT CERVICAL SPINE WITHOUT CONTRAST TECHNIQUE: Multidetector CT imaging of the head, cervical spine, and maxillofacial structures were performed using the standard protocol without intravenous contrast. Multiplanar CT image reconstructions of the cervical spine and maxillofacial structures were also generated. COMPARISON:  None. FINDINGS: CT HEAD FINDINGS Brain: Ventricles and sulci are appropriate for patient's age. No evidence for acute cortically based infarct, intracranial hemorrhage, mass lesion or mass-effect. Vascular: Unremarkable. Skull: Intact. Other: None. CT MAXILLOFACIAL FINDINGS Osseous: No fracture or mandibular dislocation. No destructive process. Orbits: Unremarkable. Sinuses: Unremarkable. Soft tissues: Negative. CT CERVICAL SPINE FINDINGS Alignment: Normal anatomic alignment. Skull base and vertebrae: Intact. Soft tissues and spinal canal: No prevertebral fluid or swelling. No visible canal hematoma. Disc levels: Degenerative disc disease most pronounced C4-5 with posterior disc osteophyte complex narrowing the canal. No acute fracture. Upper chest: Unremarkable. Other: None. IMPRESSION: No acute intracranial process. No maxillofacial fracture. No acute cervical spine fracture. Electronically Signed  By: Lovey Newcomer M.D.   On: 09/10/2021 16:38        Scheduled Meds:  aspirin EC  81 mg Oral Daily   heparin  5,000 Units Subcutaneous Q8H   [START ON 09/13/2021] influenza vaccine adjuvanted  0.5 mL Intramuscular Tomorrow-1000   insulin aspart  0-15 Units Subcutaneous Q4H   losartan  100 mg Oral Daily   metoprolol tartrate  50 mg Oral BID   [START ON 09/13/2021] pneumococcal 23 valent vaccine  0.5 mL Intramuscular Tomorrow-1000   rosuvastatin  10 mg Oral Daily   Continuous Infusions:  sodium chloride 75 mL/hr at 09/12/21 0536     LOS: 1 day   Time spent= 35 mins    Chipper Koudelka Arsenio Loader, MD [facility]  If 7PM-7AM, please  contact night-coverage  09/12/2021, 8:01 AM

## 2021-09-12 NOTE — Op Note (Signed)
    Patient name: Amanda Mendez MRN: [id number] DOB: 1956-11-25 Sex: female  09/12/2021 Pre-operative Diagnosis: Leg pain Post-operative diagnosis:  Same Surgeon:  Annamarie Major Procedure Performed:  1.  Ultrasound-guided access, right femoral artery  2.  Abdominal aortogram  3.  Left leg runoff  4.  Third order catheterization  5.  Closure device, Celt  6.  Conscious sedation, 30 minutes     Indications: This is a 65 year old female with bilateral leg pain, left greater than right.  She comes in today for angiographic evaluation  Procedure:  The patient was identified in the holding area and taken to room 8.  The patient was then placed supine on the table and prepped and draped in the usual sterile fashion.  A time out was called.  Conscious sedation was administered with the use of IV fentanyl and Versed under continuous physician and nurse monitoring.  Heart rate, blood pressure, and oxygen saturation were continuously monitored.  Total sedation time was 30 minutes.  Ultrasound was used to evaluate the right common femoral artery.  It was patent .  A digital ultrasound image was acquired.  An 18-gauge needle was used to cannulate the right common femoral artery under ultrasound guidance.  An 035 wire was inserted without resistance.  A 5 French sheath was placed.  A Omni Flush catheter was advanced to the level of L1 and abdominal aortogram was performed.  Next using the Omni Flush catheter and a Bentson wire, the aortic bifurcation was crossed and the cath was placed in the left external iliac artery for left leg runoff.  I then selected the Berenstein 2 catheter and placed this into the below-knee popliteal artery for better tibial images. Findings:   Aortogram: No significant renal artery stenosis was identified.  The infrarenal abdominal aorta is widely patent.  Bilateral common and external iliac arteries are widely patent.  Right Lower Extremity: Not evaluated  Left Lower Extremity:  The left common femoral and profundofemoral artery are widely patent.  The superficial femoral artery and popliteal artery are widely patent.  There is a focal lesion within the origin of the anterior tibial artery, greater than 80% however the anterior tibial artery does not opacify past the ankle.  The posterior tibial artery is the dominant vessel across the ankle.  There are no significant stenoses within the posterior tibial artery.  Intervention: None.  A Celt device was used for closure without complication  Impression:  #1  No significant aortoiliac occlusive disease  #2  No significant outflow disease  #3  The patient has single-vessel runoff via the posterior tibial artery which does not have any significant stenosis identified.  The anterior tibial artery is patent and visualized out of the ankle with a proximal stenosis  #4  Suspect that the patient's pain is secondary to neuropathy or from microvascular disease.    Theotis Burrow, M.D., [facility] [place] Office: [phone] Pager:  [phone]

## 2021-09-12 NOTE — Consult Note (Signed)
Vascular and Vein Specialist of [facility]  Patient name: Amanda Mendez MRN: [id number] DOB: September 16, 1956 Sex: female   REQUESTING PROVIDER:   ER   REASON FOR CONSULT:    Leg pain  HISTORY OF PRESENT ILLNESS:   Amanda Mendez is a 65 y.o. female, who presented to the emergency department at [facility] after a fall with head trauma.  She had a large laceration.  Her head CT was negative for bleed.  The patient states that her left leg gave way secondary to pain.  She complains of pain in both legs, the left is more severe.  She describes it as pain at rest.  This does affect her walking ability.  She does not have any active ulcerations.  The patient suffers from coronary artery disease and status post PCI.  She is on a statin for hypercholesterolemia.  She is medically managed for hypertension.  She is a type II diabetic.  She is a former smoker  PAST MEDICAL HISTORY    Past Medical History:  Diagnosis Date   Anginal pain ([place])    Coronary artery disease    angioplasty 9/13   Exertional dyspnea    "usually; today it was with nothing" (11/03/2012)   GERD (gastroesophageal reflux disease)    Hypercholesterolemia with hypertriglyceridemia    Hypertension    Kidney stones    "I've had them 5 times; lithotripsy 1st time; flushed out  all the others" (11/03/2012)   Obesity (BMI 30-39.9)    Pneumonia 2012   Sinus headache    "weekly" (11/03/2012)   Type II diabetes mellitus ([place]) 2007     FAMILY HISTORY   Family History  Problem Relation Age of Onset   Heart failure Mother        Died in her 41s, had angina starting in her 23s   Crohn's disease Brother     SOCIAL HISTORY:   Social History   Socioeconomic History   Marital status: Married    Spouse name: Not on file   Number of children: Not on file   Years of education: Not on file   Highest education level: Not on file  Occupational History   Not on file  Tobacco Use    Smoking status: Former    Packs/day: 1.50    Years: 37.00    Pack years: 55.50    Types: Cigarettes    Quit date: 06/11/2012    Years since quitting: 9.2   Smokeless tobacco: Never  Substance and Sexual Activity   Alcohol use: Never    Comment: 11/03/2012 "aien't drank none in ~ 10 yr; then it was just an occasional daquiri on vacation"   Drug use: Never   Sexual activity: Not Currently  Other Topics Concern   Not on file  Social History Narrative   Not on file   Social Determinants of Health   Financial Resource Strain: Not on file  Food Insecurity: Not on file  Transportation Needs: Not on file  Physical Activity: Not on file  Stress: Not on file  Social Connections: Not on file  Intimate Partner Violence: Not on file    ALLERGIES:    Allergies  Allergen Reactions   Dapagliflozin Itching   Codeine Nausea And Vomiting    But has tolerated morphine    CURRENT MEDICATIONS:    Current Facility-Administered Medications  Medication Dose Route Frequency Provider Last Rate Last Admin   acetaminophen (TYLENOL) tablet 650 mg  650 mg Oral Q6H PRN Emokpae, Ejiroghene  E, MD   650 mg at 09/11/21 2328   Or   acetaminophen (TYLENOL) suppository 650 mg  650 mg Rectal Q6H PRN Emokpae, Ejiroghene E, MD       aspirin EC tablet 81 mg  81 mg Oral Daily Emokpae, Ejiroghene E, MD   81 mg at 09/12/21 0936   cloNIDine (CATAPRES) tablet 0.1 mg  0.1 mg Oral BID Amin, Ankit Chirag, MD   0.1 mg at 09/12/21 0936   dextrose 5 %-0.45 % sodium chloride infusion   Intravenous Continuous Amin, Ankit Chirag, MD       gabapentin (NEURONTIN) capsule 300 mg  300 mg Oral TID Damita Lack, MD   300 mg at 09/12/21 0936   heparin injection 5,000 Units  5,000 Units Subcutaneous Q8H Emokpae, Ejiroghene E, MD   5,000 Units at 09/12/21 0540   hydrALAZINE (APRESOLINE) injection 10 mg  10 mg Intravenous Q4H PRN Amin, Ankit Chirag, MD       HYDROmorphone (DILAUDID) injection 0.5-1 mg  0.5-1 mg Intravenous  Q4H PRN Johnson, Clanford L, MD   1 mg at 09/12/21 0937   [START ON 09/13/2021] influenza vaccine adjuvanted (FLUAD) injection 0.5 mL  0.5 mL Intramuscular Tomorrow-1000 Johnson, Clanford L, MD       insulin aspart (novoLOG) injection 0-15 Units  0-15 Units Subcutaneous Q4H Emokpae, Ejiroghene E, MD   3 Units at 09/12/21 0647   ipratropium-albuterol (DUONEB) 0.5-2.5 (3) MG/3ML nebulizer solution 3 mL  3 mL Nebulization Q4H PRN Amin, Ankit Chirag, MD       losartan (COZAAR) tablet 100 mg  100 mg Oral Daily Emokpae, Ejiroghene E, MD   100 mg at 09/12/21 0936   metoprolol tartrate (LOPRESSOR) injection 5 mg  5 mg Intravenous Q4H PRN Amin, Ankit Chirag, MD       metoprolol tartrate (LOPRESSOR) tablet 50 mg  50 mg Oral BID Emokpae, Ejiroghene E, MD   50 mg at 09/12/21 0936   ondansetron (ZOFRAN) tablet 4 mg  4 mg Oral Q6H PRN Emokpae, Ejiroghene E, MD       Or   ondansetron (ZOFRAN) injection 4 mg  4 mg Intravenous Q6H PRN Emokpae, Ejiroghene E, MD   4 mg at 09/11/21 1608   oxyCODONE (Oxy IR/ROXICODONE) immediate release tablet 5 mg  5 mg Oral Q4H PRN Amin, Ankit Chirag, MD       pantoprazole (PROTONIX) EC tablet 40 mg  40 mg Oral Daily Amin, Ankit Chirag, MD   40 mg at 09/12/21 0936   [START ON 09/13/2021] pneumococcal 23 valent vaccine (PNEUMOVAX-23) injection 0.5 mL  0.5 mL Intramuscular Tomorrow-1000 Johnson, Clanford L, MD       polyethylene glycol (MIRALAX / GLYCOLAX) packet 17 g  17 g Oral Daily PRN Emokpae, Ejiroghene E, MD       rosuvastatin (CRESTOR) tablet 10 mg  10 mg Oral Daily Emokpae, Ejiroghene E, MD   10 mg at 09/12/21 0936   senna-docusate (Senokot-S) tablet 1 tablet  1 tablet Oral QHS PRN Amin, Ankit Chirag, MD        REVIEW OF SYSTEMS:   [X]  denotes positive finding, [ ]  denotes negative finding Cardiac  Comments:  Chest pain or chest pressure:    Shortness of breath upon exertion:    Short of breath when lying flat:    Irregular heart rhythm:        Vascular    Pain in calf,  thigh, or hip brought on by ambulation: x   Pain in  feet at night that wakes you up from your sleep:     Blood clot in your veins:    Leg swelling:         Pulmonary    Oxygen at home:    Productive cough:     Wheezing:         Neurologic    Sudden weakness in arms or legs:     Sudden numbness in arms or legs:     Sudden onset of difficulty speaking or slurred speech:    Temporary loss of vision in one eye:     Problems with dizziness:         Gastrointestinal    Blood in stool:      Vomited blood:         Genitourinary    Burning when urinating:     Blood in urine:        Psychiatric    Major depression:         Hematologic    Bleeding problems:    Problems with blood clotting too easily:        Skin    Rashes or ulcers:        Constitutional    Fever or chills:     PHYSICAL EXAM:   Vitals:   09/11/21 1800 09/11/21 1951 09/11/21 2355 09/12/21 0439  BP: (!) 132/95 139/67 127/65 135/67  Pulse:  83 83 70  Resp: 18 20 18 18   Temp:  98.5 F (36.9 C) 98 F (36.7 C) 98.1 F (36.7 C)  TempSrc:  Oral Oral Oral  SpO2: 94% 96% 92% 93%  Weight:    77.1 kg  Height:        GENERAL: The patient is a well-nourished female, in no acute distress. The vital signs are documented above. CARDIAC: There is a regular rate and rhythm.  VASCULAR: Palpable femoral pulses.  Pedal pulses not palpable.  She does have a dusky appearance of all of her toes. PULMONARY: Nonlabored respirations ABDOMEN: Soft and non-tender with normal pitched bowel sounds.  MUSCULOSKELETAL: There are no major deformities or cyanosis. NEUROLOGIC: No focal weakness or paresthesias are detected. SKIN: There are no ulcers or rashes noted. PSYCHIATRIC: The patient has a normal affect.  STUDIES:   ABIs from several months ago showed dampened waveforms but normal numbers.  ASSESSMENT and PLAN   Bilateral leg pain, left greater than right.  I discussed with the patient that I suspect her discomfort in  her legs is multifactorial given that she describes a burning sensation as well as just pain.  Regardless I think there is an arterial component.  We discussed proceeding with angiography today to define her anatomy and intervene if indicated.  This will be from a right femoral approach.  She understands that this may not completely resolve her symptoms and that if she does not have options for percutaneous intervention, we may need to consider surgical intervention.  All of her questions were answered today.   Leia Alf, MD, FACS [facility] and Vein Specialists of [facility] [phone] Pager [phone]

## 2021-09-12 NOTE — Consult Note (Signed)
   [facility] CM Inpatient Consult   09/12/2021  Amanda Mendez [birth year] [id number]  [place] [facility] [ACO] Patient: [name] [employer]   Primary Care Provider:  Redmond School, MD   Referral request from diabetes coordinator and assigned this patient will be assigned for outreach.   Patient screened for hospitalization and referral needs.  Review of patient's medical record reveals patient is in a procedure and will follow up on referral needs.    Plan:  Continue to follow progress and disposition to assess for post hospital care management needs.    For questions contact:   Natividad Brood, RN BSN [facility] Liaison  [phone] business mobile phone Toll free office [phone]  Fax number: [phone] [email or web address]

## 2021-09-12 NOTE — Progress Notes (Signed)
Inpatient Diabetes Program Recommendations  AACE/ADA: New Consensus Statement on Inpatient Glycemic Control (2015)  Target Ranges:  Prepandial:   less than 140 mg/dL      Peak postprandial:   less than 180 mg/dL (1-2 hours)      Critically ill patients:  140 - 180 mg/dL   Lab Results  Component Value Date   GLUCAP 182 (H) 09/12/2021   HGBA1C 11.8 (H) 09/10/2021    Review of Glycemic Control Results for Amanda Mendez, Amanda Mendez (MRN [id number]) as of 09/12/2021 11:23  Ref. Range 09/11/2021 23:21 09/12/2021 05:58 09/12/2021 07:28  Glucose-Capillary Latest Ref Range: 70 - 99 mg/dL 364 (H) 179 (H) 182 (H)   Diabetes history: Type 2 DM Outpatient Diabetes medications: Glipizide 10 mg BID Current orders for Inpatient glycemic control: Novolog 0-15 units TID  Inpatient Diabetes Program Recommendations:    Consider adding Semglee 12 units QD. Will see when appropriate.  Ordered insulin starter kit, outpt edu, and [facility] consult.   Thanks, Bronson Curb, MSN, RNC-OB Diabetes Coordinator [phone] (8a-5p)

## 2021-09-12 NOTE — Evaluation (Signed)
Physical Therapy Evaluation Patient Details Name: Amanda Mendez MRN: [id number] DOB: [birth year] Today's Date: 09/12/2021  History of Present Illness  65 y.o. female presented to the ED 09/10/21 with c/o a fall after LLE gave way due to chronic pain while getting up from a seated position, with resultant trauma to her head, sustaining lacerations. Pt down for approx 5 hours before family found her, reports 2 bouts of LoC.  Head, cervical pelvic and left wrist xray all without fracture or acute abnormality. No acute abnormalities in head CT or pelvic, L wrist and hand x-ray. Poor blood flow found to bilateral LE R>L. PMH: DM, HTN, CAD, Diastolic CHF.  Clinical Impression  PTA pt living with son, daughter in-law and grandson in single story home with ramped entrance. Pt reports that prior to hospitalization she was using a quad cane for ambulation due to increased LE pain with ambulation. Pt working as Pharmacist, [employer] with decreasing standing tolerance. Pt reports independence with bADLs and most iADLs. Pt is currently limited in mobility by claudication pain with LE in dependent position, decreased L LE ROM and strength due to pain, decreased sensation below bilateral knees and decreased endurance. Pt is currently mod I for bed mobility, and min guard for coming to standing and scooting along EoB. Pt refuses to bear weight through L LE, or attempt ambulation. Pt reports being very worried about having to have amputation, PT recommended that pt wait to see what the result of the angiogram are and what the doctor says. PT recommends pt return home with HHPT to improve strength, balance and mobility in home environment. PT will continue to follow acutely.       Recommendations for follow up therapy are one component of a multi-disciplinary discharge planning process, led by the attending physician.  Recommendations may be updated based on patient status, additional functional criteria and  insurance authorization.  Follow Up Recommendations Home health PT;Supervision for mobility/OOB    Equipment Recommendations  Rolling walker with 5" wheels;3in1 (PT)       Precautions / Restrictions Precautions Precautions: Fall Restrictions Weight Bearing Restrictions: No      Mobility  Bed Mobility Overal bed mobility: Needs Assistance Bed Mobility: Supine to Sit;Sit to Supine     Supine to sit: Modified independent (Device/Increase time);HOB elevated Sit to supine: Modified independent (Device/Increase time)   General bed mobility comments: able to come to longsitting easily, requires increased time and effort to scoot hips across bed and place LE on floor, reporting increased pain in dependent position, increased effort to get LE back into bed    Transfers Overall transfer level: Needs assistance Equipment used: Rolling walker (2 wheeled) Transfers: Sit to/from Stand;Lateral/Scoot Transfers Sit to Stand: Min guard        Lateral/Scoot Transfers: Min guard General transfer comment: min guard to come to standing in RW, vc for hand placement for power up and steadying, unable to place any weight on L LE due to 12/10 pain, pt sat back on bed and able to lateral scoot along side of bed with min guard to scoot hips toward HoB before returning to supine.  Ambulation/Gait             General Gait Details: deferred due to LE pain and decreased L LE weightbearing      Balance Overall balance assessment: Needs assistance Sitting-balance support: No upper extremity supported;Feet supported;Feet unsupported Sitting balance-Leahy Scale: Good     Standing balance support: Bilateral upper extremity supported  Standing balance-Leahy Scale: Poor Standing balance comment: requires B UE support due to inability to place L LE on floor                             Pertinent Vitals/Pain Pain Assessment: 0-10 Pain Score: 10-Worst pain ever Pain Location: LE L>R  with dependent position Pain Descriptors / Indicators: Throbbing;Aching;Shooting;Tingling;Numbness Pain Intervention(s): RN gave pain meds during session;Repositioned;Monitored during session;Limited activity within patient's tolerance    Home Living Family/patient expects to be discharged to:: Private residence Living Arrangements: Children (son, daughter in-law and grandson) Available Help at Discharge: Family;Available PRN/intermittently Type of Home: House Home Access: Ramped entrance     Home Layout: One level Home Equipment: Cane - quad;Shower seat - built in;Hand held shower head      Prior Function Level of Independence: Independent with assistive device(s)         Comments: used quad cane for pain with LE in dependent position, works as a Pharmacist, 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        Extremity/Trunk Assessment   Upper Extremity Assessment Upper Extremity Assessment: Overall WFL for tasks assessed    Lower Extremity Assessment Lower Extremity Assessment: RLE deficits/detail;LLE deficits/detail RLE Deficits / Details: ROM WFL strength grossly 4/5 RLE Sensation: history of peripheral neuropathy;decreased light touch (numbness and tingling below knee) LLE: Unable to fully assess due to pain LLE Sensation: decreased light touch;history of peripheral neuropathy (numbness and tingling below knee) LLE Coordination: decreased fine motor    Cervical / Trunk Assessment Cervical / Trunk Assessment: Kyphotic  Communication   Communication: No difficulties  Cognition Arousal/Alertness: Awake/alert Behavior During Therapy: WFL for tasks assessed/performed;Anxious (worried about possibility for amputation) Overall Cognitive Status: Within Functional Limits for tasks assessed                                        General Comments General comments (skin integrity, edema, etc.): VSS on RA, contusion and laceration to L scalp, scrape and brusing on L LE         Assessment/Plan    PT Assessment Patient needs continued PT services  PT Problem List Decreased strength;Decreased range of motion;Decreased activity tolerance;Decreased balance;Decreased mobility;Decreased coordination;Impaired sensation;Pain       PT Treatment Interventions DME instruction;Gait training;Functional mobility training;Therapeutic activities;Balance training;Therapeutic exercise;Cognitive remediation;Patient/family education    PT Goals (Current goals can be found in the Care Plan section)  Acute Rehab PT Goals Patient Stated Goal: have less pain in LE PT Goal Formulation: With patient Time For Goal Achievement: 09/26/21 Potential to Achieve Goals: Good    Frequency Min 3X/week    AM-PAC PT "6 Clicks" Mobility  Outcome Measure Help needed turning from your back to your side while in a flat bed without using bedrails?: None Help needed moving from lying on your back to sitting on the side of a flat bed without using bedrails?: None Help needed moving to and from a bed to a chair (including a wheelchair)?: A Lot Help needed standing up from a chair using your arms (e.g., wheelchair or bedside chair)?: A Little Help needed to walk in hospital room?: Total Help needed climbing 3-5 steps with a railing? : Total 6 Click Score: 15    End of Session   Activity Tolerance: Patient limited by pain Patient left: in bed;with call bell/phone within reach;with bed alarm  set Nurse Communication: Mobility status PT Visit Diagnosis: Unsteadiness on feet (R26.81);Other abnormalities of gait and mobility (R26.89);Muscle weakness (generalized) (M62.81);Repeated falls (R29.6);History of falling (Z91.81);Pain Pain - Right/Left:  (bilateral L>R) Pain - part of body:  (LE)    Time: 3167-4255 PT Time Calculation (min) (ACUTE ONLY): 14 min   Charges:   PT Evaluation $PT Eval Moderate Complexity: 1 Mod          Babyboy Loya B. Migdalia Dk PT, DPT Acute Rehabilitation  Services Pager [phone] Office [phone]   [place] 09/12/2021, 10:17 AM

## 2021-09-13 ENCOUNTER — Inpatient Hospital Stay (HOSPITAL_COMMUNITY): Payer: BC Managed Care – PPO

## 2021-09-13 DIAGNOSIS — M79605 Pain in left leg: Secondary | ICD-10-CM | POA: Diagnosis not present

## 2021-09-13 DIAGNOSIS — I2510 Atherosclerotic heart disease of native coronary artery without angina pectoris: Secondary | ICD-10-CM | POA: Diagnosis not present

## 2021-09-13 DIAGNOSIS — M79604 Pain in right leg: Secondary | ICD-10-CM | POA: Diagnosis not present

## 2021-09-13 DIAGNOSIS — W19XXXD Unspecified fall, subsequent encounter: Secondary | ICD-10-CM | POA: Diagnosis not present

## 2021-09-13 DIAGNOSIS — E1369 Other specified diabetes mellitus with other specified complication: Secondary | ICD-10-CM | POA: Diagnosis not present

## 2021-09-13 LAB — CBC
HCT: 36.6 % (ref 36.0–46.0)
Hemoglobin: 11.9 g/dL — ABNORMAL LOW (ref 12.0–15.0)
MCH: 31.1 pg (ref 26.0–34.0)
MCHC: 32.5 g/dL (ref 30.0–36.0)
MCV: 95.6 fL (ref 80.0–100.0)
Platelets: 233 10*3/uL (ref 150–400)
RBC: 3.83 MIL/uL — ABNORMAL LOW (ref 3.87–5.11)
RDW: 13 % (ref 11.5–15.5)
WBC: 15.5 10*3/uL — ABNORMAL HIGH (ref 4.0–10.5)
nRBC: 0 % (ref 0.0–0.2)

## 2021-09-13 LAB — BASIC METABOLIC PANEL
Anion gap: 9 (ref 5–15)
BUN: 18 mg/dL (ref 8–23)
CO2: 23 mmol/L (ref 22–32)
Calcium: 8.7 mg/dL — ABNORMAL LOW (ref 8.9–10.3)
Chloride: 104 mmol/L (ref 98–111)
Creatinine, Ser: 1.24 mg/dL — ABNORMAL HIGH (ref 0.44–1.00)
GFR, Estimated: 48 mL/min — ABNORMAL LOW (ref >60)
Glucose, Bld: 218 mg/dL — ABNORMAL HIGH (ref 70–99)
Potassium: 4.6 mmol/L (ref 3.5–5.1)
Sodium: 136 mmol/L (ref 135–145)

## 2021-09-13 LAB — GLUCOSE, CAPILLARY
Glucose-Capillary: 205 mg/dL — ABNORMAL HIGH (ref 70–99)
Glucose-Capillary: 216 mg/dL — ABNORMAL HIGH (ref 70–99)
Glucose-Capillary: 414 mg/dL — ABNORMAL HIGH (ref 70–99)
Glucose-Capillary: 330 mg/dL — ABNORMAL HIGH (ref 70–99)

## 2021-09-13 LAB — MAGNESIUM: Magnesium: 1.7 mg/dL (ref 1.7–2.4)

## 2021-09-13 IMAGING — MR MR LUMBAR SPINE W/O CM
4 of 5 series · 19 of 48 positions shown · non-contrast
Comparison: Radiographs [DATE].

CLINICAL DATA: Low back pain.

EXAM:
MRI LUMBAR SPINE WITHOUT CONTRAST
TECHNIQUE: Multiplanar, multisequence MR imaging of the lumbar spine was
performed. No intravenous contrast was administered.

[Series 3: T2 · sagittal · 4.0mm · 0.55mm/px · 6 of 13 slices shown (1 of 2)]
[im 1/13]
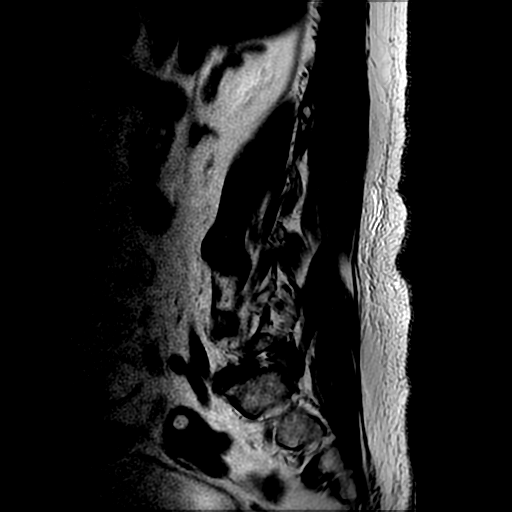
[im 3/13]
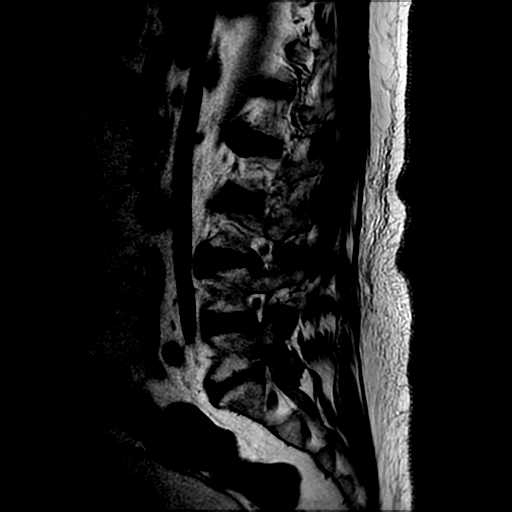
[im 5/13]
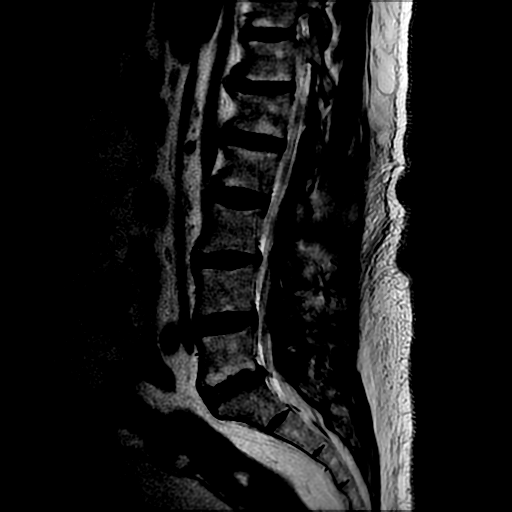
[im 8/13]
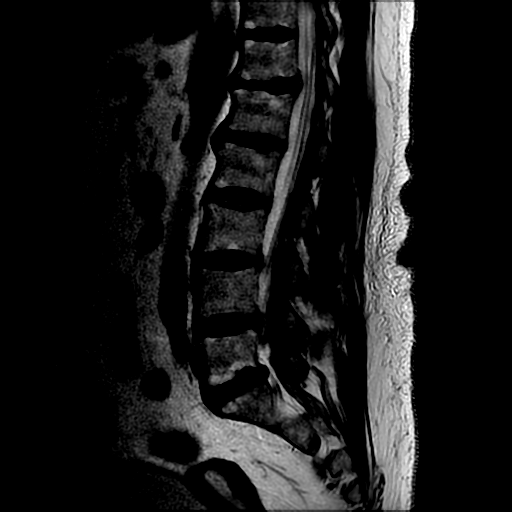
[im 10/13]
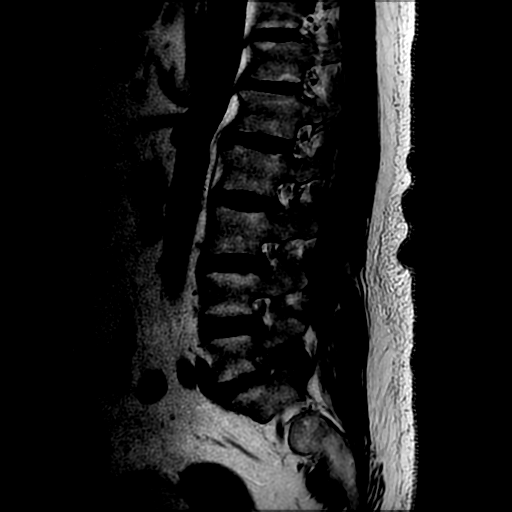
[im 13/13]
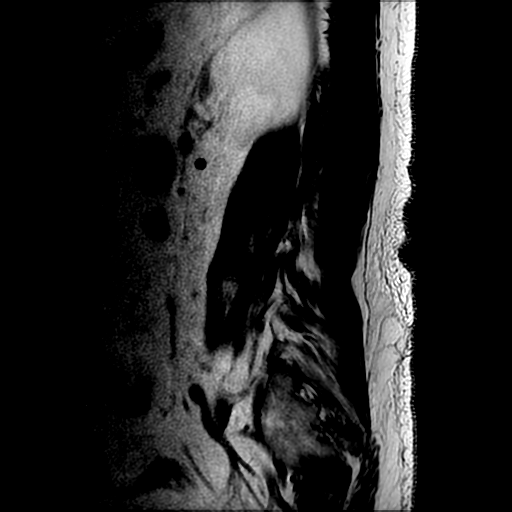

[Series 4: T1 · sagittal · 4.0mm · 0.55mm/px · 3 of 13 slices shown (1 of 2)]
[im 1/13]
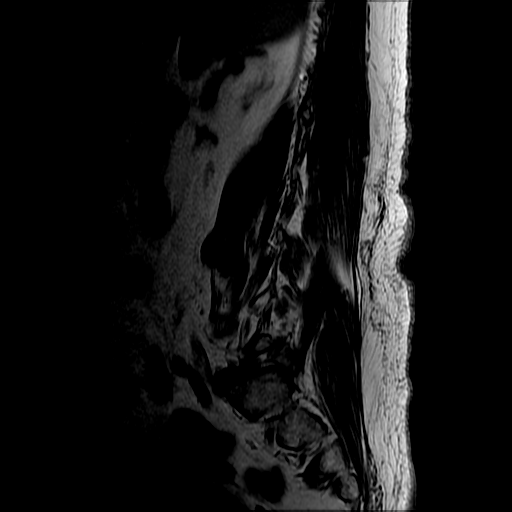
[im 7/13]
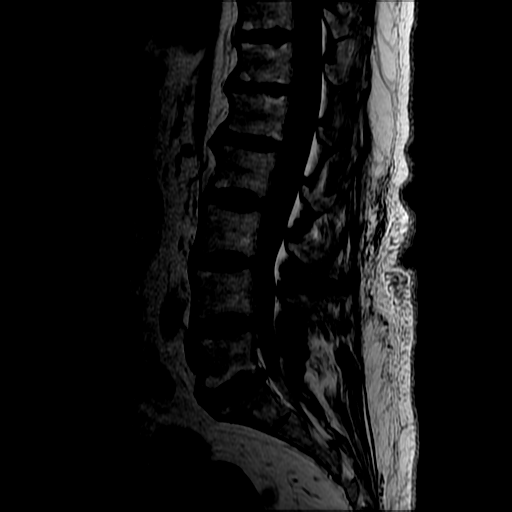
[im 13/13]
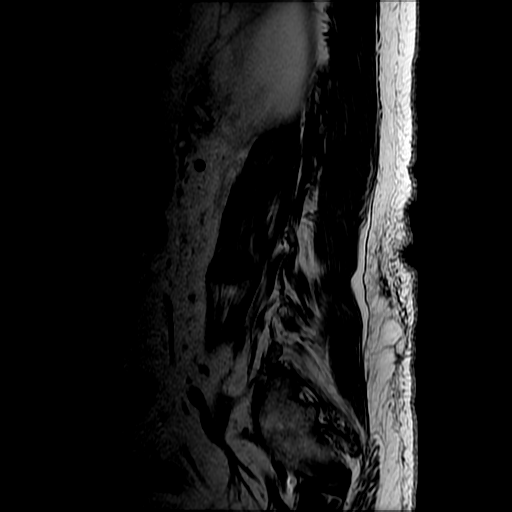

[Series 6: T2 · axial · 4.0mm · 0.43mm/px · z∈[-27,+144]mm · 7 of 38 slices shown (2 of 2)]
[im 3/38]
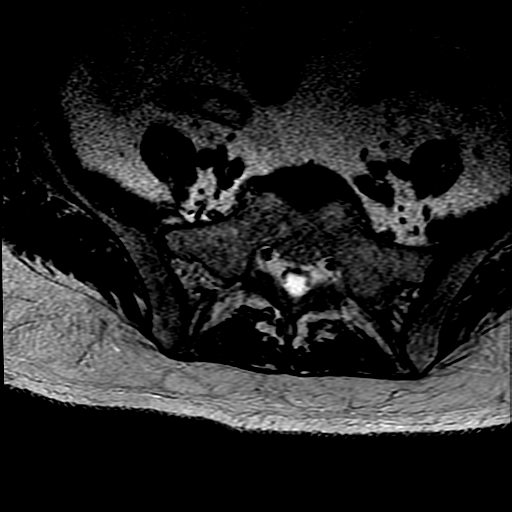
[im 5/38]
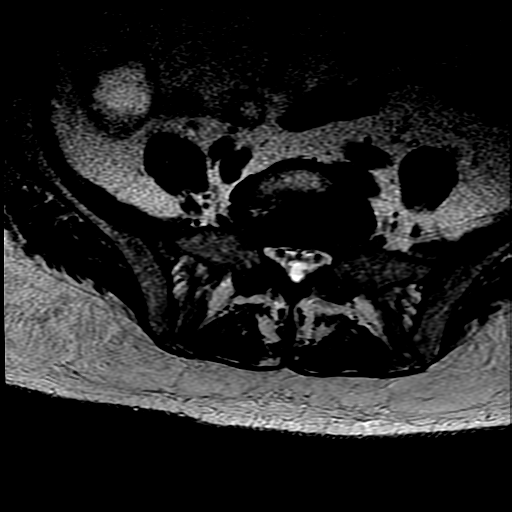
[im 8/38]
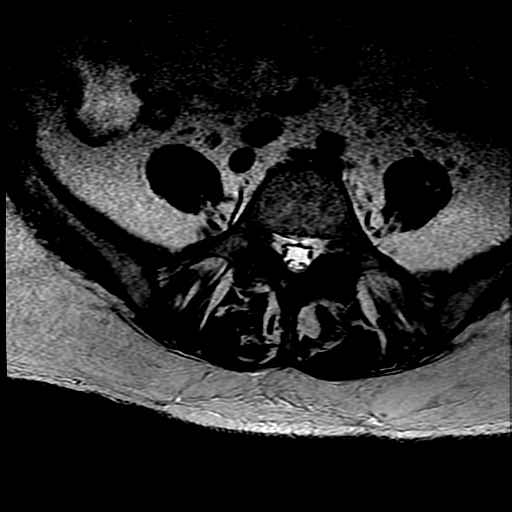
[im 13/38]
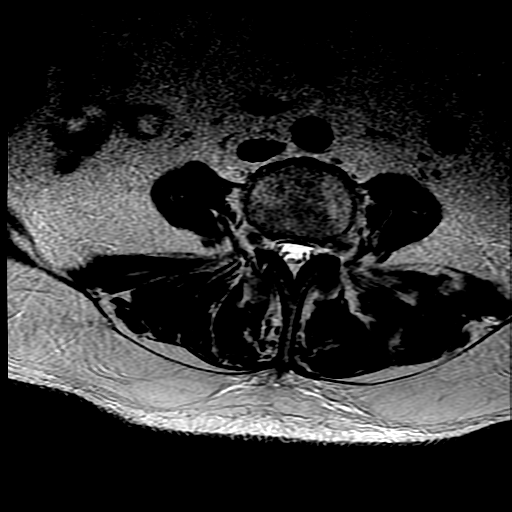
[im 18/38]
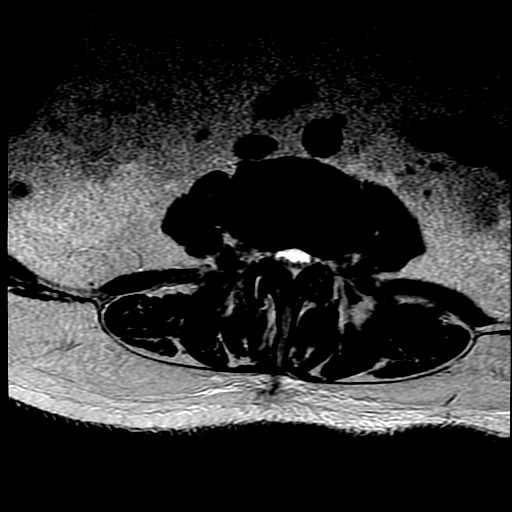
[im 20/38]
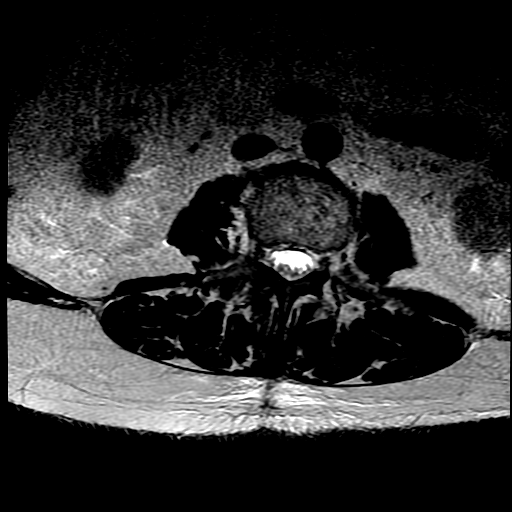
[im 33/38]
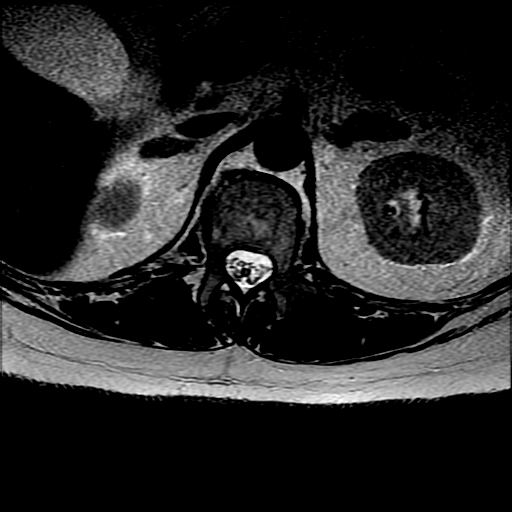

[Series 7: T1 · axial · 4.0mm · 0.41mm/px · z∈[-17,+143]mm · 3 of 38 slices shown (2 of 2)]
[im 5/38]
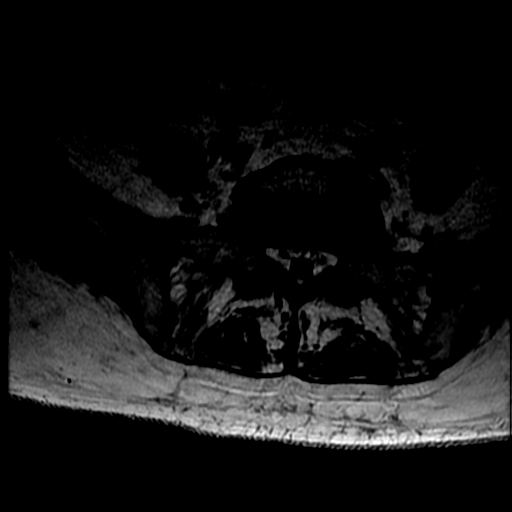
[im 20/38]
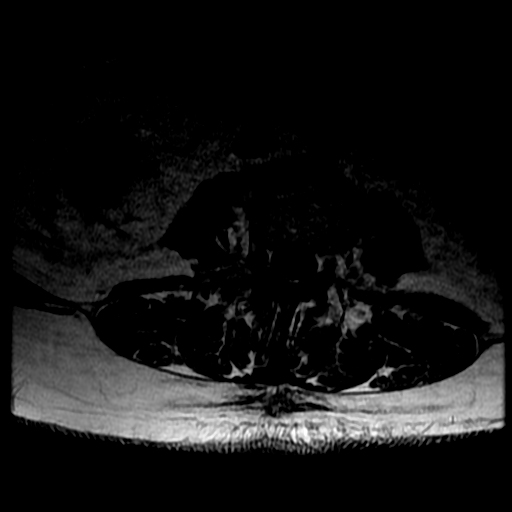
[im 33/38]
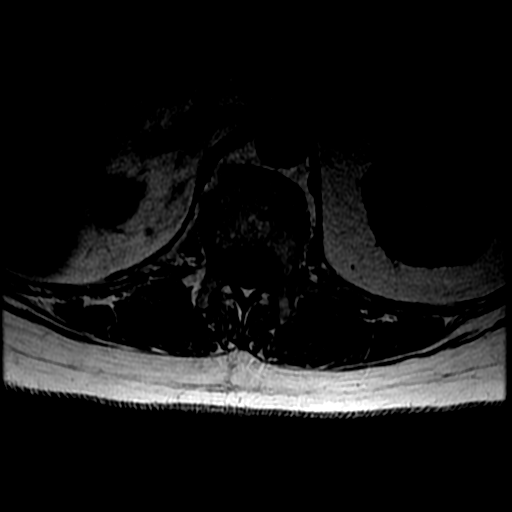

[19 of 48 positions shown; findings below may reference images not displayed]

FINDINGS: Segmentation:  Standard.

Alignment:  Physiologic.

Vertebrae: No fracture, evidence of discitis, or bone lesion.
Endplate degenerative changes at L5-S1.

Conus medullaris and cauda equina: Conus extends to the L1 level.
Conus and cauda equina appear normal.

Paraspinal and other soft tissues: Nodular thickening of the
bilateral adrenal glands as seen on prior CT of the abdomen
performed on [DATE]

Disc levels:

T12-L1: Shallow disc bulge. No significant spinal canal or neural
foraminal stenosis.

L1-2: Shallow disc bulge and mild facet degenerative changes without
significant spinal canal or neural foraminal.

L2-3: Mild facet degenerative changes. No significant spinal canal
or neural foraminal stenosis

L3-4: Disc bulge, moderate facet degenerative change ligamentum
flavum redundancy resulting in mild spinal canal stenosis and mild
bilateral neural foraminal narrowing.

L4-5: Disc bulge, mild-to-moderate facet degenerative changes
ligamentum flavum redundancy resulting in mild spinal canal stenosis
and mild bilateral neural foraminal narrowing.

L5-S1 disc bulge, mild-to-moderate facet degenerative changes and
ligamentum flavum redundancy resulting in moderate bilateral neural
foraminal narrowing, right greater than left he. No significant
spinal canal stenosis.
IMPRESSION: 1. Degenerative changes of the lumbar spine, more pronounced at
L5-S1 where there is moderate bilateral neural foraminal narrowing,
right greater than left, with possible impingement on the bilateral
exiting L5 nerve roots.
2. No high-grade spinal canal stenosis at any level.

## 2021-09-13 MED ORDER — MAGNESIUM OXIDE -MG SUPPLEMENT 400 (240 MG) MG PO TABS
800.0000 mg | ORAL_TABLET | Freq: Once | ORAL | Status: AC
  Administered 2021-09-13: 800 mg via ORAL
  Filled 2021-09-13: qty 2

## 2021-09-13 MED ORDER — DEXAMETHASONE SODIUM PHOSPHATE 10 MG/ML IJ SOLN
4.0000 mg | Freq: Two times a day (BID) | INTRAMUSCULAR | Status: DC
  Administered 2021-09-13 – 2021-09-16 (×7): 4 mg via INTRAVENOUS
  Filled 2021-09-13 (×7): qty 1

## 2021-09-13 MED ORDER — INSULIN ASPART 100 UNIT/ML IJ SOLN
3.0000 [IU] | Freq: Three times a day (TID) | INTRAMUSCULAR | Status: DC
  Administered 2021-09-13 – 2021-09-15 (×6): 3 [IU] via SUBCUTANEOUS

## 2021-09-13 NOTE — Progress Notes (Addendum)
Inpatient Diabetes Program Recommendations  AACE/ADA: New Consensus Statement on Inpatient Glycemic Control (2015)  Target Ranges:  Prepandial:   less than 140 mg/dL      Peak postprandial:   less than 180 mg/dL (1-2 hours)      Critically ill patients:  140 - 180 mg/dL   Lab Results  Component Value Date   GLUCAP 216 (H) 09/13/2021   HGBA1C 11.8 (H) 09/10/2021    Review of Glycemic Control Results for Amanda Mendez, Amanda Mendez (MRN [id number]) as of 09/13/2021 14:37  Ref. Range 09/12/2021 16:11 09/12/2021 21:11 09/13/2021 06:32 09/13/2021 11:27  Glucose-Capillary Latest Ref Range: 70 - 99 mg/dL 255 (H) 253 (H) 205 (H) 216 (H)  Diabetes history: Type 2 DM Outpatient Diabetes medications: Glipizide 10 mg BID Current orders for Inpatient glycemic control: Novolog 0-15 units TID and HS, Novolog 3 units tid with meals, Semglee 12 units daily, Decadron 4 mg IV q 12 hours  Inpatient Diabetes Program Recommendations:    Discussed current A1C of 11.8%. Patient has had recent steroids injections for back/leg that may have affected blood sugars but she states they have been high for a while.    Discussed basic pathophysiology of DM Type 2, basic home care, importance of checking CBGs and maintaining good CBG control to prevent long-term and short-term complications.  Reviewed signs and symptoms of hyperglycemia and hypoglycemia.  Patient states in the past she has not been agreeable to starting insulin with her PCP.  However she is agreeable now. Educated patient on insulin pen use at home.   Reviewed all steps of insulin pen including attachment of needle, 2-unit air shot, dialing up dose, giving injection, removing needle, disposal of sharps, storage of unused insulin, disposal of insulin etc.  Patient able to provide successful return demonstration.  Also reviewed troubleshooting with insulin pen.  MD will need to give patient Rxs for insulin pens and insulin pen needles. Also reviewed injection sites and the  importance of rotation of sites.  Patient has meter at home.  Reviewed normal blood sugar values.  She states she may be interested in CGM, however plans to discuss with Dr. Gerarda Fraction.  Will ask bedside RN to allow patient to self-administer insulin at bedside for practice as well.   At discharge please order:  Lantus Solostar pen [phone]) and insulin pen needles [phone]).  Thanks,  Adah Perl, RN, BC-ADM Inpatient Diabetes Coordinator Pager [phone]  (8a-5p)

## 2021-09-13 NOTE — Progress Notes (Addendum)
PROGRESS NOTE    Amanda Mendez  WIO:[id number] DOB: [birth year] DOA: 09/10/2021 PCP: Redmond School, MD   Brief Narrative:  65 year old with history of DM2, CAD, HTN, diastolic CHF came to the ED after sustaining a fall where she claims her legs gave out.  She reported she fell early in the morning on the day of admission but her family woke up later in the day when they checked up on her and was found to be on the floor.  In the ED she had mild leukocytosis, trauma work-up was overall negative but had diminished blood flow to the lower extremity right greater than left.  Vascular surgery was consulted and patient was transferred to [facility] for further evaluation.  She underwent aortogram on 10/4 without evidence of major blockage and did not require intervention.  She continued to have left lower extremity pain therefore MRI lumbar spine ordered along with IV steroids.   Assessment & Plan:   Principal Problem:   Fall Active Problems:   DM (diabetes mellitus) ([place])   HTN (hypertension)   CAD (coronary artery disease)   Left leg pain    PAD with concerns for critical limb ischemia LE Severe leg pain, left side - Seen by vascular team underwent aortogram and lower extremity angiogram without evidence of significant blockage.  Her pain appears to be more claudication versus sciatica?.  She also reports of some lower back pain therefore will obtain MRI lumbar spine without contrast.  Start Decadron every 12 hours and monitor her symptoms. Got MRI lumbar spine this morning which showed nerve impingement and mild disc bulge. Spoke with Dr Saintclair Halsted from [facility], who doesn't think her MRI is very poor appearing. Recommended continuing steroids for 1-2 days, watch her symptoms. If it doesn't help and continues to have trouble bearing weight, she may benefit from CT hip, knee and ortho eval.   PT/OT-recommended home health. She may eventually benefit from outpatient referral to pain  management.   CAD status post PCI with stent placement in 2013 -Patient has not been on aspirin or statin?  Plavix was apparently stopped 2 months ago.  Aspirin and statin has been restarted.  A1c is 11.8.  Check lipid panel   Type 2 Diabetes mellitus uncontrolled with vascular complications, uncontrolled with hyperglycemia -Holding home po meds.  A1c 11.8 - Sliding scale and Accu-Cheks.  Long-acting 12 units daily, may need to adjust as steroids have been started - Consult diabetic coordinator   Essential hypertension  -On metoprolol twice daily, losartan 100 mg daily   CKD stage 3a, creatinine 1.2 -Creatinine at baseline.  Received gentle hydration during hospitalization    DVT prophylaxis: SQ Heparin Code Status: Full Code Family Communication:   Status is: Inpatient  Remains inpatient appropriate because:Inpatient level of care appropriate due to severity of illness.  Still having significant pain in left lower extremity with ambulatory difficulty.  Obtaining MRI lumbar spine today and continue IV steroids.  Dispo: The patient is from: Home              Anticipated d/c is to: Home              Patient currently is not medically stable to d/c.   Difficult to place patient No    Subjective: Still having significant amount of left lower extremity pain which she describes as knife, slightly tearing down her leg.  She also has mild lower back pain and because of that she is having difficulty bearing weight  on left lower extremity.  Review of Systems Otherwise negative except as per HPI, including: General: Denies fever, chills, night sweats or unintended weight loss. Resp: Denies cough, wheezing, shortness of breath. Cardiac: Denies chest pain, palpitations, orthopnea, paroxysmal nocturnal dyspnea. GI: Denies abdominal pain, nausea, vomiting, diarrhea or constipation GU: Denies dysuria, frequency, hesitancy or incontinence MS: Left lower extremity pain Neuro: Denies  headache, neurologic deficits (focal weakness, numbness, tingling), abnormal gait Psych: Denies anxiety, depression, SI/HI/AVH Skin: Denies new rashes or lesions ID: Denies sick contacts, exotic exposures, travel  Examination:  Constitutional: Not in acute distress Respiratory: Clear to auscultation bilaterally Cardiovascular: Normal sinus rhythm, no rubs Abdomen: Nontender nondistended good bowel sounds Musculoskeletal: No edema noted Skin: No rashes seen Neurologic: CN 2-12 grossly intact.  And nonfocal Psychiatric: Normal judgment and insight. Alert and oriented x 3. Normal mood.  Objective: Vitals:   09/12/21 1212 09/12/21 1230 09/12/21 2015 09/13/21 0445  BP: (!) 145/72 137/83 (!) 120/55 120/63  Pulse: 86 84 83 86  Resp: 10 16 18 20   Temp:  99.1 F (37.3 C) 98.4 F (36.9 C) 97.9 F (36.6 C)  TempSrc:  Oral Oral Oral  SpO2: 100% 94% 97% 90%  Weight:    76.9 kg  Height:        Intake/Output Summary (Last 24 hours) at 09/13/2021 0828 Last data filed at 09/13/2021 0447 Gross per 24 hour  Intake 703.95 ml  Output 1300 ml  Net -596.05 ml   Filed Weights   09/10/21 1430 09/12/21 0439 09/13/21 0445  Weight: 74.8 kg 77.1 kg 76.9 kg     Data Reviewed:   CBC: Recent Labs  Lab 09/10/21 1647 09/11/21 0516 09/13/21 0353  WBC 14.7* 14.1* 15.5*  NEUTROABS 9.6*  --   --   HGB 14.1 13.2 11.9*  HCT 42.0 40.3 36.6  MCV 94.6 94.4 95.6  PLT 287 304 202   Basic Metabolic Panel: Recent Labs  Lab 09/10/21 1647 09/11/21 0516 09/12/21 0726 09/13/21 0353  NA 136 136 140 136  K 4.1 3.4* 4.1 4.6  CL 99 102 105 104  CO2 27 25 27 23   GLUCOSE 376* 170* 182* 218*  BUN 33* 28* 21 18  CREATININE 1.40* 1.25* 1.21* 1.24*  CALCIUM 8.8* 8.3* 8.8* 8.7*  MG  --   --   --  1.7   GFR: Estimated Creatinine Clearance: 42.8 mL/min (A) (by C-G formula based on SCr of 1.24 mg/dL (H)). Liver Function Tests: Recent Labs  Lab 09/10/21 1647  AST 18  ALT 30  ALKPHOS 91  BILITOT 0.9   PROT 7.1  ALBUMIN 3.9   No results for input(s): LIPASE, AMYLASE in the last 168 hours. No results for input(s): AMMONIA in the last 168 hours. Coagulation Profile: Recent Labs  Lab 09/10/21 1647  INR 0.9   Cardiac Enzymes: Recent Labs  Lab 09/10/21 1647  CKTOTAL 40   BNP (last 3 results) No results for input(s): PROBNP in the last 8760 hours. HbA1C: Recent Labs    09/10/21 1647  HGBA1C 11.8*   CBG: Recent Labs  Lab 09/12/21 0728 09/12/21 1228 09/12/21 1611 09/12/21 2111 09/13/21 0632  GLUCAP 182* 198* 255* 253* 205*   Lipid Profile: No results for input(s): CHOL, HDL, LDLCALC, TRIG, CHOLHDL, LDLDIRECT in the last 72 hours. Thyroid Function Tests: Recent Labs    09/12/21 0726  TSH 3.913   Anemia Panel: No results for input(s): VITAMINB12, FOLATE, FERRITIN, TIBC, IRON, RETICCTPCT in the last 72 hours. Sepsis Labs: No  results for input(s): PROCALCITON, LATICACIDVEN in the last 168 hours.  Recent Results (from the past 240 hour(s))  Urine Culture     Status: Abnormal   Collection Time: 09/10/21  6:27 PM   Specimen: Urine, Catheterized  Result Value Ref Range Status   Specimen Description   Final    URINE, CATHETERIZED Performed at [facility], [address], [place], [place] [address]    Special Requests   Final    NONE Performed at [facility], [address], [place], [place] [address]    Culture MULTIPLE SPECIES PRESENT, SUGGEST RECOLLECTION (A)  Final   Report Status 09/11/2021 FINAL  Final  Resp Panel by RT-PCR (Flu A&B, Covid) Nasopharyngeal Swab     Status: None   Collection Time: 09/10/21  6:40 PM   Specimen: Nasopharyngeal Swab; Nasopharyngeal(NP) swabs in vial transport medium  Result Value Ref Range Status   SARS Coronavirus 2 by RT PCR NEGATIVE NEGATIVE Final    Comment: (NOTE) SARS-CoV-2 target nucleic acids are NOT DETECTED.  The SARS-CoV-2 RNA is generally detectable in upper respiratory specimens during the acute phase of  infection. The lowest concentration of SARS-CoV-2 viral copies this assay can detect is 138 copies/mL. A negative result does not preclude SARS-Cov-2 infection and should not be used as the sole basis for treatment or other patient management decisions. A negative result may occur with  improper specimen collection/handling, submission of specimen other than nasopharyngeal swab, presence of viral mutation(s) within the areas targeted by this assay, and inadequate number of viral copies(<138 copies/mL). A negative result must be combined with clinical observations, patient history, and epidemiological information. The expected result is Negative.  Fact Sheet for Patients:  EntrepreneurPulse.com.au  Fact Sheet for Healthcare Providers:  IncredibleEmployment.be  This test is no t yet approved or cleared by the [place] FDA and  has been authorized for detection and/or diagnosis of SARS-CoV-2 by FDA under an Emergency Use Authorization (EUA). This EUA will remain  in effect (meaning this test can be used) for the duration of the COVID-19 declaration under Section 564(b)(1) of the Act, 21 U.S.C.section 360bbb-3(b)(1), unless the authorization is terminated  or revoked sooner.       Influenza A by PCR NEGATIVE NEGATIVE Final   Influenza B by PCR NEGATIVE NEGATIVE Final    Comment: (NOTE) The Xpert Xpress SARS-CoV-2/FLU/RSV plus assay is intended as an aid in the diagnosis of influenza from Nasopharyngeal swab specimens and should not be used as a sole basis for treatment. Nasal washings and aspirates are unacceptable for Xpert Xpress SARS-CoV-2/FLU/RSV testing.  Fact Sheet for Patients: EntrepreneurPulse.com.au  Fact Sheet for Healthcare Providers: IncredibleEmployment.be  This test is not yet approved or cleared by the [place] FDA and has been authorized for detection and/or diagnosis of SARS-CoV-2  by FDA under an Emergency Use Authorization (EUA). This EUA will remain in effect (meaning this test can be used) for the duration of the COVID-19 declaration under Section 564(b)(1) of the Act, 21 U.S.C. section 360bbb-3(b)(1), unless the authorization is terminated or revoked.  Performed at [facility], [address] [place], [place] [address]          Radiology Studies: PERIPHERAL VASCULAR CATHETERIZATION  Result Date: 09/12/2021 Images from the original result were not included. Patient name: Amanda Mendez MRN: [id number] DOB: 30-Dec-1955 Sex: female 09/12/2021 Pre-operative Diagnosis: Leg pain Post-operative diagnosis:  Same Surgeon:  Annamarie Major Procedure Performed:  1.  Ultrasound-guided access, right femoral artery  2.  Abdominal aortogram  3.  Left leg runoff  4.  Third order catheterization  5.  Closure device, Celt  6.  Conscious sedation, 30 minutes  Indications: This is a 65 year old female with bilateral leg pain, left greater than right.  She comes in today for angiographic evaluation Procedure:  The patient was identified in the holding area and taken to room 8.  The patient was then placed supine on the table and prepped and draped in the usual sterile fashion.  A time out was called.  Conscious sedation was administered with the use of IV fentanyl and Versed under continuous physician and nurse monitoring.  Heart rate, blood pressure, and oxygen saturation were continuously monitored.  Total sedation time was 30 minutes.  Ultrasound was used to evaluate the right common femoral artery.  It was patent .  A digital ultrasound image was acquired.  An 18-gauge needle was used to cannulate the right common femoral artery under ultrasound guidance.  An 035 wire was inserted without resistance.  A 5 French sheath was placed.  A Omni Flush catheter was advanced to the level of L1 and abdominal aortogram was performed.  Next using the Omni Flush catheter and a Bentson wire, the aortic  bifurcation was crossed and the cath was placed in the left external iliac artery for left leg runoff.  I then selected the Berenstein 2 catheter and placed this into the below-knee popliteal artery for better tibial images. Findings:  Aortogram: No significant renal artery stenosis was identified.  The infrarenal abdominal aorta is widely patent.  Bilateral common and external iliac arteries are widely patent.  Right Lower Extremity: Not evaluated  Left Lower Extremity: The left common femoral and profundofemoral artery are widely patent.  The superficial femoral artery and popliteal artery are widely patent.  There is a focal lesion within the origin of the anterior tibial artery, greater than 80% however the anterior tibial artery does not opacify past the ankle.  The posterior tibial artery is the dominant vessel across the ankle.  There are no significant stenoses within the posterior tibial artery. Intervention: None.  A Celt device was used for closure without complication Impression:  #1  No significant aortoiliac occlusive disease  #2  No significant outflow disease  #3  The patient has single-vessel runoff via the posterior tibial artery which does not have any significant stenosis identified.  The anterior tibial artery is patent and visualized out of the ankle with a proximal stenosis  #4  Suspect that the patient's pain is secondary to neuropathy or from microvascular disease. Theotis Burrow, M.D., FACS [facility] Office: [phone] Pager:  [phone]        Scheduled Meds:  aspirin EC  81 mg Oral Daily   cloNIDine  0.1 mg Oral BID   dexamethasone (DECADRON) injection  4 mg Intravenous Q12H   gabapentin  300 mg Oral TID   heparin  5,000 Units Subcutaneous Q8H   influenza vaccine adjuvanted  0.5 mL Intramuscular Tomorrow-1000   insulin aspart  0-15 Units Subcutaneous TID AC & HS   insulin glargine-yfgn  12 Units Subcutaneous Daily   losartan  100 mg  Oral Daily   metoprolol tartrate  50 mg Oral BID   pantoprazole  40 mg Oral Daily   pneumococcal 23 valent vaccine  0.5 mL Intramuscular Tomorrow-1000   rosuvastatin  10 mg Oral Daily   sodium chloride flush  3 mL Intravenous Q12H   Continuous Infusions:  sodium chloride  LOS: 2 days   Time spent= 35 mins    Amanda Mendez Arsenio Loader, MD [facility]  If 7PM-7AM, please contact night-coverage  09/13/2021, 8:28 AM

## 2021-09-13 NOTE — Progress Notes (Signed)
Mobility Specialist Progress Note   09/13/21 1242  Mobility  Activity Transferred:  Bed to chair  Level of Assistance Moderate assist, patient does 50-74%  Assistive Device Front wheel walker  Distance Ambulated (ft) 4 ft  Mobility Sit up in bed/chair position for meals  Mobility Response Tolerated fair  Mobility performed by Mobility specialist  Bed Position Chair  $Mobility charge 1 Mobility   Pt received in bed sitting up agreeing to transfer to chair to eat lunch. Could not bear weight at all on L foot stating pain to be 8-9/10. [place] sit < > stand, pt able to hop and pivot on R foot for transfer. Pt left w/ call bell by side and table in front.   [place] Falling Mobility Specialist Phone Number [phone]

## 2021-09-13 NOTE — Progress Notes (Signed)
    Durable Medical Equipment  (From admission, onward)           Start     Ordered   09/13/21 1615  For home use only DME Walker rolling  Once       Question Answer Comment  Walker: With [place] Wheels   Patient needs a walker to treat with the following condition Weakness      09/13/21 1614   09/13/21 1615  For home use only DME 3 n 1  Once        09/13/21 1614   09/13/21 1615  For home use only DME high strength lightweight manual wheelchair with seat cushion  Once       Comments: Patient suffers from weakness which impairs their ability to perform daily activities like bathing, dressing, and grooming in the home.  A cane or walker will not resolve  issue with performing activities of daily living. A wheelchair will allow patient to safely perform daily activities.Length of need Lifetime. (THEN ONE OF THESE TWO:) Patient self-propels the wheelchair while engaging in frequent activities such as meals and toileting which cannot be performed in a standard or lightweight wheelchair due to the weight of the chair. Accessories: elevating leg rests (ELRs), wheel locks, extensions and anti-tippers.   09/13/21 1617

## 2021-09-13 NOTE — Evaluation (Signed)
Occupational Therapy Evaluation Patient Details Name: Amanda Mendez MRN: [id number] DOB: [birth year] Today's Date: 09/13/2021   History of Present Illness 65 y.o. female presented to the ED 09/10/21 with c/o a fall after LLE gave way due to chronic pain while getting up from a seated position, with resultant trauma to her head, sustaining lacerations. Pt down for approx 5 hours before family found her, reports 2 bouts of LoC.  Head, cervical pelvic and left wrist xray all without fracture or acute abnormality. No acute abnormalities in head CT or pelvic, L wrist and hand x-ray. Poor blood flow found to bilateral LE R>L. PMH: DM, HTN, CAD, Diastolic CHF.   Clinical Impression   PTA, pt lives with family and reports Modified Independence with ADLs and mobility using quad cane (was hopping around with cane since L LE pain onset). 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 Pt presents now with significant L LE pain when attempting to weight bear. Began education in compensatory strategies for ADLs, transfers and safe use of DME to offload painful L LE. Pt overall Min A for basic pivots, Setup for UB ADLs and Min A for LB ADLs. Anticipate pt to progress well enough to return home with HHOT follow-up. Will continue to follow acutely to maximize balance, safety with DME and progression of weightbearing tolerance to decrease fall risk.      Recommendations for follow up therapy are one component of a multi-disciplinary discharge planning process, led by the attending physician.  Recommendations may be updated based on patient status, additional functional criteria and insurance authorization.   Follow Up Recommendations  Home health OT;Supervision - Intermittent    Equipment Recommendations  3 in 1 bedside commode;Wheelchair (measurements OT);Wheelchair cushion (measurements OT);Other (comment) (Rolling walker; wheelchair if unable to progress WB through L LE)    Recommendations for Other Services        Precautions / Restrictions Precautions Precautions: Fall Restrictions Weight Bearing Restrictions: No      Mobility Bed Mobility Overal bed mobility: Needs Assistance Bed Mobility: Sit to Supine       Sit to supine: Supervision   General bed mobility comments: able to bring B LE up into bed without assist    Transfers Overall transfer level: Needs assistance Equipment used: Rolling walker (2 wheeled);1 person hand held assist Transfers: Sit to/from Stand;Stand Pivot Transfers;Squat Pivot Transfers Sit to Stand: Min guard Stand pivot transfers: Min assist Squat pivot transfers: Min assist     General transfer comment: Min A for initial squat pivot to BSC from recliner chair with steadying assist. min guard for sit to stand with RW (able to TDWB with L LE at most) and Min A for stand pivot with RW back to bed. Cues to slow pace to ensure safety    Balance Overall balance assessment: Needs assistance Sitting-balance support: No upper extremity supported;Feet supported;Feet unsupported Sitting balance-Leahy Scale: Good     Standing balance support: Bilateral upper extremity supported Standing balance-Leahy Scale: Poor Standing balance comment: requires B UE support due to inability to place L LE on floor                           ADL either performed or assessed with clinical judgement   ADL Overall ADL's : Needs assistance/impaired Eating/Feeding: Independent;Sitting   Grooming: Set up;Sitting   Upper Body Bathing: Set up;Sitting   Lower Body Bathing: Minimal assistance;Sit to/from stand;Sitting/lateral leans   Upper Body Dressing :  Set up;Sitting   Lower Body Dressing: Minimal assistance;Sit to/from stand Lower Body Dressing Details (indicate cue type and reason): able to reach to pull up B socks Toilet Transfer: Minimal assistance;Squat-pivot;BSC Toilet Transfer Details (indicate cue type and reason): Min A for steadying in squat pivot,  avoiding WB through L LE due to pain. Toileting- Clothing Manipulation and Hygiene: Minimal assistance;Sitting/lateral lean;Sit to/from stand Toileting - Clothing Manipulation Details (indicate cue type and reason): able to perform hygiene without assist seated on BSC, Min A for clothing mgmt       General ADL Comments: Limited by L LE pain and inability to WB for safe LB ADL and transfer completion     Vision Baseline Vision/History: 1 Wears glasses Ability to See in Adequate Light: 0 Adequate Patient Visual Report: No change from baseline Vision Assessment?: No apparent visual deficits     Perception     Praxis      Pertinent Vitals/Pain Pain Assessment: Faces Faces Pain Scale: Hurts even more Pain Location: L LE when attempting to WB Pain Descriptors / Indicators: Grimacing;Guarding;Sharp;Sore Pain Intervention(s): Limited activity within patient's tolerance;Monitored during session     Hand Dominance Right   Extremity/Trunk Assessment Upper Extremity Assessment Upper Extremity Assessment: Overall WFL for tasks assessed   Lower Extremity Assessment Lower Extremity Assessment: Defer to PT evaluation   Cervical / Trunk Assessment Cervical / Trunk Assessment: Kyphotic   Communication Communication Communication: No difficulties   Cognition Arousal/Alertness: Awake/alert Behavior During Therapy: WFL for tasks assessed/performed;Anxious Overall Cognitive Status: Impaired/Different from baseline Area of Impairment: Attention;Awareness;Problem solving;Safety/judgement                   Current Attention Level: Sustained     Safety/Judgement: Decreased awareness of safety Awareness: Emergent Problem Solving: Difficulty sequencing;Requires verbal cues;Requires tactile cues General Comments: easily distracted with tangential conversation but pleasant and participatory. cues needed for safety and to slow down with transfers to decrease fall risk   General  Comments       Exercises     Shoulder Instructions      Home Living Family/patient expects to be discharged to:: Private residence Living Arrangements: Children Available Help at Discharge: Family;Available PRN/intermittently Type of Home: House Home Access: Ramped entrance     Home Layout: One level     Bathroom Shower/Tub: Occupational psychologist: Standard Bathroom Accessibility: Yes   Home Equipment: Cane - quad;Shower seat - built in;Hand held shower head          Prior Functioning/Environment Level of Independence: Independent with assistive device(s)        Comments: used quad cane for pain with LE in dependent position, works as a Pharmacist, 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 with [employer]        OT Problem List: Decreased strength;Impaired balance (sitting and/or standing);Decreased activity tolerance;Decreased safety awareness;Decreased knowledge of use of DME or AE;Pain      OT Treatment/Interventions: Self-care/ADL training;Therapeutic exercise;DME and/or AE instruction;Therapeutic activities;Balance training;Patient/family education    OT Goals(Current goals can be found in the care plan section) Acute Rehab OT Goals Patient Stated Goal: have less pain in LE OT Goal Formulation: With patient Time For Goal Achievement: 09/27/21 Potential to Achieve Goals: Good  OT Frequency: Min 2X/week   Barriers to D/C:            Co-evaluation              AM-PAC OT "6 Clicks" Daily Activity     Outcome Measure Help from  another person eating meals?: None Help from another person taking care of personal grooming?: A Little Help from another person toileting, which includes using toliet, bedpan, or urinal?: A Little Help from another person bathing (including washing, rinsing, drying)?: A Little Help from another person to put on and taking off regular upper body clothing?: A Little Help from another person to put on and taking off regular lower body clothing?: A  Little 6 Click Score: 19   End of Session Equipment Utilized During Treatment: Gait belt;Rolling walker Nurse Communication: Mobility status  Activity Tolerance: Patient tolerated treatment well;Patient limited by pain Patient left: in bed;with call bell/phone within reach;with bed alarm set;Other (comment) (with DM coordinator)  OT Visit Diagnosis: Other abnormalities of gait and mobility (R26.89);Unsteadiness on feet (R26.81);Muscle weakness (generalized) (M62.81);Pain Pain - Right/Left: Left Pain - part of body: Leg                Time: 8469-6295 OT Time Calculation (min): 27 min Charges:  OT General Charges $OT Visit: 1 Visit OT Evaluation $OT Eval Moderate Complexity: 1 Mod OT Treatments $Self Care/Home Management : 8-22 mins  Malachy Chamber, OTR/L Acute Rehab Services Office: [phone]   Layla Maw 09/13/2021, 2:14 PM

## 2021-09-13 NOTE — Progress Notes (Addendum)
  Progress Note    09/13/2021 7:46 AM 1 Day Post-Op  Subjective:  Still with leg pain   Vitals:   09/12/21 2015 09/13/21 0445  BP: (!) 120/55 120/63  Pulse: 83 86  Resp: 18 20  Temp: 98.4 F (36.9 C) 97.9 F (36.6 C)  SpO2: 97% 90%    Physical Exam: General appearance: Awake, alert in no apparent distress Cardiac: Heart rate and rhythm are regular Respirations: Nonlabored Incisions: Right groin soft without bleeding or hematoma Extremities: Both feet are warm with intact sensation and motor function.     CBC    Component Value Date/Time   WBC 15.5 (H) 09/13/2021 0353   RBC 3.83 (L) 09/13/2021 0353   HGB 11.9 (L) 09/13/2021 0353   HCT 36.6 09/13/2021 0353   PLT 233 09/13/2021 0353   MCV 95.6 09/13/2021 0353   MCH 31.1 09/13/2021 0353   MCHC 32.5 09/13/2021 0353   RDW 13.0 09/13/2021 0353   LYMPHSABS 2.0 09/10/2021 1647   MONOABS 0.8 09/10/2021 1647   EOSABS 2.0 (H) 09/10/2021 1647   BASOSABS 0.1 09/10/2021 1647    BMET    Component Value Date/Time   NA 136 09/13/2021 0353   K 4.6 09/13/2021 0353   CL 104 09/13/2021 0353   CO2 23 09/13/2021 0353   GLUCOSE 218 (H) 09/13/2021 0353   BUN 18 09/13/2021 0353   CREATININE 1.24 (H) 09/13/2021 0353   CREATININE 0.96 09/03/2012 1406   CALCIUM 8.7 (L) 09/13/2021 0353   GFRNONAA 48 (L) 09/13/2021 0353   GFRAA >90 05/29/2014 0450     Intake/Output Summary (Last 24 hours) at 09/13/2021 0746 Last data filed at 09/13/2021 0447 Gross per 24 hour  Intake 703.95 ml  Output 1300 ml  Net -596.05 ml    HOSPITAL MEDICATIONS Scheduled Meds:  aspirin EC  81 mg Oral Daily   cloNIDine  0.1 mg Oral BID   gabapentin  300 mg Oral TID   heparin  5,000 Units Subcutaneous Q8H   influenza vaccine adjuvanted  0.5 mL Intramuscular Tomorrow-1000   insulin aspart  0-15 Units Subcutaneous TID AC & HS   insulin glargine-yfgn  12 Units Subcutaneous Daily   losartan  100 mg Oral Daily   metoprolol tartrate  50 mg Oral BID    pantoprazole  40 mg Oral Daily   pneumococcal 23 valent vaccine  0.5 mL Intramuscular Tomorrow-1000   rosuvastatin  10 mg Oral Daily   sodium chloride flush  3 mL Intravenous Q12H   Continuous Infusions:  sodium chloride     PRN Meds:.sodium chloride, [DISCONTINUED] acetaminophen **OR** acetaminophen, acetaminophen, hydrALAZINE, HYDROmorphone (DILAUDID) injection, ipratropium-albuterol, labetalol, metoprolol tartrate, ondansetron (ZOFRAN) IV, ondansetron **OR** [DISCONTINUED] ondansetron (ZOFRAN) IV, oxyCODONE, polyethylene glycol, senna-docusate, sodium chloride flush  Assessment and Plan:  LE pain s/p aortogram with BLE run off. No intervention. Groin OK. Likely small vessel disease and/or neuropathy causing pain.  -DVT prophylaxis:  heparin   Risa Grill, PA-C [facility] [phone] 09/13/2021  7:46 AM   Nothing to do for revascularization.  I suspect her issues are from neuropathy.  Consider lower back work up  [employer]

## 2021-09-13 NOTE — TOC Transition Note (Signed)
Transition of Care [facility]) - CM/SW Discharge Note   Patient Details  Name: Amanda Mendez MRN: [id number] Date of Birth: 06/25/1956  Transition of Care [facility]) CM/SW Contact:  Zenon Mayo, RN Phone Number: 09/13/2021, 4:33 PM   Clinical Narrative:    Patient will be for dc tomorrow, NCM offered choice from Medicare.gov list for [place], HHOT.  She states she does not have a preference. She also states she does not have a preference for DME .  NCM made referral to [facility] with [facility] for [place], [place].  He is able to take referral , soc will begin 24 to 48 hrs post dc.  NCM made referral for rolling walker , 3 n 1, and light weight chair to [place] with [employer].  He will deliver this to patient room tomorrow prior to discharge.    Final next level of care: [place] Barriers to Discharge: Continued Medical Work up   Patient Goals and CMS Choice Patient states their goals for this hospitalization and ongoing recovery are:: return home with [facility] CMS Medicare.gov Compare Post Acute Care list provided to:: Patient Choice offered to / list presented to : Patient  Discharge Placement                       Discharge Plan and Services                DME Arranged: Walker rolling, 3-N-1, Youth worker wheelchair with seat cushion DME Agency: [employer] Date DME Agency Contacted: 09/13/21 Time DME Agency Contacted: [phone] Representative spoke with at DME Agency: Brenton Grills HH Arranged: PT, OT [place] Agency: [place] Date [place]: 09/13/21 Time [place]: 1633 Representative spoke with at [place]: [place] ([place]) Interventions     Readmission Risk Interventions No flowsheet data found.

## 2021-09-13 NOTE — Progress Notes (Signed)
OT Cancellation Note  Patient Details Name: Veryl Abril MRN: [id number] DOB: 25-Jan-1956   Cancelled Treatment:    Reason Eval/Treat Not Completed: Patient at procedure or test/ unavailable Coordinated with RN for premedication due to L LE pain with plans to see around 10:30AM. However, pt now off unit at MRI. Will check back for OT eval as schedule permits.  Layla Maw 09/13/2021, 10:35 AM

## 2021-09-14 DIAGNOSIS — W19XXXD Unspecified fall, subsequent encounter: Secondary | ICD-10-CM | POA: Diagnosis not present

## 2021-09-14 DIAGNOSIS — M79605 Pain in left leg: Secondary | ICD-10-CM | POA: Diagnosis not present

## 2021-09-14 LAB — GLUCOSE, CAPILLARY
Glucose-Capillary: 268 mg/dL — ABNORMAL HIGH (ref 70–99)
Glucose-Capillary: 367 mg/dL — ABNORMAL HIGH (ref 70–99)
Glucose-Capillary: 381 mg/dL — ABNORMAL HIGH (ref 70–99)
Glucose-Capillary: 230 mg/dL — ABNORMAL HIGH (ref 70–99)

## 2021-09-14 LAB — BASIC METABOLIC PANEL
Anion gap: 8 (ref 5–15)
BUN: 21 mg/dL (ref 8–23)
CO2: 25 mmol/L (ref 22–32)
Calcium: 9.4 mg/dL (ref 8.9–10.3)
Chloride: 103 mmol/L (ref 98–111)
Creatinine, Ser: 1.24 mg/dL — ABNORMAL HIGH (ref 0.44–1.00)
GFR, Estimated: 48 mL/min — ABNORMAL LOW (ref >60)
Glucose, Bld: 204 mg/dL — ABNORMAL HIGH (ref 70–99)
Potassium: 4.5 mmol/L (ref 3.5–5.1)
Sodium: 136 mmol/L (ref 135–145)

## 2021-09-14 LAB — CBC
HCT: 33.9 % — ABNORMAL LOW (ref 36.0–46.0)
Hemoglobin: 11.7 g/dL — ABNORMAL LOW (ref 12.0–15.0)
MCH: 31.6 pg (ref 26.0–34.0)
MCHC: 34.5 g/dL (ref 30.0–36.0)
MCV: 91.6 fL (ref 80.0–100.0)
Platelets: 236 10*3/uL (ref 150–400)
RBC: 3.7 MIL/uL — ABNORMAL LOW (ref 3.87–5.11)
RDW: 12.7 % (ref 11.5–15.5)
WBC: 11 10*3/uL — ABNORMAL HIGH (ref 4.0–10.5)
nRBC: 0 % (ref 0.0–0.2)

## 2021-09-14 LAB — MAGNESIUM: Magnesium: 2.1 mg/dL (ref 1.7–2.4)

## 2021-09-14 MED ORDER — INSULIN ASPART 100 UNIT/ML IJ SOLN
0.0000 [IU] | Freq: Three times a day (TID) | INTRAMUSCULAR | Status: DC
  Administered 2021-09-14: 20 [IU] via SUBCUTANEOUS
  Administered 2021-09-14: 7 [IU] via SUBCUTANEOUS
  Administered 2021-09-15: 20 [IU] via SUBCUTANEOUS
  Administered 2021-09-15: 15 [IU] via SUBCUTANEOUS
  Administered 2021-09-15: 11 [IU] via SUBCUTANEOUS
  Administered 2021-09-15 – 2021-09-16 (×3): 20 [IU] via SUBCUTANEOUS
  Administered 2021-09-16: 11 [IU] via SUBCUTANEOUS

## 2021-09-14 NOTE — Progress Notes (Signed)
PROGRESS NOTE    Amanda Mendez  XHB:[id number] DOB: 1956-02-24 DOA: 09/10/2021 PCP: Redmond School, MD    Brief Narrative:  This 65 year old female with history of DM2, CAD, HTN, diastolic CHF came to the ED after sustaining a fall where she claims her legs gave out.  She reported she fell early in the morning on the day of admission but her family woke up later in the day when they checked up on her and was found to be on the floor.  In the ED she had mild leukocytosis, trauma work-up was overall negative but had diminished blood flow to the lower extremity right greater than left.  Vascular surgery was consulted and patient was transferred to [facility] for further evaluation.  She underwent aortogram on 10/4 without evidence of major blockage and did not require intervention.  She continued to have left lower extremity pain therefore MRI lumbar spine ordered along with IV steroids.  Assessment & Plan:   Principal Problem:   Fall Active Problems:   DM (diabetes mellitus) ([place])   HTN (hypertension)   CAD (coronary artery disease)   Left leg pain  Severe left leg pain sec. to PAD with concerns for critical limb ischemia: She was seen by vascular surgery and underwent arteriogram and left lower extremity angiogram without any evidence of significant blockage. Her pain appears to be more claudication versus sciatica. She also reports of some lower back pain therefore MRI lumbar spine was obtained. Which showed nerve impingement and mild disc bulge.  Discussed with Dr. Saintclair Halsted from neurosurgery who recommended continuing the steroids for 1 to 2 days and watch her symptoms. Continue  Decadron 4 mg every 12 hours. If it doesn't help and continues to have trouble bearing weight, she may benefit from CT hip, knee and ortho eval. PT/OT-recommended home health. She may eventually benefit from outpatient referral to pain management.   CAD s/p PCI in 2013. Continue aspirin and  statin. Plavix was apparently stopped 2 months ago.    Type 2 diabetes, uncontrolled: Hold p.o. home meds. Continue sliding scale, Lantus 12 units daily Diabetic coordinator consult  Essential hypertension Continue metoprolol and losartan.  CKD stage IIIa Serum creatinine at baseline   DVT prophylaxis: Heparin subcu Code Status: Full code Family Communication: No family at bedside Disposition Plan:   Status is: Inpatient  Remains inpatient appropriate because:Inpatient level of care appropriate due to severity of illness  Dispo: The patient is from: Home              Anticipated d/c is to: Home              Patient currently is not medically stable to d/c.   Difficult to place patient No   Consultants:  Vascular surgery  Procedures:  Antimicrobials:   Anti-infectives (From admission, onward)    None       Subjective: Patient was seen and examined at bedside.  Overnight events noted.   Patient reports she  still has left lower leg pain,  She does not seem ready to be discharged.  Objective: Vitals:   09/13/21 0913 09/13/21 1140 09/13/21 2043 09/14/21 0422  BP: (!) 116/50 119/77 132/74 (P) 140/80  Pulse:  80 90 (P) 80  Resp:   20 (P) 20  Temp:  100 F (37.8 C) 98.1 F (36.7 C) (P) 98.5 F (36.9 C)  TempSrc:   Oral (P) Oral  SpO2:  92% 93% (P) 94%  Weight:    (P) 76.2  kg  Height:        Intake/Output Summary (Last 24 hours) at 09/14/2021 1247 Last data filed at 09/14/2021 1138 Gross per 24 hour  Intake 723 ml  Output 300 ml  Net 423 ml   Filed Weights   09/12/21 0439 09/13/21 0445 09/14/21 0422  Weight: 77.1 kg 76.9 kg (P) 76.2 kg    Examination:  General exam: Appears comfortable, not in any acute distress. Respiratory system: Clear to auscultation bilaterally, respiratory effort normal, RR 15 Cardiovascular system: S1-S2 heard, regular rate and rhythm, no murmur. Gastrointestinal system: Abdominal soft, nontender, nondistended, BS+ Central  nervous system: Alert and oriented X 3. No focal neurological deficits. Extremities: Significant left lower extremity tenderness noted. Skin: No rashes, lesions or ulcers Psychiatry: Judgement and insight appear normal. Mood & affect appropriate.     Data Reviewed: I have personally reviewed following labs and imaging studies  CBC: Recent Labs  Lab 09/10/21 1647 09/11/21 0516 09/13/21 0353 09/14/21 0229  WBC 14.7* 14.1* 15.5* 11.0*  NEUTROABS 9.6*  --   --   --   HGB 14.1 13.2 11.9* 11.7*  HCT 42.0 40.3 36.6 33.9*  MCV 94.6 94.4 95.6 91.6  PLT 287 304 233 233   Basic Metabolic Panel: Recent Labs  Lab 09/10/21 1647 09/11/21 0516 09/12/21 0726 09/13/21 0353 09/14/21 0229  NA 136 136 140 136 136  K 4.1 3.4* 4.1 4.6 4.5  CL 99 102 105 104 103  CO2 27 25 27 23 25   GLUCOSE 376* 170* 182* 218* 204*  BUN 33* 28* 21 18 21   CREATININE 1.40* 1.25* 1.21* 1.24* 1.24*  CALCIUM 8.8* 8.3* 8.8* 8.7* 9.4  MG  --   --   --  1.7 2.1   GFR: Estimated Creatinine Clearance: 42.8 mL/min (A) (by C-G formula based on SCr of 1.24 mg/dL (H)). Liver Function Tests: Recent Labs  Lab 09/10/21 1647  AST 18  ALT 30  ALKPHOS 91  BILITOT 0.9  PROT 7.1  ALBUMIN 3.9   No results for input(s): LIPASE, AMYLASE in the last 168 hours. No results for input(s): AMMONIA in the last 168 hours. Coagulation Profile: Recent Labs  Lab 09/10/21 1647  INR 0.9   Cardiac Enzymes: Recent Labs  Lab 09/10/21 1647  CKTOTAL 40   BNP (last 3 results) No results for input(s): PROBNP in the last 8760 hours. HbA1C: No results for input(s): HGBA1C in the last 72 hours. CBG: Recent Labs  Lab 09/13/21 1127 09/13/21 1616 09/13/21 2117 09/14/21 0605 09/14/21 1133  GLUCAP 216* 414* 330* 268* 367*   Lipid Profile: No results for input(s): CHOL, HDL, LDLCALC, TRIG, CHOLHDL, LDLDIRECT in the last 72 hours. Thyroid Function Tests: Recent Labs    09/12/21 0726  TSH 3.913   Anemia Panel: No results  for input(s): VITAMINB12, FOLATE, FERRITIN, TIBC, IRON, RETICCTPCT in the last 72 hours. Sepsis Labs: No results for input(s): PROCALCITON, LATICACIDVEN in the last 168 hours.  Recent Results (from the past 240 hour(s))  Urine Culture     Status: Abnormal   Collection Time: 09/10/21  6:27 PM   Specimen: Urine, Catheterized  Result Value Ref Range Status   Specimen Description   Final    URINE, CATHETERIZED Performed at [facility], [address], [place], [place] [address]    Special Requests   Final    NONE Performed at [facility], [address] [place], [place] [address]    Culture MULTIPLE SPECIES PRESENT, SUGGEST RECOLLECTION (A)  Final  Report Status 09/11/2021 FINAL  Final  Resp Panel by RT-PCR (Flu A&B, Covid) Nasopharyngeal Swab     Status: None   Collection Time: 09/10/21  6:40 PM   Specimen: Nasopharyngeal Swab; Nasopharyngeal(NP) swabs in vial transport medium  Result Value Ref Range Status   SARS Coronavirus 2 by RT PCR NEGATIVE NEGATIVE Final    Comment: (NOTE) SARS-CoV-2 target nucleic acids are NOT DETECTED.  The SARS-CoV-2 RNA is generally detectable in upper respiratory specimens during the acute phase of infection. The lowest concentration of SARS-CoV-2 viral copies this assay can detect is 138 copies/mL. A negative result does not preclude SARS-Cov-2 infection and should not be used as the sole basis for treatment or other patient management decisions. A negative result may occur with  improper specimen collection/handling, submission of specimen other than nasopharyngeal swab, presence of viral mutation(s) within the areas targeted by this assay, and inadequate number of viral copies(<138 copies/mL). A negative result must be combined with clinical observations, patient history, and epidemiological information. The expected result is Negative.  Fact Sheet for Patients:  EntrepreneurPulse.com.au  Fact Sheet for Healthcare Providers:   IncredibleEmployment.be  This test is no t yet approved or cleared by the [place] FDA and  has been authorized for detection and/or diagnosis of SARS-CoV-2 by FDA under an Emergency Use Authorization (EUA). This EUA will remain  in effect (meaning this test can be used) for the duration of the COVID-19 declaration under Section 564(b)(1) of the Act, 21 U.S.C.section 360bbb-3(b)(1), unless the authorization is terminated  or revoked sooner.       Influenza A by PCR NEGATIVE NEGATIVE Final   Influenza B by PCR NEGATIVE NEGATIVE Final    Comment: (NOTE) The Xpert Xpress SARS-CoV-2/FLU/RSV plus assay is intended as an aid in the diagnosis of influenza from Nasopharyngeal swab specimens and should not be used as a sole basis for treatment. Nasal washings and aspirates are unacceptable for Xpert Xpress SARS-CoV-2/FLU/RSV testing.  Fact Sheet for Patients: EntrepreneurPulse.com.au  Fact Sheet for Healthcare Providers: IncredibleEmployment.be  This test is not yet approved or cleared by the [place] FDA and has been authorized for detection and/or diagnosis of SARS-CoV-2 by FDA under an Emergency Use Authorization (EUA). This EUA will remain in effect (meaning this test can be used) for the duration of the COVID-19 declaration under Section 564(b)(1) of the Act, 21 U.S.C. section 360bbb-3(b)(1), unless the authorization is terminated or revoked.  Performed at [facility], [address], [place], [place] [address]     Radiology Studies: MR LUMBAR SPINE WO CONTRAST  Result Date: 09/13/2021 CLINICAL DATA:  Low back pain. EXAM: MRI LUMBAR SPINE WITHOUT CONTRAST TECHNIQUE: Multiplanar, multisequence MR imaging of the lumbar spine was performed. No intravenous contrast was administered. COMPARISON:  Radiographs September 09, 2021. FINDINGS: Segmentation:  Standard. Alignment:  Physiologic. Vertebrae: No fracture, evidence of  discitis, or bone lesion. Endplate degenerative changes at L5-S1. Conus medullaris and cauda equina: Conus extends to the L1 level. Conus and cauda equina appear normal. Paraspinal and other soft tissues: Nodular thickening of the bilateral adrenal glands as seen on prior CT of the abdomen performed on June 10, 2020 Disc levels: T12-L1: Shallow disc bulge. No significant spinal canal or neural foraminal stenosis. L1-2: Shallow disc bulge and mild facet degenerative changes without significant spinal canal or neural foraminal. L2-3: Mild facet degenerative changes. No significant spinal canal or neural foraminal stenosis L3-4: Disc bulge, moderate facet degenerative change ligamentum flavum redundancy resulting in mild spinal canal  stenosis and mild bilateral neural foraminal narrowing. L4-5: Disc bulge, mild-to-moderate facet degenerative changes ligamentum flavum redundancy resulting in mild spinal canal stenosis and mild bilateral neural foraminal narrowing. L5-S1 disc bulge, mild-to-moderate facet degenerative changes and ligamentum flavum redundancy resulting in moderate bilateral neural foraminal narrowing, right greater than left he. No significant spinal canal stenosis. IMPRESSION: 1. Degenerative changes of the lumbar spine, more pronounced at L5-S1 where there is moderate bilateral neural foraminal narrowing, right greater than left, with possible impingement on the bilateral exiting L5 nerve roots. 2. No high-grade spinal canal stenosis at any level. Electronically Signed   By: Pedro Earls M.D.   On: 09/13/2021 11:02    Scheduled Meds:  aspirin EC  81 mg Oral Daily   cloNIDine  0.1 mg Oral BID   dexamethasone (DECADRON) injection  4 mg Intravenous Q12H   gabapentin  300 mg Oral TID   heparin  5,000 Units Subcutaneous Q8H   insulin aspart  0-20 Units Subcutaneous TID WC & HS   insulin aspart  3 Units Subcutaneous TID WC   insulin glargine-yfgn  12 Units Subcutaneous Daily    losartan  100 mg Oral Daily   metoprolol tartrate  50 mg Oral BID   pantoprazole  40 mg Oral Daily   rosuvastatin  10 mg Oral Daily   sodium chloride flush  3 mL Intravenous Q12H   Continuous Infusions:  sodium chloride       LOS: 3 days    Time spent: 35 mins    Richard Ritchey, MD [facility]   If 7PM-7AM, please contact night-coverage

## 2021-09-14 NOTE — Progress Notes (Signed)
Occupational Therapy Treatment Patient Details Name: Seth Friedlander MRN: [id number] DOB: 1956/01/09 Today's Date: 09/14/2021   History of present illness 65 y.o. female presented to the ED 09/10/21 with c/o a fall after LLE gave way due to chronic pain while getting up from a seated position, with resultant trauma to her head, sustaining lacerations. Pt down for approx 5 hours before family found her, reports 2 bouts of LoC.  Head, cervical pelvic and left wrist xray all without fracture or acute abnormality. No acute abnormalities in head CT or pelvic, L wrist and hand x-ray. Poor blood flow found to bilateral LE R>L. PMH: DM, HTN, CAD, Diastolic CHF.   OT comments  Pt able to ambulate in room with RW and verbal cues for sequencing. Min assist when making turn with LOB. Pt completed grooming in sitting and used BSC with set up for seated pericare. Donned socks in sitting with set up. Educated pt in multiple uses of 3 in 1.   Recommendations for follow up therapy are one component of a multi-disciplinary discharge planning process, led by the attending physician.  Recommendations may be updated based on patient status, additional functional criteria and insurance authorization.    Follow Up Recommendations  Home health OT;Supervision - Intermittent    Equipment Recommendations  3 in 1 bedside commode;Wheelchair (measurements OT);Wheelchair cushion (measurements OT)    Recommendations for Other Services      Precautions / Restrictions Precautions Precautions: Fall Restrictions Weight Bearing Restrictions: No       Mobility Bed Mobility Overal bed mobility: Modified Independent             General bed mobility comments: HOB up    Transfers Overall transfer level: Needs assistance Equipment used: Rolling walker (2 wheeled) Transfers: Sit to/from Stand Sit to Stand: Min guard         General transfer comment: cues for hand placement    Balance Overall balance  assessment: Needs assistance   Sitting balance-Leahy Scale: Good Sitting balance - Comments: no LOB donning socks   Standing balance support: Bilateral upper extremity supported Standing balance-Leahy Scale: Poor Standing balance comment: B UE and min to min guard assist                           ADL either performed or assessed with clinical judgement   ADL Overall ADL's : Needs assistance/impaired     Grooming: Wash/dry hands;Sitting;Set up               Lower Body Dressing: Min guard;Sit to/from stand   Toilet Transfer: Min guard;Ambulation;BSC;RW   Toileting- Clothing Manipulation and Hygiene: Set up;Sitting/lateral lean       Functional mobility during ADLs: Minimal assistance;Rolling walker;Cueing for sequencing       Vision       Perception     Praxis      Cognition Arousal/Alertness: Awake/alert Behavior During Therapy: WFL for tasks assessed/performed;Anxious Overall Cognitive Status: Within Functional Limits for tasks assessed                                          Exercises     Shoulder Instructions       General Comments      Pertinent Vitals/ Pain       Pain Assessment: 0-10 Pain Score: 6  Pain Location: L LE Pain  Descriptors / Indicators: Discomfort Pain Intervention(s): Monitored during session;Premedicated before session;Repositioned  Home Living                                          Prior Functioning/Environment              Frequency  Min 2X/week        Progress Toward Goals  OT Goals(current goals can now be found in the care plan section)  Progress towards OT goals: Progressing toward goals  Acute Rehab OT Goals Patient Stated Goal: have less pain in LE OT Goal Formulation: With patient Time For Goal Achievement: 09/27/21 Potential to Achieve Goals: Good  Plan Discharge plan remains appropriate    Co-evaluation                 AM-PAC OT "6  Clicks" Daily Activity     Outcome Measure   Help from another person eating meals?: None Help from another person taking care of personal grooming?: A Little Help from another person toileting, which includes using toliet, bedpan, or urinal?: A Little Help from another person bathing (including washing, rinsing, drying)?: A Little Help from another person to put on and taking off regular upper body clothing?: None Help from another person to put on and taking off regular lower body clothing?: A Little 6 Click Score: 20    End of Session Equipment Utilized During Treatment: Gait belt;Rolling walker  OT Visit Diagnosis: Other abnormalities of gait and mobility (R26.89);Unsteadiness on feet (R26.81);Muscle weakness (generalized) (M62.81);Pain Pain - Right/Left: Left Pain - part of body: Leg   Activity Tolerance Patient tolerated treatment well;Patient limited by pain   Patient Left in chair;with call bell/phone within reach   Nurse Communication          Time: 1093-2355 OT Time Calculation (min): 33 min  Charges: OT General Charges $OT Visit: 1 Visit OT Treatments $Self Care/Home Management : 23-37 mins  Nestor Lewandowsky, OTR/L Acute Rehabilitation Services Pager: [phone] Office: [phone]  Malka So 09/14/2021, 11:21 AM

## 2021-09-14 NOTE — Progress Notes (Signed)
Inpatient Diabetes Program Recommendations  AACE/ADA: New Consensus Statement on Inpatient Glycemic Control (2015)  Target Ranges:  Prepandial:   less than 140 mg/dL      Peak postprandial:   less than 180 mg/dL (1-2 hours)      Critically ill patients:  140 - 180 mg/dL   Lab Results  Component Value Date   GLUCAP 268 (H) 09/14/2021   HGBA1C 11.8 (H) 09/10/2021    Review of Glycemic Control Results for Amanda Mendez, Amanda Mendez (MRN [id number]) as of 09/14/2021 11:08  Ref. Range 09/13/2021 06:32 09/13/2021 11:27 09/13/2021 16:16 09/13/2021 21:17 09/14/2021 06:05  Glucose-Capillary Latest Ref Range: 70 - 99 mg/dL 205 (H) 216 (H) 414 (H) 330 (H) 268 (H)   Inpatient Diabetes Program Recommendations:   While on steroids, consider: -Increase Novolog correction to 0-20 units qid. Secure chat sent to Dr. Dwyane Dee.  Thank you, Nani Gasser. Claus Silvestro, RN, MSN, CDE  Diabetes Coordinator Inpatient Glycemic Control Team Team Pager [phone] (8am-5pm) 09/14/2021 11:09 AM

## 2021-09-14 NOTE — Plan of Care (Signed)
  Problem: Clinical Measurements: Goal: Will remain free from infection Outcome: Progressing   Problem: Clinical Measurements: Goal: Respiratory complications will improve Outcome: Progressing   Problem: Activity: Goal: Risk for activity intolerance will decrease Outcome: Progressing   Problem: Elimination: Goal: Will not experience complications related to bowel motility Outcome: Progressing   Problem: Elimination: Goal: Will not experience complications related to urinary retention Outcome: Progressing   Problem: Pain Managment: Goal: General experience of comfort will improve Outcome: Progressing   Problem: Safety: Goal: Ability to remain free from injury will improve Outcome: Progressing   Problem: Skin Integrity: Goal: Risk for impaired skin integrity will decrease Outcome: Progressing

## 2021-09-14 NOTE — Progress Notes (Signed)
Mobility Specialist Progress Note   09/14/21 1031  Mobility  Activity Sat and stood x 3;Ambulated in room  Level of Assistance Contact guard assist, steadying assist  Assistive Device Front wheel walker  Distance Ambulated (ft) 12 ft  Mobility Ambulated with assistance in room  Mobility Response Tolerated well  Mobility performed by Mobility specialist  $Mobility charge 1 Mobility   Pt received laying bed c/o to be in 6/10 pain on LLE but agreeable to mobility. Pt ambulated from EOB to window and back w/ no complications. After ambulation, completed x5 STS w/ min G. Pt returned back to bed w/ call bell and phone by side.   Holland Falling Mobility Specialist Phone Number [phone]

## 2021-09-14 NOTE — Progress Notes (Signed)
Physical Therapy Treatment Patient Details Name: Amanda Mendez MRN: [id number] DOB: Apr 24, 1956 Today's Date: 09/14/2021   History of Present Illness 65 y.o. female presented to the ED 09/10/21 with c/o a fall after LLE gave way due to chronic pain while getting up from a seated position, with resultant trauma to her head, sustaining lacerations. Pt down for approx 5 hours before family found her, reports 2 bouts of LoC.  Head, cervical pelvic and left wrist xray all without fracture or acute abnormality. No acute abnormalities in head CT or pelvic, L wrist and hand x-ray. Poor blood flow found to bilateral LE R>L. S/p aortogram with BLE run off 10/4 without evidence of major blockage and did not require intervention. MRI of spine 10/5 revealed moderate bilateral neural foraminal narrowing L5-S1, right greater than left, with possible impingement on the bilateral exiting L5 nerve roots. PMH: DM, HTN, CAD, Diastolic CHF.    PT Comments    Pt was able to progress to ambulating up to ~75 ft with a RW, but remains a high risk for subsequent falls as she displayed bouts of LOB needing up to minA to recover. These bouts tended to occur during turns, but pt denying "spinning" sensation when changing head positions. Pt remains limited in mobility by L leg pain, more so in her ankle. Noted (+) asymmetric unilateral L lower extremity slump test. Noted reproduction of L ankle pain with palpation at anterior aspect and with all ankle PROM, notified MD. Educated pt on increasing core strength to protect spine and performed several exercises end of session. Will continue to follow acutely. Current recommendations remain appropriate.    Recommendations for follow up therapy are one component of a multi-disciplinary discharge planning process, led by the attending physician.  Recommendations may be updated based on patient status, additional functional criteria and insurance authorization.  Follow Up Recommendations   Home health PT;Supervision for mobility/OOB     Equipment Recommendations  Rolling walker with 5" wheels;3in1 (PT)    Recommendations for Other Services       Precautions / Restrictions Precautions Precautions: Fall Restrictions Weight Bearing Restrictions: No     Mobility  Bed Mobility               General bed mobility comments: Pt sitting up in recliner upon arrival.    Transfers Overall transfer level: Needs assistance Equipment used: Rolling walker (2 wheeled) Transfers: Sit to/from Stand Sit to Stand: Min guard         General transfer comment: cues for hand placement, min guard assist for safety.  Ambulation/Gait Ambulation/Gait assistance: Min assist Gait Distance (Feet): 75 Feet (x2 bouts of ~25 ft > ~75 ft) Assistive device: Rolling walker (2 wheeled) Gait Pattern/deviations: Step-through pattern;Antalgic;Decreased stride length;Trunk flexed Gait velocity: reduced Gait velocity interpretation: <1.31 ft/sec, indicative of household ambulator General Gait Details: Pt with slightly antalgic gait pattern due to L leg pain, especially as distance progressed. Pt needing repeated cues to remain proximal within RW and continue pushing RW like a grocery cart rather than stopping it every other step. Pt with posterior LOB when turning, needing minA to recover. Pt denied "spinning" sensation, even when cued to look either direction changing head positions.   Stairs             Wheelchair Mobility    Modified Rankin (Stroke Patients Only) Modified Rankin (Stroke Patients Only) Pre-Morbid Rankin Score: No significant disability Modified Rankin: Moderately severe disability     Balance Overall balance assessment: Needs  assistance   Sitting balance-Leahy Scale: Good     Standing balance support: Bilateral upper extremity supported;Single extremity supported Standing balance-Leahy Scale: Poor Standing balance comment: UE support and min  guard-minA.                            Cognition Arousal/Alertness: Awake/alert Behavior During Therapy: WFL for tasks assessed/performed;Anxious Overall Cognitive Status: Within Functional Limits for tasks assessed                                 General Comments: easily distracted with tangential conversation but pleasant and participatory. cues needed for safety and to notify staff when she is ready to get up and not get up alone.      Exercises General Exercises - Lower Extremity Hip Flexion/Marching: Strengthening;Both;10 reps;Seated (to tolerable AROM on L) Other Exercises Other Exercises: Modified sit-ups coming up with hands on chest from reclined back of chair, 20x Other Exercises: Anterior pelvic tilts, 3 sec holds, in sitting, 10x Other Exercises: Active lumbar extension to tolerance in sitting, 10x    General Comments General comments (skin integrity, edema, etc.): (+) asymmetrical unilateral L lower extremity slump test; noted reproduction of L ankle pain with palpation at anterior aspect and with all ankle PROM, notified MD; educated pt on increasing core strength to protect spine      Pertinent Vitals/Pain Pain Assessment: 0-10 Pain Score: 10-Worst pain ever Pain Location: L ankle, entire L leg Pain Descriptors / Indicators: Discomfort;Shooting;Grimacing;Guarding Pain Intervention(s): Limited activity within patient's tolerance;Monitored during session;Repositioned    Home Living                      Prior Function            PT Goals (current goals can now be found in the care plan section) Acute Rehab PT Goals Patient Stated Goal: have less pain in LE PT Goal Formulation: With patient Time For Goal Achievement: 09/26/21 Potential to Achieve Goals: Good Progress towards PT goals: Progressing toward goals    Frequency    Min 3X/week      PT Plan Current plan remains appropriate    Co-evaluation               AM-PAC PT "6 Clicks" Mobility   Outcome Measure  Help needed turning from your back to your side while in a flat bed without using bedrails?: None Help needed moving from lying on your back to sitting on the side of a flat bed without using bedrails?: None Help needed moving to and from a bed to a chair (including a wheelchair)?: A Little Help needed standing up from a chair using your arms (e.g., wheelchair or bedside chair)?: A Little Help needed to walk in hospital room?: A Little Help needed climbing 3-5 steps with a railing? : A Lot 6 Click Score: 19    End of Session Equipment Utilized During Treatment: Gait belt Activity Tolerance: Patient limited by pain Patient left: in chair;with call bell/phone within reach;with chair alarm set   PT Visit Diagnosis: Unsteadiness on feet (R26.81);Other abnormalities of gait and mobility (R26.89);Muscle weakness (generalized) (M62.81);Repeated falls (R29.6);History of falling (Z91.81);Pain;Difficulty in walking, not elsewhere classified (R26.2) Pain - Right/Left: Left Pain - part of body: Leg;Ankle and joints of foot     Time: 4627-0350 PT Time Calculation (min) (ACUTE ONLY): 35 min  Charges:  $Gait Training: 8-22 mins $Therapeutic Exercise: 8-22 mins                     Moishe Spice, PT, DPT Acute Rehabilitation Services  Pager: [phone] Office: [phone]    Orvan Falconer 09/14/2021, 5:42 PM

## 2021-09-15 ENCOUNTER — Inpatient Hospital Stay (HOSPITAL_COMMUNITY): Payer: BC Managed Care – PPO

## 2021-09-15 DIAGNOSIS — M79605 Pain in left leg: Secondary | ICD-10-CM | POA: Diagnosis not present

## 2021-09-15 DIAGNOSIS — W19XXXD Unspecified fall, subsequent encounter: Secondary | ICD-10-CM | POA: Diagnosis not present

## 2021-09-15 LAB — CBC
HCT: 33.2 % — ABNORMAL LOW (ref 36.0–46.0)
Hemoglobin: 11.1 g/dL — ABNORMAL LOW (ref 12.0–15.0)
MCH: 31.1 pg (ref 26.0–34.0)
MCHC: 33.4 g/dL (ref 30.0–36.0)
MCV: 93 fL (ref 80.0–100.0)
Platelets: 260 10*3/uL (ref 150–400)
RBC: 3.57 MIL/uL — ABNORMAL LOW (ref 3.87–5.11)
RDW: 12.7 % (ref 11.5–15.5)
WBC: 14.6 10*3/uL — ABNORMAL HIGH (ref 4.0–10.5)
nRBC: 0 % (ref 0.0–0.2)

## 2021-09-15 LAB — GLUCOSE, CAPILLARY
Glucose-Capillary: 298 mg/dL — ABNORMAL HIGH (ref 70–99)
Glucose-Capillary: 292 mg/dL — ABNORMAL HIGH (ref 70–99)
Glucose-Capillary: 322 mg/dL — ABNORMAL HIGH (ref 70–99)
Glucose-Capillary: 439 mg/dL — ABNORMAL HIGH (ref 70–99)
Glucose-Capillary: 377 mg/dL — ABNORMAL HIGH (ref 70–99)

## 2021-09-15 LAB — BASIC METABOLIC PANEL
Anion gap: 8 (ref 5–15)
BUN: 30 mg/dL — ABNORMAL HIGH (ref 8–23)
CO2: 23 mmol/L (ref 22–32)
Calcium: 9.1 mg/dL (ref 8.9–10.3)
Chloride: 103 mmol/L (ref 98–111)
Creatinine, Ser: 1.21 mg/dL — ABNORMAL HIGH (ref 0.44–1.00)
GFR, Estimated: 49 mL/min — ABNORMAL LOW (ref >60)
Glucose, Bld: 327 mg/dL — ABNORMAL HIGH (ref 70–99)
Potassium: 4.7 mmol/L (ref 3.5–5.1)
Sodium: 134 mmol/L — ABNORMAL LOW (ref 135–145)

## 2021-09-15 LAB — MAGNESIUM: Magnesium: 1.9 mg/dL (ref 1.7–2.4)

## 2021-09-15 IMAGING — DX DG ANKLE 2V *L*
1 series · 2 of 2 positions shown · non-contrast
Comparison: None.

CLINICAL DATA: Left ankle pain for 2 weeks.  No known injury.

EXAM:
LEFT ANKLE - 2 VIEW

[Series 1: ankle · 0.14mm/px · 2 of 2 slices shown]
[im 1/2]
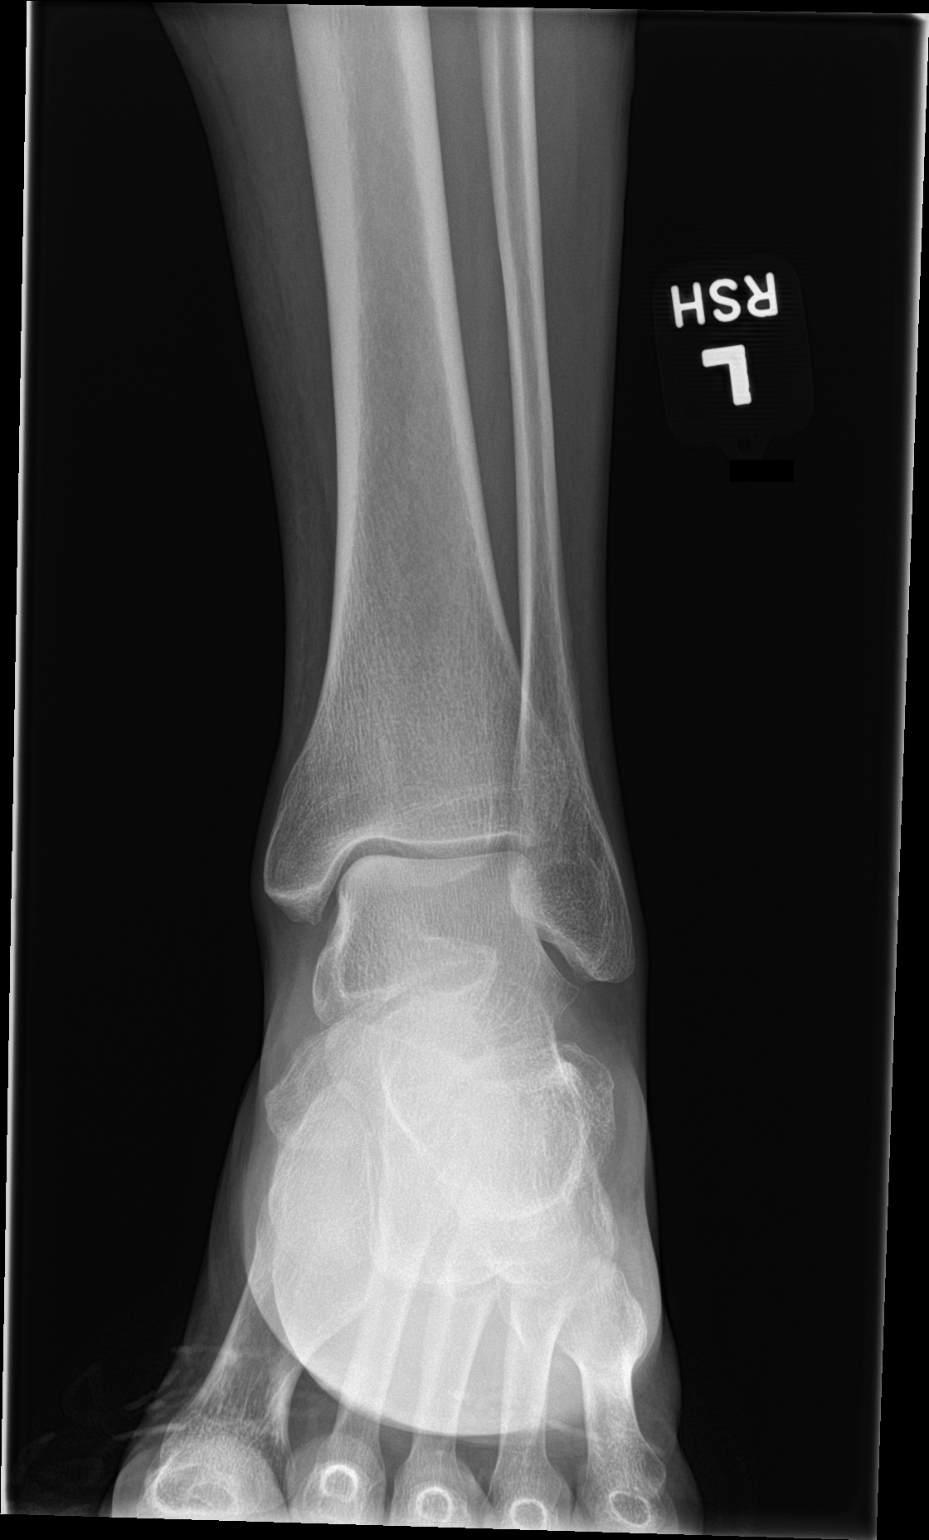
[im 2/2]
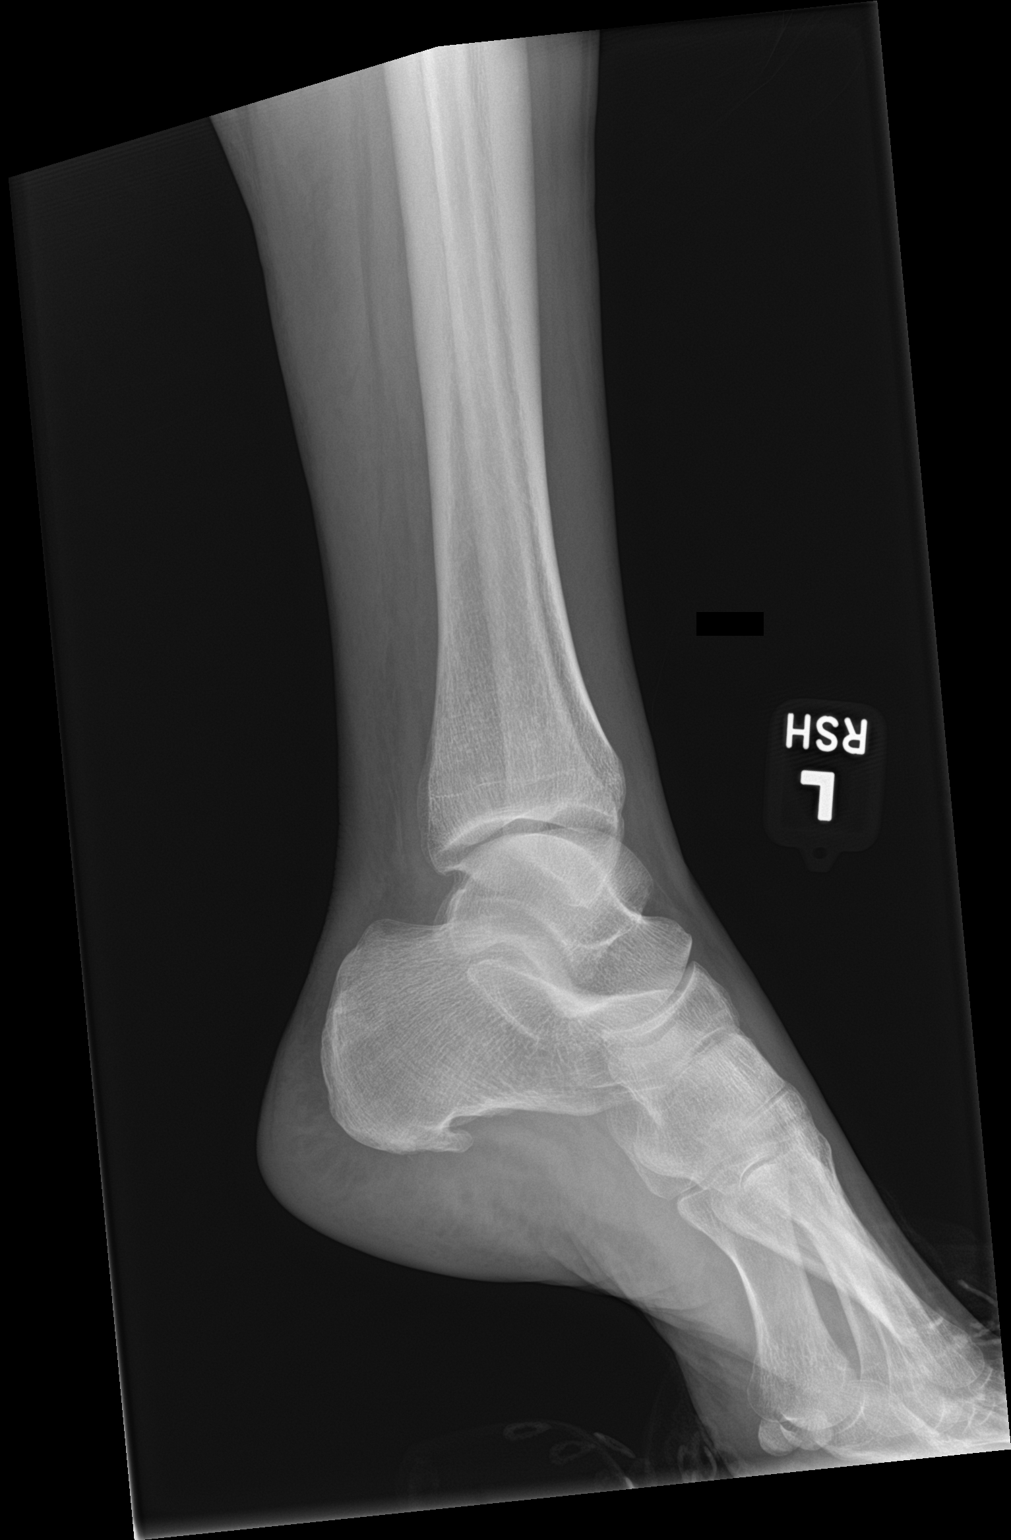

[2 of 2 positions shown; findings below may reference images not displayed]

FINDINGS: The ankle mortise is maintained. No acute fracture. No osteochondral
lesion.

There is a vague lucency noted in the distal fibula. This is an
indeterminate finding. Recommend MR imaging of the left ankle
without with contrast for further evaluation.
IMPRESSION: 1. No acute bony findings or significant degenerative changes.
2. Vague lucency in the distal fibula. Recommend MR imaging of the
left ankle without with contrast for further evaluation.

## 2021-09-15 MED ORDER — INSULIN ASPART 100 UNIT/ML IJ SOLN
4.0000 [IU] | Freq: Three times a day (TID) | INTRAMUSCULAR | Status: DC
  Administered 2021-09-15 – 2021-09-16 (×5): 4 [IU] via SUBCUTANEOUS

## 2021-09-15 MED ORDER — INSULIN GLARGINE-YFGN 100 UNIT/ML ~~LOC~~ SOLN
15.0000 [IU] | Freq: Every day | SUBCUTANEOUS | Status: DC
  Administered 2021-09-16: 15 [IU] via SUBCUTANEOUS
  Filled 2021-09-15: qty 0.15

## 2021-09-15 NOTE — Progress Notes (Signed)
Dr. Dwyane Dee made aware of pt's blood sugar being 439. No new orders at the present time.

## 2021-09-15 NOTE — Progress Notes (Signed)
PROGRESS NOTE    Amanda Mendez  GEX:[id number] DOB: 24-Oct-1956 DOA: 09/10/2021 PCP: Redmond School, MD    Brief Narrative:  This 65 year old female with history of DM2, CAD, HTN, diastolic CHF came to the ED after sustaining a fall where she claims her legs gave out.  She reported she fell early in the morning on the day of admission but her family woke up later in the day when they checked up on her and was found to be on the floor.  In the ED she had mild leukocytosis, trauma work-up was overall negative but had diminished blood flow to the lower extremity right greater than left.  Vascular surgery was consulted and patient was transferred to [facility] for further evaluation. She underwent aortogram on 10/4 without evidence of major blockage and did not require intervention.  She continued to have left lower extremity pain therefore MRI lumbar spine ordered along with IV steroids.  Assessment & Plan:   Principal Problem:   Fall Active Problems:   DM (diabetes mellitus) ([place])   HTN (hypertension)   CAD (coronary artery disease)   Left leg pain  Severe left leg pain sec. to PAD with concerns for critical limb ischemia: She was seen by vascular surgery and underwent arteriogram and left lower extremity angiogram without any evidence of significant blockage. Her pain appears to be more claudication versus sciatica. She also reports of some lower back pain therefore MRI lumbar spine was obtained. Which showed nerve impingement and mild disc bulge.  Discussed with Dr. Saintclair Halsted from neurosurgery who recommended continuing the steroids for 1 to 2 days and watch her symptoms. Continue  Decadron 4 mg every 12 hours. If it doesn't help and continues to have trouble bearing weight, She may benefit from ortho eval. She continues to have significant left ankle pain, not able to bear weight on foot, Ortho consulted  Orthopaedics states her pain does not seem like Orthopaedic  in nature  recommended cam boot and weightbearing as tolerated. PT/OT-recommended home health. She may eventually benefit from outpatient referral to pain management.   CAD s/p PCI in 2013. Continue aspirin and statin. Plavix was apparently stopped 2 months ago.    Type 2 diabetes, uncontrolled: Hold p.o. home meds. Continue sliding scale, Lantus 12 units daily Diabetic coordinator consult  Essential hypertension Continue metoprolol and losartan.  CKD stage IIIa Serum creatinine at baseline.   DVT prophylaxis: Heparin subcu Code Status: Full code Family Communication: No family at bedside Disposition Plan:   Status is: Inpatient  Remains inpatient appropriate because:Inpatient level of care appropriate due to severity of illness  Dispo: The patient is from: Home              Anticipated d/c is to: Home with home services              Patient currently is not medically stable to d/c.   Difficult to place patient No   Consultants:  Vascular surgery  Procedures:  Antimicrobials:   Anti-infectives (From admission, onward)    None       Subjective: Patient was seen and examined at bedside.  Overnight events noted.   Patient still reports having significant left ankle pain She is not able to bear weight.  She states does not want to fall again at home.  Objective: Vitals:   09/14/21 0422 09/14/21 2004 09/14/21 2126 09/15/21 0523  BP: (P) 140/80 138/71 132/69 131/72  Pulse: (P) 80 72 67 69  Resp: (P)  20 20  16   Temp: (P) 98.5 F (36.9 C) (!) 97.5 F (36.4 C)  98.6 F (37 C)  TempSrc: (P) Oral Oral  Oral  SpO2: (P) 94% 97%  93%  Weight: (P) 76.2 kg   75.7 kg  Height:        Intake/Output Summary (Last 24 hours) at 09/15/2021 1228 Last data filed at 09/15/2021 0900 Gross per 24 hour  Intake 1200 ml  Output 1500 ml  Net -300 ml   Filed Weights   09/13/21 0445 09/14/21 0422 09/15/21 0523  Weight: 76.9 kg (P) 76.2 kg 75.7 kg    Examination:  General exam:  Appears comfortable, not in any acute distress. Respiratory system: Clear to auscultation bilaterally, respiratory effort normal, RR 15 Cardiovascular system: S1-S2 heard, regular rate and rhythm, no murmur. Gastrointestinal system: Abdominal soft, nontender, nondistended, BS+ Central nervous system: Alert and oriented X 3. No focal neurological deficits. Extremities: Significant left ankle tenderness noted, restricted movements. Skin: No rashes, lesions or ulcers Psychiatry: Judgement and insight appear normal. Mood & affect appropriate.     Data Reviewed: I have personally reviewed following labs and imaging studies  CBC: Recent Labs  Lab 09/10/21 1647 09/11/21 0516 09/13/21 0353 09/14/21 0229 09/15/21 0339  WBC 14.7* 14.1* 15.5* 11.0* 14.6*  NEUTROABS 9.6*  --   --   --   --   HGB 14.1 13.2 11.9* 11.7* 11.1*  HCT 42.0 40.3 36.6 33.9* 33.2*  MCV 94.6 94.4 95.6 91.6 93.0  PLT 287 304 233 236 062   Basic Metabolic Panel: Recent Labs  Lab 09/11/21 0516 09/12/21 0726 09/13/21 0353 09/14/21 0229 09/15/21 0339  NA 136 140 136 136 134*  K 3.4* 4.1 4.6 4.5 4.7  CL 102 105 104 103 103  CO2 25 27 23 25 23   GLUCOSE 170* 182* 218* 204* 327*  BUN 28* 21 18 21  30*  CREATININE 1.25* 1.21* 1.24* 1.24* 1.21*  CALCIUM 8.3* 8.8* 8.7* 9.4 9.1  MG  --   --  1.7 2.1 1.9   GFR: Estimated Creatinine Clearance: 43.5 mL/min (A) (by C-G formula based on SCr of 1.21 mg/dL (H)). Liver Function Tests: Recent Labs  Lab 09/10/21 1647  AST 18  ALT 30  ALKPHOS 91  BILITOT 0.9  PROT 7.1  ALBUMIN 3.9   No results for input(s): LIPASE, AMYLASE in the last 168 hours. No results for input(s): AMMONIA in the last 168 hours. Coagulation Profile: Recent Labs  Lab 09/10/21 1647  INR 0.9   Cardiac Enzymes: Recent Labs  Lab 09/10/21 1647  CKTOTAL 40   BNP (last 3 results) No results for input(s): PROBNP in the last 8760 hours. HbA1C: No results for input(s): HGBA1C in the last 72  hours. CBG: Recent Labs  Lab 09/14/21 1633 09/14/21 2134 09/15/21 0528 09/15/21 0728 09/15/21 1136  GLUCAP 381* 230* 298* 292* 322*   Lipid Profile: No results for input(s): CHOL, HDL, LDLCALC, TRIG, CHOLHDL, LDLDIRECT in the last 72 hours. Thyroid Function Tests: No results for input(s): TSH, T4TOTAL, FREET4, T3FREE, THYROIDAB in the last 72 hours.  Anemia Panel: No results for input(s): VITAMINB12, FOLATE, FERRITIN, TIBC, IRON, RETICCTPCT in the last 72 hours. Sepsis Labs: No results for input(s): PROCALCITON, LATICACIDVEN in the last 168 hours.  Recent Results (from the past 240 hour(s))  Urine Culture     Status: Abnormal   Collection Time: 09/10/21  6:27 PM   Specimen: Urine, Catheterized  Result Value Ref Range Status   Specimen Description  Final    URINE, CATHETERIZED Performed at [facility], [address], [place], [place] [address]    Special Requests   Final    NONE Performed at [facility], [address], [place], [place] [address]    Culture MULTIPLE SPECIES PRESENT, SUGGEST RECOLLECTION (A)  Final   Report Status 09/11/2021 FINAL  Final  Resp Panel by RT-PCR (Flu A&B, Covid) Nasopharyngeal Swab     Status: None   Collection Time: 09/10/21  6:40 PM   Specimen: Nasopharyngeal Swab; Nasopharyngeal(NP) swabs in vial transport medium  Result Value Ref Range Status   SARS Coronavirus 2 by RT PCR NEGATIVE NEGATIVE Final    Comment: (NOTE) SARS-CoV-2 target nucleic acids are NOT DETECTED.  The SARS-CoV-2 RNA is generally detectable in upper respiratory specimens during the acute phase of infection. The lowest concentration of SARS-CoV-2 viral copies this assay can detect is 138 copies/mL. A negative result does not preclude SARS-Cov-2 infection and should not be used as the sole basis for treatment or other patient management decisions. A negative result may occur with  improper specimen collection/handling, submission of specimen other than nasopharyngeal  swab, presence of viral mutation(s) within the areas targeted by this assay, and inadequate number of viral copies(<138 copies/mL). A negative result must be combined with clinical observations, patient history, and epidemiological information. The expected result is Negative.  Fact Sheet for Patients:  EntrepreneurPulse.com.au  Fact Sheet for Healthcare Providers:  IncredibleEmployment.be  This test is no t yet approved or cleared by the [place] FDA and  has been authorized for detection and/or diagnosis of SARS-CoV-2 by FDA under an Emergency Use Authorization (EUA). This EUA will remain  in effect (meaning this test can be used) for the duration of the COVID-19 declaration under Section 564(b)(1) of the Act, 21 U.S.C.section 360bbb-3(b)(1), unless the authorization is terminated  or revoked sooner.       Influenza A by PCR NEGATIVE NEGATIVE Final   Influenza B by PCR NEGATIVE NEGATIVE Final    Comment: (NOTE) The Xpert Xpress SARS-CoV-2/FLU/RSV plus assay is intended as an aid in the diagnosis of influenza from Nasopharyngeal swab specimens and should not be used as a sole basis for treatment. Nasal washings and aspirates are unacceptable for Xpert Xpress SARS-CoV-2/FLU/RSV testing.  Fact Sheet for Patients: EntrepreneurPulse.com.au  Fact Sheet for Healthcare Providers: IncredibleEmployment.be  This test is not yet approved or cleared by the [place] FDA and has been authorized for detection and/or diagnosis of SARS-CoV-2 by FDA under an Emergency Use Authorization (EUA). This EUA will remain in effect (meaning this test can be used) for the duration of the COVID-19 declaration under Section 564(b)(1) of the Act, 21 U.S.C. section 360bbb-3(b)(1), unless the authorization is terminated or revoked.  Performed at [facility], [address] [place], [place] [address]     Radiology  Studies: No results found.  Scheduled Meds:  aspirin EC  81 mg Oral Daily   cloNIDine  0.1 mg Oral BID   dexamethasone (DECADRON) injection  4 mg Intravenous Q12H   gabapentin  300 mg Oral TID   heparin  5,000 Units Subcutaneous Q8H   insulin aspart  0-20 Units Subcutaneous TID WC & HS   insulin aspart  4 Units Subcutaneous TID WC   [START ON 09/16/2021] insulin glargine-yfgn  15 Units Subcutaneous Daily   losartan  100 mg Oral Daily   metoprolol tartrate  50 mg Oral BID   pantoprazole  40 mg Oral Daily   rosuvastatin  10 mg Oral Daily   sodium chloride flush  3 mL Intravenous Q12H   Continuous Infusions:  sodium chloride       LOS: 4 days    Time spent: 25 mins    Hanne Kegg, MD [facility]   If 7PM-7AM, please contact night-coverage

## 2021-09-15 NOTE — Progress Notes (Signed)
Mobility Specialist Progress Note   09/15/21 1100  Mobility  Activity Ambulated in room  Level of Assistance Modified independent, requires aide device or extra time  Assistive Device Front wheel walker  Distance Ambulated (ft) 50 ft  Mobility Ambulated with assistance in room  Mobility Response Tolerated well  Mobility performed by Mobility specialist  Bed Position Chair  $Mobility charge 1 Mobility   Received pt sitting up in bed c/o no symptoms and agreeable to mobility session. x5 STS on both sides of bed w/ light ambulation in room. Pt able to tolerate more pressure on toes while ambulating during session. Pt returned back to chair in preparation for lunch. Call bell by side and chair alarm on.   Holland Falling Mobility Specialist Phone Number [phone]

## 2021-09-15 NOTE — Consult Note (Signed)
Reason for Consult:Left ankle pain Referring Physician: Shawna Clamp Time called: [phone] Time at bedside: Amanda Mendez is an 65 y.o. female.  HPI: Amanda Mendez was admitted 4d ago after falling at work. She was found to have decreased blood flow to her legs and underwent vascular procedure but it appears to have autocorrected by that point. She c/o left leg pain that's been present for about 2 weeks with no antecedent event. She denies prior hx/o similar. She describes pain that worst at the ankle but radiates up the leg to the lateral hip area. She denies back pain. Ankle motion and ambulation make it worse.   Past Medical History:  Diagnosis Date   Anginal pain ([place])    Coronary artery disease    angioplasty 9/13   Exertional dyspnea    "usually; today it was with nothing" (11/03/2012)   GERD (gastroesophageal reflux disease)    Hypercholesterolemia with hypertriglyceridemia    Hypertension    Kidney stones    "I've had them 5 times; lithotripsy 1st time; flushed out  all the others" (11/03/2012)   Obesity (BMI 30-39.9)    Pneumonia 2012   Sinus headache    "weekly" (11/03/2012)   Type II diabetes mellitus ([place]) 2007    Past Surgical History:  Procedure Laterality Date   ABDOMINAL AORTOGRAM W/LOWER EXTREMITY Left 09/12/2021   Procedure: ABDOMINAL AORTOGRAM W/LOWER EXTREMITY;  Surgeon: Serafina Mitchell, MD;  Location: [facility];  Service: Cardiovascular;  Laterality: Left;   CARDIAC CATHETERIZATION  11/25/29013   CORONARY ANGIOPLASTY WITH STENT PLACEMENT  08/2012   "1"   HEMATOMA EVACUATION  09/22/2012   Procedure: EVACUATION HEMATOMA;  Surgeon: Wynonia Sours, MD;  Location: [facility];  Service: Orthopedics;  Laterality: Right;  Evacuation Hematoma right hand   LEFT AND RIGHT HEART CATHETERIZATION WITH CORONARY ANGIOGRAM N/A 08/14/2012   Procedure: LEFT AND RIGHT HEART CATHETERIZATION WITH CORONARY ANGIOGRAM;  Surgeon: Minus Breeding, MD;  Location: [facility]  CATH LAB;  Service: Cardiovascular;  Laterality: N/A;   LEFT HEART CATHETERIZATION WITH CORONARY ANGIOGRAM N/A 11/03/2012   Procedure: LEFT HEART CATHETERIZATION WITH CORONARY ANGIOGRAM;  Surgeon: Jolaine Artist, MD;  Location: [facility] CATH LAB;  Service: Cardiovascular;  Laterality: N/A;   LITHOTRIPSY  1990's   PERCUTANEOUS CORONARY STENT INTERVENTION (PCI-S)  08/14/2012   Procedure: PERCUTANEOUS CORONARY STENT INTERVENTION (PCI-S);  Surgeon: Minus Breeding, MD;  Location: [facility] CATH LAB;  Service: Cardiovascular;;   SKIN BIOPSY  2012   "off my back; it was nothing fatal; infection caused it" (11/03/2012)   TUBAL LIGATION  1980    Family History  Problem Relation Age of Onset   Heart failure Mother        Died in her 70s, had angina starting in her 43s   Crohn's disease Brother     Social History:  reports that she quit smoking about 9 years ago. Her smoking use included cigarettes. She has a 55.50 pack-year smoking history. She has never used smokeless tobacco. She reports that she does not drink alcohol and does not use drugs.  Allergies:  Allergies  Allergen Reactions   Dapagliflozin Itching   Codeine Nausea And Vomiting    But has tolerated morphine    Medications: I have reviewed the patient's current medications.  Results for orders placed or performed during the hospital encounter of 09/10/21 (from the past 48 hour(s))  Glucose, capillary     Status: Abnormal   Collection Time: 09/13/21 11:27 AM  Result Value Ref Range   Glucose-Capillary 216 (H) 70 - 99 mg/dL    Comment: Glucose reference range applies only to samples taken after fasting for at least 8 hours.  Glucose, capillary     Status: Abnormal   Collection Time: 09/13/21  4:16 PM  Result Value Ref Range   Glucose-Capillary 414 (H) 70 - 99 mg/dL    Comment: Glucose reference range applies only to samples taken after fasting for at least 8 hours.  Glucose, capillary     Status: Abnormal   Collection Time: 09/13/21   9:17 PM  Result Value Ref Range   Glucose-Capillary 330 (H) 70 - 99 mg/dL    Comment: Glucose reference range applies only to samples taken after fasting for at least 8 hours.   Comment 1 Notify RN    Comment 2 Document in Chart   Basic metabolic panel     Status: Abnormal   Collection Time: 09/14/21  2:29 AM  Result Value Ref Range   Sodium 136 135 - 145 mmol/L   Potassium 4.5 3.5 - 5.1 mmol/L   Chloride 103 98 - 111 mmol/L   CO2 25 22 - 32 mmol/L   Glucose, Bld 204 (H) 70 - 99 mg/dL    Comment: Glucose reference range applies only to samples taken after fasting for at least 8 hours.   BUN 21 8 - 23 mg/dL   Creatinine, Ser 1.24 (H) 0.44 - 1.00 mg/dL   Calcium 9.4 8.9 - 10.3 mg/dL   GFR, Estimated 48 (L) >60 mL/min    Comment: (NOTE) Calculated using the CKD-EPI Creatinine Equation (2021)    Anion gap 8 5 - 15    Comment: Performed at [facility] [address], [place], [place] [address]  CBC     Status: Abnormal   Collection Time: 09/14/21  2:29 AM  Result Value Ref Range   WBC 11.0 (H) 4.0 - 10.5 K/uL   RBC 3.70 (L) 3.87 - 5.11 MIL/uL   Hemoglobin 11.7 (L) 12.0 - 15.0 g/dL   HCT 33.9 (L) 36.0 - 46.0 %   MCV 91.6 80.0 - 100.0 fL   MCH 31.6 26.0 - 34.0 pg   MCHC 34.5 30.0 - 36.0 g/dL   RDW 12.7 11.5 - 15.5 %   Platelets 236 150 - 400 K/uL   nRBC 0.0 0.0 - 0.2 %    Comment: Performed at [facility], [place] [address] [place], [place] [address]  Magnesium     Status: None   Collection Time: 09/14/21  2:29 AM  Result Value Ref Range   Magnesium 2.1 1.7 - 2.4 mg/dL    Comment: Performed at [facility] [address], [place], [place] [address]  Glucose, capillary     Status: Abnormal   Collection Time: 09/14/21  6:05 AM  Result Value Ref Range   Glucose-Capillary 268 (H) 70 - 99 mg/dL    Comment: Glucose reference range applies only to samples taken after fasting for at least 8 hours.   Comment 1 Notify RN    Comment 2 Document in Chart   Glucose,  capillary     Status: Abnormal   Collection Time: 09/14/21 11:33 AM  Result Value Ref Range   Glucose-Capillary 367 (H) 70 - 99 mg/dL    Comment: Glucose reference range applies only to samples taken after fasting for at least 8 hours.  Glucose, capillary     Status: Abnormal   Collection Time:  09/14/21  4:33 PM  Result Value Ref Range   Glucose-Capillary 381 (H) 70 - 99 mg/dL    Comment: Glucose reference range applies only to samples taken after fasting for at least 8 hours.  Glucose, capillary     Status: Abnormal   Collection Time: 09/14/21  9:34 PM  Result Value Ref Range   Glucose-Capillary 230 (H) 70 - 99 mg/dL    Comment: Glucose reference range applies only to samples taken after fasting for at least 8 hours.   Comment 1 Notify RN    Comment 2 Document in Chart   Basic metabolic panel     Status: Abnormal   Collection Time: 09/15/21  3:39 AM  Result Value Ref Range   Sodium 134 (L) 135 - 145 mmol/L   Potassium 4.7 3.5 - 5.1 mmol/L   Chloride 103 98 - 111 mmol/L   CO2 23 22 - 32 mmol/L   Glucose, Bld 327 (H) 70 - 99 mg/dL    Comment: Glucose reference range applies only to samples taken after fasting for at least 8 hours.   BUN 30 (H) 8 - 23 mg/dL   Creatinine, Ser 1.21 (H) 0.44 - 1.00 mg/dL   Calcium 9.1 8.9 - 10.3 mg/dL   GFR, Estimated 49 (L) >60 mL/min    Comment: (NOTE) Calculated using the CKD-EPI Creatinine Equation (2021)    Anion gap 8 5 - 15    Comment: Performed at [facility] [address] [place], [place] [address]  CBC     Status: Abnormal   Collection Time: 09/15/21  3:39 AM  Result Value Ref Range   WBC 14.6 (H) 4.0 - 10.5 K/uL   RBC 3.57 (L) 3.87 - 5.11 MIL/uL   Hemoglobin 11.1 (L) 12.0 - 15.0 g/dL   HCT 33.2 (L) 36.0 - 46.0 %   MCV 93.0 80.0 - 100.0 fL   MCH 31.1 26.0 - 34.0 pg   MCHC 33.4 30.0 - 36.0 g/dL   RDW 12.7 11.5 - 15.5 %   Platelets 260 150 - 400 K/uL   nRBC 0.0 0.0 - 0.2 %    Comment: Performed at [facility],  [place] [address] [place], [place] [address]  Magnesium     Status: None   Collection Time: 09/15/21  3:39 AM  Result Value Ref Range   Magnesium 1.9 1.7 - 2.4 mg/dL    Comment: Performed at [facility] [address], [place], [place] [address]  Glucose, capillary     Status: Abnormal   Collection Time: 09/15/21  5:28 AM  Result Value Ref Range   Glucose-Capillary 298 (H) 70 - 99 mg/dL    Comment: Glucose reference range applies only to samples taken after fasting for at least 8 hours.  Glucose, capillary     Status: Abnormal   Collection Time: 09/15/21  7:28 AM  Result Value Ref Range   Glucose-Capillary 292 (H) 70 - 99 mg/dL    Comment: Glucose reference range applies only to samples taken after fasting for at least 8 hours.    MR LUMBAR SPINE WO CONTRAST  Result Date: 09/13/2021 CLINICAL DATA:  Low back pain. EXAM: MRI LUMBAR SPINE WITHOUT CONTRAST TECHNIQUE: Multiplanar, multisequence MR imaging of the lumbar spine was performed. No intravenous contrast was administered. COMPARISON:  Radiographs September 09, 2021. FINDINGS: Segmentation:  Standard. Alignment:  Physiologic. Vertebrae: No fracture, evidence of discitis, or bone lesion. Endplate degenerative changes at L5-S1. Conus medullaris and cauda  equina: Conus extends to the L1 level. Conus and cauda equina appear normal. Paraspinal and other soft tissues: Nodular thickening of the bilateral adrenal glands as seen on prior CT of the abdomen performed on June 10, 2020 Disc levels: T12-L1: Shallow disc bulge. No significant spinal canal or neural foraminal stenosis. L1-2: Shallow disc bulge and mild facet degenerative changes without significant spinal canal or neural foraminal. L2-3: Mild facet degenerative changes. No significant spinal canal or neural foraminal stenosis L3-4: Disc bulge, moderate facet degenerative change ligamentum flavum redundancy resulting in mild spinal canal stenosis and mild bilateral neural foraminal narrowing.  L4-5: Disc bulge, mild-to-moderate facet degenerative changes ligamentum flavum redundancy resulting in mild spinal canal stenosis and mild bilateral neural foraminal narrowing. L5-S1 disc bulge, mild-to-moderate facet degenerative changes and ligamentum flavum redundancy resulting in moderate bilateral neural foraminal narrowing, right greater than left he. No significant spinal canal stenosis. IMPRESSION: 1. Degenerative changes of the lumbar spine, more pronounced at L5-S1 where there is moderate bilateral neural foraminal narrowing, right greater than left, with possible impingement on the bilateral exiting L5 nerve roots. 2. No high-grade spinal canal stenosis at any level. Electronically Signed   By: Pedro Earls M.D.   On: 09/13/2021 11:02    Review of Systems  Constitutional:  Negative for chills, diaphoresis and fever.  HENT:  Negative for ear discharge, ear pain, hearing loss and tinnitus.   Eyes:  Negative for photophobia and pain.  Respiratory:  Negative for cough and shortness of breath.   Cardiovascular:  Negative for chest pain.  Gastrointestinal:  Negative for abdominal pain, nausea and vomiting.  Genitourinary:  Negative for dysuria, flank pain, frequency and urgency.  Musculoskeletal:  Positive for arthralgias (Right ankle) and myalgias (RLE). Negative for back pain and neck pain.  Neurological:  Negative for dizziness and headaches.  Hematological:  Does not bruise/bleed easily.  Psychiatric/Behavioral:  The patient is not nervous/anxious.   Blood pressure 131/72, pulse 69, temperature 98.6 F (37 C), temperature source Oral, resp. rate 16, height 5\' 2"  (1.575 m), weight 75.7 kg, SpO2 93 %. Physical Exam Constitutional:      General: She is not in acute distress.    Appearance: She is well-developed. She is not diaphoretic.  HENT:     Head: Normocephalic and atraumatic.  Eyes:     General: No scleral icterus.       Right eye: No discharge.        Left eye:  No discharge.     Conjunctiva/sclera: Conjunctivae normal.  Cardiovascular:     Rate and Rhythm: Normal rate and regular rhythm.  Pulmonary:     Effort: Pulmonary effort is normal. No respiratory distress.  Musculoskeletal:     Cervical back: Normal range of motion.     Comments: LLE No traumatic wounds, ecchymosis, or rash  Mod TTP ant ankle, minimal TTP med/lat ankle, painless PROM ankle, severe pain with AROM ankle, no edema or ecchymosis  No knee or ankle effusion  Knee stable to varus/ valgus and anterior/posterior stress  Sens DPN, SPN, TN intact  Motor EHL, ext, flex, evers limited by pain  DP 2+, PT 1+, No significant edema  Skin:    General: Skin is warm and dry.  Neurological:     Mental Status: She is alert.  Psychiatric:        Mood and Affect: Mood normal.        Behavior: Behavior normal.    Assessment/Plan: Left ankle pain -- I  do not believe her pain is orthopedic in nature. No e/o arthropathy or sprain. Will provide a CAM boot for support but doubtful it will help and she may use prn. WBAT LLE.    Lisette Abu, PA-C Orthopedic Surgery [phone] 09/15/2021, 10:10 AM

## 2021-09-15 NOTE — Consult Note (Signed)
[facility] CM Inpatient Consult   09/15/2021  Amanda Mendez January 29, 1956 [id number]  [facility] Follow Up:  Spoke with patient at bedside and explained [name] services for assistance with chronic disease management. Referral received from inpatient diabetes coordinator for post hospital follow up with care management team. Patient verbalizes consent for post hospital follow up with [name].   Plan: Continue to follow for progression.  Of note, [name] does not replace or interfere with any services that are arranged by inpatient case management or social work.   [name], MSN, [place] [facility] Liaison Nurse Mobile Phone [phone]  Toll free office [phone]

## 2021-09-15 NOTE — Progress Notes (Signed)
Orthopedic Tech Progress Note Patient Details:  Amanda Mendez [birth year] [id number] Left cam walker in room Ortho Devices Type of Ortho Device: CAM walker Ortho Device/Splint Interventions: Ordered      [name] A [name] 09/15/2021, 1:47 PM

## 2021-09-15 NOTE — TOC Transition Note (Signed)
Transition of Care [facility]) - CM/SW Discharge Note   Patient Details  Name: Amanda Mendez MRN: [id number] Date of Birth: [birth year]  [facility] CM/SW Contact:  Curlene Labrum, RN Phone Number: 09/15/2021, 1:42 PM   Clinical Narrative:    Case management met with the patient at the bedside to discuss transitions of care to home - most likely tomorrow.  The patient states that she lives with her daughter at the home and is concerned about falling and is hopeful that the CAM walker will assist with this matter.  The daughter was in the room and she asked if the patient would be able to go to physical rehabilitation before going home.  At this time, the patient is mobilizing with a rolling walker and will start using the CAM walker that is present in the room.  PT is recommending home health at this time and I explained this to the patient and she states that she will continue to work with therapy and she how she progresses relating to mobility and pain.  I called Dr. Dwyane Dee and made him aware about the patient/ daughter's concerns about her risk of falling at home.  Patient is set up for home health services with [facility] when/if she is able to discharge to home.  RW, 3:1 and wheelchair are present in the room from [place].  CM will continue to follow the patient for discharge needs - possible discharge home this weekend once the patient's pain and mobility are improved per the physician.   Final next level of care: [place] Barriers to Discharge:  (Patient with uncontrolled pain and instability with ambulation.)   Patient Goals and CMS Choice Patient states their goals for this hospitalization and ongoing recovery are:: Patient wants to go home if possible. CMS Medicare.gov Compare Post Acute Care list provided to:: Patient Choice offered to / list presented to : Patient  Discharge Placement                       Discharge Plan and Services    Discharge Planning Services: CM Consult Post Acute Care Choice: Durable Medical Equipment, Home Health          DME Arranged: Walker rolling, 3-N-1, Youth worker wheelchair with seat cushion DME Agency: [employer] Date DME Agency Contacted: 09/13/21 Time DME Agency Contacted: [phone] Representative spoke with at DME Agency: [facility] HH Arranged: PT, OT [place] Agency: [place] Date [place]: 09/13/21 Time [place]: 1633 Representative spoke with at [place]: [place] ([place]) Interventions     Readmission Risk Interventions No flowsheet data found.

## 2021-09-16 DIAGNOSIS — M79605 Pain in left leg: Secondary | ICD-10-CM | POA: Diagnosis not present

## 2021-09-16 DIAGNOSIS — W19XXXD Unspecified fall, subsequent encounter: Secondary | ICD-10-CM | POA: Diagnosis not present

## 2021-09-16 LAB — MAGNESIUM: Magnesium: 2.1 mg/dL (ref 1.7–2.4)

## 2021-09-16 LAB — CBC
HCT: 34.8 % — ABNORMAL LOW (ref 36.0–46.0)
Hemoglobin: 11.9 g/dL — ABNORMAL LOW (ref 12.0–15.0)
MCH: 31.6 pg (ref 26.0–34.0)
MCHC: 34.2 g/dL (ref 30.0–36.0)
MCV: 92.3 fL (ref 80.0–100.0)
Platelets: 311 10*3/uL (ref 150–400)
RBC: 3.77 MIL/uL — ABNORMAL LOW (ref 3.87–5.11)
RDW: 12.7 % (ref 11.5–15.5)
WBC: 17.9 10*3/uL — ABNORMAL HIGH (ref 4.0–10.5)
nRBC: 0 % (ref 0.0–0.2)

## 2021-09-16 LAB — BASIC METABOLIC PANEL
Anion gap: 9 (ref 5–15)
BUN: 31 mg/dL — ABNORMAL HIGH (ref 8–23)
CO2: 24 mmol/L (ref 22–32)
Calcium: 9.2 mg/dL (ref 8.9–10.3)
Chloride: 102 mmol/L (ref 98–111)
Creatinine, Ser: 1.25 mg/dL — ABNORMAL HIGH (ref 0.44–1.00)
GFR, Estimated: 48 mL/min — ABNORMAL LOW (ref >60)
Glucose, Bld: 221 mg/dL — ABNORMAL HIGH (ref 70–99)
Potassium: 4.1 mmol/L (ref 3.5–5.1)
Sodium: 135 mmol/L (ref 135–145)

## 2021-09-16 LAB — GLUCOSE, CAPILLARY
Glucose-Capillary: 261 mg/dL — ABNORMAL HIGH (ref 70–99)
Glucose-Capillary: 376 mg/dL — ABNORMAL HIGH (ref 70–99)
Glucose-Capillary: 434 mg/dL — ABNORMAL HIGH (ref 70–99)

## 2021-09-16 MED ORDER — OXYCODONE HCL 5 MG PO TABS: 5.0000 mg | ORAL_TABLET | ORAL | 0 refills | Status: DC | PRN

## 2021-09-16 MED ORDER — SENNOSIDES-DOCUSATE SODIUM 8.6-50 MG PO TABS: 1.0000 | ORAL_TABLET | Freq: Every evening | ORAL | 0 refills | Status: AC | PRN

## 2021-09-16 MED ORDER — LOSARTAN POTASSIUM 100 MG PO TABS: 100.0000 mg | ORAL_TABLET | Freq: Every day | ORAL | 0 refills | Status: DC

## 2021-09-16 MED ORDER — POLYETHYLENE GLYCOL 3350 17 G PO PACK
17.0000 g | PACK | Freq: Every day | ORAL | 0 refills | Status: DC | PRN
Start: 2021-09-16 — End: 2022-08-22

## 2021-09-16 NOTE — Progress Notes (Signed)
Per transition of care pt is set up with home health and has her equipment in her room.   Reviewed AVS with patient. Pt stated understanding. All questions answered. No further questions or concerns at the present time.

## 2021-09-16 NOTE — Progress Notes (Signed)
Physical Therapy Treatment Patient Details Name: Amanda Mendez MRN: [id number] DOB: Feb 20, 1956 Today's Date: 09/16/2021   History of Present Illness 65 y.o. female presented to the ED 09/10/21 with c/o a fall after LLE gave way due to chronic pain while getting up from a seated position, with resultant trauma to her head, sustaining lacerations. Pt down for approx 5 hours before family found her, reports 2 bouts of LoC.  Head, cervical pelvic and left wrist xray all without fracture or acute abnormality. No acute abnormalities in head CT or pelvic, L wrist and hand x-ray. Poor blood flow found to bilateral LE R>L. S/p aortogram with BLE run off 10/4 without evidence of major blockage and did not require intervention. MRI of spine 10/5 revealed moderate bilateral neural foraminal narrowing L5-S1, right greater than left, with possible impingement on the bilateral exiting L5 nerve roots. PMH: DM, HTN, CAD, Diastolic CHF.    PT Comments    Pt received in chair, agreeable to PT session and with good participation and tolerance for gait and transfer training. Pt with fair safety awareness and progressed to modI for transfers from chair/toilet and supervision for gait with assistive device when wearing CAM boot. Pt continues to benefit from PT services to progress toward functional mobility goals. Anticipate pt safe to DC home with increased PRN supervision/assist from family once medically cleared.   Recommendations for follow up therapy are one component of a multi-disciplinary discharge planning process, led by the attending physician.  Recommendations may be updated based on patient status, additional functional criteria and insurance authorization.  Follow Up Recommendations  Home health PT;Supervision for mobility/OOB     Equipment Recommendations  Rolling walker with 5" wheels;3in1 (PT)    Recommendations for Other Services       Precautions / Restrictions Precautions Precautions:  Fall Restrictions Weight Bearing Restrictions: No     Mobility  Bed Mobility               General bed mobility comments: Pt sitting up in recliner upon arrival.    Transfers Overall transfer level: Modified independent Equipment used: Rolling walker (2 wheeled) Transfers: Sit to/from Stand Sit to Stand: Modified independent (Device/Increase time)         General transfer comment: good hand placement; cues only to use wall rail when sitting on low toilet seat  Ambulation/Gait Ambulation/Gait assistance: Supervision Gait Distance (Feet): 60 Feet (60 + 37ft with seated break) Assistive device: Rolling walker (2 wheeled) Gait Pattern/deviations: Step-through pattern;Antalgic;Decreased stride length;Trunk flexed Gait velocity: reduced Gait velocity interpretation: <1.31 ft/sec, indicative of household ambulator General Gait Details: good use of RW and posture, no LOB with turns/directional changes and pt reports improved comfort with use of RW   Stairs             Wheelchair Mobility    Modified Rankin (Stroke Patients Only)       Balance Overall balance assessment: Needs assistance Sitting-balance support: No upper extremity supported;Feet supported Sitting balance-Leahy Scale: Good     Standing balance support: Bilateral upper extremity supported;Single extremity supported Standing balance-Leahy Scale: Poor Standing balance comment: UE support and supervision with RW                            Cognition Arousal/Alertness: Awake/alert Behavior During Therapy: WFL for tasks assessed/performed;Anxious Overall Cognitive Status: Within Functional Limits for tasks assessed  General Comments: easily distracted with tangential conversation but pleasant and participatory. fair safety awareness, chair alarm on pre/post.      Exercises General Exercises - Lower Extremity Long Arc Quad: AROM;Left;5  reps;Seated Straight Leg Raises: AROM;Left;5 reps;Supine    General Comments General comments (skin integrity, edema, etc.): no acute s/sx distress throughout      Pertinent Vitals/Pain Pain Assessment: Faces Faces Pain Scale: Hurts a little bit Pain Location: LLE Pain Descriptors / Indicators: Discomfort Pain Intervention(s): Limited activity within patient's tolerance;Monitored during session;Repositioned    Home Living                      Prior Function            PT Goals (current goals can now be found in the care plan section) Acute Rehab PT Goals Patient Stated Goal: have less pain in L LE PT Goal Formulation: With patient Time For Goal Achievement: 09/26/21 Potential to Achieve Goals: Good Progress towards PT goals: Progressing toward goals    Frequency    Min 3X/week      PT Plan Current plan remains appropriate    Co-evaluation              AM-PAC PT "6 Clicks" Mobility   Outcome Measure  Help needed turning from your back to your side while in a flat bed without using bedrails?: None Help needed moving from lying on your back to sitting on the side of a flat bed without using bedrails?: None Help needed moving to and from a bed to a chair (including a wheelchair)?: None Help needed standing up from a chair using your arms (e.g., wheelchair or bedside chair)?: None Help needed to walk in hospital room?: A Little Help needed climbing 3-5 steps with a railing? : A Little 6 Click Score: 22    End of Session Equipment Utilized During Treatment: Gait belt Activity Tolerance: Patient tolerated treatment well Patient left: in chair;with call bell/phone within reach;with chair alarm set Nurse Communication: Mobility status PT Visit Diagnosis: Unsteadiness on feet (R26.81);Other abnormalities of gait and mobility (R26.89);Muscle weakness (generalized) (M62.81);Repeated falls (R29.6);History of falling (Z91.81);Pain;Difficulty in walking, not  elsewhere classified (R26.2) Pain - Right/Left: Left Pain - part of body: Leg;Ankle and joints of foot     Time: 1510-1525 PT Time Calculation (min) (ACUTE ONLY): 15 min  Charges:  $Gait Training: 8-22 mins                     Mylea Roarty P., PTA Acute Rehabilitation Services Pager: [phone] Office: [place] 09/16/2021, 3:54 PM

## 2021-09-16 NOTE — TOC Transition Note (Signed)
Transition of Care [facility]) - CM/SW Discharge Note   Patient Details  Name: Amanda Mendez MRN: [id number] Date of Birth: 1956-11-22  Transition of Care [facility]) CM/SW Contact:  Verdell Carmine, RN Phone Number: 09/16/2021, 10:53 AM   Clinical Narrative:    Patient discharging today, has DME and Home Health set up.   Final next level of care: [place] Barriers to Discharge: No Barriers Identified   Patient Goals and CMS Choice Patient states their goals for this hospitalization and ongoing recovery are:: Patient wants to go home if possible. CMS Medicare.gov Compare Post Acute Care list provided to:: Patient Choice offered to / list presented to : Patient  Discharge Placement               Home with Home Health        Discharge Plan and Services   Discharge Planning Services: CM Consult Post Acute Care Choice: Durable Medical Equipment, Home Health          DME Arranged: Walker rolling, 3-N-1, Youth worker wheelchair with seat cushion DME Agency: [employer] Date DME Agency Contacted: 09/13/21 Time DME Agency Contacted: [phone] Representative spoke with at DME Agency: Brenton Grills HH Arranged: PT, OT [place] Agency: [place] Date [place]: 09/13/21 Time [place]: 1633 Representative spoke with at [place]: Maurice (Will) Interventions     Readmission Risk Interventions No flowsheet data found.

## 2021-09-18 ENCOUNTER — Other Ambulatory Visit: Payer: Self-pay

## 2021-09-18 DIAGNOSIS — E1369 Other specified diabetes mellitus with other specified complication: Secondary | ICD-10-CM

## 2021-09-19 ENCOUNTER — Telehealth: Payer: Self-pay | Admitting: Pharmacist

## 2021-09-19 DIAGNOSIS — Z87442 Personal history of urinary calculi: Secondary | ICD-10-CM | POA: Diagnosis not present

## 2021-09-19 DIAGNOSIS — Z7982 Long term (current) use of aspirin: Secondary | ICD-10-CM | POA: Diagnosis not present

## 2021-09-19 DIAGNOSIS — E119 Type 2 diabetes mellitus without complications: Secondary | ICD-10-CM | POA: Diagnosis not present

## 2021-09-19 DIAGNOSIS — K219 Gastro-esophageal reflux disease without esophagitis: Secondary | ICD-10-CM | POA: Diagnosis not present

## 2021-09-19 DIAGNOSIS — E785 Hyperlipidemia, unspecified: Secondary | ICD-10-CM

## 2021-09-19 DIAGNOSIS — Z9181 History of falling: Secondary | ICD-10-CM | POA: Diagnosis not present

## 2021-09-19 DIAGNOSIS — S0180XD Unspecified open wound of other part of head, subsequent encounter: Secondary | ICD-10-CM | POA: Diagnosis not present

## 2021-09-19 DIAGNOSIS — E7800 Pure hypercholesterolemia, unspecified: Secondary | ICD-10-CM | POA: Diagnosis not present

## 2021-09-19 DIAGNOSIS — Z8701 Personal history of pneumonia (recurrent): Secondary | ICD-10-CM | POA: Diagnosis not present

## 2021-09-19 DIAGNOSIS — I5032 Chronic diastolic (congestive) heart failure: Secondary | ICD-10-CM | POA: Diagnosis not present

## 2021-09-19 DIAGNOSIS — I110 Hypertensive heart disease with heart failure: Secondary | ICD-10-CM | POA: Diagnosis not present

## 2021-09-19 DIAGNOSIS — I2510 Atherosclerotic heart disease of native coronary artery without angina pectoris: Secondary | ICD-10-CM | POA: Diagnosis not present

## 2021-09-19 DIAGNOSIS — Z7984 Long term (current) use of oral hypoglycemic drugs: Secondary | ICD-10-CM | POA: Diagnosis not present

## 2021-09-19 NOTE — Telephone Encounter (Signed)
LFT were checked while she was in the hospital. LFT were normal. She will need her lipids checked in the next month or so. She should continue rosuvastatin 10mg  daily.  Called patient to review and schedule lipid panel. LVM for her to call back.

## 2021-09-21 ENCOUNTER — Encounter: Payer: Self-pay | Admitting: *Deleted

## 2021-09-21 ENCOUNTER — Other Ambulatory Visit: Payer: Self-pay | Admitting: *Deleted

## 2021-09-21 NOTE — Patient Outreach (Signed)
[facility]) Care Management  09/21/2021  Tyauna Jons [birth year] [id number]   [facility] outreach to post hospital referred patient  Mrs Mayzie Caughlin was referred to [facility] for post hospital, complex care and disease management services on 09/18/21 after she discharged from the hospital on 09/16/21  Insurance blue cross and blue shield, Medicare  Transition of care services noted to be completed by primary care MD office staff -[facility]  Transition of Care will be completed by primary care provider office who will refer to [facility] care management if needed.  Outreach to [phone] Carrie answered and confirmed with RN CM she is Mrs Galindo's daughter in law who Mrs Storie lives with She was only able to speak briefly today as she was coordinating care for her father telephonically but agreed to further outreach    Assessment [name] reports Mrs Fratus is doing fair  She is using being followed by [facility] home health physical therapy She is scheduled to be seen at 0830 on 09/22/21  She has followed up with her primary care provider (PCP) and had her stitches removed  She is confirmed to have all her ordered medications  Fall  Their fall safety plan is to monitor her pain medication outside of her reach, keep the patient's bedroom door open, using a walker for ambulation and a use of a walkie talkie   Home support Mrs Starkes is supported by [name] who reports she is 65 years of age with medical issues to include osteoarthritis [name] does share soon her 66 year old hard of hearing father will be moving into the home  Patient Active Problem List   Diagnosis Date Noted   Fall 09/10/2021   Left leg pain 09/10/2021   Need for 23-polyvalent pneumococcal polysaccharide vaccine 09/25/2016   Need for immunization against influenza 09/25/2016   Gastroesophageal reflux disease without esophagitis 06/25/2016   Dyslipidemia (high LDL; low HDL) 03/26/2016   Vitamin D  deficiency 06/16/2015   Obesity (BMI 30-39.9) 03/24/2015   Gastroenteritis, infectious 05/29/2014   Abdominal pain 05/28/2014   HLD (hyperlipidemia) 05/28/2014   Atherosclerotic heart disease of native coronary artery without angina pectoris 09/03/2012   DM type 2 with diabetic dyslipidemia ([place]) 08/14/2012   Essential (primary) hypertension 08/14/2012   Unstable angina ([place]) 41/96/2229   Acute diastolic heart failure ([place]) 08/13/2012      Past Medical History:  Diagnosis Date   Anginal pain ([place])    Coronary artery disease    angioplasty 9/13   Exertional dyspnea    "usually; today it was with nothing" (11/03/2012)   GERD (gastroesophageal reflux disease)    Hypercholesterolemia with hypertriglyceridemia    Hypertension    Kidney stones    "I've had them 5 times; lithotripsy 1st time; flushed out  all the others" (11/03/2012)   Obesity (BMI 30-39.9)    Pneumonia 2012   Sinus headache    "weekly" (11/03/2012)   Type II diabetes mellitus ([place]) 2007    Current Outpatient Medications on File Prior to Visit  Medication Sig Dispense Refill   aspirin EC 81 MG tablet Take 81 mg by mouth daily. Swallow whole.     Cholecalciferol (VITAMIN D3) 1.25 MG (50000 UT) TABS Take 5,000 Units by mouth.     cloNIDine (CATAPRES) 0.1 MG tablet Take 0.1 mg by mouth 2 (two) times daily.     gabapentin (NEURONTIN) 300 MG capsule Take 300 mg by mouth 3 (three) times daily.     glipiZIDE (GLUCOTROL XL)  10 MG 24 hr tablet Take 10 mg by mouth 2 (two) times daily at 8 am and 10 pm.     ipratropium (ATROVENT) 0.06 % nasal spray 2 sprays into each nostril Three (3) times a day.     losartan (COZAAR) 100 MG tablet Take 1 tablet (100 mg total) by mouth daily. 30 tablet 0   metoprolol (LOPRESSOR) 50 MG tablet Take 50 mg by mouth 2 (two) times daily.     Multiple Vitamin (MULTIVITAMIN) capsule Take 1 capsule by mouth daily.     omeprazole (PRILOSEC) 20 MG capsule Take 20 mg by mouth daily.     oxyCODONE  (OXY IR/ROXICODONE) 5 MG immediate release tablet Take 1 tablet (5 mg total) by mouth every 4 (four) hours as needed for moderate pain or severe pain. 30 tablet 0   polyethylene glycol (MIRALAX / GLYCOLAX) 17 g packet Take 17 g by mouth daily as needed for mild constipation. 14 each 0   rosuvastatin (CRESTOR) 10 MG tablet Take 1 tablet (10 mg total) by mouth daily. 90 tablet 3   senna-docusate (SENOKOT-S) 8.6-50 MG tablet Take 1 tablet by mouth at bedtime as needed for moderate constipation. 30 tablet 0   sitaGLIPtin (JANUVIA) 100 MG tablet Take 100 mg by mouth daily.     No current facility-administered medications on file prior to visit.     Plans Patient agrees to care plan and follow up within the next 10 business days  Marlon Suleiman L. Lavina Hamman, RN, BSN, [place] Coordinator Office number [phone] [facility] number [phone] Fax number [phone]

## 2021-09-22 DIAGNOSIS — E119 Type 2 diabetes mellitus without complications: Secondary | ICD-10-CM | POA: Diagnosis not present

## 2021-09-22 DIAGNOSIS — Z9181 History of falling: Secondary | ICD-10-CM | POA: Diagnosis not present

## 2021-09-22 DIAGNOSIS — Z7984 Long term (current) use of oral hypoglycemic drugs: Secondary | ICD-10-CM | POA: Diagnosis not present

## 2021-09-22 DIAGNOSIS — I110 Hypertensive heart disease with heart failure: Secondary | ICD-10-CM | POA: Diagnosis not present

## 2021-09-22 DIAGNOSIS — I5032 Chronic diastolic (congestive) heart failure: Secondary | ICD-10-CM | POA: Diagnosis not present

## 2021-09-22 DIAGNOSIS — K219 Gastro-esophageal reflux disease without esophagitis: Secondary | ICD-10-CM | POA: Diagnosis not present

## 2021-09-22 DIAGNOSIS — S0180XD Unspecified open wound of other part of head, subsequent encounter: Secondary | ICD-10-CM | POA: Diagnosis not present

## 2021-09-22 DIAGNOSIS — Z87442 Personal history of urinary calculi: Secondary | ICD-10-CM | POA: Diagnosis not present

## 2021-09-22 DIAGNOSIS — E669 Obesity, unspecified: Secondary | ICD-10-CM | POA: Diagnosis not present

## 2021-09-22 DIAGNOSIS — E1165 Type 2 diabetes mellitus with hyperglycemia: Secondary | ICD-10-CM | POA: Diagnosis not present

## 2021-09-22 DIAGNOSIS — E7800 Pure hypercholesterolemia, unspecified: Secondary | ICD-10-CM | POA: Diagnosis not present

## 2021-09-22 DIAGNOSIS — I2510 Atherosclerotic heart disease of native coronary artery without angina pectoris: Secondary | ICD-10-CM | POA: Diagnosis not present

## 2021-09-22 DIAGNOSIS — I10 Essential (primary) hypertension: Secondary | ICD-10-CM | POA: Diagnosis not present

## 2021-09-22 DIAGNOSIS — Z6830 Body mass index (BMI) 30.0-30.9, adult: Secondary | ICD-10-CM | POA: Diagnosis not present

## 2021-09-22 DIAGNOSIS — Z8701 Personal history of pneumonia (recurrent): Secondary | ICD-10-CM | POA: Diagnosis not present

## 2021-09-22 DIAGNOSIS — Z7982 Long term (current) use of aspirin: Secondary | ICD-10-CM | POA: Diagnosis not present

## 2021-09-22 DIAGNOSIS — G720 Drug-induced myopathy: Secondary | ICD-10-CM | POA: Diagnosis not present

## 2021-09-26 DIAGNOSIS — E119 Type 2 diabetes mellitus without complications: Secondary | ICD-10-CM | POA: Diagnosis not present

## 2021-09-26 DIAGNOSIS — I5032 Chronic diastolic (congestive) heart failure: Secondary | ICD-10-CM | POA: Insufficient documentation

## 2021-09-26 DIAGNOSIS — I2510 Atherosclerotic heart disease of native coronary artery without angina pectoris: Secondary | ICD-10-CM | POA: Diagnosis not present

## 2021-09-26 DIAGNOSIS — Z7982 Long term (current) use of aspirin: Secondary | ICD-10-CM | POA: Diagnosis not present

## 2021-09-26 DIAGNOSIS — Z8701 Personal history of pneumonia (recurrent): Secondary | ICD-10-CM | POA: Diagnosis not present

## 2021-09-26 DIAGNOSIS — Z87442 Personal history of urinary calculi: Secondary | ICD-10-CM | POA: Diagnosis not present

## 2021-09-26 DIAGNOSIS — Z9181 History of falling: Secondary | ICD-10-CM | POA: Diagnosis not present

## 2021-09-26 DIAGNOSIS — E7800 Pure hypercholesterolemia, unspecified: Secondary | ICD-10-CM | POA: Diagnosis not present

## 2021-09-26 DIAGNOSIS — I110 Hypertensive heart disease with heart failure: Secondary | ICD-10-CM | POA: Diagnosis not present

## 2021-09-26 DIAGNOSIS — K219 Gastro-esophageal reflux disease without esophagitis: Secondary | ICD-10-CM | POA: Diagnosis not present

## 2021-09-26 DIAGNOSIS — Z7984 Long term (current) use of oral hypoglycemic drugs: Secondary | ICD-10-CM | POA: Diagnosis not present

## 2021-09-26 DIAGNOSIS — S0180XD Unspecified open wound of other part of head, subsequent encounter: Secondary | ICD-10-CM | POA: Diagnosis not present

## 2021-09-26 NOTE — Progress Notes (Signed)
Cardiology Office Note   Date:  09/27/2021   ID:  Amanda Mendez, DOB 08-15-1956, MRN [id number]  PCP:  Redmond School, MD  Cardiologist:   None Referring:  Redmond School, MD  Chief Complaint  Patient presents with   Leg Pain        History of Present Illness: Amanda Mendez is a 65 y.o. female who presents for evaluation of coronary artery disease and history of difficult to control hypertension.   She has had coronary artery disease in the distant past.  She has had a history of chronic diastolic dysfunction.  Last echocardiogram in 2015 suggested moderate left ventricular hypertrophy but well-preserved ejection fraction.  There is a somewhat suboptimal study.  I saw her recently and we tried to get her more engaged in risk reduction and risk factor control.  She was subsequently in the hospital earlier this month.  I reviewed these records for this visit.   She had a fall.  She had severe leg pain but she did not have significant stenosis on peripheral angiogram.     She did have her HCTZ reduced because her creatinine was up although it does not appear to be markedly increased from baseline.  She still having the leg pain which is now unexplained.  Its down her entire left leg.  She is going to see neurology and orthopedic.  She was told she had some disc disease.  She denies any chest pressure.  When she was hospitalized she had been on pain medication.  She did fall and thinks it was related to that.  She did not report passing out.  She has not had any palpitations.  She has had no new shortness of breath.  She is now on a walking cast and getting around with a walker.   Past Medical History:  Diagnosis Date   Anginal pain ([place])    Coronary artery disease    angioplasty 9/13   Exertional dyspnea    "usually; today it was with nothing" (11/03/2012)   GERD (gastroesophageal reflux disease)    Hypercholesterolemia with hypertriglyceridemia    Hypertension    Kidney  stones    "I've had them 5 times; lithotripsy 1st time; flushed out  all the others" (11/03/2012)   Obesity (BMI 30-39.9)    Pneumonia 2012   Sinus headache    "weekly" (11/03/2012)   Type II diabetes mellitus ([place]) 2007    Past Surgical History:  Procedure Laterality Date   ABDOMINAL AORTOGRAM W/LOWER EXTREMITY Left 09/12/2021   Procedure: ABDOMINAL AORTOGRAM W/LOWER EXTREMITY;  Surgeon: Serafina Mitchell, MD;  Location: [place] [facility];  Service: Cardiovascular;  Laterality: Left;   CARDIAC CATHETERIZATION  11/25/29013   CORONARY ANGIOPLASTY WITH STENT PLACEMENT  08/2012   "1"   HEMATOMA EVACUATION  09/22/2012   Procedure: EVACUATION HEMATOMA;  Surgeon: Wynonia Sours, MD;  Location: [place];  Service: Orthopedics;  Laterality: Right;  Evacuation Hematoma right hand   LEFT AND RIGHT HEART CATHETERIZATION WITH CORONARY ANGIOGRAM N/A 08/14/2012   Procedure: LEFT AND RIGHT HEART CATHETERIZATION WITH CORONARY ANGIOGRAM;  Surgeon: Minus Breeding, MD;  Location: [facility] CATH LAB;  Service: Cardiovascular;  Laterality: N/A;   LEFT HEART CATHETERIZATION WITH CORONARY ANGIOGRAM N/A 11/03/2012   Procedure: LEFT HEART CATHETERIZATION WITH CORONARY ANGIOGRAM;  Surgeon: Jolaine Artist, MD;  Location[facility] CATH LAB;  Service: Cardiovascular;  Laterality: N/A;   LITHOTRIPSY  1990's   PERCUTANEOUS CORONARY STENT INTERVENTION (PCI-S)  08/14/2012  Procedure: PERCUTANEOUS CORONARY STENT INTERVENTION (PCI-S);  Surgeon: Minus Breeding, MD;  Location: [facility] CATH LAB;  Service: Cardiovascular;;   SKIN BIOPSY  2012   "off my back; it was nothing fatal; infection caused it" (11/03/2012)   TUBAL LIGATION  1980     Current Outpatient Medications  Medication Sig Dispense Refill   aspirin EC 81 MG tablet Take 81 mg by mouth daily. Swallow whole.     Cholecalciferol (VITAMIN D3) 1.25 MG (50000 UT) TABS Take 5,000 Units by mouth.     cloNIDine (CATAPRES) 0.1 MG tablet Take 0.1 mg by mouth 2 (two) times  daily.     gabapentin (NEURONTIN) 300 MG capsule Take 300 mg by mouth 3 (three) times daily.     glipiZIDE (GLUCOTROL XL) 10 MG 24 hr tablet Take 10 mg by mouth 2 (two) times daily at 8 am and 10 pm.     ipratropium (ATROVENT) 0.06 % nasal spray 2 sprays into each nostril Three (3) times a day.     losartan (COZAAR) 100 MG tablet Take 1 tablet (100 mg total) by mouth daily. 30 tablet 0   metoprolol (LOPRESSOR) 50 MG tablet Take 50 mg by mouth 2 (two) times daily.     Multiple Vitamin (MULTIVITAMIN) capsule Take 1 capsule by mouth daily.     omeprazole (PRILOSEC) 20 MG capsule Take 20 mg by mouth daily.     oxyCODONE (OXY IR/ROXICODONE) 5 MG immediate release tablet Take 1 tablet (5 mg total) by mouth every 4 (four) hours as needed for moderate pain or severe pain. 30 tablet 0   polyethylene glycol (MIRALAX / GLYCOLAX) 17 g packet Take 17 g by mouth daily as needed for mild constipation. 14 each 0   rosuvastatin (CRESTOR) 10 MG tablet Take 1 tablet (10 mg total) by mouth daily. 90 tablet 3   senna-docusate (SENOKOT-S) 8.6-50 MG tablet Take 1 tablet by mouth at bedtime as needed for moderate constipation. 30 tablet 0   sitaGLIPtin (JANUVIA) 100 MG tablet Take 100 mg by mouth daily.     No current facility-administered medications for this visit.    Allergies:   Dapagliflozin and Codeine    ROS:  Please see the history of present illness.   Otherwise, review of systems are positive for none.   All other systems are reviewed and negative.    PHYSICAL EXAM: VS:  BP 140/80   Pulse 76   Ht 5\' 2"  (1.575 m)   Wt 165 lb (74.8 kg)   BMI 30.18 kg/m  , BMI Body mass index is 30.18 kg/m. GENERAL:  Well appearing NECK:  No jugular venous distention, waveform within normal limits, carotid upstroke brisk and symmetric, no bruits, no thyromegaly LUNGS:  Clear to auscultation bilaterally CHEST:  Unremarkable HEART:  PMI not displaced or sustained,S1 and S2 within normal limits, no S3, no S4, no  clicks, no rubs, no murmurs,  ABD:  Flat, positive bowel sounds normal in frequency in pitch, no bruits, no rebound, no guarding, no midline pulsatile mass, no hepatomegaly, no splenomegaly EXT:  2 plus pulses upper, mild decreased dorsalis pedis bilaterally, no edema, positive cyanosis no clubbing   EKG:  EKG not ordered today.    Recent Labs: 09/10/2021: ALT 30 09/12/2021: TSH 3.913 09/16/2021: BUN 31; Creatinine, Ser 1.25; Hemoglobin 11.9; Magnesium 2.1; Platelets 311; Potassium 4.1; Sodium 135    Lipid Panel    Component Value Date/Time   CHOL 275 (H) 08/14/2012 0251   TRIG 557 (H) 08/14/2012  0251   HDL 43 08/14/2012 0251   CHOLHDL 6.4 08/14/2012 0251   VLDL UNABLE TO CALCULATE IF TRIGLYCERIDE OVER 400 mg/dL 08/14/2012 0251   LDLCALC UNABLE TO CALCULATE IF TRIGLYCERIDE OVER 400 mg/dL 08/14/2012 0251      Wt Readings from Last 3 Encounters:  09/27/21 165 lb (74.8 kg)  09/16/21 167 lb 12.3 oz (76.1 kg)  07/19/21 177 lb (80.3 kg)      Other studies Reviewed: Additional studies/ records that were reviewed today include: Hospital records reviewed.  . Review of the above records demonstrates:  Please see elsewhere in the note.     ASSESSMENT AND PLAN:  CAD - The patient has no new sypmtoms.  No further cardiovascular testing is indicated.  We will continue with aggressive risk reduction and meds as listed.  \  Diabetes mellitus  - A1c was most recently 11.8.  This is being actively managed by her primary provider.   Hypertension - The blood pressure is upper limits but I would like it to be in the 920F to 007H systolic.  She is now off HCTZ and I think it is at this level at home and she will keep a blood pressure diary.  C  Hypercholesterolemia with hypertriglyceridemia - LDL was 140.  I sent her to the  Lipid Clinic. rather than start her on PCSK9 a attempted Crestor.  She has not tolerated statins previously because of some elevated liver enzymes.  We are going to  repeat lipid profile in mid November and make adjustments from there.    Diastolic heart failure - She seems to be euvolemic.  No change in therapy.    Current medicines are reviewed at length with the patient today.  The patient does not have concerns regarding medicines.  The following changes have been made: None  Labs/ tests ordered today include:   Orders Placed This Encounter  Procedures   Lipid panel       Disposition:   FU with 6 months   Signed, Minus Breeding, MD  09/27/2021 1:49 PM    [facility]

## 2021-09-27 ENCOUNTER — Other Ambulatory Visit: Payer: Self-pay | Admitting: *Deleted

## 2021-09-27 ENCOUNTER — Other Ambulatory Visit: Payer: Self-pay

## 2021-09-27 ENCOUNTER — Encounter: Payer: Self-pay | Admitting: Cardiology

## 2021-09-27 ENCOUNTER — Ambulatory Visit (INDEPENDENT_AMBULATORY_CARE_PROVIDER_SITE_OTHER): Payer: BC Managed Care – PPO | Admitting: Cardiology

## 2021-09-27 VITALS — BP 140/80 | HR 76 | Ht 62.0 in | Wt 165.0 lb

## 2021-09-27 DIAGNOSIS — I10 Essential (primary) hypertension: Secondary | ICD-10-CM | POA: Diagnosis not present

## 2021-09-27 DIAGNOSIS — E785 Hyperlipidemia, unspecified: Secondary | ICD-10-CM

## 2021-09-27 DIAGNOSIS — I2510 Atherosclerotic heart disease of native coronary artery without angina pectoris: Secondary | ICD-10-CM

## 2021-09-27 DIAGNOSIS — E119 Type 2 diabetes mellitus without complications: Secondary | ICD-10-CM | POA: Diagnosis not present

## 2021-09-27 DIAGNOSIS — I5032 Chronic diastolic (congestive) heart failure: Secondary | ICD-10-CM

## 2021-09-27 DIAGNOSIS — S0180XD Unspecified open wound of other part of head, subsequent encounter: Secondary | ICD-10-CM | POA: Diagnosis not present

## 2021-09-27 DIAGNOSIS — I110 Hypertensive heart disease with heart failure: Secondary | ICD-10-CM | POA: Diagnosis not present

## 2021-09-27 NOTE — Patient Instructions (Signed)
Medication Instructions:  The current medical regimen is effective;  continue present plan and medications.  *If you need a refill on your cardiac medications before your next appointment, please call your pharmacy*  Lab Work: Please have a Lipid profile.  This can be completed at any [place] location.  If you have labs (blood work) drawn today and your tests are completely normal, you will receive your results only by: [place] (if you have MyChart) OR A paper copy in the mail If you have any lab test that is abnormal or we need to change your treatment, we will call you to review the results.  Follow-Up: At [facility], you and your health needs are our priority.  As part of our continuing mission to provide you with exceptional heart care, we have created designated Provider Care Teams.  These Care Teams include your primary Cardiologist (physician) and Advanced Practice Providers (APPs -  Physician Assistants and Nurse Practitioners) who all work together to provide you with the care you need, when you need it.  We recommend signing up for the patient portal called "MyChart".  Sign up information is provided on this After Visit Summary.  MyChart is used to connect with patients for Virtual Visits (Telemedicine).  Patients are able to view lab/test results, encounter notes, upcoming appointments, etc.  Non-urgent messages can be sent to your provider as well.   To learn more about what you can do with MyChart, go to [email or web address].    Your next appointment:   6 month(s)  The format for your next appointment:   In Person  Provider:   Minus Breeding, MD   Thank you for choosing [facility]!!

## 2021-09-27 NOTE — Patient Outreach (Signed)
[facility]) Care Management  09/27/2021  Amanda Mendez Apr 26, 1956 [id number]   [facility] outreach to post hospital referred patient   Amanda Mendez was referred to [facility] for post hospital, complex care and disease management services on 09/18/21 after she discharged from the hospital on 09/16/21   Insurance blue cross and blue shield, Medicare   Transition of care services noted to be completed by primary care MD office staff -[facility]  Transition of Care will be completed by primary care provider office who will refer to [facility] care management if needed.    [facility] Unsuccessful outreach   Outreach attempt to the home number [phone] No answer. [facility] RN CM left HIPAA [facility] Portability and Accountability Act) compliant voicemail message along with CM's contact info.   Plan: [facility] RN CM scheduled this patient for another call attempt within 4-7 business days Unsuccessful outreach on 09/27/21   Ijeoma Loor L. Lavina Hamman, RN, BSN, [place] Coordinator Office number [phone] Mobile number [phone]  Main [facility] number [phone] Fax number [phone]

## 2021-09-28 DIAGNOSIS — I5032 Chronic diastolic (congestive) heart failure: Secondary | ICD-10-CM | POA: Diagnosis not present

## 2021-09-28 DIAGNOSIS — Z87442 Personal history of urinary calculi: Secondary | ICD-10-CM | POA: Diagnosis not present

## 2021-09-28 DIAGNOSIS — E7800 Pure hypercholesterolemia, unspecified: Secondary | ICD-10-CM | POA: Diagnosis not present

## 2021-09-28 DIAGNOSIS — K219 Gastro-esophageal reflux disease without esophagitis: Secondary | ICD-10-CM | POA: Diagnosis not present

## 2021-09-28 DIAGNOSIS — Z9181 History of falling: Secondary | ICD-10-CM | POA: Diagnosis not present

## 2021-09-28 DIAGNOSIS — Z7984 Long term (current) use of oral hypoglycemic drugs: Secondary | ICD-10-CM | POA: Diagnosis not present

## 2021-09-28 DIAGNOSIS — Z8701 Personal history of pneumonia (recurrent): Secondary | ICD-10-CM | POA: Diagnosis not present

## 2021-09-28 DIAGNOSIS — S0180XD Unspecified open wound of other part of head, subsequent encounter: Secondary | ICD-10-CM | POA: Diagnosis not present

## 2021-09-28 DIAGNOSIS — E119 Type 2 diabetes mellitus without complications: Secondary | ICD-10-CM | POA: Diagnosis not present

## 2021-09-28 DIAGNOSIS — I110 Hypertensive heart disease with heart failure: Secondary | ICD-10-CM | POA: Diagnosis not present

## 2021-09-28 DIAGNOSIS — I2510 Atherosclerotic heart disease of native coronary artery without angina pectoris: Secondary | ICD-10-CM | POA: Diagnosis not present

## 2021-09-28 DIAGNOSIS — Z7982 Long term (current) use of aspirin: Secondary | ICD-10-CM | POA: Diagnosis not present

## 2021-09-30 ENCOUNTER — Other Ambulatory Visit: Payer: Self-pay

## 2021-09-30 ENCOUNTER — Emergency Department (HOSPITAL_COMMUNITY)
Admission: EM | Admit: 2021-09-30 | Discharge: 2021-09-30 | Disposition: A | Discharge status: Home / Self Care | Payer: BC Managed Care – PPO | Attending: Emergency Medicine | Admitting: Emergency Medicine

## 2021-09-30 ENCOUNTER — Emergency Department (HOSPITAL_COMMUNITY): Payer: BC Managed Care – PPO

## 2021-09-30 ENCOUNTER — Encounter (HOSPITAL_COMMUNITY): Payer: Self-pay | Admitting: Emergency Medicine

## 2021-09-30 DIAGNOSIS — Z87891 Personal history of nicotine dependence: Secondary | ICD-10-CM | POA: Diagnosis not present

## 2021-09-30 DIAGNOSIS — I5031 Acute diastolic (congestive) heart failure: Secondary | ICD-10-CM | POA: Insufficient documentation

## 2021-09-30 DIAGNOSIS — E119 Type 2 diabetes mellitus without complications: Secondary | ICD-10-CM | POA: Diagnosis not present

## 2021-09-30 DIAGNOSIS — Z7984 Long term (current) use of oral hypoglycemic drugs: Secondary | ICD-10-CM | POA: Diagnosis not present

## 2021-09-30 DIAGNOSIS — Z7982 Long term (current) use of aspirin: Secondary | ICD-10-CM | POA: Insufficient documentation

## 2021-09-30 DIAGNOSIS — I110 Hypertensive heart disease with heart failure: Secondary | ICD-10-CM | POA: Diagnosis not present

## 2021-09-30 DIAGNOSIS — Z79899 Other long term (current) drug therapy: Secondary | ICD-10-CM | POA: Diagnosis not present

## 2021-09-30 DIAGNOSIS — I2510 Atherosclerotic heart disease of native coronary artery without angina pectoris: Secondary | ICD-10-CM | POA: Insufficient documentation

## 2021-09-30 DIAGNOSIS — M79605 Pain in left leg: Secondary | ICD-10-CM

## 2021-09-30 MED ORDER — TRAMADOL HCL 50 MG PO TABS
50.0000 mg | ORAL_TABLET | Freq: Once | ORAL | Status: AC
  Administered 2021-09-30: 50 mg via ORAL
  Filled 2021-09-30: qty 1

## 2021-09-30 MED ORDER — DULOXETINE HCL 30 MG PO CPEP: 30.0000 mg | ORAL_CAPSULE | Freq: Every day | ORAL | 0 refills | Status: DC

## 2021-09-30 MED ORDER — IBUPROFEN 600 MG PO TABS: 600.0000 mg | ORAL_TABLET | Freq: Three times a day (TID) | ORAL | 0 refills | Status: AC | PRN

## 2021-09-30 NOTE — ED Triage Notes (Signed)
Patient c/o left leg pain x1 month. Per patient admitted at [facility] due to fall related to pain in leg. Patient states still unable to find cause of pain in leg despite the admission with tests but pain it is progressively getting worse. Patient wearing a ortho boot. Per patient now no longer able to bear weight on leg at all.

## 2021-09-30 NOTE — ED Provider Notes (Signed)
[facility] EMERGENCY DEPARTMENT Provider Note   CSN: [id number] Arrival date & time: 09/30/21  1444     History Chief Complaint  Patient presents with   Leg Pain    Amanda Mendez is a 65 y.o. female.  Patient with history of diabetes, CAD presents today with complaint of left leg pain.  Patient states pain has been present for approximately 5 weeks, and came on gradually without injury.  She states that the pain feels like a "shock wave up my entire leg" and is exacerbated by ambulation.  Pain has been significantly worse over the past 2 days with new inability to ambulate without significant pain.  Of note, patient has received extensive work-up for this including vascular study which ruled that she did not have an occlusion in the area, and stated that pain was likely due to neuropathy versus small vessel disease.  Her last A1C was 11. Patient is also being seen by physical therapy for symptom management, and presents today in a walking boot.  She is also in the process of getting orthopedic and neurology follow-ups for continued management.  Pain and tenderness located throughout the anterior leg without posterior calf pain or tenderness.  Patient has full sensation, pulses intact.  Denies any similar symptoms to the right leg or back pain.  Denies fevers, chills, chest pain, shortness of breath, abdominal pain, nausea, vomiting, diarrhea.  The history is provided by the patient. No language interpreter was used.  Leg Pain Associated symptoms: no back pain, no fatigue, no fever and no neck pain       Past Medical History:  Diagnosis Date   Anginal pain ([place])    Coronary artery disease    angioplasty 9/13   Exertional dyspnea    "usually; today it was with nothing" (11/03/2012)   GERD (gastroesophageal reflux disease)    Hypercholesterolemia with hypertriglyceridemia    Hypertension    Kidney stones    "I've had them 5 times; lithotripsy 1st time; flushed out  all the others"  (11/03/2012)   Obesity (BMI 30-39.9)    Pneumonia 2012   Sinus headache    "weekly" (11/03/2012)   Type II diabetes mellitus ([place]) 2007    Patient Active Problem List   Diagnosis Date Noted   Chronic diastolic HF (heart failure) ([place]) 09/26/2021   Fall 09/10/2021   Left leg pain 09/10/2021   Need for 23-polyvalent pneumococcal polysaccharide vaccine 09/25/2016   Need for immunization against influenza 09/25/2016   Gastroesophageal reflux disease without esophagitis 06/25/2016   Dyslipidemia (high LDL; low HDL) 03/26/2016   Vitamin D deficiency 06/16/2015   Obesity (BMI 30-39.9) 03/24/2015   Gastroenteritis, infectious 05/29/2014   Abdominal pain 05/28/2014   HLD (hyperlipidemia) 05/28/2014   Atherosclerotic heart disease of native coronary artery without angina pectoris 09/03/2012   DM type 2 with diabetic dyslipidemia ([place]) 08/14/2012   Essential (primary) hypertension 08/14/2012   Unstable angina ([place]) 29/51/8841   Acute diastolic heart failure ([place]) 08/13/2012    Past Surgical History:  Procedure Laterality Date   ABDOMINAL AORTOGRAM W/LOWER EXTREMITY Left 09/12/2021   Procedure: ABDOMINAL AORTOGRAM W/LOWER EXTREMITY;  Surgeon: Serafina Mitchell, MD;  Location: [facility] [place] [facility];  Service: Cardiovascular;  Laterality: Left;   CARDIAC CATHETERIZATION  11/25/29013   CORONARY ANGIOPLASTY WITH STENT PLACEMENT  08/2012   "1"   HEMATOMA EVACUATION  09/22/2012   Procedure: EVACUATION HEMATOMA;  Surgeon: Wynonia Sours, MD;  Location: [facility];  Service: Orthopedics;  Laterality:  Right;  Evacuation Hematoma right hand   LEFT AND RIGHT HEART CATHETERIZATION WITH CORONARY ANGIOGRAM N/A 08/14/2012   Procedure: LEFT AND RIGHT HEART CATHETERIZATION WITH CORONARY ANGIOGRAM;  Surgeon: Minus Breeding, MD;  Location[facility] C[facility] LAB;  Service: Cardiovascular;  Laterality: N/A;   LEFT HEART CATHETERIZATION WITH CORONARY ANGIOGRAM N/A 11/03/2012   Procedure: LEFT HEART CATHETERIZATION  WITH CORONARY ANGIOGRAM;  Surgeon: Jolaine Artist, MD;  Location: [facility] C[facility] LAB;  Service: Cardiovascular;  Laterality: N/A;   LITHOTRIPSY  1990's   PERCUTANEOUS CORONARY STENT INTERVENTION (PCI-S)  08/14/2012   Procedure: PERCUTANEOUS CORONARY STENT INTERVENTION (PCI-S);  Surgeon: Minus Breeding, MD;  Location: [facility] CATH LAB;  Service: Cardiovascular;;   SKIN BIOPSY  2012   "off my back; it was nothing fatal; infection caused it" (11/03/2012)   TUBAL LIGATION  1980     OB History     Gravida  2   Para  2   Term  2   Preterm      AB      Living  2      SAB      IAB      Ectopic      Multiple      Live Births              Family History  Problem Relation Age of Onset   Heart failure Mother        Died in her 65s, had angina starting in her 36s   Crohn's disease Brother     Social History   Tobacco Use   Smoking status: Former    Packs/day: 1.50    Years: 37.00    Pack years: 55.50    Types: Cigarettes    Quit date: 06/11/2012    Years since quitting: 9.3   Smokeless tobacco: Never  Vaping Use   Vaping Use: Never used  Substance Use Topics   Alcohol use: Never    Comment: 11/03/2012 "aien't drank none in ~ 10 yr; then it was just an occasional daquiri on vacation"   Drug use: Never    Home Medications Prior to Admission medications   Medication Sig Start Date End Date Taking? Authorizing Provider  aspirin EC 81 MG tablet Take 81 mg by mouth daily. Swallow whole.    [provider]  Cholecalciferol (VITAMIN D3) 1.25 MG (50000 UT) TABS Take 5,000 Units by mouth.    [provider]  cloNIDine (CATAPRES) 0.1 MG tablet Take 0.1 mg by mouth 2 (two) times daily.    [provider]  gabapentin (NEURONTIN) 300 MG capsule Take 300 mg by mouth 3 (three) times daily. 09/06/21   [provider]  glipiZIDE (GLUCOTROL XL) 10 MG 24 hr tablet Take 10 mg by mouth 2 (two) times daily at 8 am and 10 pm.    [provider]   ipratropium (ATROVENT) 0.06 % nasal spray 2 sprays into each nostril Three (3) times a day. 08/07/21 08/07/22  [provider]  losartan (COZAAR) 100 MG tablet Take 1 tablet (100 mg total) by mouth daily. 09/17/21 10/17/21  Dana Allan I, MD  metoprolol (LOPRESSOR) 50 MG tablet Take 50 mg by mouth 2 (two) times daily.    [provider]  Multiple Vitamin (MULTIVITAMIN) capsule Take 1 capsule by mouth daily.    [provider]  omeprazole (PRILOSEC) 20 MG capsule Take 20 mg by mouth daily.    [provider]  oxyCODONE (OXY IR/ROXICODONE) 5 MG immediate  release tablet Take 1 tablet (5 mg total) by mouth every 4 (four) hours as needed for moderate pain or severe pain. 09/16/21   Dana Allan I, MD  polyethylene glycol (MIRALAX / GLYCOLAX) 17 g packet Take 17 g by mouth daily as needed for mild constipation. 09/16/21   Dana Allan I, MD  rosuvastatin (CRESTOR) 10 MG tablet Take 1 tablet (10 mg total) by mouth daily. 08/04/21   Minus Breeding, MD  senna-docusate (SENOKOT-S) 8.6-50 MG tablet Take 1 tablet by mouth at bedtime as needed for moderate constipation. 09/16/21 10/16/21  Dana Allan I, MD  sitaGLIPtin (JANUVIA) 100 MG tablet Take 100 mg by mouth daily.    [provider]    Allergies    Dapagliflozin and Codeine  Review of Systems   Review of Systems  Constitutional:  Negative for chills, fatigue and fever.  Respiratory:  Negative for cough and shortness of breath.   Cardiovascular:  Negative for chest pain, palpitations and leg swelling.  Gastrointestinal:  Negative for abdominal distention, abdominal pain, diarrhea, nausea and vomiting.  Musculoskeletal:  Positive for arthralgias, gait problem and myalgias. Negative for back pain, joint swelling, neck pain and neck stiffness.  Skin:  Negative for rash and wound.  Neurological:  Negative for dizziness, tremors, seizures, syncope, facial asymmetry, speech difficulty, weakness,  light-headedness, numbness and headaches.  Psychiatric/Behavioral:  Negative for confusion and decreased concentration.   All other systems reviewed and are negative.  Physical Exam Updated Vital Signs BP (!) 110/98 (BP Location: Left Arm)   Pulse 71   Temp 97.9 F (36.6 C) (Oral)   Resp (!) 22   Ht 5\' 2"  (1.575 m)   Wt 74.4 kg   SpO2 99%   BMI 30.00 kg/m   Physical Exam Vitals and nursing note reviewed.  Constitutional:      General: She is not in acute distress.    Appearance: Normal appearance. She is normal weight. She is not ill-appearing, toxic-appearing or diaphoretic.  HENT:     Head: Normocephalic and atraumatic.  Cardiovascular:     Rate and Rhythm: Normal rate and regular rhythm.     Pulses:          Dorsalis pedis pulses are detected w/ Doppler on the left side.       Posterior tibial pulses are detected w/ Doppler on the left side.     Heart sounds: Normal heart sounds.  Pulmonary:     Effort: Pulmonary effort is normal. No respiratory distress.  Abdominal:     General: Abdomen is flat. Bowel sounds are normal.     Palpations: Abdomen is soft.  Musculoskeletal:        General: Normal range of motion.     Cervical back: Normal and normal range of motion.     Thoracic back: Normal.     Lumbar back: Normal.     Comments: Pain noted to left anterior ankle, exacerbated by dorsiflexion and inversion, tender to the touch.  No warmth, swelling, wound, or deformity noted.  Tenderness to palpation of entire tibia and femur, full passive ROM.  No warmth, swelling, wound, or deformity noted.  No tenderness noted to posterior calf area, negative Homans' sign.  No edema present  Feet:     Left foot:     Skin integrity: Skin integrity normal. No erythema or warmth.  Skin:    General: Skin is warm and dry.  Neurological:     General: No focal deficit present.  Mental Status: She is alert.  Psychiatric:        Mood and Affect: Mood normal.        Behavior:  Behavior normal.    ED Results / Procedures / Treatments   Labs (all labs ordered are listed, but only abnormal results are displayed) Labs Reviewed - No data to display  EKG None  Radiology DG Tibia/Fibula Left  Result Date: 09/30/2021 CLINICAL DATA:  Left leg pain for 1 month. Patient reports inability to bear weight. EXAM: LEFT TIBIA AND FIBULA - 2 VIEW COMPARISON:  Radiographs 05/29/2020. FINDINGS: The mineralization and alignment are normal. There is no evidence of acute fracture or dislocation. The joint spaces appear preserved. No focal soft tissue abnormalities are identified. IMPRESSION: Normal examination. Electronically Signed   By: Richardean Sale M.D.   On: 09/30/2021 16:52   DG Femur Min 2 Views Left  Result Date: 09/30/2021 CLINICAL DATA:  Left leg pain for 1 month. Patient reports inability to bear weight. EXAM: LEFT FEMUR 2 VIEWS COMPARISON:  Hip radiographs 09/10/2021. Femur radiographs 09/09/2021. FINDINGS: The mineralization and alignment are normal. There is no evidence of acute fracture or dislocation. The joint spaces are preserved. Stable spurring of the greater trochanter. No focal soft tissue abnormalities are identified. IMPRESSION: Stable left femur radiographs.  No acute osseous findings. Electronically Signed   By: Richardean Sale M.D.   On: 09/30/2021 16:51    Procedures Procedures   Medications Ordered in ED Medications  traMADol (ULTRAM) tablet 50 mg (has no administration in time range)    ED Course  I have reviewed the triage vital signs and the nursing notes.  Pertinent labs & imaging results that were available during my care of the patient were reviewed by me and considered in my medical decision making (see chart for details).    MDM Rules/Calculators/A&P                         Patient presents with 5 weeks of worsening left leg pain. Peripheral angiogram revealed no vascular cause for this pain. Patient's distal pulses continue to be  intact. Patient has no infectious symptoms, and the affected limb is not erythematous, swollen, or warm. With extended length of time of symptoms, doubt infectious cause at this time. She is afebrile, non-toxic appearing, and in no acute distress.  Imaging of the leg shows no acute abnormalities. Given diabetic history and A1C of 11, feel symptoms are likely related to neuropathy, which is inline with vascular surgery's thoughts as well. Will prescribe Cymbalta for management and ibuprofen as needed to try and get pain better controlled in interim between now and orthopedics and neurology follow-up. Educated patient on these medications and other supportive care measures for symptoms. Patient has at home PT for symptoms as well. She is amenable with plans of discharge and educated on red flag symptoms that would prompt immediate return. Patient discharged in stable condition.  Findings and plan of care discussed with supervising physician Dr. Sabra Heck who is in agreement.    Final Clinical Impression(s) / ED Diagnoses Final diagnoses:  Left leg pain    Rx / DC Orders ED Discharge Orders          Ordered    DULoxetine (CYMBALTA) 30 MG capsule  Daily        09/30/21 1558    ibuprofen (ADVIL) 600 MG tablet  Every 8 hours PRN        09/30/21 1746  An After Visit Summary was printed and given to the patient.    Nestor Lewandowsky 09/30/21 Waunita Schooner, MD 10/01/21 [phone]

## 2021-09-30 NOTE — ED Notes (Signed)
Dc instructions and scripts reviewed with pt. No questions or concerns at this time. Pt wheeled to car and daughter in law drove pt home.

## 2021-09-30 NOTE — Discharge Instructions (Signed)
Pain of your leg today was negative for fracture or other acute abnormality. I have started you on 2 medications that I hope will improve your symptoms. Take Cymbalta with breakfast, and understand that it may take some time to see improvement in your symptoms from this. Rest your leg and continue to wear the boot with ambulation. Continue to pursue appointments with neurology and orthopedics for further management of symptoms.  Return if symptoms worsen

## 2021-09-30 NOTE — ED Notes (Signed)
Patient transported to X-ray 

## 2021-10-05 ENCOUNTER — Other Ambulatory Visit: Payer: Self-pay | Admitting: *Deleted

## 2021-10-05 DIAGNOSIS — M25552 Pain in left hip: Secondary | ICD-10-CM | POA: Diagnosis not present

## 2021-10-05 NOTE — Patient Outreach (Addendum)
[facility]) Care Management  10/05/2021  Amanda Mendez 14-Jun-1956 [id number]   THN second unsuccessful outreach to post hospital referred patient   [name] was referred to [facility] for post hospital, complex care and disease management services on 09/18/21 after she discharged from the hospital on 09/16/21   Insurance blue cross and blue shield, Medicare   Transition of care services noted to be completed by primary care MD office staff -[facility]  Transition of Care will be completed by primary care provider office who will refer to [facility] care management if needed.     THN Unsuccessful outreach     Outreach attempt to the home number [phone] listed at the preferred outreach number in EPIC No answer. THN RN CM left HIPAA [facility] Portability and Accountability Act) compliant voicemail message along with CM's contact info.     Since the 09/27/21 unsuccessful outreach to the patient it is noted in epic that she did have a 09/30/21 ED visit for complaint of left leg pain. ED imaging showed no abnormalities possibly related to neuropathy. Prescribe Cymbalta and ibuprofen for management   Plan: [facility] RN CM scheduled this patient for another call attempt within 4-7 business days Unsuccessful outreach on 09/27/21, 10/05/21 Unsuccessful outreach letter sent on 10/05/21      Joelene Millin L. Lavina Hamman, RN, BSN, [place] Coordinator Office number [phone] Mobile number [phone]  Main THN number [phone] Fax number [phone]

## 2021-10-09 ENCOUNTER — Other Ambulatory Visit (HOSPITAL_COMMUNITY): Payer: Self-pay | Admitting: Family Medicine

## 2021-10-09 DIAGNOSIS — M25552 Pain in left hip: Secondary | ICD-10-CM

## 2021-10-10 DIAGNOSIS — K219 Gastro-esophageal reflux disease without esophagitis: Secondary | ICD-10-CM | POA: Diagnosis not present

## 2021-10-10 DIAGNOSIS — Z87442 Personal history of urinary calculi: Secondary | ICD-10-CM | POA: Diagnosis not present

## 2021-10-10 DIAGNOSIS — Z9181 History of falling: Secondary | ICD-10-CM | POA: Diagnosis not present

## 2021-10-10 DIAGNOSIS — Z8701 Personal history of pneumonia (recurrent): Secondary | ICD-10-CM | POA: Diagnosis not present

## 2021-10-10 DIAGNOSIS — I2510 Atherosclerotic heart disease of native coronary artery without angina pectoris: Secondary | ICD-10-CM | POA: Diagnosis not present

## 2021-10-10 DIAGNOSIS — Z7982 Long term (current) use of aspirin: Secondary | ICD-10-CM | POA: Diagnosis not present

## 2021-10-10 DIAGNOSIS — E7800 Pure hypercholesterolemia, unspecified: Secondary | ICD-10-CM | POA: Diagnosis not present

## 2021-10-10 DIAGNOSIS — I5032 Chronic diastolic (congestive) heart failure: Secondary | ICD-10-CM | POA: Diagnosis not present

## 2021-10-10 DIAGNOSIS — S0180XD Unspecified open wound of other part of head, subsequent encounter: Secondary | ICD-10-CM | POA: Diagnosis not present

## 2021-10-10 DIAGNOSIS — I110 Hypertensive heart disease with heart failure: Secondary | ICD-10-CM | POA: Diagnosis not present

## 2021-10-10 DIAGNOSIS — Z7984 Long term (current) use of oral hypoglycemic drugs: Secondary | ICD-10-CM | POA: Diagnosis not present

## 2021-10-10 DIAGNOSIS — E119 Type 2 diabetes mellitus without complications: Secondary | ICD-10-CM | POA: Diagnosis not present

## 2021-10-12 ENCOUNTER — Other Ambulatory Visit: Payer: Self-pay | Admitting: *Deleted

## 2021-10-12 NOTE — Patient Outreach (Addendum)
[facility]) Care Management  10/12/2021  [name] [birth year] [id number]   THN third unsuccessful outreach to post hospital referred patient   [name] was referred to [facility] for post hospital, complex care and disease management services on 09/18/21 after she discharged from the hospital on 09/16/21   Insurance blue cross and blue shield, Medicare   Transition of care services noted to be completed by primary care MD office staff -[facility]  Transition of Care will be completed by primary care provider office who will refer to [facility] care management if needed.    A brief message had been received from [place] to update RN CM that the patient was doing fair but pending referrals to various providers  [facility] Unsuccessful outreach    Outreach attempt to the home number [phone] listed at the preferred outreach number in EPIC -This number's message states it is [name], relative phone number  No answer. THN RN CM left HIPAA [facility] Portability and Accountability Act) compliant voicemail message along with CM's contact info.   Offer to assist with referrals to providers (ortho, neurology, endocrinology) as needed      Since the 10/05/21 unsuccessful outreach to the patient it is noted in epic to have seen Dr Nuala Alpha, [place] on 10/05/21 for pain in left hip She was ordered to be non weight bearing until results of a MRI to rule out a stress fracture at the femoral neck, noted scheduled for 10/25/21     Plan: [facility] RN CM scheduled this patient for pending case closure Unsuccessful outreach on 09/27/21, 10/05/21, 10/12/21 Unsuccessful outreach letter sent on 10/05/21      Joelene Millin L. Lavina Hamman, RN, BSN, [place] Coordinator Office number [phone] Mobile number [phone]  Main THN number [phone] Fax number [phone]

## 2021-10-15 DIAGNOSIS — M6281 Muscle weakness (generalized): Secondary | ICD-10-CM | POA: Diagnosis not present

## 2021-10-16 ENCOUNTER — Other Ambulatory Visit: Payer: Self-pay | Admitting: *Deleted

## 2021-10-16 NOTE — Patient Outreach (Signed)
[facility]) Care Management  10/16/2021  Arlyn Sarchet [birth year] [id number]   [facility] outreach from post hospital referred patient   [name] was referred to [facility] for post hospital, complex care and disease management services on 09/18/21 after she discharged from the hospital on 09/16/21   Insurance blue cross and blue shield, Medicare   Transition of care services noted to be completed by primary care MD office staff -[facility]  Transition of Care will be completed by primary care provider office who will refer to [facility] care management if needed.  Amanda Mendez, daughter of [name] outreach to RN CM  She updated RN CM on [name] progress  She has an orthopedic provider who is continuing to request non weight bearing until MRI, therefore PT stopped to prevent risks of injury Amanda Mendez reports pt with lack of motivation to complete  Activities of daily living (ADLs) Without lots of encouragement with decrease family support as Amanda Mendez has medical concerns, her son is working and in school, her grand daughter is 63 years old and Carrie's father with medical and memory concerns has recently arrived in the home [place] inquired about facility placement RN CM reviewed the community processes for facility placement to include assist from pcp for face to face, orders, FL2, facility bed search and facilities Provided names and numbers of local [place] facilities. [facility] reports they do not prefer [place] facilities Discussed home health RN & SW to assist with the process at this time She voiced understanding  Pt last seen by pcp per Amanda Mendez on 09/22/21   Plan Patient agrees to care plan and follow up within the next 30 business days    Shenicka Sunderlin L. Lavina Hamman, RN, BSN, [place] Coordinator Office number [phone] Main [facility] number [phone] Fax number [phone]

## 2021-10-19 ENCOUNTER — Ambulatory Visit: Payer: Self-pay | Admitting: *Deleted

## 2021-10-24 ENCOUNTER — Other Ambulatory Visit: Payer: Self-pay | Admitting: Internal Medicine

## 2021-10-25 ENCOUNTER — Ambulatory Visit (HOSPITAL_COMMUNITY)
Admission: RE | Admit: 2021-10-25 | Discharge: 2021-10-25 | Disposition: A | Discharge status: Home / Self Care | Payer: BC Managed Care – PPO | Source: Ambulatory Visit | Attending: Family Medicine | Admitting: Family Medicine

## 2021-10-25 ENCOUNTER — Other Ambulatory Visit: Payer: Self-pay

## 2021-10-25 DIAGNOSIS — M25552 Pain in left hip: Secondary | ICD-10-CM | POA: Insufficient documentation

## 2021-10-25 DIAGNOSIS — S76312A Strain of muscle, fascia and tendon of the posterior muscle group at thigh level, left thigh, initial encounter: Secondary | ICD-10-CM | POA: Diagnosis not present

## 2021-10-25 IMAGING — MR MR HIP*L* W/O CM
6 series · 40 of 40 positions shown · non-contrast
Comparison: None.

CLINICAL DATA: Left hip pain for 1 month.

EXAM:
MR OF THE LEFT HIP WITHOUT CONTRAST
TECHNIQUE: Multiplanar, multisequence MR imaging was performed. No intravenous
contrast was administered.

[Series 7: T1 · coronal · left · 4.0mm · 0.80mm/px · 7 of 32 slices shown]
[im 1/32]
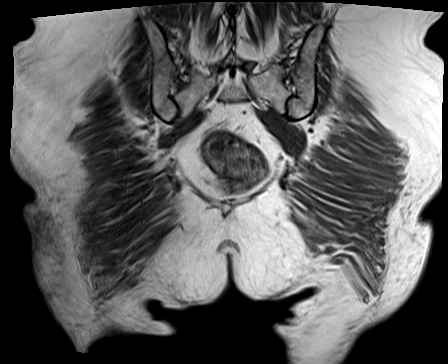
[im 6/32]
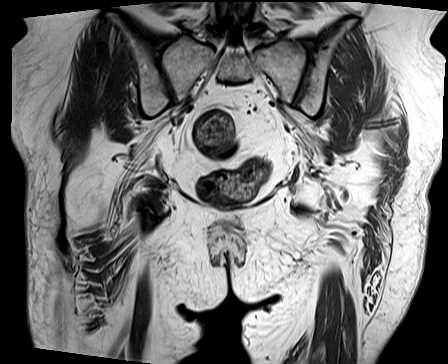
[im 11/32]
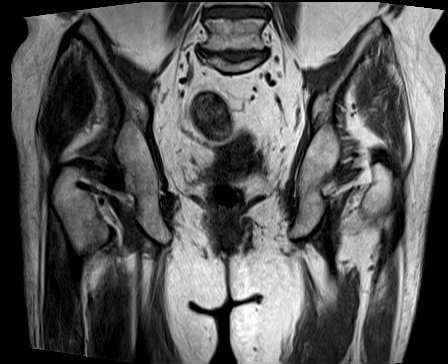
[im 16/32]
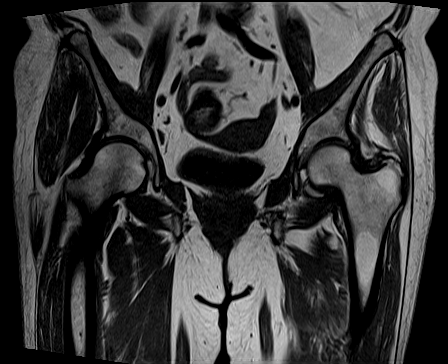
[im 21/32]
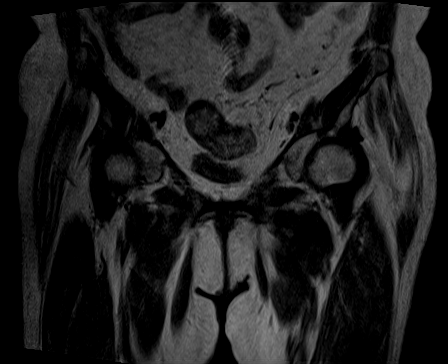
[im 26/32]
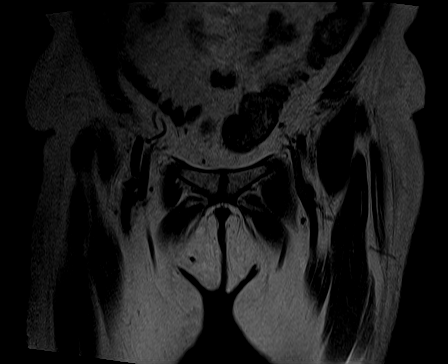
[im 32/32]
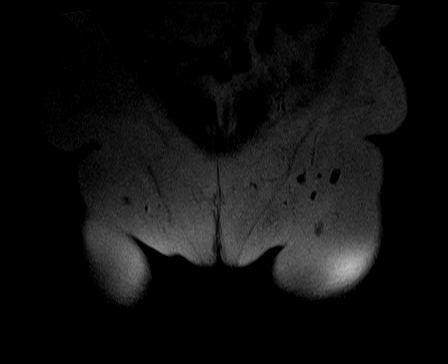

[Series 8: STIR · coronal · left · 4.0mm · 0.94mm/px · 7 of 32 slices shown]
[im 1/32]
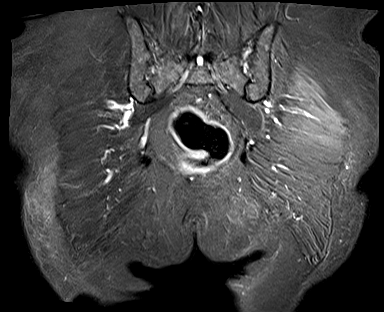
[im 6/32]
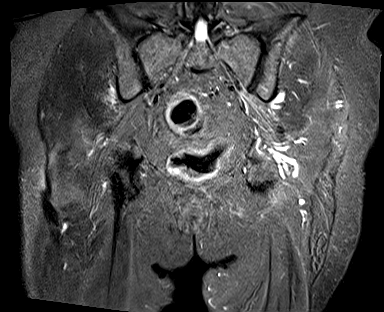
[im 11/32]
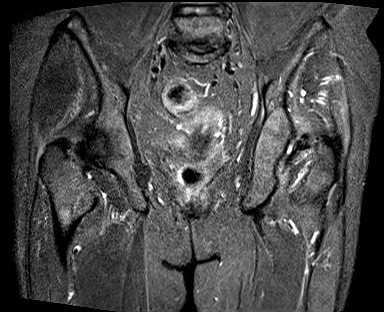
[im 16/32]
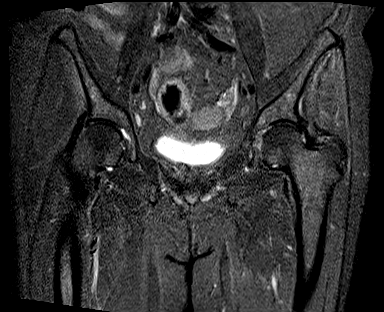
[im 21/32]
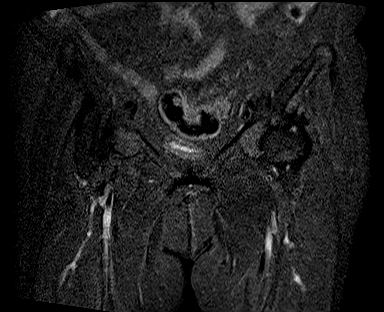
[im 26/32]
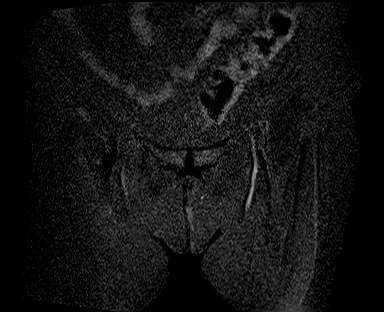
[im 32/32]
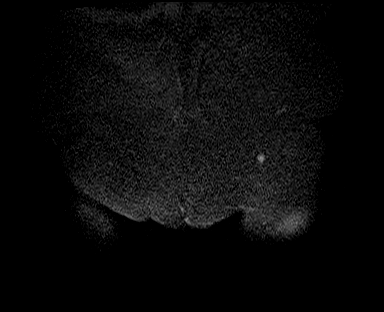

[Series 9: T2 fat-sat · axial · left · 4.0mm · 1.05mm/px · z∈[-77,+68]mm · 7 of 30 slices shown]
[im 1/30]
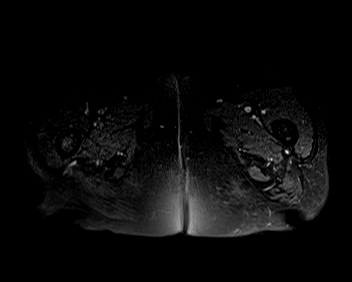
[im 5/30]
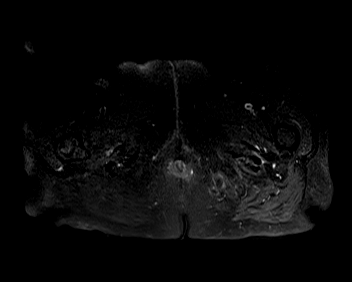
[im 10/30]
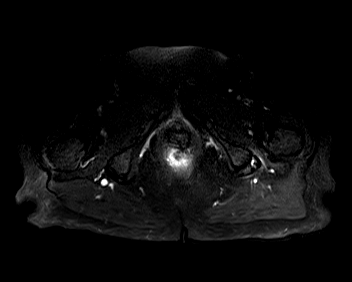
[im 15/30]
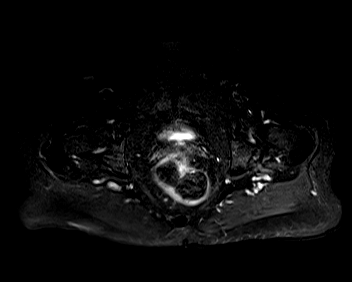
[im 20/30]
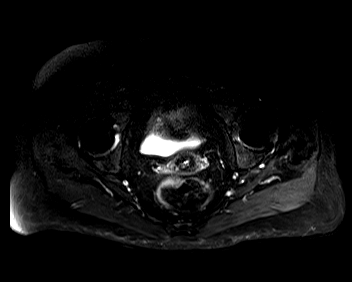
[im 25/30]
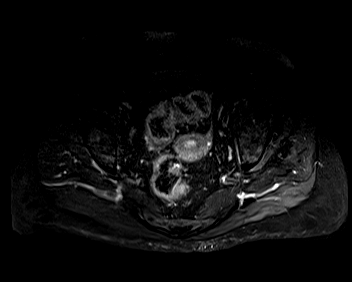
[im 30/30]
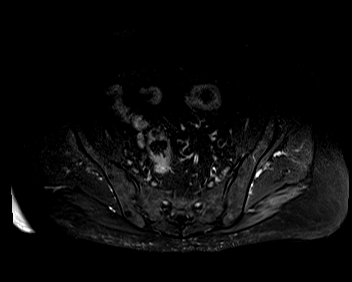

[Series 10: PD fat-sat · sagittal · left · 4.0mm · 0.78mm/px · 6 of 25 slices shown (1 of 3)]
[im 1/25]
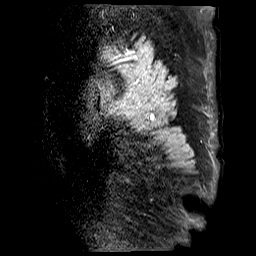
[im 5/25]
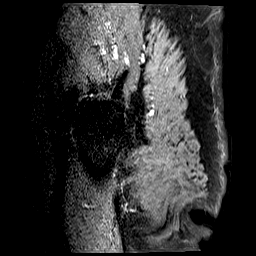
[im 10/25]
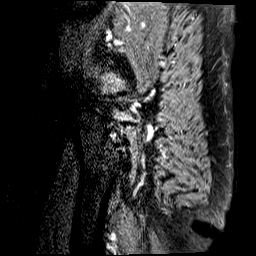
[im 15/25]
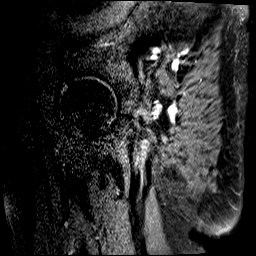
[im 20/25]
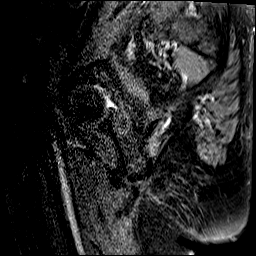
[im 25/25]
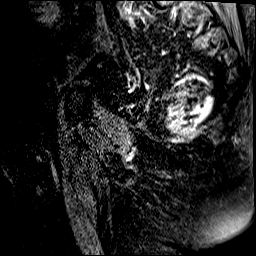

[Series 11: PD fat-sat · coronal · left · 4.0mm · 0.78mm/px · 5 of 20 slices shown (2 of 3)]
[im 1/20]
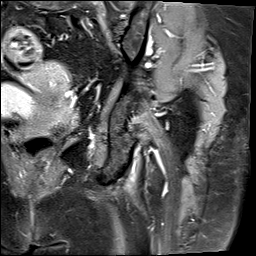
[im 5/20]
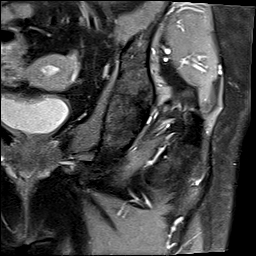
[im 10/20]
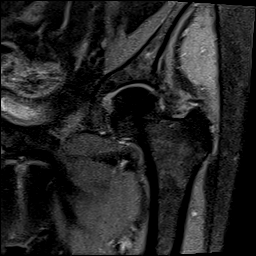
[im 15/20]
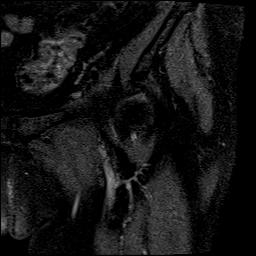
[im 20/20]
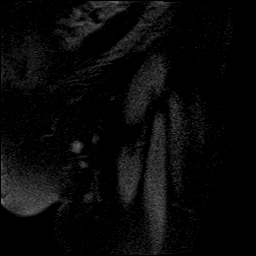

[Series 12: PD fat-sat · axial · left · 4.0mm · 0.69mm/px · z∈[-94,+75]mm · 8 of 35 slices shown (3 of 3)]
[im 1/35]
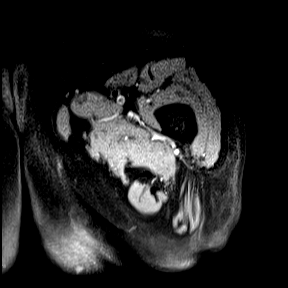
[im 5/35]
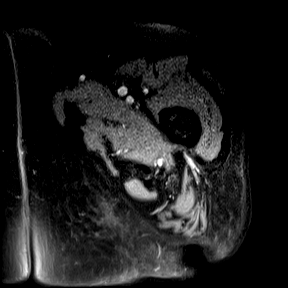
[im 10/35]
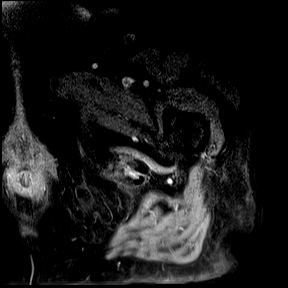
[im 15/35]
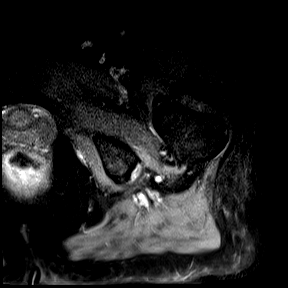
[im 20/35]
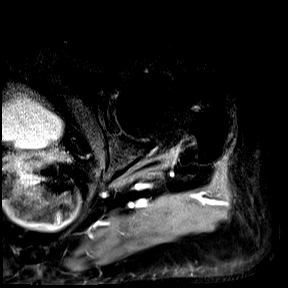
[im 25/35]
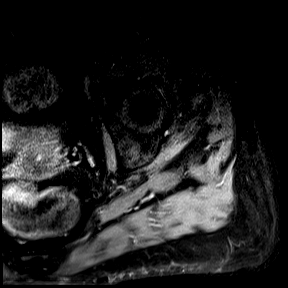
[im 30/35]
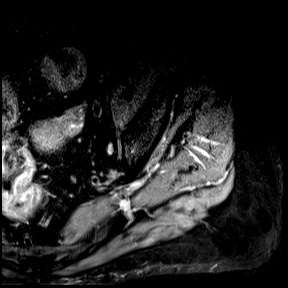
[im 35/35]
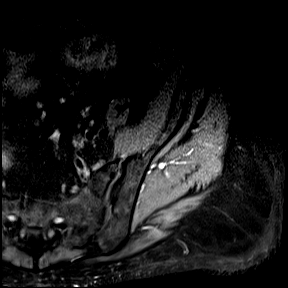

[40 of 40 positions shown; findings below may reference images not displayed]

FINDINGS: Bones:

No hip fracture, dislocation or avascular necrosis.

No periosteal reaction or bone destruction. No aggressive osseous
lesion.

Normal sacrum and sacroiliac joints. No SI joint widening or erosive
changes.

Degenerative disease with disc height loss at L5-S1.

Articular cartilage and labrum

Articular cartilage: Mild partial-thickness cartilage loss of the
left femoral head and acetabulum.

Labrum: Left labral degeneration with a posterosuperior labral tear
and a 12 mm posterior paralabral cyst.

Joint or bursal effusion

Joint effusion:  No hip joint effusion.  No SI joint effusion.

Bursae:  No bursa formation.

Muscles and tendons

Flexors: Normal.

Extensors: Normal.

Abductors: Normal.

Adductors: Normal.

Gluteals: Mild tendinosis of the right gluteus minimus tendon
insertion.

Hamstrings: Mild tendinosis of the left hamstring origin with a
partial-thickness tear.

Other findings

No pelvic free fluid. No fluid collection or hematoma. No inguinal
lymphadenopathy. No inguinal hernia.
IMPRESSION: 1. Mild partial-thickness cartilage loss of the left femoral head
and acetabulum.
2. Left labral degeneration with a posterosuperior labral tear and a
12 mm posterior paralabral cyst.
3. Mild tendinosis of the left hamstring origin with a
partial-thickness tear.

## 2021-10-26 ENCOUNTER — Other Ambulatory Visit: Payer: Self-pay | Admitting: *Deleted

## 2021-10-26 NOTE — Patient Outreach (Addendum)
[facility]) Care Management  10/26/2021  Amanda Mendez Mar 11, 1956 [id number]   [facility] Unsuccessful outreach  Amanda Mendez was referred to [facility] for post hospital, complex care and disease management services on 09/18/21 after she discharged from the hospital on 09/16/21   Insurance blue cross and blue shield, Medicare   Last outreach on 10/16/21 It is noted in EPIC pt had completed MRI of left him completed on 10/25/21  Outreach attempt to the listed at the preferred outreach number in EPIC  No answer. THN RN CM left HIPAA [facility] Portability and Accountability Act) compliant voicemail message along with CM's contact info.  Encouraged a return call and left in message information for Amanda Mendez of pending new RN from Dr [employer] office may call patient in the near future  Plan: [facility] RN CM will attempt another outreach to patient within 4-7 business days  Unsuccessful outreach on 10/26/21   Melony Tenpas L. Lavina Hamman, RN, BSN, [place] Coordinator Office number [phone] Mobile number [phone]  Main THN number [phone] Fax number [phone]

## 2021-10-27 DIAGNOSIS — M25552 Pain in left hip: Secondary | ICD-10-CM | POA: Diagnosis not present

## 2021-10-27 NOTE — Telephone Encounter (Signed)
I called and spoke with patient. Advised the LFT looked good in hospital. We would like to check again.She will go to lab at [facility] in the next few weeks to have fasting lipids and LFT done. Orders placed, but also mailed.

## 2021-11-01 ENCOUNTER — Other Ambulatory Visit: Payer: Self-pay | Admitting: *Deleted

## 2021-11-01 NOTE — Patient Outreach (Signed)
[name]) Care Management Telephonic RN Care Manager Note   11/01/2021 Name:  Amanda Mendez MRN:  [id number] DOB:  [birth year]  Summary: Unsuccessful outreach, left message for Amanda Mendez, Reviewed warm transfer to pcp RN care management services, offered RN CM outreach number for further questions, case closure  Subjective: Amanda Mendez is an 65 y.o. year old female who is a primary patient of Redmond School, MD. The care management team was consulted for assistance with care management and/or care coordination needs.      Objective:  Medications Reviewed Today     Reviewed by Barbaraann Faster, RN (Registered Nurse) on 10/16/21 at 1456  Med List Status: <None>   Medication Order Taking? Sig Documenting Provider Last Dose Status Informant  aspirin EC 81 MG tablet [id number] No Take 81 mg by mouth daily. Swallow whole. [provider] Taking Active Family Member  Cholecalciferol (VITAMIN D3) 1.25 MG (50000 UT) TABS [id number] No Take 5,000 Units by mouth. [provider] Taking Active Family Member  cloNIDine (CATAPRES) 0.1 MG tablet [id number] No Take 0.1 mg by mouth 2 (two) times daily. [provider] Taking Active Family Member  DULoxetine (CYMBALTA) 30 MG capsule [id number]  Take 1 capsule (30 mg total) by mouth daily. Smoot, Leary Roca, PA-C  Active   gabapentin (NEURONTIN) 300 MG capsule [id number] No Take 300 mg by mouth 3 (three) times daily. [provider] Taking Active Family Member  glipiZIDE (GLUCOTROL XL) 10 MG 24 hr tablet [id number] No Take 10 mg by mouth 2 (two) times daily at 8 am and 10 pm. [provider] Taking Active Family Member  ipratropium (ATROVENT) 0.06 % nasal spray [id number] No 2 sprays into each nostril Three (3) times a day. [provider] Taking Active Family Member  losartan (COZAAR) 100 MG tablet [id number] No Take 1 tablet (100 mg total) by mouth daily. Bonnell Public, MD Taking  Active   metoprolol (LOPRESSOR) 50 MG tablet [id number] No Take 50 mg by mouth 2 (two) times daily. [provider] Taking Active Family Member  Multiple Vitamin (MULTIVITAMIN) capsule [id number] No Take 1 capsule by mouth daily. [provider] Taking Active Family Member  omeprazole (PRILOSEC) 20 MG capsule [id number] No Take 20 mg by mouth daily. [provider] Taking Active Family Member  oxyCODONE (OXY IR/ROXICODONE) 5 MG immediate release tablet [id number] No Take 1 tablet (5 mg total) by mouth every 4 (four) hours as needed for moderate pain or severe pain. Bonnell Public, MD Taking Active   polyethylene glycol (MIRALAX / GLYCOLAX) 17 g packet [id number] No Take 17 g by mouth daily as needed for mild constipation. Bonnell Public, MD Taking Active   rosuvastatin (CRESTOR) 10 MG tablet [id number] No Take 1 tablet (10 mg total) by mouth daily. Minus Breeding, MD Taking Active Family Member  senna-docusate (SENOKOT-S) 8.6-50 MG tablet [id number] No Take 1 tablet by mouth at bedtime as needed for moderate constipation. Bonnell Public, MD Taking Active   sitaGLIPtin (JANUVIA) 100 MG tablet [id number] No Take 100 mg by mouth daily. [provider] Taking Active Family Member             SDOH:  (Social Determinants of Health) assessments and interventions performed:    Care Plan  Review of patient past medical history, allergies, medications, health status, including review of consultants reports, laboratory and other test data, was performed as part of comprehensive evaluation for care management services.   There are no care  plans that you recently modified to display for this patient.   Plan case closure  Fedor Kazmierski L. Lavina Hamman, RN, BSN, [place] Coordinator Office number [phone] [facility] number [phone] Fax number [phone]

## 2021-11-13 NOTE — Discharge Summary (Signed)
Physician Discharge Summary  Amanda Mendez SWF:[id number] DOB: 1956/07/03 DOA: 09/10/2021  PCP: Redmond School, MD  Admit date: 09/10/2021 Discharge date: 09/16/2021  Time spent: 35 minutes  Discharge Diagnoses:  Principal Problem:   Fall Active Problems:   DM type 2 with diabetic dyslipidemia ([place])   Essential (primary) hypertension   Atherosclerotic heart disease of native coronary artery without angina pectoris   Left leg pain   Discharge Condition: Stable  Diet recommendation: Diabetic/Cardiac  Filed Weights   09/14/21 0422 09/15/21 0523 09/16/21 0410  Weight: (P) 76.2 kg 75.7 kg 76.1 kg    Brief History:  This 65 year old female with history of DM2, CAD, HTN, diastolic CHF came to the ED after sustaining a fall where she claims her legs gave out.  She reported she fell early in the morning on the day of admission but her family woke up later in the day when they checked up on her and was found to be on the floor.  In the ED she had mild leukocytosis, trauma work-up was overall negative but had diminished blood flow to the lower extremity right greater than left.  Vascular surgery was consulted and patient was transferred to [facility] for further evaluation. She underwent aortogram on 10/4 without evidence of major blockage and did not require intervention.  She continued to have left lower extremity pain therefore MRI lumbar spine ordered along with IV steroids.  Hospital Course:  Severe left leg pain sec. to PAD with concerns for critical limb ischemia: She was seen by vascular surgery and underwent arteriogram and left lower extremity angiogram without any evidence of significant blockage. Her pain appears to be more claudication versus sciatica. She also reports of some lower back pain therefore MRI lumbar spine was obtained. Which showed nerve impingement and mild disc bulge.  Discussed with Dr. Saintclair Halsted from neurosurgery who recommended continuing the steroids for 1  to 2 days and watch her symptoms. Continue  Decadron 4 mg every 12 hours. If it doesn't help and continues to have trouble bearing weight, She may benefit from ortho eval. She continues to have significant left ankle pain, not able to bear weight on foot, Ortho consulted  Orthopaedics states her pain does not seem like Orthopaedic  in nature recommended cam boot and weightbearing as tolerated. PT/OT-recommended home health. She may eventually benefit from outpatient referral to pain management.   CAD s/p PCI in 2013. Continue aspirin and statin. Plavix was apparently stopped 2 months ago.     Type 2 diabetes, uncontrolled: Hold p.o. home meds. Continue sliding scale, Lantus 12 units daily Diabetic coordinator consult   Essential hypertension Continue metoprolol and losartan.  CKD stage IIIa Serum creatinine at baseline.    Procedures: Aortogram by Vascular Surgery on 09/12/2021.    Consultations: Vascular surgery  Discharge Exam: Vitals:   09/16/21 0937 09/16/21 1115  BP: 123/66 (!) 121/58  Pulse: 74 60  Resp: 16 19  Temp:  98 F (36.7 C)  SpO2: 96% 98%    Discharge Instructions   Discharge Instructions     Ambulatory referral to Nutrition and Diabetic Education   Complete by: As directed    Diet - low sodium heart healthy   Complete by: As directed    Discharge wound care:   Complete by: As directed    Continue current wound care.   Increase activity slowly   Complete by: As directed       Allergies as of 09/16/2021  Reactions   Dapagliflozin Itching   Codeine Nausea And Vomiting   But has tolerated morphine        Medication List     STOP taking these medications    azithromycin 250 MG tablet Commonly known as: ZITHROMAX   cephALEXin 500 MG capsule Commonly known as: KEFLEX   clopidogrel 75 MG tablet Commonly known as: PLAVIX   cyclobenzaprine 10 MG tablet Commonly known as: FLEXERIL   ergocalciferol 1.25 MG (50000 UT)  capsule Commonly known as: VITAMIN D2   FIBER 7 PO   losartan-hydrochlorothiazide 100-25 MG tablet Commonly known as: HYZAAR   meloxicam 7.5 MG tablet Commonly known as: MOBIC   naproxen 500 MG tablet Commonly known as: NAPROSYN   ondansetron 4 MG disintegrating tablet Commonly known as: ZOFRAN-ODT   oxybutynin 5 MG tablet Commonly known as: DITROPAN   traMADol 50 MG tablet Commonly known as: ULTRAM       TAKE these medications    aspirin EC 81 MG tablet Take 81 mg by mouth daily. Swallow whole.   cloNIDine 0.1 MG tablet Commonly known as: CATAPRES Take 0.1 mg by mouth 2 (two) times daily.   gabapentin 300 MG capsule Commonly known as: NEURONTIN Take 300 mg by mouth 3 (three) times daily.   glipiZIDE 10 MG 24 hr tablet Commonly known as: GLUCOTROL XL Take 10 mg by mouth 2 (two) times daily at 8 am and 10 pm.   ipratropium 0.06 % nasal spray Commonly known as: ATROVENT 2 sprays into each nostril Three (3) times a day.   losartan 100 MG tablet Commonly known as: COZAAR Take 1 tablet (100 mg total) by mouth daily.   metoprolol tartrate 50 MG tablet Commonly known as: LOPRESSOR Take 50 mg by mouth 2 (two) times daily.   multivitamin capsule Take 1 capsule by mouth daily.   omeprazole 20 MG capsule Commonly known as: PRILOSEC Take 20 mg by mouth daily.   oxyCODONE 5 MG immediate release tablet Commonly known as: Oxy IR/ROXICODONE Take 1 tablet (5 mg total) by mouth every 4 (four) hours as needed for moderate pain or severe pain.   polyethylene glycol 17 g packet Commonly known as: MIRALAX / GLYCOLAX Take 17 g by mouth daily as needed for mild constipation.   rosuvastatin 10 MG tablet Commonly known as: CRESTOR Take 1 tablet (10 mg total) by mouth daily.   sitaGLIPtin 100 MG tablet Commonly known as: JANUVIA Take 100 mg by mouth daily.   Vitamin D3 1.25 MG (50000 UT) Tabs Take 5,000 Units by mouth.       ASK your doctor about these  medications    lidocaine 5 % Commonly known as: [place] onto the skin. Ask about: Should I take this medication?   senna-docusate 8.6-50 MG tablet Commonly known as: Senokot-S Take 1 tablet by mouth at bedtime as needed for moderate constipation. Ask about: Should I take this medication?               Discharge Care Instructions  (From admission, onward)           Start     Ordered   09/16/21 0000  Discharge wound care:       Comments: Continue current wound care.   09/16/21 1429           Allergies  Allergen Reactions   Dapagliflozin Itching   Codeine Nausea And Vomiting    But has tolerated morphine    Follow-up Information  Care, [facility] Follow up.   Specialty: Home Health Services Why: HHPT,HHOT, they will call you to schedule apt times Contact information: [address] [place] [address] [phone]         [facility] Follow up.   Why: rolling walker, 3 n 1, light weight wheel chair Contact information: [place]        Redmond School, MD. Schedule an appointment as soon as possible for a visit.   Specialty: Internal Medicine Why: Please make a hospital follow up appointment in the next 7-10 days after you are discharged from the hospital. Contact information: [address] [place] [address] [phone]                  The results of significant diagnostics from this hospitalization (including imaging, microbiology, ancillary and laboratory) are listed below for reference.    Significant Diagnostic Studies: MR HIP LEFT WO CONTRAST  Result Date: 10/26/2021 CLINICAL DATA:  Left hip pain for 1 month. EXAM: MR OF THE LEFT HIP WITHOUT CONTRAST TECHNIQUE: Multiplanar, multisequence MR imaging was performed. No intravenous contrast was administered. COMPARISON:  None. FINDINGS: Bones: No hip fracture, dislocation or avascular necrosis. No periosteal reaction or bone  destruction. No aggressive osseous lesion. Normal sacrum and sacroiliac joints. No SI joint widening or erosive changes. Degenerative disease with disc height loss at L5-S1. Articular cartilage and labrum Articular cartilage: Mild partial-thickness cartilage loss of the left femoral head and acetabulum. Labrum: Left labral degeneration with a posterosuperior labral tear and a 12 mm posterior paralabral cyst. Joint or bursal effusion Joint effusion:  No hip joint effusion.  No SI joint effusion. Bursae:  No bursa formation. Muscles and tendons Flexors: Normal. Extensors: Normal. Abductors: Normal. Adductors: Normal. Gluteals: Mild tendinosis of the right gluteus minimus tendon insertion. Hamstrings: Mild tendinosis of the left hamstring origin with a partial-thickness tear. Other findings No pelvic free fluid. No fluid collection or hematoma. No inguinal lymphadenopathy. No inguinal hernia. IMPRESSION: 1. Mild partial-thickness cartilage loss of the left femoral head and acetabulum. 2. Left labral degeneration with a posterosuperior labral tear and a 12 mm posterior paralabral cyst. 3. Mild tendinosis of the left hamstring origin with a partial-thickness tear. Electronically Signed   By: Kathreen Devoid M.D.   On: 10/26/2021 07:09    Microbiology: No results found for this or any previous visit (from the past 240 hour(s)).   Labs: Basic Metabolic Panel: No results for input(s): NA, K, CL, CO2, GLUCOSE, BUN, CREATININE, CALCIUM, MG, PHOS in the last 168 hours. Liver Function Tests: No results for input(s): AST, ALT, ALKPHOS, BILITOT, PROT, ALBUMIN in the last 168 hours. No results for input(s): LIPASE, AMYLASE in the last 168 hours. No results for input(s): AMMONIA in the last 168 hours. CBC: No results for input(s): WBC, NEUTROABS, HGB, HCT, MCV, PLT in the last 168 hours. Cardiac Enzymes: No results for input(s): CKTOTAL, CKMB, CKMBINDEX, TROPONINI in the last 168 hours. BNP: BNP (last 3 results) No  results for input(s): BNP in the last 8760 hours.  ProBNP (last 3 results) No results for input(s): PROBNP in the last 8760 hours.  CBG: No results for input(s): GLUCAP in the last 168 hours.     Signed:  Dana Allan, MD  [facility] Pager #: [phone] 7PM-7AM contact night coverage as above

## 2021-11-14 DIAGNOSIS — M6281 Muscle weakness (generalized): Secondary | ICD-10-CM | POA: Diagnosis not present

## 2021-11-27 DIAGNOSIS — E1144 Type 2 diabetes mellitus with diabetic amyotrophy: Secondary | ICD-10-CM | POA: Diagnosis not present

## 2021-12-13 DIAGNOSIS — Z0001 Encounter for general adult medical examination with abnormal findings: Secondary | ICD-10-CM | POA: Diagnosis not present

## 2021-12-13 DIAGNOSIS — E669 Obesity, unspecified: Secondary | ICD-10-CM | POA: Diagnosis not present

## 2021-12-13 DIAGNOSIS — G629 Polyneuropathy, unspecified: Secondary | ICD-10-CM | POA: Diagnosis not present

## 2021-12-13 DIAGNOSIS — G720 Drug-induced myopathy: Secondary | ICD-10-CM | POA: Diagnosis not present

## 2021-12-13 DIAGNOSIS — E119 Type 2 diabetes mellitus without complications: Secondary | ICD-10-CM | POA: Diagnosis not present

## 2021-12-13 DIAGNOSIS — I10 Essential (primary) hypertension: Secondary | ICD-10-CM | POA: Diagnosis not present

## 2021-12-13 DIAGNOSIS — E559 Vitamin D deficiency, unspecified: Secondary | ICD-10-CM | POA: Diagnosis not present

## 2021-12-13 DIAGNOSIS — Z1331 Encounter for screening for depression: Secondary | ICD-10-CM | POA: Diagnosis not present

## 2021-12-13 DIAGNOSIS — I110 Hypertensive heart disease with heart failure: Secondary | ICD-10-CM | POA: Diagnosis not present

## 2021-12-13 DIAGNOSIS — M5432 Sciatica, left side: Secondary | ICD-10-CM | POA: Diagnosis not present

## 2021-12-13 DIAGNOSIS — Z6829 Body mass index (BMI) 29.0-29.9, adult: Secondary | ICD-10-CM | POA: Diagnosis not present

## 2021-12-13 DIAGNOSIS — E538 Deficiency of other specified B group vitamins: Secondary | ICD-10-CM | POA: Diagnosis not present

## 2021-12-15 DIAGNOSIS — M6281 Muscle weakness (generalized): Secondary | ICD-10-CM | POA: Diagnosis not present

## 2021-12-19 ENCOUNTER — Other Ambulatory Visit: Payer: Self-pay | Admitting: Internal Medicine

## 2021-12-19 ENCOUNTER — Other Ambulatory Visit (HOSPITAL_COMMUNITY): Payer: Self-pay | Admitting: Internal Medicine

## 2021-12-19 DIAGNOSIS — R748 Abnormal levels of other serum enzymes: Secondary | ICD-10-CM

## 2022-01-11 ENCOUNTER — Other Ambulatory Visit: Payer: Self-pay

## 2022-01-11 ENCOUNTER — Ambulatory Visit (HOSPITAL_COMMUNITY)
Admission: RE | Admit: 2022-01-11 | Discharge: 2022-01-11 | Disposition: A | Discharge status: Home / Self Care | Payer: BC Managed Care – PPO | Source: Ambulatory Visit | Attending: Internal Medicine | Admitting: Internal Medicine

## 2022-01-11 DIAGNOSIS — R748 Abnormal levels of other serum enzymes: Secondary | ICD-10-CM | POA: Insufficient documentation

## 2022-01-11 DIAGNOSIS — K7689 Other specified diseases of liver: Secondary | ICD-10-CM | POA: Diagnosis not present

## 2022-01-11 DIAGNOSIS — N200 Calculus of kidney: Secondary | ICD-10-CM | POA: Diagnosis not present

## 2022-01-11 DIAGNOSIS — R7989 Other specified abnormal findings of blood chemistry: Secondary | ICD-10-CM | POA: Diagnosis not present

## 2022-01-11 IMAGING — US US ABDOMEN COMPLETE
1 series · 13 of 25 positions shown · non-contrast
Comparison: CT AP, [DATE] and [DATE].

CLINICAL DATA: Abnormal LFTs.

EXAM:
ABDOMEN ULTRASOUND COMPLETE

[Series 1: us abdomen complete · 13 of 91 slices shown]
[im 1/91]
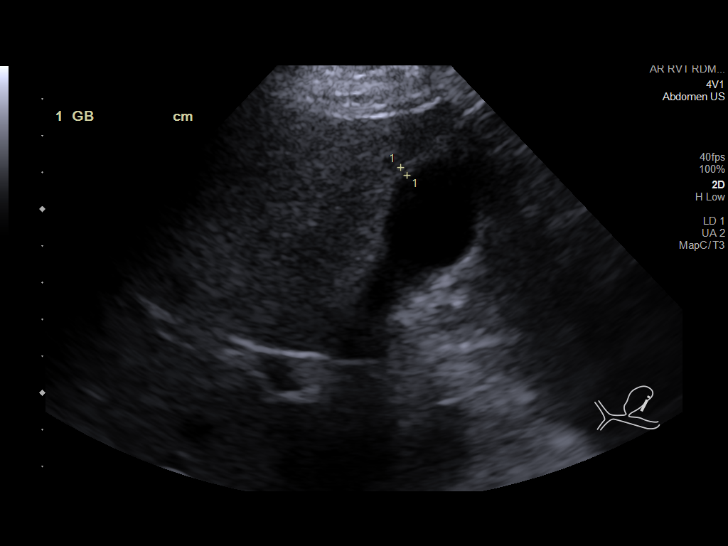
[im 8/91]
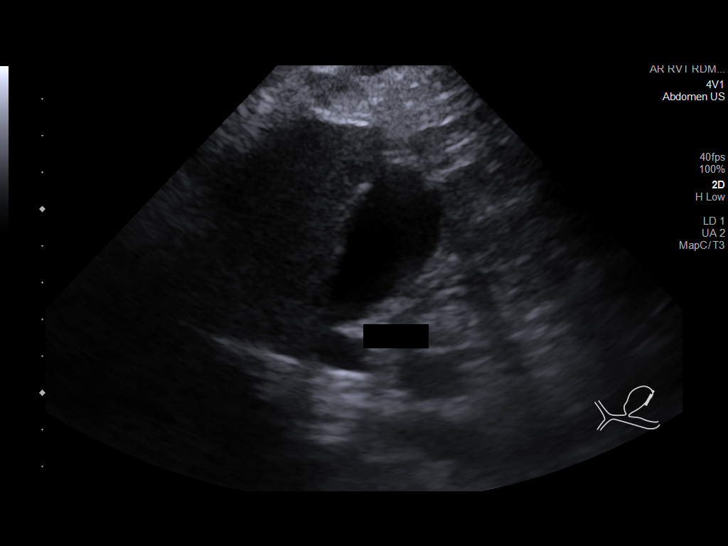
[im 16/91]
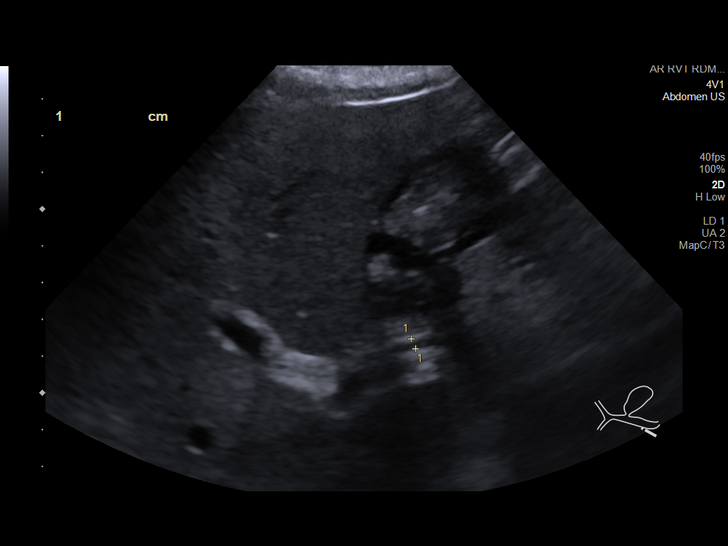
[im 23/91]
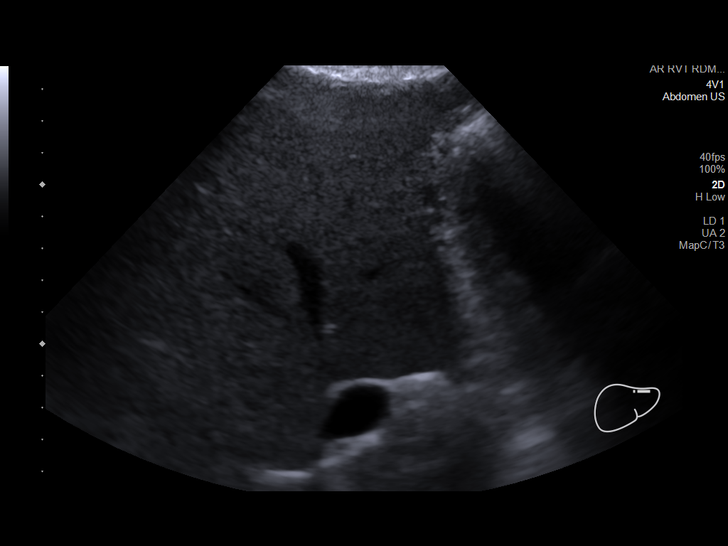
[im 31/91]
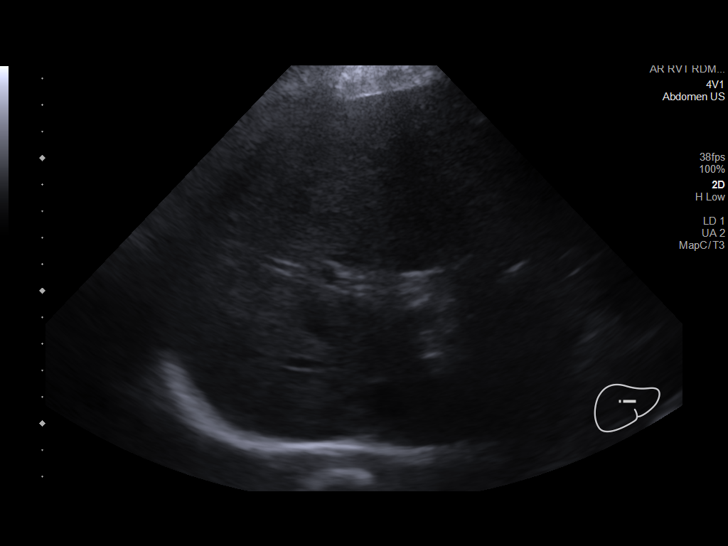
[im 38/91]
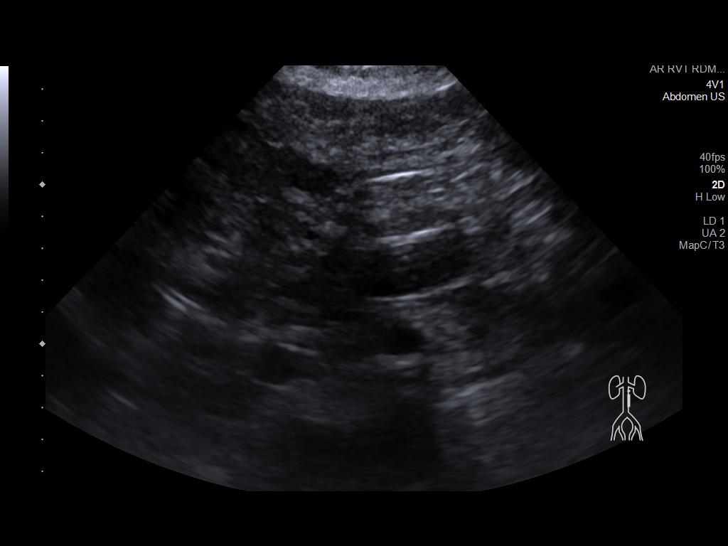
[im 46/91]
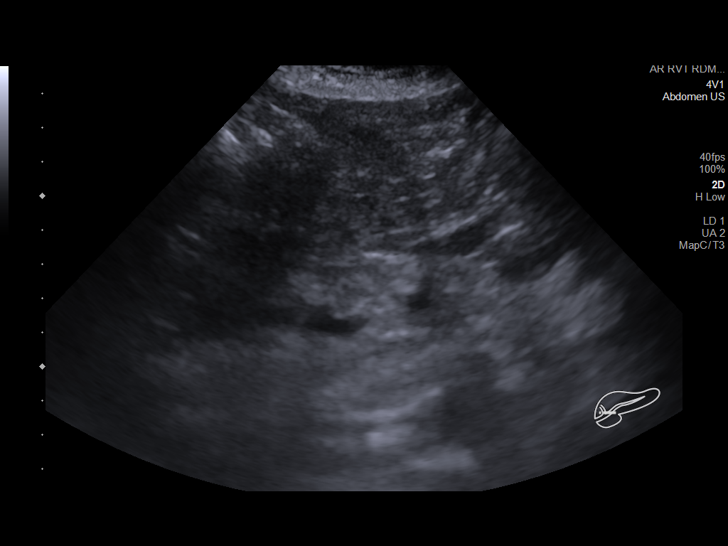
[im 53/91]
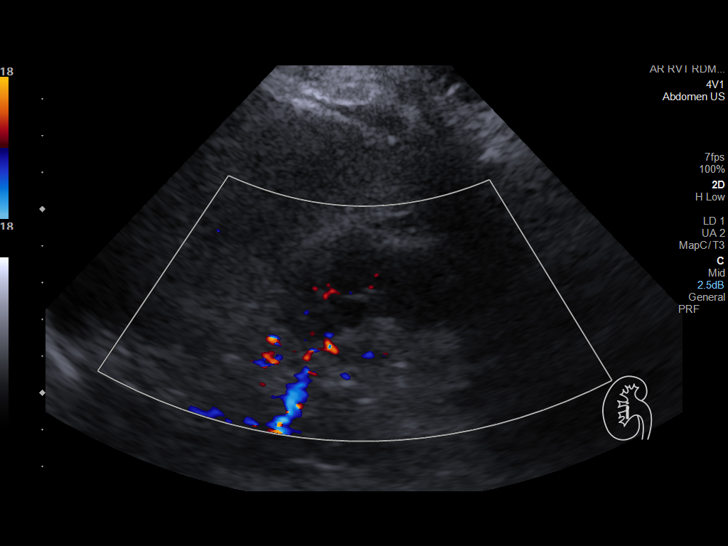
[im 61/91]
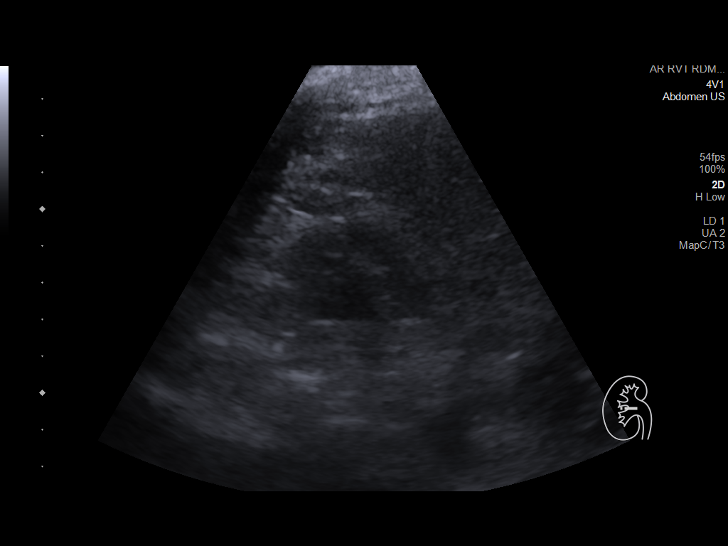
[im 68/91]
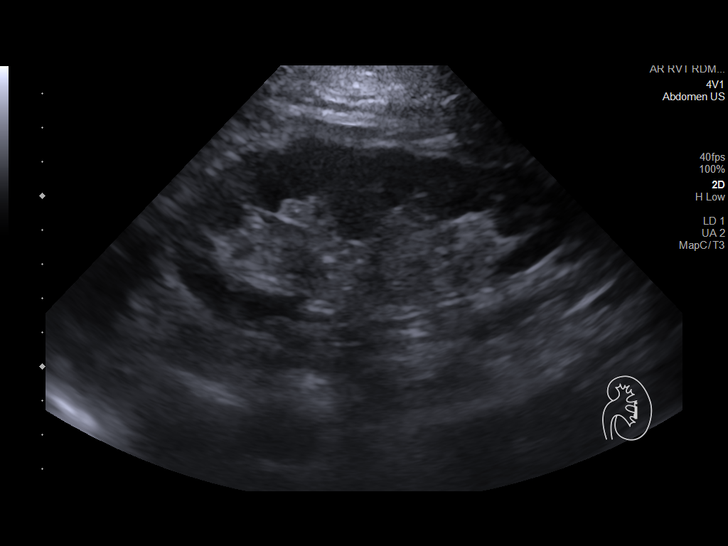
[im 76/91]
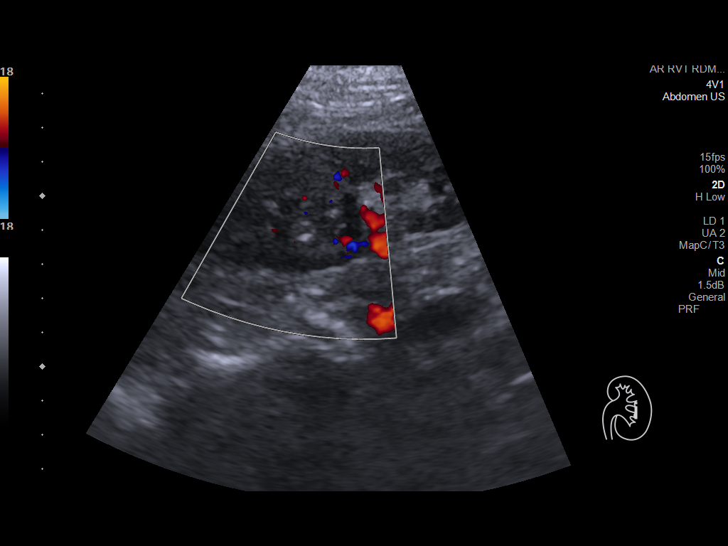
[im 83/91]
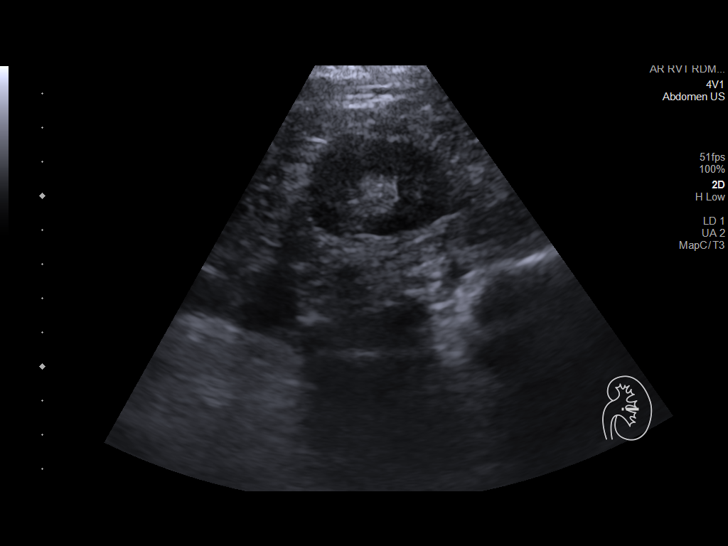
[im 91/91]
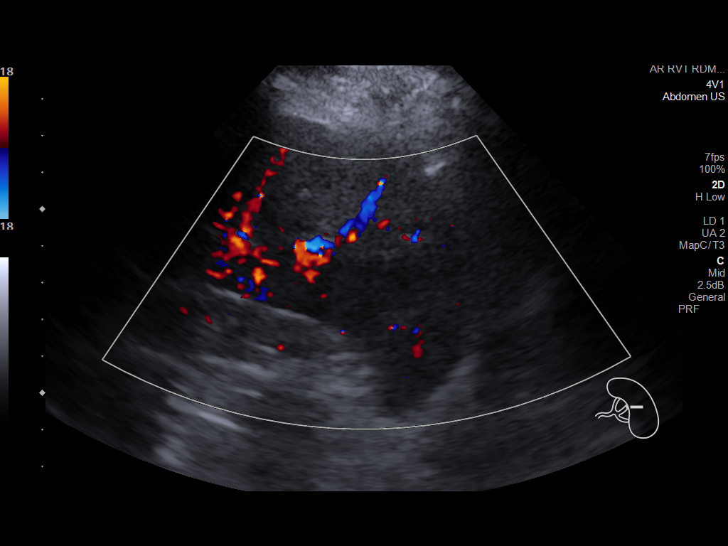

[13 of 25 positions shown; findings below may reference images not displayed]

FINDINGS: Suboptimal evaluation, with poor acoustic penetration secondary to
patient habitus and shadowing from overlying bowel gas.

Gallbladder: No gallstones. Gallbladder wall near the upper limit of
normal in thickness, at 0.3 cm. No sonographic Murphy sign noted by
sonographer.

Common bile duct: Diameter: 0.3 cm

Liver: No focal lesion identified. Increased hepatic parenchymal
echogenicity. Portal vein is patent on color Doppler imaging with
normal direction of blood flow towards the liver.

IVC: No abnormality visualized.

Pancreas: Obscured

Spleen: Size and appearance within normal limits.

Right Kidney: Length: 9.7 cm. Echogenicity within normal limits. No
mass or hydronephrosis visualized.

Left Kidney: Length: 11.7 cm. Echogenicity within normal limits.
Punctate (0.5 cm) echogenic focus with "twinkle" artifact,
consistent with a small nephrolith. No hydronephrosis visualized.

Abdominal aorta: No aneurysm visualized.

Other findings: None.
IMPRESSION: Suboptimal evaluation, within these constraints;

1.  Echogenic liver.
Findings most commonly seen in hepatic steatosis, though may also
represent hepatitis and/or fibrosis.
2. Punctate, nonobstructing LEFT nephrolithiasis.

## 2022-01-19 ENCOUNTER — Encounter: Payer: Self-pay | Admitting: Radiology

## 2022-01-23 ENCOUNTER — Other Ambulatory Visit: Payer: Self-pay | Admitting: Nephrology

## 2022-01-23 ENCOUNTER — Other Ambulatory Visit (HOSPITAL_COMMUNITY): Payer: Self-pay | Admitting: Nephrology

## 2022-01-23 DIAGNOSIS — E1122 Type 2 diabetes mellitus with diabetic chronic kidney disease: Secondary | ICD-10-CM | POA: Diagnosis not present

## 2022-01-23 DIAGNOSIS — I5032 Chronic diastolic (congestive) heart failure: Secondary | ICD-10-CM | POA: Diagnosis not present

## 2022-01-23 DIAGNOSIS — I129 Hypertensive chronic kidney disease with stage 1 through stage 4 chronic kidney disease, or unspecified chronic kidney disease: Secondary | ICD-10-CM | POA: Diagnosis not present

## 2022-01-23 DIAGNOSIS — R809 Proteinuria, unspecified: Secondary | ICD-10-CM | POA: Diagnosis not present

## 2022-01-23 DIAGNOSIS — N189 Chronic kidney disease, unspecified: Secondary | ICD-10-CM | POA: Diagnosis not present

## 2022-01-23 DIAGNOSIS — E1129 Type 2 diabetes mellitus with other diabetic kidney complication: Secondary | ICD-10-CM | POA: Diagnosis not present

## 2022-01-23 DIAGNOSIS — N200 Calculus of kidney: Secondary | ICD-10-CM | POA: Diagnosis not present

## 2022-01-23 DIAGNOSIS — E559 Vitamin D deficiency, unspecified: Secondary | ICD-10-CM | POA: Diagnosis not present

## 2022-01-24 DIAGNOSIS — E1144 Type 2 diabetes mellitus with diabetic amyotrophy: Secondary | ICD-10-CM | POA: Diagnosis not present

## 2022-01-31 ENCOUNTER — Ambulatory Visit (HOSPITAL_COMMUNITY)
Admission: RE | Admit: 2022-01-31 | Discharge: 2022-01-31 | Disposition: A | Discharge status: Home / Self Care | Payer: BC Managed Care – PPO | Source: Ambulatory Visit | Attending: Nephrology | Admitting: Nephrology

## 2022-01-31 ENCOUNTER — Other Ambulatory Visit: Payer: Self-pay

## 2022-01-31 DIAGNOSIS — Z79899 Other long term (current) drug therapy: Secondary | ICD-10-CM | POA: Diagnosis not present

## 2022-01-31 DIAGNOSIS — N189 Chronic kidney disease, unspecified: Secondary | ICD-10-CM | POA: Diagnosis not present

## 2022-01-31 DIAGNOSIS — E1122 Type 2 diabetes mellitus with diabetic chronic kidney disease: Secondary | ICD-10-CM | POA: Diagnosis not present

## 2022-01-31 DIAGNOSIS — E559 Vitamin D deficiency, unspecified: Secondary | ICD-10-CM | POA: Diagnosis not present

## 2022-01-31 DIAGNOSIS — I5032 Chronic diastolic (congestive) heart failure: Secondary | ICD-10-CM | POA: Insufficient documentation

## 2022-01-31 DIAGNOSIS — I129 Hypertensive chronic kidney disease with stage 1 through stage 4 chronic kidney disease, or unspecified chronic kidney disease: Secondary | ICD-10-CM | POA: Diagnosis not present

## 2022-01-31 DIAGNOSIS — E1129 Type 2 diabetes mellitus with other diabetic kidney complication: Secondary | ICD-10-CM | POA: Diagnosis not present

## 2022-01-31 IMAGING — US US RENAL
1 series · 14 of 25 positions shown · non-contrast
Comparison: Abdominal ultrasound [DATE], CT [DATE]

CLINICAL DATA: Chronic kidney disease due to type 2 diabetes
mellitus. Chronic diastolic congestive heart failure. Benign
hypertensive renal disease.

EXAM:
RENAL / URINARY TRACT ULTRASOUND COMPLETE

[Series 1: us renal · 14 of 75 slices shown]
[im 1/75]
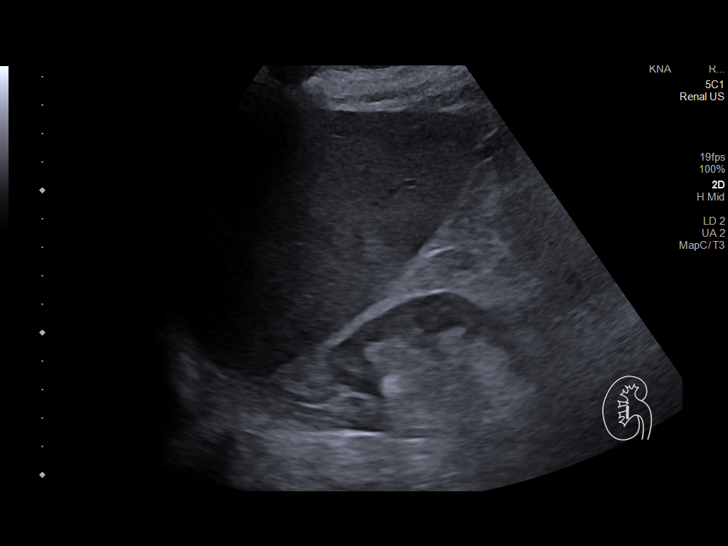
[im 7/75]
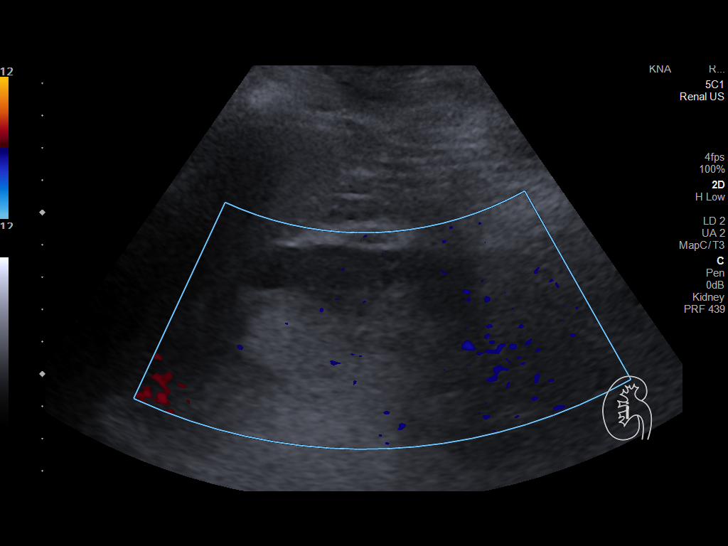
[im 13/75]
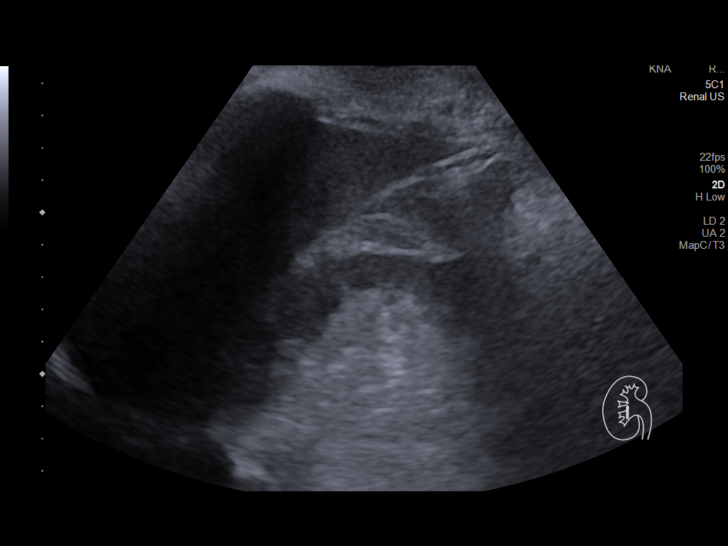
[im 19/75]
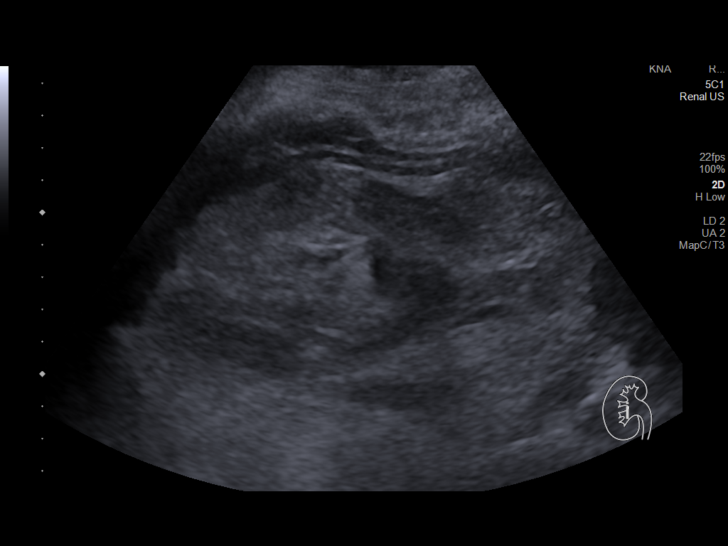
[im 25/75]
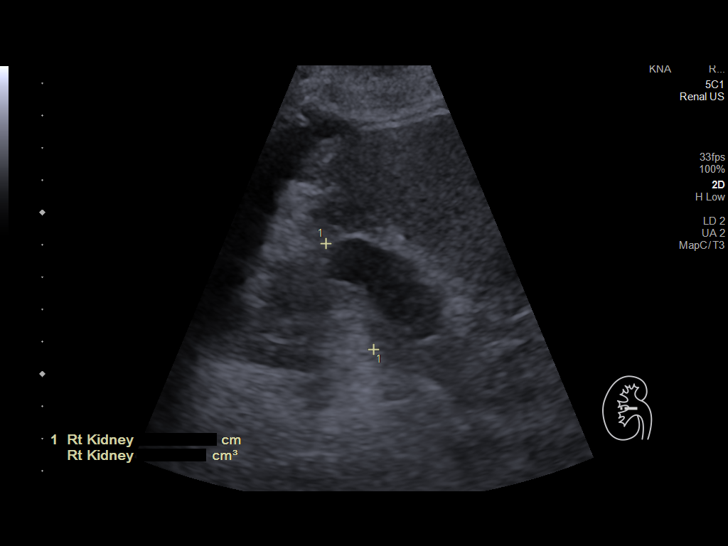
[im 28/75]
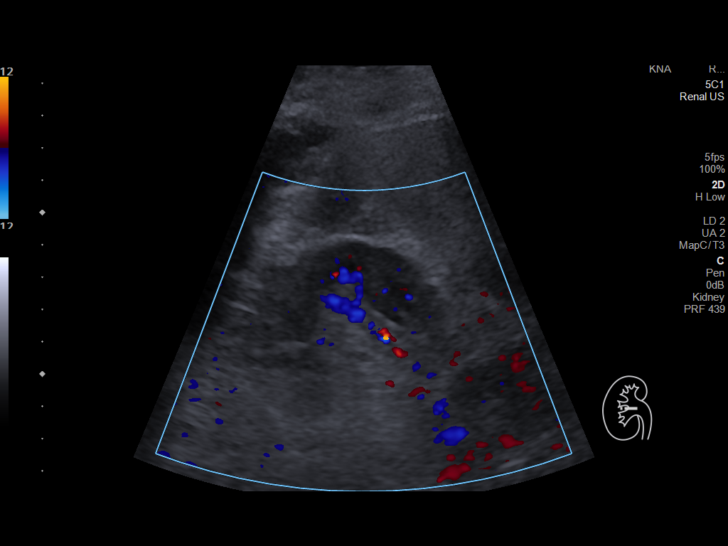
[im 34/75]
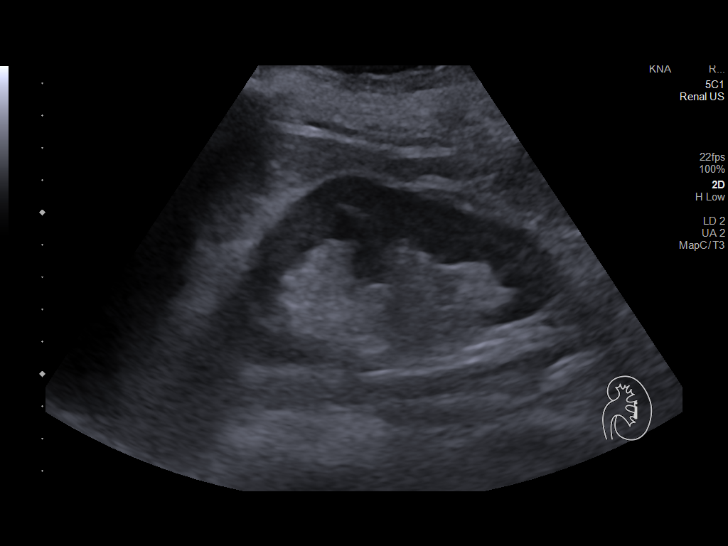
[im 41/75]
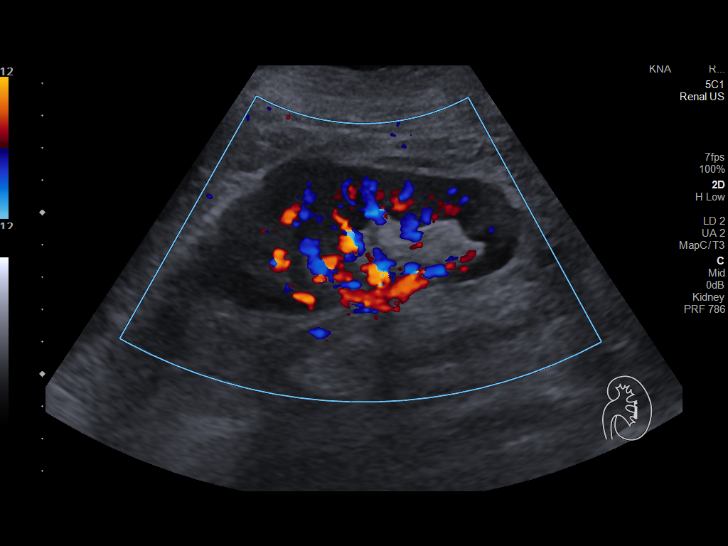
[im 47/75]
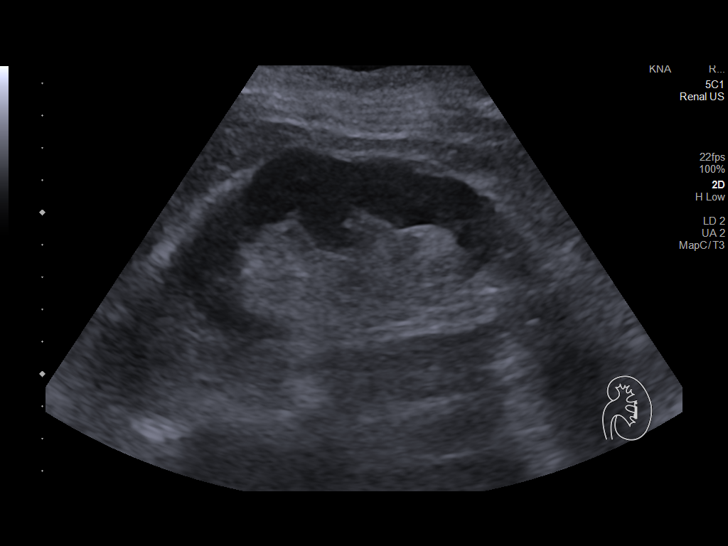
[im 50/75]
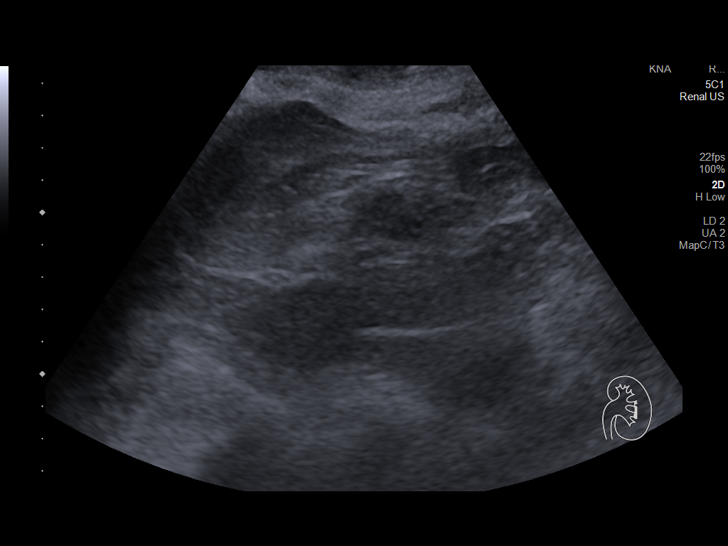
[im 56/75]
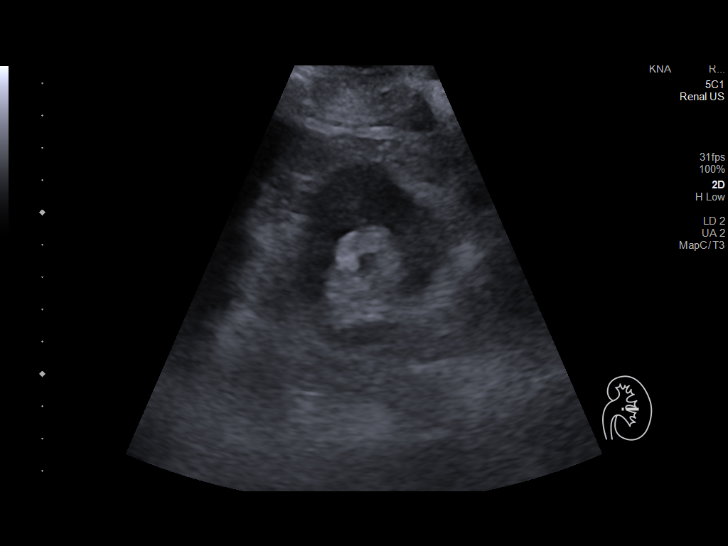
[im 62/75]
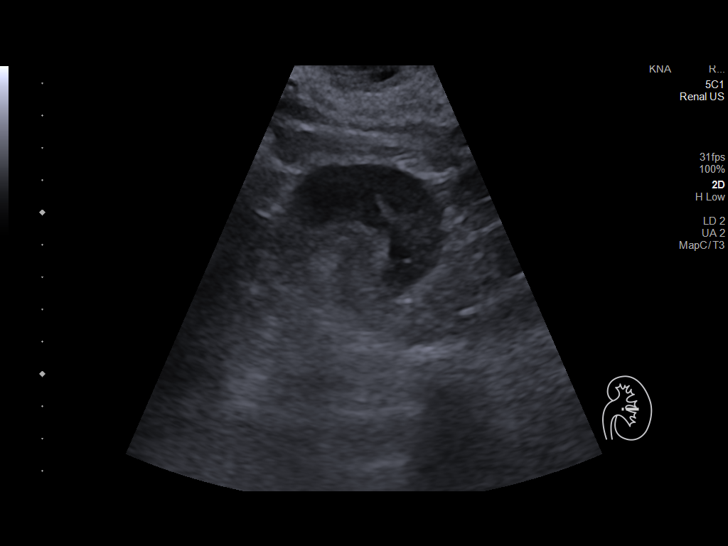
[im 68/75]
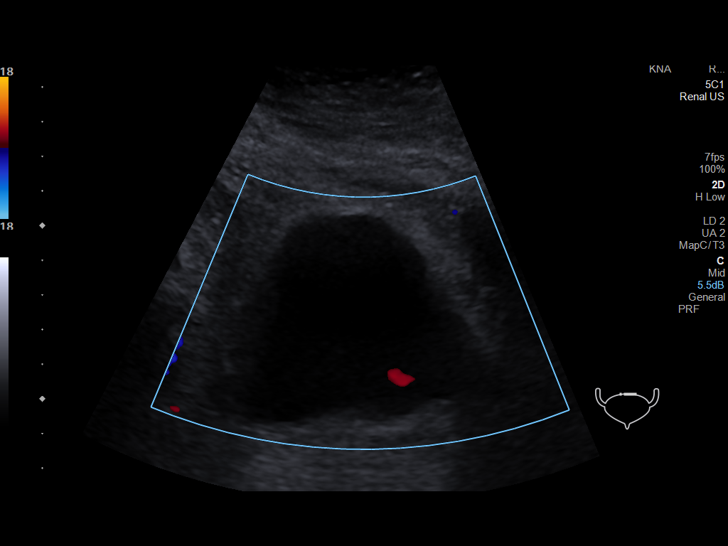
[im 75/75]
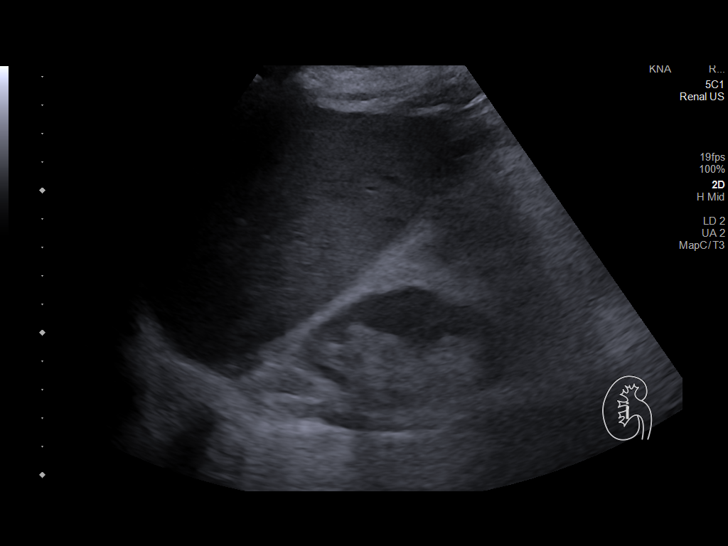

[14 of 25 positions shown; findings below may reference images not displayed]

FINDINGS: Right Kidney:

Renal measurements: 8.6 x 4.0 x 3.6 cm = volume: 65 mL. Mild
increased renal parenchymal echogenicity and thinning of the renal
parenchyma. No hydronephrosis. No focal renal lesion or stone.

Left Kidney:

Renal measurements: 11.1 x 5.9 x 5 cm = volume: 172 mL. No
hydronephrosis. Normal renal parenchymal echogenicity. Previous
question renal calculus is not definitively seen on the current
exam. There is no focal renal lesion.

Bladder:

Appears normal for degree of bladder distention. Both ureteral jets
are demonstrated.

Other:

None.
IMPRESSION: 1. Mild right renal atrophy and increased parenchymal echogenicity.
2. No hydronephrosis or obstructive uropathy.
3. Previous questioned left renal stone is not seen on the current
exam

## 2022-02-21 DIAGNOSIS — R778 Other specified abnormalities of plasma proteins: Secondary | ICD-10-CM | POA: Diagnosis not present

## 2022-02-21 DIAGNOSIS — N189 Chronic kidney disease, unspecified: Secondary | ICD-10-CM | POA: Diagnosis not present

## 2022-02-21 DIAGNOSIS — R809 Proteinuria, unspecified: Secondary | ICD-10-CM | POA: Diagnosis not present

## 2022-02-21 DIAGNOSIS — R748 Abnormal levels of other serum enzymes: Secondary | ICD-10-CM | POA: Diagnosis not present

## 2022-02-21 DIAGNOSIS — D472 Monoclonal gammopathy: Secondary | ICD-10-CM | POA: Diagnosis not present

## 2022-02-21 DIAGNOSIS — I5032 Chronic diastolic (congestive) heart failure: Secondary | ICD-10-CM | POA: Diagnosis not present

## 2022-02-21 DIAGNOSIS — E1129 Type 2 diabetes mellitus with other diabetic kidney complication: Secondary | ICD-10-CM | POA: Diagnosis not present

## 2022-02-21 DIAGNOSIS — E1122 Type 2 diabetes mellitus with diabetic chronic kidney disease: Secondary | ICD-10-CM | POA: Diagnosis not present

## 2022-02-21 DIAGNOSIS — K760 Fatty (change of) liver, not elsewhere classified: Secondary | ICD-10-CM | POA: Diagnosis not present

## 2022-04-03 DIAGNOSIS — E1144 Type 2 diabetes mellitus with diabetic amyotrophy: Secondary | ICD-10-CM | POA: Diagnosis not present

## 2022-04-03 DIAGNOSIS — M5416 Radiculopathy, lumbar region: Secondary | ICD-10-CM | POA: Diagnosis not present

## 2022-04-20 DIAGNOSIS — E785 Hyperlipidemia, unspecified: Secondary | ICD-10-CM | POA: Diagnosis not present

## 2022-04-20 DIAGNOSIS — E1165 Type 2 diabetes mellitus with hyperglycemia: Secondary | ICD-10-CM | POA: Diagnosis not present

## 2022-04-20 DIAGNOSIS — T63301A Toxic effect of unspecified spider venom, accidental (unintentional), initial encounter: Secondary | ICD-10-CM | POA: Diagnosis not present

## 2022-04-20 DIAGNOSIS — Z955 Presence of coronary angioplasty implant and graft: Secondary | ICD-10-CM | POA: Diagnosis not present

## 2022-04-20 DIAGNOSIS — N189 Chronic kidney disease, unspecified: Secondary | ICD-10-CM | POA: Diagnosis not present

## 2022-04-20 DIAGNOSIS — M7989 Other specified soft tissue disorders: Secondary | ICD-10-CM | POA: Diagnosis not present

## 2022-04-20 DIAGNOSIS — Z79899 Other long term (current) drug therapy: Secondary | ICD-10-CM | POA: Diagnosis not present

## 2022-04-20 DIAGNOSIS — M79645 Pain in left finger(s): Secondary | ICD-10-CM | POA: Diagnosis not present

## 2022-04-20 DIAGNOSIS — E1122 Type 2 diabetes mellitus with diabetic chronic kidney disease: Secondary | ICD-10-CM | POA: Diagnosis not present

## 2022-04-20 DIAGNOSIS — K219 Gastro-esophageal reflux disease without esophagitis: Secondary | ICD-10-CM | POA: Diagnosis not present

## 2022-04-20 DIAGNOSIS — Z794 Long term (current) use of insulin: Secondary | ICD-10-CM | POA: Diagnosis not present

## 2022-04-20 DIAGNOSIS — I129 Hypertensive chronic kidney disease with stage 1 through stage 4 chronic kidney disease, or unspecified chronic kidney disease: Secondary | ICD-10-CM | POA: Diagnosis not present

## 2022-04-20 DIAGNOSIS — T63331A Toxic effect of venom of brown recluse spider, accidental (unintentional), initial encounter: Secondary | ICD-10-CM | POA: Diagnosis not present

## 2022-04-20 DIAGNOSIS — Z885 Allergy status to narcotic agent status: Secondary | ICD-10-CM | POA: Diagnosis not present

## 2022-04-20 DIAGNOSIS — L03011 Cellulitis of right finger: Secondary | ICD-10-CM | POA: Diagnosis not present

## 2022-05-04 ENCOUNTER — Inpatient Hospital Stay (HOSPITAL_COMMUNITY): Payer: Medicare Other | Attending: Hematology | Admitting: Hematology

## 2022-05-15 DIAGNOSIS — E1144 Type 2 diabetes mellitus with diabetic amyotrophy: Secondary | ICD-10-CM | POA: Diagnosis not present

## 2022-05-15 DIAGNOSIS — M5416 Radiculopathy, lumbar region: Secondary | ICD-10-CM | POA: Diagnosis not present

## 2022-05-19 DIAGNOSIS — B349 Viral infection, unspecified: Secondary | ICD-10-CM | POA: Diagnosis not present

## 2022-05-19 DIAGNOSIS — R509 Fever, unspecified: Secondary | ICD-10-CM | POA: Diagnosis not present

## 2022-06-05 NOTE — Progress Notes (Deleted)
Cardiology Office Note   Date:  06/05/2022   ID:  Sacred, Roa [birth year], MRN [id number]  PCP:  Redmond School, MD  Cardiologist:   None Referring:  Redmond School, MD  No chief complaint on file.       History of Present Illness: Amanda Mendez is a 66 y.o. female who presents for evaluation of coronary artery disease and history of difficult to control hypertension.   She has had coronary artery disease in the distant past.  She has had a history of chronic diastolic dysfunction.  Last echocardiogram in 2015 suggested moderate left ventricular hypertrophy but well-preserved ejection fraction.  There is a somewhat suboptimal study.  Since I last saw her ***  ***   saw her recently and we tried to get her more engaged in risk reduction and risk factor control.  She was subsequently in the hospital earlier this month.  I reviewed these records for this visit.   She had a fall.  She had severe leg pain but she did not have significant stenosis on peripheral angiogram.     She did have her HCTZ reduced because her creatinine was up although it does not appear to be markedly increased from baseline.  She still having the leg pain which is now unexplained.  Its down her entire left leg.  She is going to see neurology and orthopedic.  She was told she had some disc disease.  She denies any chest pressure.  When she was hospitalized she had been on pain medication.  She did fall and thinks it was related to that.  She did not report passing out.  She has not had any palpitations.  She has had no new shortness of breath.  She is now on a walking cast and getting around with a walker.   Past Medical History:  Diagnosis Date   Anginal pain ([place])    Coronary artery disease    angioplasty 9/13   Exertional dyspnea    "usually; today it was with nothing" (11/03/2012)   GERD (gastroesophageal reflux disease)    Hypercholesterolemia with hypertriglyceridemia    Hypertension     Kidney stones    "I've had them 5 times; lithotripsy 1st time; flushed out  all the others" (11/03/2012)   Obesity (BMI 30-39.9)    Pneumonia 2012   Sinus headache    "weekly" (11/03/2012)   Type II diabetes mellitus ([place]) 2007    Past Surgical History:  Procedure Laterality Date   ABDOMINAL AORTOGRAM W/LOWER EXTREMITY Left 09/12/2021   Procedure: ABDOMINAL AORTOGRAM W/LOWER EXTREMITY;  Surgeon: Serafina Mitchell, MD;  Location: [facility];  Service: Cardiovascular;  Laterality: Left;   CARDIAC CATHETERIZATION  11/25/29013   CORONARY ANGIOPLASTY WITH STENT PLACEMENT  08/2012   "1"   HEMATOMA EVACUATION  09/22/2012   Procedure: EVACUATION HEMATOMA;  Surgeon: Wynonia Sours, MD;  Location: [facility];  Service: Orthopedics;  Laterality: Right;  Evacuation Hematoma right hand   LEFT AND RIGHT HEART CATHETERIZATION WITH CORONARY ANGIOGRAM N/A 08/14/2012   Procedure: LEFT AND RIGHT HEART CATHETERIZATION WITH CORONARY ANGIOGRAM;  Surgeon: Minus Breeding, MD;  Location: [facility] C[facility] LAB;  Service: Cardiovascular;  Laterality: N/A;   LEFT HEART CATHETERIZATION WITH CORONARY ANGIOGRAM N/A 11/03/2012   Procedure: LEFT HEART CATHETERIZATION WITH CORONARY ANGIOGRAM;  Surgeon: Jolaine Artist, MD;  Location: [facility] CATH LAB;  Service: Cardiovascular;  Laterality: N/A;   LITHOTRIPSY  1990's   PERCUTANEOUS CORONARY STENT INTERVENTION (PCI-S)  08/14/2012   Procedure: PERCUTANEOUS CORONARY STENT INTERVENTION (PCI-S);  Surgeon: Minus Breeding, MD;  Location: [facility] CATH LAB;  Service: Cardiovascular;;   SKIN BIOPSY  2012   "off my back; it was nothing fatal; infection caused it" (11/03/2012)   TUBAL LIGATION  1980     Current Outpatient Medications  Medication Sig Dispense Refill   aspirin EC 81 MG tablet Take 81 mg by mouth daily. Swallow whole.     Cholecalciferol (VITAMIN D3) 1.25 MG (50000 UT) TABS Take 5,000 Units by mouth.     cloNIDine (CATAPRES) 0.1 MG tablet Take 0.1 mg by mouth 2 (two)  times daily.     DULoxetine (CYMBALTA) 30 MG capsule Take 1 capsule (30 mg total) by mouth daily. 30 capsule 0   gabapentin (NEURONTIN) 300 MG capsule Take 300 mg by mouth 3 (three) times daily.     glipiZIDE (GLUCOTROL XL) 10 MG 24 hr tablet Take 10 mg by mouth 2 (two) times daily at 8 am and 10 pm.     ipratropium (ATROVENT) 0.06 % nasal spray 2 sprays into each nostril Three (3) times a day.     losartan (COZAAR) 100 MG tablet Take 1 tablet (100 mg total) by mouth daily. 30 tablet 0   metoprolol (LOPRESSOR) 50 MG tablet Take 50 mg by mouth 2 (two) times daily.     Multiple Vitamin (MULTIVITAMIN) capsule Take 1 capsule by mouth daily.     omeprazole (PRILOSEC) 20 MG capsule Take 20 mg by mouth daily.     oxyCODONE (OXY IR/ROXICODONE) 5 MG immediate release tablet Take 1 tablet (5 mg total) by mouth every 4 (four) hours as needed for moderate pain or severe pain. 30 tablet 0   polyethylene glycol (MIRALAX / GLYCOLAX) 17 g packet Take 17 g by mouth daily as needed for mild constipation. 14 each 0   rosuvastatin (CRESTOR) 10 MG tablet Take 1 tablet (10 mg total) by mouth daily. 90 tablet 3   sitaGLIPtin (JANUVIA) 100 MG tablet Take 100 mg by mouth daily.     No current facility-administered medications for this visit.    Allergies:   Dapagliflozin and Codeine    ROS:  Please see the history of present illness.   Otherwise, review of systems are positive for ***.   All other systems are reviewed and negative.    PHYSICAL EXAM: VS:  There were no vitals taken for this visit. , BMI There is no height or weight on file to calculate BMI. GENERAL:  Well appearing NECK:  No jugular venous distention, waveform within normal limits, carotid upstroke brisk and symmetric, no bruits, no thyromegaly LUNGS:  Clear to auscultation bilaterally CHEST:  Unremarkable HEART:  PMI not displaced or sustained,S1 and S2 within normal limits, no S3, no S4, no clicks, no rubs, *** murmurs ABD:  Flat, positive  bowel sounds normal in frequency in pitch, no bruits, no rebound, no guarding, no midline pulsatile mass, no hepatomegaly, no splenomegaly EXT:  2 plus pulses throughout, no edema, no cyanosis no clubbing     ***GENERAL:  Well appearing NECK:  No jugular venous distention, waveform within normal limits, carotid upstroke brisk and symmetric, no bruits, no thyromegaly LUNGS:  Clear to auscultation bilaterally CHEST:  Unremarkable HEART:  PMI not displaced or sustained,S1 and S2 within normal limits, no S3, no S4, no clicks, no rubs, no murmurs,  ABD:  Flat, positive bowel sounds normal in frequency in pitch, no bruits, no rebound, no guarding, no midline  pulsatile mass, no hepatomegaly, no splenomegaly EXT:  2 plus pulses upper, mild decreased dorsalis pedis bilaterally, no edema, positive cyanosis no clubbing   EKG:  EKG *** ordered today. ***   Recent Labs: 09/10/2021: ALT 30 09/12/2021: TSH 3.913 09/16/2021: BUN 31; Creatinine, Ser 1.25; Hemoglobin 11.9; Magnesium 2.1; Platelets 311; Potassium 4.1; Sodium 135    Lipid Panel    Component Value Date/Time   CHOL 275 (H) 08/14/2012 0251   TRIG 557 (H) 08/14/2012 0251   HDL 43 08/14/2012 0251   CHOLHDL 6.4 08/14/2012 0251   VLDL UNABLE TO CALCULATE IF TRIGLYCERIDE OVER 400 mg/dL 08/14/2012 0251   LDLCALC UNABLE TO CALCULATE IF TRIGLYCERIDE OVER 400 mg/dL 08/14/2012 0251      Wt Readings from Last 3 Encounters:  09/30/21 164 lb (74.4 kg)  09/27/21 165 lb (74.8 kg)  09/16/21 167 lb 12.3 oz (76.1 kg)      Other studies Reviewed: Additional studies/ records that were reviewed today include: *** Review of the above records demonstrates:  Please see elsewhere in the note.     ASSESSMENT AND PLAN:  CAD - ***  The patient has no new sypmtoms.  No further cardiovascular testing is indicated.  We will continue with aggressive risk reduction and meds as listed.  \  Diabetes mellitus  - A1c was most recently *** 11.8.  This is  being actively managed by her primary provider.   Hypertension - The blood pressure is *** upper limits but I would like it to be in the 128N to 867E systolic.  She is now off HCTZ and I think it is at this level at home and she will keep a blood pressure diary.  C  Hypercholesterolemia with hypertriglyceridemia - LDL was *** 140.  I sent her to the  [facility]. rather than start her on PCSK9 a attempted Crestor.  She has not tolerated statins previously because of some elevated liver enzymes.  We are going to repeat lipid profile in mid November and make adjustments from there.    Diastolic heart failure - ***  She seems to be euvolemic.  No change in therapy.    Current medicines are reviewed at length with the patient today.  The patient does not have concerns regarding medicines.  The following changes have been made: ***  Labs/ tests ordered today include: ***  No orders of the defined types were placed in this encounter.      Disposition:   FU with ***  months   Signed, Minus Breeding, MD  06/05/2022 8:34 PM    [place]

## 2022-06-06 ENCOUNTER — Ambulatory Visit: Payer: BC Managed Care – PPO | Admitting: Cardiology

## 2022-06-06 DIAGNOSIS — I10 Essential (primary) hypertension: Secondary | ICD-10-CM

## 2022-06-06 DIAGNOSIS — E118 Type 2 diabetes mellitus with unspecified complications: Secondary | ICD-10-CM

## 2022-06-06 DIAGNOSIS — I5032 Chronic diastolic (congestive) heart failure: Secondary | ICD-10-CM

## 2022-06-06 DIAGNOSIS — E785 Hyperlipidemia, unspecified: Secondary | ICD-10-CM

## 2022-06-06 DIAGNOSIS — I2510 Atherosclerotic heart disease of native coronary artery without angina pectoris: Secondary | ICD-10-CM

## 2022-07-06 DIAGNOSIS — I10 Essential (primary) hypertension: Secondary | ICD-10-CM | POA: Diagnosis not present

## 2022-07-06 DIAGNOSIS — E1165 Type 2 diabetes mellitus with hyperglycemia: Secondary | ICD-10-CM | POA: Diagnosis not present

## 2022-07-06 DIAGNOSIS — I110 Hypertensive heart disease with heart failure: Secondary | ICD-10-CM | POA: Diagnosis not present

## 2022-07-06 DIAGNOSIS — I5032 Chronic diastolic (congestive) heart failure: Secondary | ICD-10-CM | POA: Diagnosis not present

## 2022-07-17 DIAGNOSIS — I700 Atherosclerosis of aorta: Secondary | ICD-10-CM | POA: Diagnosis not present

## 2022-07-17 DIAGNOSIS — L03011 Cellulitis of right finger: Secondary | ICD-10-CM | POA: Diagnosis not present

## 2022-07-17 DIAGNOSIS — E118 Type 2 diabetes mellitus with unspecified complications: Secondary | ICD-10-CM | POA: Diagnosis not present

## 2022-07-17 DIAGNOSIS — I10 Essential (primary) hypertension: Secondary | ICD-10-CM | POA: Diagnosis not present

## 2022-07-19 DIAGNOSIS — L03011 Cellulitis of right finger: Secondary | ICD-10-CM | POA: Diagnosis not present

## 2022-07-19 DIAGNOSIS — E118 Type 2 diabetes mellitus with unspecified complications: Secondary | ICD-10-CM | POA: Diagnosis not present

## 2022-07-20 DIAGNOSIS — M79644 Pain in right finger(s): Secondary | ICD-10-CM | POA: Diagnosis not present

## 2022-07-27 DIAGNOSIS — M79644 Pain in right finger(s): Secondary | ICD-10-CM | POA: Diagnosis not present

## 2022-08-02 DIAGNOSIS — R809 Proteinuria, unspecified: Secondary | ICD-10-CM | POA: Diagnosis not present

## 2022-08-02 DIAGNOSIS — E1122 Type 2 diabetes mellitus with diabetic chronic kidney disease: Secondary | ICD-10-CM | POA: Diagnosis not present

## 2022-08-02 DIAGNOSIS — I5032 Chronic diastolic (congestive) heart failure: Secondary | ICD-10-CM | POA: Diagnosis not present

## 2022-08-02 DIAGNOSIS — N189 Chronic kidney disease, unspecified: Secondary | ICD-10-CM | POA: Diagnosis not present

## 2022-08-02 DIAGNOSIS — E1129 Type 2 diabetes mellitus with other diabetic kidney complication: Secondary | ICD-10-CM | POA: Diagnosis not present

## 2022-08-08 DIAGNOSIS — N189 Chronic kidney disease, unspecified: Secondary | ICD-10-CM | POA: Diagnosis not present

## 2022-08-08 DIAGNOSIS — E1122 Type 2 diabetes mellitus with diabetic chronic kidney disease: Secondary | ICD-10-CM | POA: Diagnosis not present

## 2022-08-08 DIAGNOSIS — R809 Proteinuria, unspecified: Secondary | ICD-10-CM | POA: Diagnosis not present

## 2022-08-08 DIAGNOSIS — I129 Hypertensive chronic kidney disease with stage 1 through stage 4 chronic kidney disease, or unspecified chronic kidney disease: Secondary | ICD-10-CM | POA: Diagnosis not present

## 2022-08-08 DIAGNOSIS — I5032 Chronic diastolic (congestive) heart failure: Secondary | ICD-10-CM | POA: Diagnosis not present

## 2022-08-08 DIAGNOSIS — E1129 Type 2 diabetes mellitus with other diabetic kidney complication: Secondary | ICD-10-CM | POA: Diagnosis not present

## 2022-08-21 NOTE — Progress Notes (Unsigned)
Cardiology Office Note   Date:  08/22/2022   ID:  Amanda Mendez, DOB Apr 29, 1956, MRN [id number]  PCP:  Redmond School, MD  Cardiologist:   None Referring:  Redmond School, MD  Chief Complaint  Patient presents with   Chest Pain     History of Present Illness: Amanda Mendez is a 66 y.o. female who presents for evaluation of coronary artery disease and history of difficult to control hypertension.   She has had coronary artery disease in the distant past.  She has had a history of chronic diastolic dysfunction.  Last echocardiogram in 2015 suggested moderate left ventricular hypertrophy but well-preserved ejection fraction.  There is a somewhat suboptimal study.  She presents for follow up.  She is says that for the last couple of months she has been getting some chest discomfort.  She really cannot quantify or qualify this but seem to be mild and happening sporadically.  She cannot bring it on with activity.  However, she is relatively sedentary walking with a cane.  She thinks it is somewhat reminiscent of her previous angina.  It seems to be mild.  There is no associated nausea vomiting or diaphoresis.  She is not having any new palpitations, presyncope or syncope.  She had leg pain.  She was seen in the hospital for this and had vascular procedure in October.  I reviewed these records.  She has a smaller vessel disease managed medically.  She saw neurology and is being managed for neuropathic pain and has had some improvement.   Past Medical History:  Diagnosis Date   Anginal pain ([place])    Coronary artery disease    angioplasty 9/13   Exertional dyspnea    "usually; today it was with nothing" (11/03/2012)   GERD (gastroesophageal reflux disease)    Hypercholesterolemia with hypertriglyceridemia    Hypertension    Kidney stones    "I've had them 5 times; lithotripsy 1st time; flushed out  all the others" (11/03/2012)   Obesity (BMI 30-39.9)    Pneumonia 2012   Sinus  headache    "weekly" (11/03/2012)   Type II diabetes mellitus ([place]) 2007    Past Surgical History:  Procedure Laterality Date   ABDOMINAL AORTOGRAM W/LOWER EXTREMITY Left 09/12/2021   Procedure: ABDOMINAL AORTOGRAM W/LOWER EXTREMITY;  Surgeon: Serafina Mitchell, MD;  Location: [place];  Service: Cardiovascular;  Laterality: Left;   CARDIAC CATHETERIZATION  11/25/29013   CORONARY ANGIOPLASTY WITH STENT PLACEMENT  08/2012   "1"   HEMATOMA EVACUATION  09/22/2012   Procedure: EVACUATION HEMATOMA;  Surgeon: Wynonia Sours, MD;  Location: [facility];  Service: Orthopedics;  Laterality: Right;  Evacuation Hematoma right hand   LEFT AND RIGHT HEART CATHETERIZATION WITH CORONARY ANGIOGRAM N/A 08/14/2012   Procedure: LEFT AND RIGHT HEART CATHETERIZATION WITH CORONARY ANGIOGRAM;  Surgeon: Minus Breeding, MD;  Location: [facility] CATH LAB;  Service: Cardiovascular;  Laterality: N/A;   LEFT HEART CATHETERIZATION WITH CORONARY ANGIOGRAM N/A 11/03/2012   Procedure: LEFT HEART CATHETERIZATION WITH CORONARY ANGIOGRAM;  Surgeon: Jolaine Artist, MD;  Location: [facility] CATH LAB;  Service: Cardiovascular;  Laterality: N/A;   LITHOTRIPSY  1990's   PERCUTANEOUS CORONARY STENT INTERVENTION (PCI-S)  08/14/2012   Procedure: PERCUTANEOUS CORONARY STENT INTERVENTION (PCI-S);  Surgeon: Minus Breeding, MD;  Location: [facility] CATH LAB;  Service: Cardiovascular;;   SKIN BIOPSY  2012   "off my back; it was nothing fatal; infection caused it" (11/03/2012)   [place]  Current Outpatient Medications  Medication Sig Dispense Refill   aspirin EC 81 MG tablet Take 81 mg by mouth daily. Swallow whole.     Cholecalciferol (VITAMIN D3) 1.25 MG (50000 UT) TABS Take 5,000 Units by mouth.     DULoxetine (CYMBALTA) 30 MG capsule Take 1 capsule (30 mg total) by mouth daily. 30 capsule 0   glimepiride (AMARYL) 2 MG tablet Take 2 mg by mouth 3 (three) times daily.     LANTUS SOLOSTAR 100 UNIT/ML Solostar Pen  Inject into the skin.     metoprolol (LOPRESSOR) 50 MG tablet Take 50 mg by mouth 2 (two) times daily.     omeprazole (PRILOSEC) 20 MG capsule Take 20 mg by mouth daily.     oxyCODONE (OXY IR/ROXICODONE) 5 MG immediate release tablet Take 1 tablet (5 mg total) by mouth every 4 (four) hours as needed for moderate pain or severe pain. 30 tablet 0   rosuvastatin (CRESTOR) 20 MG tablet Take 1 tablet (20 mg total) by mouth daily. 90 tablet 3   losartan-hydrochlorothiazide (HYZAAR) 100-25 MG tablet Take 1 tablet by mouth daily. 90 tablet 3   No current facility-administered medications for this visit.    Allergies:   Dapagliflozin and Codeine    ROS:  Please see the history of present illness.   Otherwise, review of systems are positive for none.   All other systems are reviewed and negative.    PHYSICAL EXAM: VS:  BP (!) 142/80   Pulse 72   Ht '5\' 2"'$  (1.575 m)   Wt 177 lb (80.3 kg)   BMI 32.37 kg/m  , BMI Body mass index is 32.37 kg/m. GENERAL:  Well appearing NECK:  No jugular venous distention, waveform within normal limits, carotid upstroke brisk and symmetric, no bruits, no thyromegaly LUNGS:  Clear to auscultation bilaterally CHEST:  Unremarkable HEART:  PMI not displaced or sustained,S1 and S2 within normal limits, no S3, no S4, no clicks, no rubs, no murmurs ABD:  Flat, positive bowel sounds normal in frequency in pitch, no bruits, no rebound, no guarding, no midline pulsatile mass, no hepatomegaly, no splenomegaly EXT:  2 plus pulses throughout, no edema, no cyanosis no clubbing   core but she is acute single Episode of EKG:  EKG is ordered today. Sinus rhythm, rate 72, old anteroseptal infarct, lateral T wave inversions unchanged from previous.   Recent Labs: 09/10/2021: ALT 30 09/12/2021: TSH 3.913 09/16/2021: BUN 31; Creatinine, Ser 1.25; Hemoglobin 11.9; Magnesium 2.1; Platelets 311; Potassium 4.1; Sodium 135    Lipid Panel    Component Value Date/Time   CHOL 275 (H)  08/14/2012 0251   TRIG 557 (H) 08/14/2012 0251   HDL 43 08/14/2012 0251   CHOLHDL 6.4 08/14/2012 0251   VLDL UNABLE TO CALCULATE IF TRIGLYCERIDE OVER 400 mg/dL 08/14/2012 0251   LDLCALC UNABLE TO CALCULATE IF TRIGLYCERIDE OVER 400 mg/dL 08/14/2012 0251      Wt Readings from Last 3 Encounters:  08/22/22 177 lb (80.3 kg)  09/30/21 164 lb (74.4 kg)  09/27/21 165 lb (74.8 kg)      Other studies Reviewed: Additional studies/ records that were reviewed today include: Labs Review of the above records demonstrates:  Please see elsewhere in the note.     ASSESSMENT AND PLAN:  CAD - She is having some resting chest discomfort that is new.  I will screen her with a PET scan.  Diabetes mellitus  - A1c was 8.5 which is down from 11.8.  I  congratulated her on this.  I will defer to her primary provider.  Hypertension - The blood pressure is mildly elevated but she has been acute blood pressure diary.   Hypercholesterolemia with hypertriglyceridemia - LDL was 87 which was down from 140.  I am going to increase her Crestor to 20 mg daily and she had a lipid profile in 3 months.  Diastolic heart failure - She seems to be euvolemic.  I think she would be a reasonable candidate for Wilder Glade though she does have some renal insufficiency.  If her renal function stable we might start this at the next visit.    Current medicines are reviewed at length with the patient today.  The patient does not have concerns regarding medicines.  The following changes have been made: As above  Labs/ tests ordered today include:   Orders Placed This Encounter  Procedures   NM PET CT CARDIAC PERFUSION MULTI W/ABSOLUTE BLOODFLOW   Lipid panel   EKG 12-Lead       Disposition:   FU with 12 months   Signed, Minus Breeding, MD  08/22/2022 4:25 PM    [facility]

## 2022-08-22 ENCOUNTER — Other Ambulatory Visit: Payer: Self-pay | Admitting: Cardiology

## 2022-08-22 ENCOUNTER — Ambulatory Visit (INDEPENDENT_AMBULATORY_CARE_PROVIDER_SITE_OTHER): Payer: Medicare Other | Admitting: Cardiology

## 2022-08-22 ENCOUNTER — Encounter: Payer: Self-pay | Admitting: Cardiology

## 2022-08-22 VITALS — BP 142/80 | HR 72 | Ht 62.0 in | Wt 177.0 lb

## 2022-08-22 DIAGNOSIS — E785 Hyperlipidemia, unspecified: Secondary | ICD-10-CM | POA: Diagnosis not present

## 2022-08-22 DIAGNOSIS — R072 Precordial pain: Secondary | ICD-10-CM

## 2022-08-22 DIAGNOSIS — I10 Essential (primary) hypertension: Secondary | ICD-10-CM | POA: Diagnosis not present

## 2022-08-22 DIAGNOSIS — I2510 Atherosclerotic heart disease of native coronary artery without angina pectoris: Secondary | ICD-10-CM

## 2022-08-22 DIAGNOSIS — E118 Type 2 diabetes mellitus with unspecified complications: Secondary | ICD-10-CM

## 2022-08-22 DIAGNOSIS — I5032 Chronic diastolic (congestive) heart failure: Secondary | ICD-10-CM | POA: Diagnosis not present

## 2022-08-22 MED ORDER — LOSARTAN POTASSIUM-HCTZ 100-25 MG PO TABS: 1.0000 | ORAL_TABLET | Freq: Every day | ORAL | 3 refills | Status: DC

## 2022-08-22 MED ORDER — ROSUVASTATIN CALCIUM 20 MG PO TABS: 20.0000 mg | ORAL_TABLET | Freq: Every day | ORAL | 3 refills | Status: DC

## 2022-08-22 NOTE — Patient Instructions (Addendum)
Medication Instructions:  Please increase your Crestor to 20 mg a day. Continue all other medications as listed.  *If you need a refill on your cardiac medications before your next appointment, please call your pharmacy*  Please have fating lipid panel in 3 months.  Can be completed at your primary care Dr or any [facility].  Testing/Procedures: How to Prepare for Your Cardiac PET/CT Stress Test:  1. Please do not take these medications before your test:   Medications that may interfere with the cardiac pharmacological stress agent (ex. nitrates - including erectile dysfunction medications or beta-blockers) the day of the exam. (Erectile dysfunction medication should be held for at least 72 hrs prior to test) Theophylline containing medications for 12 hours. Dipyridamole 48 hours prior to the test. Your remaining medications may be taken with water.  2. Nothing to eat or drink, except water, 3 hours prior to arrival time.   NO caffeine/decaffeinated products, or chocolate 12 hours prior to arrival.  3. NO perfume, cologne or lotion  4. Total time is 1 to 2 hours; you may want to bring reading material for the waiting time.  5. Please report to Admitting at the [facility] Main Entrance 60 minutes early for your test.  [place], [place] [address]  Diabetic Preparation:  Hold oral medications. You may take NPH and Lantus insulin. Do not take Humalog or Humulin R (Regular Insulin) the day of your test. Check blood sugars prior to leaving the house. If able to eat breakfast prior to 3 hour fasting, you may take all medications, including your insulin, Do not worry if you miss your breakfast dose of insulin - start at your next meal.  IF YOU THINK YOU MAY BE PREGNANT, OR ARE NURSING PLEASE INFORM THE TECHNOLOGIST.  In preparation for your appointment, medication and supplies will be purchased.  Appointment availability is limited, so if you need to cancel or  reschedule, please call the Radiology Department at [phone]  24 hours in advance to avoid a cancellation fee of $100.00  What to Expect After you Arrive:  Once you arrive and check in for your appointment, you will be taken to a preparation room within the Radiology Department.  A technologist or Nurse will obtain your medical history, verify that you are correctly prepped for the exam, and explain the procedure.  Afterwards,  an IV will be started in your arm and electrodes will be placed on your skin for EKG monitoring during the stress portion of the exam. Then you will be escorted to the PET/CT scanner.  There, staff will get you positioned on the scanner and obtain a blood pressure and EKG.  During the exam, you will continue to be connected to the EKG and blood pressure machines.  A small, safe amount of a radioactive tracer will be injected in your IV to obtain a series of pictures of your heart along with an injection of a stress agent.    After your Exam:  It is recommended that you eat a meal and drink a caffeinated beverage to counter act any effects of the stress agent.  Drink plenty of fluids for the remainder of the day and urinate frequently for the first couple of hours after the exam.  Your doctor will inform you of your test results within 7-10 business days.  For questions about your test or how to prepare for your test, please call: Marchia Bond, Cardiac Imaging Nurse Navigator  Gordy Clement, Cardiac  Imaging Nurse Navigator Office: [phone]  Follow-Up: At [facility], you and your health needs are our priority.  As part of our continuing mission to provide you with exceptional heart care, we have created designated Provider Care Teams.  These Care Teams include your primary Cardiologist (physician) and Advanced Practice Providers (APPs -  Physician Assistants and Nurse Practitioners) who all work together to provide you with the care you need, when you need  it.  We recommend signing up for the patient portal called "MyChart".  Sign up information is provided on this After Visit Summary.  MyChart is used to connect with patients for Virtual Visits (Telemedicine).  Patients are able to view lab/test results, encounter notes, upcoming appointments, etc.  Non-urgent messages can be sent to your provider as well.   To learn more about what you can do with MyChart, go to [email or web address].    Your next appointment:   To be determined  The format for your next appointment:   In Person  Provider:   Minus Breeding, MD    Important Information About Sugar

## 2022-09-08 DIAGNOSIS — I10 Essential (primary) hypertension: Secondary | ICD-10-CM | POA: Diagnosis not present

## 2022-09-08 DIAGNOSIS — E1165 Type 2 diabetes mellitus with hyperglycemia: Secondary | ICD-10-CM | POA: Diagnosis not present

## 2022-10-01 ENCOUNTER — Telehealth (HOSPITAL_COMMUNITY): Payer: Self-pay | Admitting: *Deleted

## 2022-10-01 NOTE — Telephone Encounter (Signed)
Attempted to call patient regarding upcoming cardiac PET appointment. Left message on voicemail with name and callback number  Danniel Grenz RN Navigator Cardiac Imaging [facility] [phone] Office [phone] Cell  

## 2022-10-02 ENCOUNTER — Other Ambulatory Visit: Payer: Self-pay

## 2022-10-02 ENCOUNTER — Emergency Department (HOSPITAL_COMMUNITY): Payer: Medicare Other

## 2022-10-02 ENCOUNTER — Encounter (HOSPITAL_COMMUNITY): Payer: Self-pay | Admitting: Emergency Medicine

## 2022-10-02 ENCOUNTER — Inpatient Hospital Stay (HOSPITAL_COMMUNITY)
Admission: EM | Admit: 2022-10-02 | Discharge: 2022-10-18 | DRG: 233 | Disposition: A | Discharge status: Home Health | Payer: Medicare Other | Attending: Surgery | Admitting: Surgery

## 2022-10-02 ENCOUNTER — Inpatient Hospital Stay (HOSPITAL_COMMUNITY): Payer: Medicare Other

## 2022-10-02 ENCOUNTER — Encounter (HOSPITAL_BASED_OUTPATIENT_CLINIC_OR_DEPARTMENT_OTHER)
Admission: RE | Admit: 2022-10-02 | Discharge: 2022-10-02 | Disposition: A | Discharge status: Home / Self Care | Payer: Medicare Other | Source: Ambulatory Visit | Attending: Cardiology | Admitting: Cardiology

## 2022-10-02 DIAGNOSIS — Z794 Long term (current) use of insulin: Secondary | ICD-10-CM

## 2022-10-02 DIAGNOSIS — I517 Cardiomegaly: Secondary | ICD-10-CM | POA: Diagnosis not present

## 2022-10-02 DIAGNOSIS — I10 Essential (primary) hypertension: Secondary | ICD-10-CM | POA: Diagnosis present

## 2022-10-02 DIAGNOSIS — R079 Chest pain, unspecified: Secondary | ICD-10-CM | POA: Diagnosis present

## 2022-10-02 DIAGNOSIS — I2089 Other forms of angina pectoris: Secondary | ICD-10-CM | POA: Diagnosis not present

## 2022-10-02 DIAGNOSIS — E669 Obesity, unspecified: Secondary | ICD-10-CM | POA: Diagnosis present

## 2022-10-02 DIAGNOSIS — I129 Hypertensive chronic kidney disease with stage 1 through stage 4 chronic kidney disease, or unspecified chronic kidney disease: Secondary | ICD-10-CM | POA: Diagnosis not present

## 2022-10-02 DIAGNOSIS — Z87891 Personal history of nicotine dependence: Secondary | ICD-10-CM

## 2022-10-02 DIAGNOSIS — I5032 Chronic diastolic (congestive) heart failure: Secondary | ICD-10-CM | POA: Diagnosis present

## 2022-10-02 DIAGNOSIS — E7800 Pure hypercholesterolemia, unspecified: Secondary | ICD-10-CM | POA: Diagnosis not present

## 2022-10-02 DIAGNOSIS — R519 Headache, unspecified: Secondary | ICD-10-CM | POA: Diagnosis not present

## 2022-10-02 DIAGNOSIS — E781 Pure hyperglyceridemia: Secondary | ICD-10-CM | POA: Diagnosis not present

## 2022-10-02 DIAGNOSIS — I998 Other disorder of circulatory system: Secondary | ICD-10-CM | POA: Diagnosis present

## 2022-10-02 DIAGNOSIS — N170 Acute kidney failure with tubular necrosis: Secondary | ICD-10-CM | POA: Diagnosis not present

## 2022-10-02 DIAGNOSIS — I2585 Chronic coronary microvascular dysfunction: Secondary | ICD-10-CM | POA: Diagnosis not present

## 2022-10-02 DIAGNOSIS — I2510 Atherosclerotic heart disease of native coronary artery without angina pectoris: Secondary | ICD-10-CM | POA: Diagnosis present

## 2022-10-02 DIAGNOSIS — M7732 Calcaneal spur, left foot: Secondary | ICD-10-CM | POA: Diagnosis not present

## 2022-10-02 DIAGNOSIS — Z951 Presence of aortocoronary bypass graft: Secondary | ICD-10-CM | POA: Diagnosis not present

## 2022-10-02 DIAGNOSIS — I25110 Atherosclerotic heart disease of native coronary artery with unstable angina pectoris: Secondary | ICD-10-CM | POA: Diagnosis present

## 2022-10-02 DIAGNOSIS — E1152 Type 2 diabetes mellitus with diabetic peripheral angiopathy with gangrene: Secondary | ICD-10-CM | POA: Diagnosis not present

## 2022-10-02 DIAGNOSIS — F419 Anxiety disorder, unspecified: Secondary | ICD-10-CM | POA: Diagnosis present

## 2022-10-02 DIAGNOSIS — K219 Gastro-esophageal reflux disease without esophagitis: Secondary | ICD-10-CM | POA: Diagnosis present

## 2022-10-02 DIAGNOSIS — Z955 Presence of coronary angioplasty implant and graft: Secondary | ICD-10-CM

## 2022-10-02 DIAGNOSIS — I152 Hypertension secondary to endocrine disorders: Secondary | ICD-10-CM | POA: Diagnosis present

## 2022-10-02 DIAGNOSIS — Z79899 Other long term (current) drug therapy: Secondary | ICD-10-CM

## 2022-10-02 DIAGNOSIS — Z7982 Long term (current) use of aspirin: Secondary | ICD-10-CM

## 2022-10-02 DIAGNOSIS — I70262 Atherosclerosis of native arteries of extremities with gangrene, left leg: Secondary | ICD-10-CM | POA: Diagnosis present

## 2022-10-02 DIAGNOSIS — Z01810 Encounter for preprocedural cardiovascular examination: Secondary | ICD-10-CM | POA: Diagnosis not present

## 2022-10-02 DIAGNOSIS — N2581 Secondary hyperparathyroidism of renal origin: Secondary | ICD-10-CM | POA: Diagnosis not present

## 2022-10-02 DIAGNOSIS — Z9889 Other specified postprocedural states: Secondary | ICD-10-CM | POA: Diagnosis not present

## 2022-10-02 DIAGNOSIS — D62 Acute posthemorrhagic anemia: Secondary | ICD-10-CM | POA: Diagnosis not present

## 2022-10-02 DIAGNOSIS — R9439 Abnormal result of other cardiovascular function study: Secondary | ICD-10-CM

## 2022-10-02 DIAGNOSIS — Z87442 Personal history of urinary calculi: Secondary | ICD-10-CM

## 2022-10-02 DIAGNOSIS — Z885 Allergy status to narcotic agent status: Secondary | ICD-10-CM

## 2022-10-02 DIAGNOSIS — I200 Unstable angina: Principal | ICD-10-CM

## 2022-10-02 DIAGNOSIS — E1165 Type 2 diabetes mellitus with hyperglycemia: Secondary | ICD-10-CM | POA: Diagnosis not present

## 2022-10-02 DIAGNOSIS — E1122 Type 2 diabetes mellitus with diabetic chronic kidney disease: Secondary | ICD-10-CM | POA: Diagnosis not present

## 2022-10-02 DIAGNOSIS — Z888 Allergy status to other drugs, medicaments and biological substances status: Secondary | ICD-10-CM

## 2022-10-02 DIAGNOSIS — I493 Ventricular premature depolarization: Secondary | ICD-10-CM | POA: Diagnosis present

## 2022-10-02 DIAGNOSIS — F32A Depression, unspecified: Secondary | ICD-10-CM | POA: Diagnosis present

## 2022-10-02 DIAGNOSIS — E559 Vitamin D deficiency, unspecified: Secondary | ICD-10-CM | POA: Diagnosis present

## 2022-10-02 DIAGNOSIS — J9811 Atelectasis: Secondary | ICD-10-CM | POA: Diagnosis not present

## 2022-10-02 DIAGNOSIS — E876 Hypokalemia: Secondary | ICD-10-CM | POA: Diagnosis present

## 2022-10-02 DIAGNOSIS — R197 Diarrhea, unspecified: Secondary | ICD-10-CM | POA: Diagnosis not present

## 2022-10-02 DIAGNOSIS — I25119 Atherosclerotic heart disease of native coronary artery with unspecified angina pectoris: Secondary | ICD-10-CM | POA: Diagnosis not present

## 2022-10-02 DIAGNOSIS — R072 Precordial pain: Secondary | ICD-10-CM | POA: Insufficient documentation

## 2022-10-02 DIAGNOSIS — R7989 Other specified abnormal findings of blood chemistry: Secondary | ICD-10-CM | POA: Diagnosis not present

## 2022-10-02 DIAGNOSIS — Z4682 Encounter for fitting and adjustment of non-vascular catheter: Secondary | ICD-10-CM | POA: Diagnosis not present

## 2022-10-02 DIAGNOSIS — E118 Type 2 diabetes mellitus with unspecified complications: Secondary | ICD-10-CM | POA: Diagnosis not present

## 2022-10-02 DIAGNOSIS — Z7984 Long term (current) use of oral hypoglycemic drugs: Secondary | ICD-10-CM

## 2022-10-02 DIAGNOSIS — I130 Hypertensive heart and chronic kidney disease with heart failure and stage 1 through stage 4 chronic kidney disease, or unspecified chronic kidney disease: Secondary | ICD-10-CM | POA: Diagnosis not present

## 2022-10-02 DIAGNOSIS — I96 Gangrene, not elsewhere classified: Secondary | ICD-10-CM | POA: Diagnosis not present

## 2022-10-02 DIAGNOSIS — N1831 Chronic kidney disease, stage 3a: Secondary | ICD-10-CM | POA: Diagnosis present

## 2022-10-02 DIAGNOSIS — D472 Monoclonal gammopathy: Secondary | ICD-10-CM | POA: Diagnosis present

## 2022-10-02 DIAGNOSIS — I2582 Chronic total occlusion of coronary artery: Secondary | ICD-10-CM | POA: Diagnosis present

## 2022-10-02 DIAGNOSIS — D631 Anemia in chronic kidney disease: Secondary | ICD-10-CM | POA: Diagnosis present

## 2022-10-02 DIAGNOSIS — I081 Rheumatic disorders of both mitral and tricuspid valves: Secondary | ICD-10-CM | POA: Diagnosis not present

## 2022-10-02 DIAGNOSIS — Z6834 Body mass index (BMI) 34.0-34.9, adult: Secondary | ICD-10-CM

## 2022-10-02 DIAGNOSIS — Z8249 Family history of ischemic heart disease and other diseases of the circulatory system: Secondary | ICD-10-CM

## 2022-10-02 DIAGNOSIS — N189 Chronic kidney disease, unspecified: Secondary | ICD-10-CM | POA: Diagnosis not present

## 2022-10-02 DIAGNOSIS — R000 Tachycardia, unspecified: Secondary | ICD-10-CM | POA: Diagnosis present

## 2022-10-02 DIAGNOSIS — M19072 Primary osteoarthritis, left ankle and foot: Secondary | ICD-10-CM | POA: Diagnosis not present

## 2022-10-02 LAB — COMPREHENSIVE METABOLIC PANEL
ALT: 24 U/L (ref 0–44)
AST: 43 U/L — ABNORMAL HIGH (ref 15–41)
Albumin: 3.8 g/dL (ref 3.5–5.0)
Alkaline Phosphatase: 79 U/L (ref 38–126)
Anion gap: 12 (ref 5–15)
BUN: 32 mg/dL — ABNORMAL HIGH (ref 8–23)
CO2: 28 mmol/L (ref 22–32)
Calcium: 9 mg/dL (ref 8.9–10.3)
Chloride: 95 mmol/L — ABNORMAL LOW (ref 98–111)
Creatinine, Ser: 0.97 mg/dL (ref 0.44–1.00)
GFR, Estimated: >60 mL/min (ref >60)
Glucose, Bld: 271 mg/dL — ABNORMAL HIGH (ref 70–99)
Potassium: 4 mmol/L (ref 3.5–5.1)
Sodium: 135 mmol/L (ref 135–145)
Total Bilirubin: 1.1 mg/dL (ref 0.3–1.2)
Total Protein: 7.6 g/dL (ref 6.5–8.1)

## 2022-10-02 LAB — D-DIMER, QUANTITATIVE: D-Dimer, Quant: 1.03 ug/mL-FEU — ABNORMAL HIGH (ref 0.00–0.50)

## 2022-10-02 LAB — NM PET CT CARDIAC PERFUSION MULTI W/ABSOLUTE BLOODFLOW
MBFR: 1.38
Nuc Rest EF: 68 %
Nuc Stress EF: 63 %
Peak HR: 118 {beats}/min
Rest HR: 90 {beats}/min
Rest MBF: 1.12 ml/g/min
Rest Nuclear Isotope Dose: 21 mCi
Rest perfusion cavity size (mL): 62 mL
ST Depression (mm): 0 mm
Stress MBF: 1.54 ml/g/min
Stress Nuclear Isotope Dose: 20.9 mCi
Stress perfusion cavity size (mL): 72 mL
TID: 1.19

## 2022-10-02 LAB — CBC WITH DIFFERENTIAL/PLATELET
Abs Immature Granulocytes: 0.04 10*3/uL (ref 0.00–0.07)
Basophils Absolute: 0.1 10*3/uL (ref 0.0–0.1)
Basophils Relative: 1 %
Eosinophils Absolute: 0.3 10*3/uL (ref 0.0–0.5)
Eosinophils Relative: 3 %
HCT: 39.1 % (ref 36.0–46.0)
Hemoglobin: 13.2 g/dL (ref 12.0–15.0)
Immature Granulocytes: 0 %
Lymphocytes Relative: 21 %
Lymphs Abs: 2.2 10*3/uL (ref 0.7–4.0)
MCH: 31.4 pg (ref 26.0–34.0)
MCHC: 33.8 g/dL (ref 30.0–36.0)
MCV: 93.1 fL (ref 80.0–100.0)
Monocytes Absolute: 0.8 10*3/uL (ref 0.1–1.0)
Monocytes Relative: 8 %
Neutro Abs: 6.9 10*3/uL (ref 1.7–7.7)
Neutrophils Relative %: 67 %
Platelets: 323 10*3/uL (ref 150–400)
RBC: 4.2 MIL/uL (ref 3.87–5.11)
RDW: 12.9 % (ref 11.5–15.5)
WBC: 10.4 10*3/uL (ref 4.0–10.5)
nRBC: 0 % (ref 0.0–0.2)

## 2022-10-02 LAB — TROPONIN I (HIGH SENSITIVITY)
Troponin I (High Sensitivity): 22 ng/L — ABNORMAL HIGH (ref <18)
Troponin I (High Sensitivity): 22 ng/L — ABNORMAL HIGH (ref <18)

## 2022-10-02 LAB — CBG MONITORING, ED: Glucose-Capillary: 352 mg/dL — ABNORMAL HIGH (ref 70–99)

## 2022-10-02 MED ORDER — RUBIDIUM RB82 GENERATOR (RUBYFILL)
21.0000 | PACK | Freq: Once | INTRAVENOUS | Status: AC
  Administered 2022-10-02: 21 via INTRAVENOUS

## 2022-10-02 MED ORDER — ASPIRIN 325 MG PO TABS
325.0000 mg | ORAL_TABLET | Freq: Once | ORAL | Status: AC
  Administered 2022-10-02: 325 mg via ORAL
  Filled 2022-10-02: qty 1

## 2022-10-02 MED ORDER — ROSUVASTATIN CALCIUM 20 MG PO TABS
20.0000 mg | ORAL_TABLET | Freq: Every day | ORAL | Status: DC
  Administered 2022-10-02 – 2022-10-03 (×2): 20 mg via ORAL
  Filled 2022-10-02 (×2): qty 1

## 2022-10-02 MED ORDER — ASPIRIN 81 MG PO TBEC: 81.0000 mg | DELAYED_RELEASE_TABLET | Freq: Every day | ORAL | Status: DC

## 2022-10-02 MED ORDER — ACETAMINOPHEN 325 MG PO TABS
650.0000 mg | ORAL_TABLET | Freq: Four times a day (QID) | ORAL | Status: DC | PRN
  Administered 2022-10-03: 650 mg via ORAL
  Filled 2022-10-02: qty 2

## 2022-10-02 MED ORDER — ACETAMINOPHEN 650 MG RE SUPP: 650.0000 mg | Freq: Four times a day (QID) | RECTAL | Status: DC | PRN

## 2022-10-02 MED ORDER — REGADENOSON 0.4 MG/5ML IV SOLN: 0.4000 mg | Freq: Once | INTRAVENOUS | Status: AC

## 2022-10-02 MED ORDER — ASPIRIN 325 MG PO TABS
325.0000 mg | ORAL_TABLET | Freq: Every day | ORAL | Status: DC
  Administered 2022-10-02: 325 mg via ORAL
  Filled 2022-10-02: qty 1

## 2022-10-02 MED ORDER — DULOXETINE HCL 30 MG PO CPEP
30.0000 mg | ORAL_CAPSULE | Freq: Every day | ORAL | Status: DC
  Administered 2022-10-02 – 2022-10-18 (×16): 30 mg via ORAL
  Filled 2022-10-02 (×16): qty 1

## 2022-10-02 MED ORDER — INSULIN GLARGINE-YFGN 100 UNIT/ML ~~LOC~~ SOLN
12.0000 [IU] | Freq: Every day | SUBCUTANEOUS | Status: DC
  Administered 2022-10-02 – 2022-10-04 (×3): 12 [IU] via SUBCUTANEOUS
  Filled 2022-10-02 (×4): qty 0.12

## 2022-10-02 MED ORDER — REGADENOSON 0.4 MG/5ML IV SOLN
INTRAVENOUS | Status: AC
  Administered 2022-10-02: 0.4 mg via INTRAVENOUS
  Filled 2022-10-02: qty 5

## 2022-10-02 MED ORDER — INSULIN ASPART 100 UNIT/ML IJ SOLN
0.0000 [IU] | Freq: Three times a day (TID) | INTRAMUSCULAR | Status: DC
  Administered 2022-10-02: 15 [IU] via SUBCUTANEOUS
  Administered 2022-10-03: 8 [IU] via SUBCUTANEOUS
  Administered 2022-10-03: 3 [IU] via SUBCUTANEOUS
  Administered 2022-10-03 – 2022-10-04 (×3): 5 [IU] via SUBCUTANEOUS
  Administered 2022-10-04 – 2022-10-05 (×3): 3 [IU] via SUBCUTANEOUS
  Administered 2022-10-05 (×2): 5 [IU] via SUBCUTANEOUS
  Administered 2022-10-05 – 2022-10-06 (×5): 3 [IU] via SUBCUTANEOUS
  Administered 2022-10-07 (×3): 5 [IU] via SUBCUTANEOUS
  Administered 2022-10-07: 3 [IU] via SUBCUTANEOUS
  Administered 2022-10-08 (×3): 5 [IU] via SUBCUTANEOUS
  Administered 2022-10-08 – 2022-10-09 (×3): 3 [IU] via SUBCUTANEOUS
  Administered 2022-10-09: 8 [IU] via SUBCUTANEOUS
  Administered 2022-10-09: 15 [IU] via SUBCUTANEOUS
  Administered 2022-10-10 (×2): 3 [IU] via SUBCUTANEOUS
  Administered 2022-10-10: 8 [IU] via SUBCUTANEOUS
  Administered 2022-10-10 – 2022-10-11 (×3): 5 [IU] via SUBCUTANEOUS
  Administered 2022-10-11: 8 [IU] via SUBCUTANEOUS
  Administered 2022-10-11: 5 [IU] via SUBCUTANEOUS
  Filled 2022-10-02: qty 0.15

## 2022-10-02 MED ORDER — RUBIDIUM RB82 GENERATOR (RUBYFILL)
20.9000 | PACK | Freq: Once | INTRAVENOUS | Status: AC
  Administered 2022-10-02: 20.9 via INTRAVENOUS

## 2022-10-02 MED ORDER — PANTOPRAZOLE SODIUM 40 MG PO TBEC
40.0000 mg | DELAYED_RELEASE_TABLET | Freq: Every day | ORAL | Status: DC
  Administered 2022-10-02 – 2022-10-11 (×10): 40 mg via ORAL
  Filled 2022-10-02 (×11): qty 1

## 2022-10-02 MED ORDER — ONDANSETRON HCL 4 MG/2ML IJ SOLN: 4.0000 mg | Freq: Four times a day (QID) | INTRAMUSCULAR | Status: DC | PRN

## 2022-10-02 MED ORDER — ACETAMINOPHEN 500 MG PO TABS
1000.0000 mg | ORAL_TABLET | Freq: Once | ORAL | Status: AC
  Administered 2022-10-02: 1000 mg via ORAL
  Filled 2022-10-02: qty 2

## 2022-10-02 MED ORDER — POLYETHYLENE GLYCOL 3350 17 G PO PACK: 17.0000 g | PACK | Freq: Every day | ORAL | Status: DC | PRN

## 2022-10-02 MED ORDER — HYDRALAZINE HCL 20 MG/ML IJ SOLN: 10.0000 mg | Freq: Four times a day (QID) | INTRAMUSCULAR | Status: DC | PRN

## 2022-10-02 MED ORDER — NITROGLYCERIN IN D5W 200-5 MCG/ML-% IV SOLN
0.0000 ug/min | INTRAVENOUS | Status: DC
  Administered 2022-10-02: 5 ug/min via INTRAVENOUS
  Administered 2022-10-03: 50 ug/min via INTRAVENOUS
  Administered 2022-10-04: 60 ug/min via INTRAVENOUS
  Administered 2022-10-04: 65 ug/min via INTRAVENOUS
  Administered 2022-10-05: 90 ug/min via INTRAVENOUS
  Filled 2022-10-02 (×5): qty 250

## 2022-10-02 MED ORDER — METOPROLOL TARTRATE 50 MG PO TABS
50.0000 mg | ORAL_TABLET | Freq: Two times a day (BID) | ORAL | Status: DC
  Administered 2022-10-02 – 2022-10-10 (×16): 50 mg via ORAL
  Filled 2022-10-02 (×9): qty 1
  Filled 2022-10-02: qty 2
  Filled 2022-10-02 (×3): qty 1
  Filled 2022-10-02: qty 2
  Filled 2022-10-02 (×2): qty 1

## 2022-10-02 MED ORDER — ONDANSETRON HCL 4 MG PO TABS
4.0000 mg | ORAL_TABLET | Freq: Four times a day (QID) | ORAL | Status: DC | PRN
  Administered 2022-10-10: 4 mg via ORAL
  Filled 2022-10-02: qty 1

## 2022-10-02 MED ORDER — IOHEXOL 350 MG/ML SOLN
75.0000 mL | Freq: Once | INTRAVENOUS | Status: AC | PRN
  Administered 2022-10-02: 75 mL via INTRAVENOUS

## 2022-10-02 NOTE — Assessment & Plan Note (Signed)
.   Patient been placed on Accu-Cheks before every meal and nightly with sliding scale insulin . Placing patient on home regimen of basal insulin therapy. . Hemoglobin A1C ordered . Diabetic Diet after diet is resumed

## 2022-10-02 NOTE — ED Notes (Signed)
Pt given [place] sandwich and apple sauce, water as well, pt updated on NPO status at midnight, pt verbalized understanding

## 2022-10-02 NOTE — ED Notes (Signed)
Dr. Cyd Silence notified of pt ambulated to BR, c/o dyspnea while ambulating and HR was 130 ST on the monitor when she return to [place]

## 2022-10-02 NOTE — Assessment & Plan Note (Signed)
.   Known history of coronary artery disease with PCI and stenting performed and 2013 . Patient follows with Dr. Percival Spanish with cardiology

## 2022-10-02 NOTE — ED Notes (Signed)
Per the MDYao patient can eat up to midnight

## 2022-10-02 NOTE — Assessment & Plan Note (Signed)
Strict intake and output monitoring Creatinine near baseline Minimizing nephrotoxic agents as much as possible Serial chemistries to monitor renal function and electrolytes  

## 2022-10-02 NOTE — ED Notes (Signed)
Pt to CT scanner at this time 

## 2022-10-02 NOTE — ED Provider Notes (Signed)
[facility] DEPT Provider Note   CSN: [id number] Arrival date & time: 10/02/22  1504     History  Chief Complaint  Patient presents with   Chest Pain    Charice Purewal is a 66 y.o. female history of CAD status post stents, hypertension, here presenting with chest pain.  Patient has been having chest pain and pressure for the last 3 to 4 days.  Patient states that she is not very active usually but she has been having chest pressure at rest.  Patient had a cardiac stress test this afternoon that showed possible triple-vessel disease and inferior lateral ischemia.  Given symptoms consistent with unstable angina, patient was brought to the ED for evaluation.   The history is provided by the patient.       Home Medications Prior to Admission medications   Medication Sig Start Date End Date Taking? Authorizing Provider  acetaminophen (TYLENOL) 500 MG tablet Take 500 mg by mouth every 4 (four) hours as needed for moderate pain or mild pain.   Yes [provider]  aspirin EC 81 MG tablet Take 81 mg by mouth daily. Swallow whole.   Yes [provider]  Cholecalciferol (VITAMIN D3) 1.25 MG (50000 UT) TABS Take 5,000 Units by mouth.   Yes [provider]  DULoxetine (CYMBALTA) 30 MG capsule Take 1 capsule (30 mg total) by mouth daily. 09/30/21 10/02/22 Yes Smoot, Sarah A, PA-C  glimepiride (AMARYL) 2 MG tablet Take 2 mg by mouth 3 (three) times daily. 04/06/22  Yes [provider]  LANTUS SOLOSTAR 100 UNIT/ML Solostar Pen Inject into the skin. 07/16/22  Yes [provider]  losartan-hydrochlorothiazide (HYZAAR) 100-25 MG tablet Take 1 tablet by mouth daily. 08/22/22  Yes Minus Breeding, MD  metoprolol (LOPRESSOR) 50 MG tablet Take 50 mg by mouth 2 (two) times daily.   Yes [provider]  omeprazole (PRILOSEC) 20 MG capsule Take 20 mg by mouth daily.   Yes [provider]  Pyridoxine HCl (VITAMIN B6 PO)  Take 1 capsule by mouth daily.   Yes [provider]  rosuvastatin (CRESTOR) 20 MG tablet Take 1 tablet (20 mg total) by mouth daily. 08/22/22  Yes Minus Breeding, MD      Allergies    Dapagliflozin and Codeine    Review of Systems   Review of Systems  Cardiovascular:  Positive for chest pain.  All other systems reviewed and are negative.   Physical Exam Updated Vital Signs BP (!) 144/79   Pulse 99   Temp 98.6 F (37 C) (Oral)   Resp (!) 22   SpO2 94%  Physical Exam Vitals and nursing note reviewed.  Constitutional:      Comments: Slightly uncomfortable  HENT:     Head: Normocephalic.     Nose: Nose normal.     Mouth/Throat:     Mouth: Mucous membranes are moist.  Eyes:     Extraocular Movements: Extraocular movements intact.     Pupils: Pupils are equal, round, and reactive to light.  Cardiovascular:     Rate and Rhythm: Normal rate and regular rhythm.     Pulses: Normal pulses.     Heart sounds: Normal heart sounds.  Pulmonary:     Effort: Pulmonary effort is normal.     Breath sounds: Normal breath sounds.  Abdominal:     General: Abdomen is flat.     Palpations: Abdomen is soft.  Musculoskeletal:        General:  Normal range of motion.     Cervical back: Normal range of motion and neck supple.  Skin:    General: Skin is warm.     Capillary Refill: Capillary refill takes less than 2 seconds.  Neurological:     General: No focal deficit present.     Mental Status: She is oriented to person, place, and time.  Psychiatric:        Mood and Affect: Mood normal.        Behavior: Behavior normal.     ED Results / Procedures / Treatments   Labs (all labs ordered are listed, but only abnormal results are displayed) Labs Reviewed  COMPREHENSIVE METABOLIC PANEL - Abnormal; Notable for the following components:      Result Value   Chloride 95 (*)    Glucose, Bld 271 (*)    BUN 32 (*)    AST 43 (*)    All other components within normal limits   TROPONIN I (HIGH SENSITIVITY) - Abnormal; Notable for the following components:   Troponin I (High Sensitivity) 22 (*)    All other components within normal limits  CBC WITH DIFFERENTIAL/PLATELET  TROPONIN I (HIGH SENSITIVITY)    EKG EKG Interpretation  Date/Time:  Tuesday October 02 2022 15:19:07 EDT Ventricular Rate:  95 PR Interval:    QRS Duration: 85 QT Interval:  376 QTC Calculation: 473 R Axis:   21 Text Interpretation: Normal sinus rhythm Anterior infarct, old Repol abnrm suggests ischemia, lateral leads No significant change since last tracing Confirmed by Wandra Arthurs [phone]) on 10/02/2022 4:03:20 PM  Radiology DG Chest Port 1 View  Result Date: 10/02/2022 CLINICAL DATA:  Chest pain on and off for a while. EXAM: PORTABLE CHEST 1 VIEW COMPARISON:  05/27/2021. FINDINGS: Cardiac silhouette is normal in size and configuration. No mediastinal or hilar masses. Clear lungs.  No pleural effusion or pneumothorax. Skeletal structures are grossly intact. IMPRESSION: No active disease. Electronically Signed   By: Lajean Manes M.D.   On: 10/02/2022 15:50   NM PET CT CARDIAC PERFUSION MULTI W/ABSOLUTE BLOODFLOW  Result Date: 10/02/2022   Small, reversible, moderate defect present in the basal inferolateral segment consistent with ischemia. EF drops with stress (68->63%). TID is present (1.19). Severely reduced coronary flow reserve (1.38). Findings are concerning for either balanced ischemia in the setting of 3-vessel CAD versus microvascular dysfunction. The patient reported chest pain on and off for the past 4 days occuring at rest. She was directed to the ER for work-up of possible unstable angina.   LV perfusion is abnormal. There is evidence of ischemia. There is no evidence of infarction. Defect 1: There is a small defect with moderate reduction in uptake present in the basal inferolateral location(s) that is reversible. There is normal wall motion in the defect area. Consistent with  ischemia.   Rest left ventricular function is normal. Rest EF: 68 %. Stress left ventricular function is normal. Stress EF: 63 %. End diastolic cavity size is normal.   Myocardial blood flow was computed to be 1.23m/g/min at rest and 1.555mg/min at stress. Global myocardial blood flow reserve was 1.38 and was highly abnormal.   Coronary calcium was absent on the attenuation correction CT images.   Findings are consistent with ischemia. The study is high risk. Electronically signed by WeEleonore ChiquitoMD ___________________________________________________________________________ ________ EXJasmine DecemberOVER-READ INTERPRETATION  PET-CT CHEST The following report is an over-read performed by radiologist Dr. [facility] on 10/02/2022. This over-read does  not include interpretation of cardiac or coronary anatomy or pathology. The cardiac PET and cardiac CT interpretation by the cardiologist is to be attached. COMPARISON:  None. FINDINGS: No evidence for lymphadenopathy within the visualized mediastinum or hilar regions. The visualized lung parenchyma shows no suspicious pulmonary nodule or mass. No focal airspace consolidation. No effusion. Visualized portions of the upper abdomen are unremarkable. No suspicious lytic or sclerotic osseous abnormality. IMPRESSION: No acute or clinically significant extracardiac findings. Electronically Signed   By: Misty Stanley M.D.   On: 10/02/2022 15:17   Procedures Procedures    CRITICAL CARE Performed by: Wandra Arthurs   Total critical care time: 30 minutes  Critical care time was exclusive of separately billable procedures and treating other patients.  Critical care was necessary to treat or prevent imminent or life-threatening deterioration.  Critical care was time spent personally by me on the following activities: development of treatment plan with patient and/or surrogate as well as nursing, discussions with consultants, evaluation of patient's  response to treatment, examination of patient, obtaining history from patient or surrogate, ordering and performing treatments and interventions, ordering and review of laboratory studies, ordering and review of radiographic studies, pulse oximetry and re-evaluation of patient's condition.  Echo result from today   Small, reversible, moderate defect present in the basal inferolateral segment consistent with ischemia. EF drops with stress (68->63%). TID is present (1.19). Severely reduced coronary flow reserve (1.38). Findings are concerning for either balanced ischemia in the setting of 3-vessel CAD versus microvascular dysfunction. The patient reported chest pain on and off for the past 4 days occuring at rest. She was directed to the ER for work-up of possible unstable angina.   LV perfusion is abnormal. There is evidence of ischemia. There is no evidence of infarction. Defect 1: There is a small defect with moderate reduction in uptake present in the basal inferolateral location(s) that is reversible. There is normal wall motion in the defect area. Consistent with ischemia.   Rest left ventricular function is normal. Rest EF: 68 %. Stress left ventricular function is normal. Stress EF: 63 %. End diastolic cavity size is normal.   Myocardial blood flow was computed to be 1.52m/g/min at rest and 1.541mg/min at stress. Global myocardial blood flow reserve was 1.38 and was highly abnormal.   Coronary calcium was absent on the attenuation correction CT images.   Findings are consistent with ischemia. The study is high risk.   Electronically signed by WeEleonore ChiquitoMD   Medications Ordered in ED Medications  nitroGLYCERIN 50 mg in dextrose 5 % 250 mL (0.2 mg/mL) infusion (20 mcg/min Intravenous Rate/Dose Change 10/02/22 1636)  aspirin tablet 325 mg (325 mg Oral Given 10/02/22 1552)  acetaminophen (TYLENOL) tablet 1,000 mg (1,000 mg Oral Given 10/02/22 1552)    ED Course/ Medical Decision Making/  A&P                           Medical Decision Making Ermine BoYzaguirres a 89.o. female here presenting with chest pain.  Patient has chest pain for the last several days and has abnormal stress test.  I discussed with Dr. O'Davina Pokerom cardiology.  He states that patient will need to be admitted and will need troponin and likely will need a cardiac catheterization.  We will get troponin and consult cardiology.  Patient's blood pressure is 170 and still has chest pain so we will give aspirin and started on nitro  drip.  4:42 PM Patient troponin is 22.  I discussed case with Trish from cardiology.  They agreed with IV nitro drip.  Since patient's troponin is only minimally elevated, will hold off on heparin. They state that cardiology plans to see patient tomorrow and patient can be admitted to the hospital service and transferred to [facility] for unstable angina.  We will keep n.p.o. after midnight in case patient needs a cath in the morning.  Problems Addressed: Unstable angina [facility]): acute illness or injury  Amount and/or Complexity of Data Reviewed Labs: ordered. Decision-making details documented in ED Course. Radiology: ordered and independent interpretation performed. Decision-making details documented in ED Course. ECG/medicine tests: ordered and independent interpretation performed. Decision-making details documented in ED Course.  Risk OTC drugs. Prescription drug management. Decision regarding hospitalization.    Final Clinical Impression(s) / ED Diagnoses Final diagnoses:  None    Rx / DC Orders ED Discharge Orders     None         Drenda Freeze, MD 10/02/22 1702

## 2022-10-02 NOTE — Assessment & Plan Note (Signed)
·   Please see assessment and plan above °

## 2022-10-02 NOTE — ED Triage Notes (Signed)
Pt arriving from IR with chest pain. Pt reports the pain began after receiving med in IR and it felt like an elephant was sitting on her chest. Currently rating pain 3/10. Pt also reports she is feeling short of breath.

## 2022-10-02 NOTE — Assessment & Plan Note (Signed)
No clinical evidence of cardiogenic volume overload Strict input and output monitoring Daily weights Low-sodium diet  

## 2022-10-02 NOTE — Assessment & Plan Note (Signed)
Continuing home regimen of daily PPI therapy.  

## 2022-10-02 NOTE — Assessment & Plan Note (Signed)
   Continue home regimen of metoprolol  As needed intravenous hydralazine for markedly elevated blood pressure

## 2022-10-02 NOTE — ED Notes (Signed)
This RN gave update to [place] (daughter-in-law, pt gave verbal permission to speak with [name]) advised could give discus testing results as that will need to be interpreted by provider, [name] verbalized understanding. Otherwise gave update on TOC and transfer to [facility]< unsure of time at this time, and that pt was doing well. [name] thankful for update  Secure message sent to Dr. Odessa Fleming, advised daughter-in-law would like a call to discuss TOC and result regarding pt

## 2022-10-02 NOTE — Assessment & Plan Note (Signed)
   Patient presenting with 4-day history of near constant chest discomfort with a preceding 1 year history of intermittent chest discomfort with exertion  While presentation is quite atypical and reproducible on palpation, this could very well be unstable angina considering the progression of symptoms  Cardiac perfusion imaging earlier today abnormal with evidence of reversible ischemia  EDP discussed case with cardiology APP Sharyne Peach and Dr. Radford Pax, heparin infusion was not typically recommended.  Continuing nitroglycerin infusion initiated by APP which can be titrated for chest pain relief  Cycling cardiac enzymes  Monitoring patient on telemetry  Admitting [facility] to [facility]  N.p.o. after midnight  Cardiology stated they would evaluate the patient in the morning in consultation for consideration of cardiac catheterization

## 2022-10-02 NOTE — ED Notes (Signed)
Dr. Cyd Silence notified of EKG completed, HR 125, no verbal orders at this time, pt reports dull ache 2/10 which is unchanged, pt resting with eyes closed, NAD noted, pt reports no needs or concerns at this time  Dr. Cyd Silence did speak with family to provide update via phone.

## 2022-10-02 NOTE — ED Notes (Signed)
Pt given peanut butter, slatine and graham crackers

## 2022-10-02 NOTE — ED Notes (Signed)
Pt ambulated to BR w/one assist, steady gait noted 

## 2022-10-02 NOTE — Progress Notes (Signed)
Pt had ST depression in several leads during study. At the end of the study  when asked how she felt seh stated" I am having chest pain like something is sitting on my chest." "I have been having this same pain off and on for the past 3 or 4 days" Dr Jenetta DownerNori Riis was notified and per MD orders pt was taken to the ED for further evaluation. VS are in flowsheets

## 2022-10-02 NOTE — H&P (Signed)
History and Physical    Patient: Amanda Mendez MRN: [id number] DOA: 10/02/2022  Date of Service: the patient was seen and examined on 10/02/2022  Patient coming from: [facility] via EMS  Chief Complaint:  Chief Complaint  Patient presents with   Chest Pain    HPI:   66 year old female with past medical history of non-insulin-dependent diabetes mellitus type 2, gastroesophageal reflux disease, coronary artery disease (S/P cath with stenting 2013), hypertension, diastolic congestive heart failure (Echo 2015 EF 65-70% with G1DD), chronic kidney disease stage IIIa, anemia of chronic disease, secondary hyperparathyroidism, vitamin D deficiency, MGUS who presents to [facility] emergency department with complaints of chest pain.  Patient explains that approximately 4 days ago she began to experience chest discomfort.  Patient describes this chest discomfort as aching in quality, moderate in intensity, located in the left anterior chest.  Pain is nonradiating, is not worsened with exertion and is associated with some shortness of breath.  Upon further questioning patient denies associated fever, cough, leg swelling, palpitations, sick contacts or contact with confirmed COVID-19 infection.  Patient symptoms continue to persist over the following 4 days.  Earlier in the day on 10/24 the patient underwent PET cardiac nuclear imaging.  During and after the test was performed patient began to experience worsening chest discomfort prompting her to be sent to [facility] emergency department for further evaluation.  Upon evaluation in the emergency department patient was found to have slightly elevated troponin of 22 with EKG revealing no ST segment change.  EDP discussed case with [employer] APP who discussed the case with Dr. Radford Pax.  They recommended hospitalization overnight at [facility] with cardiology consult in the morning.  The patient was initiated on a nitroglycerin  infusion with some improvement in chest discomfort.  The hospitalist group was then called to assess the patient for admission to the hospital.  Review of Systems: Review of Systems  Respiratory:  Positive for shortness of breath.   Cardiovascular:  Positive for chest pain.  All other systems reviewed and are negative.    Past Medical History:  Diagnosis Date   Anginal pain ([place])    Coronary artery disease    angioplasty 9/13   Exertional dyspnea    "usually; today it was with nothing" (11/03/2012)   GERD (gastroesophageal reflux disease)    Hypercholesterolemia with hypertriglyceridemia    Hypertension    Kidney stones    "I've had them 5 times; lithotripsy 1st time; flushed out  all the others" (11/03/2012)   Obesity (BMI 30-39.9)    Pneumonia 2012   Sinus headache    "weekly" (11/03/2012)   Type II diabetes mellitus ([place]) 2007    Past Surgical History:  Procedure Laterality Date   ABDOMINAL AORTOGRAM W/LOWER EXTREMITY Left 09/12/2021   Procedure: ABDOMINAL AORTOGRAM W/LOWER EXTREMITY;  Surgeon: Serafina Mitchell, MD;  Location: [place] CV LAB;  Service: Cardiovascular;  Laterality: Left;   CARDIAC CATHETERIZATION  11/25/29013   CORONARY ANGIOPLASTY WITH STENT PLACEMENT  08/2012   "1"   HEMATOMA EVACUATION  09/22/2012   Procedure: EVACUATION HEMATOMA;  Surgeon: Wynonia Sours, MD;  Location: [place];  Service: Orthopedics;  Laterality: Right;  Evacuation Hematoma right hand   LEFT AND RIGHT HEART CATHETERIZATION WITH CORONARY ANGIOGRAM N/A 08/14/2012   Procedure: LEFT AND RIGHT HEART CATHETERIZATION WITH CORONARY ANGIOGRAM;  Surgeon: Minus Breeding, MD;  Location: [facility] CATH LAB;  Service: Cardiovascular;  Laterality: N/A;   LEFT HEART CATHETERIZATION  WITH CORONARY ANGIOGRAM N/A 11/03/2012   Procedure: LEFT HEART CATHETERIZATION WITH CORONARY ANGIOGRAM;  Surgeon: Jolaine Artist, MD;  Location: [facility] CATH LAB;  Service: Cardiovascular;  Laterality: N/A;    LITHOTRIPSY  1990's   PERCUTANEOUS CORONARY STENT INTERVENTION (PCI-S)  08/14/2012   Procedure: PERCUTANEOUS CORONARY STENT INTERVENTION (PCI-S);  Surgeon: Minus Breeding, MD;  Location: [facility] CATH LAB;  Service: Cardiovascular;;   SKIN BIOPSY  2012   "off my back; it was nothing fatal; infection caused it" (11/03/2012)   TUBAL LIGATION  1980    Social History:  reports that she quit smoking about 10 years ago. Her smoking use included cigarettes. She has a 55.50 pack-year smoking history. She has never used smokeless tobacco. She reports that she does not drink alcohol and does not use drugs.  Allergies  Allergen Reactions   Dapagliflozin Itching   Codeine Nausea And Vomiting    But has tolerated morphine    Family History  Problem Relation Age of Onset   Heart failure Mother        Died in her 83s, had angina starting in her 4s   Crohn's disease Brother     Prior to Admission medications   Medication Sig Start Date End Date Taking? Authorizing Provider  acetaminophen (TYLENOL) 500 MG tablet Take 500 mg by mouth every 4 (four) hours as needed for moderate pain or mild pain.   Yes [provider]  aspirin EC 81 MG tablet Take 81 mg by mouth daily. Swallow whole.   Yes [provider]  Cholecalciferol (VITAMIN D3) 1.25 MG (50000 UT) TABS Take 5,000 Units by mouth.   Yes [provider]  DULoxetine (CYMBALTA) 30 MG capsule Take 1 capsule (30 mg total) by mouth daily. 09/30/21 10/02/22 Yes Smoot, Sarah A, PA-C  glimepiride (AMARYL) 2 MG tablet Take 2 mg by mouth 3 (three) times daily. 04/06/22  Yes [provider]  LANTUS SOLOSTAR 100 UNIT/ML Solostar Pen Inject into the skin. 07/16/22  Yes [provider]  losartan-hydrochlorothiazide (HYZAAR) 100-25 MG tablet Take 1 tablet by mouth daily. 08/22/22  Yes Minus Breeding, MD  metoprolol (LOPRESSOR) 50 MG tablet Take 50 mg by mouth 2 (two) times daily.   Yes [provider]  omeprazole  (PRILOSEC) 20 MG capsule Take 20 mg by mouth daily.   Yes [provider]  Pyridoxine HCl (VITAMIN B6 PO) Take 1 capsule by mouth daily.   Yes [provider]  rosuvastatin (CRESTOR) 20 MG tablet Take 1 tablet (20 mg total) by mouth daily. 08/22/22  Yes Minus Breeding, MD    Physical Exam:  Vitals:   10/02/22 2015 10/02/22 2020 10/02/22 2025 10/02/22 2115  BP: 131/71 139/67 118/75 131/81  Pulse: (!) 118 (!) 120 (!) 124 (!) 125  Resp: 19 (!) 22 19 (!) 21  Temp:      TempSrc:      SpO2: 94% 94% 96% 92%    Constitutional: Awake alert and oriented x3, no associated distress.   Skin: no rashes, no lesions, good skin turgor noted. Eyes: Pupils are equally reactive to light.  No evidence of scleral icterus or conjunctival pallor.  ENMT: Moist mucous membranes noted.  Posterior pharynx clear of any exudate or lesions.   Neck: normal, supple, no masses, no thyromegaly.  No evidence of jugular venous distension.   Respiratory: clear to auscultation bilaterally, no wheezing, no crackles. Normal respiratory effort. No accessory muscle use.  Cardiovascular: Regular rate and rhythm, no murmurs / rubs /  gallops. No extremity edema. 2+ pedal pulses. No carotid bruits.  Chest: Notable reproducible anterior chest wall discomfort without any evidence of crepitus or deformity. Back:   Nontender without crepitus or deformity. Abdomen: Abdomen is soft and nontender.  No evidence of intra-abdominal masses.  Positive bowel sounds noted in all quadrants.   Musculoskeletal: No joint deformity upper and lower extremities. Good ROM, no contractures. Normal muscle tone.  Neurologic: CN 2-12 grossly intact. Sensation intact.  Patient moving all 4 extremities spontaneously.  Patient is following all commands.  Patient is responsive to verbal stimuli.   Psychiatric: Patient exhibits normal mood with appropriate affect.  Patient seems to possess insight as to their current situation.    Data  Reviewed:  I have personally reviewed and interpreted labs, imaging.  Significant findings are   Lab Results  Component Value Date   WBC 10.4 10/02/2022   HGB 13.2 10/02/2022   HCT 39.1 10/02/2022   MCV 93.1 10/02/2022   PLT 323 10/02/2022   Lab Results  Component Value Date   K 4.0 10/02/2022   Lab Results  Component Value Date   BUN 32 (H) 10/02/2022   Lab Results  Component Value Date   CREATININE 0.97 10/02/2022    CXR:  Chest X-ray was personally reviewed.  No evidence of focal infiltrates.  No evidence of pleural effusion.  No evidence of pneumothorax.    EKG: Personally reviewed.  Rhythm is normal sinus rhythm with heart rate of 95 bpm (disagree with computer read of atrial fibrillation).  No dynamic ST segment changes appreciated.    Assessment and Plan: * Chest pain Patient presenting with 4-day history of near constant chest discomfort with a preceding 1 year history of intermittent chest discomfort with exertion While presentation is quite atypical and reproducible on palpation, this could very well be unstable angina considering the progression of symptoms Cardiac perfusion imaging earlier today abnormal with evidence of reversible ischemia EDP discussed case with cardiology APP Sharyne Peach and Dr. Radford Pax, heparin infusion was not typically recommended. Continuing nitroglycerin infusion initiated by APP which can be titrated for chest pain relief Cycling cardiac enzymes Monitoring patient on telemetry Admitting patient to [facility] progressive bed N.p.o. after midnight Cardiology stated they would evaluate the patient in the morning in consultation for consideration of cardiac catheterization  Elevated troponin Please see assessment and plan above  Chronic diastolic HF (heart failure) (HCC) No clinical evidence of cardiogenic volume overload Strict input and output monitoring Daily weights Low-sodium diet   Coronary artery disease involving  native coronary artery of native heart Known history of coronary artery disease with PCI and stenting performed and 2013 Patient follows with Dr. Percival Spanish with cardiology   Chronic kidney disease, stage 3a ([place]) Strict intake and output monitoring Creatinine near baseline Minimizing nephrotoxic agents as much as possible Serial chemistries to monitor renal function and electrolytes   Type 2 diabetes mellitus with stage 3a chronic kidney disease, without long-term current use of insulin ([place]) Patient been placed on Accu-Cheks before every meal and nightly with sliding scale insulin Placing patient on home regimen of basal insulin therapy. Hemoglobin A1C ordered Diabetic Diet after diet is resumed   Essential (primary) hypertension Continue home regimen of metoprolol As needed intravenous hydralazine for markedly elevated blood pressure  Gastroesophageal reflux disease without esophagitis Continuing home regimen of daily PPI therapy.        Code Status:  Full code   Family Communication: Case has been discussed with daughter-in-law via  phone conversation  Consults: EDP discussed case with Sharyne Peach with cardiology who states that cardiology will see patient in consultation in the morning.  Severity of Illness:  The appropriate patient status for this patient is INPATIENT. Inpatient status is judged to be reasonable and necessary in order to provide the required intensity of service to ensure the patient's safety. The patient's presenting symptoms, physical exam findings, and initial radiographic and laboratory data in the context of their chronic comorbidities is felt to place them at high risk for further clinical deterioration. Furthermore, it is not anticipated that the patient will be medically stable for discharge from the hospital within 2 midnights of admission.   * I certify that at the point of admission it is my clinical judgment that the patient will require  inpatient hospital care spanning beyond 2 midnights from the point of admission due to high intensity of service, high risk for further deterioration and high frequency of surveillance required.*  Author:  Vernelle Emerald MD  10/02/2022 9:28 PM

## 2022-10-02 NOTE — ED Notes (Signed)
Daughter in law: [phone] wants to be called with an update

## 2022-10-02 NOTE — Hospital Course (Addendum)
66 year old female with past medical history of non insulin dependent diabetes mellitus type 2, GERD, coronary artery disease, hypertension, diastolic congestive heart failure (Echo 2015 EF 65-70% with G1DD), chronic kidney disease stage IIIa, anemia of chronic disease, secondary hyperparathyroidism, vitamin D deficiency, MGUS who presents to [facility] emergency department with complaints of chest pain.Chest pain 3-4 days.    Cards ordered a stress test as an outpatient that was performed earlier today ([place] nuclear) resulting in worsening chest pain and Troponin of 22.  No ECG change.   EDP D/W Birdie Sons APP who D/W Dr. Radford Pax.  Recommended the patient be hospitalized at [facility] with cardiology to evaluate in consultation in the morning.  Hospitalist group called to assess patient for admission to the hospital.

## 2022-10-03 ENCOUNTER — Encounter (HOSPITAL_COMMUNITY)
Admission: EM | Disposition: A | Discharge status: Home / Self Care | Payer: Self-pay | Source: Home / Self Care | Attending: Family Medicine

## 2022-10-03 ENCOUNTER — Other Ambulatory Visit (HOSPITAL_COMMUNITY): Payer: Self-pay

## 2022-10-03 ENCOUNTER — Inpatient Hospital Stay (HOSPITAL_COMMUNITY): Payer: Medicare Other

## 2022-10-03 DIAGNOSIS — R9439 Abnormal result of other cardiovascular function study: Secondary | ICD-10-CM

## 2022-10-03 DIAGNOSIS — R079 Chest pain, unspecified: Secondary | ICD-10-CM | POA: Diagnosis not present

## 2022-10-03 DIAGNOSIS — I2510 Atherosclerotic heart disease of native coronary artery without angina pectoris: Secondary | ICD-10-CM | POA: Diagnosis not present

## 2022-10-03 DIAGNOSIS — I200 Unstable angina: Secondary | ICD-10-CM

## 2022-10-03 HISTORY — PX: LEFT HEART CATH AND CORONARY ANGIOGRAPHY: CATH118249

## 2022-10-03 LAB — CBC WITH DIFFERENTIAL/PLATELET
Abs Immature Granulocytes: 0.05 10*3/uL (ref 0.00–0.07)
Basophils Absolute: 0.1 10*3/uL (ref 0.0–0.1)
Basophils Relative: 1 %
Eosinophils Absolute: 0.3 10*3/uL (ref 0.0–0.5)
Eosinophils Relative: 3 %
HCT: 35.2 % — ABNORMAL LOW (ref 36.0–46.0)
Hemoglobin: 11.9 g/dL — ABNORMAL LOW (ref 12.0–15.0)
Immature Granulocytes: 1 %
Lymphocytes Relative: 23 %
Lymphs Abs: 2.4 10*3/uL (ref 0.7–4.0)
MCH: 31.6 pg (ref 26.0–34.0)
MCHC: 33.8 g/dL (ref 30.0–36.0)
MCV: 93.4 fL (ref 80.0–100.0)
Monocytes Absolute: 1.1 10*3/uL — ABNORMAL HIGH (ref 0.1–1.0)
Monocytes Relative: 10 %
Neutro Abs: 6.8 10*3/uL (ref 1.7–7.7)
Neutrophils Relative %: 62 %
Platelets: 333 10*3/uL (ref 150–400)
RBC: 3.77 MIL/uL — ABNORMAL LOW (ref 3.87–5.11)
RDW: 13 % (ref 11.5–15.5)
WBC: 10.8 10*3/uL — ABNORMAL HIGH (ref 4.0–10.5)
nRBC: 0 % (ref 0.0–0.2)

## 2022-10-03 LAB — ECHOCARDIOGRAM COMPLETE
AR max vel: 2.19 cm2
AV Area VTI: 2.64 cm2
AV Area mean vel: 2.13 cm2
AV Mean grad: 7 mmHg
AV Peak grad: 13.8 mmHg
Ao pk vel: 1.86 m/s
Area-P 1/2: 5.66 cm2
MV VTI: 4.17 cm2
S' Lateral: 2.4 cm

## 2022-10-03 LAB — COMPREHENSIVE METABOLIC PANEL
ALT: 26 U/L (ref 0–44)
AST: 29 U/L (ref 15–41)
Albumin: 3.3 g/dL — ABNORMAL LOW (ref 3.5–5.0)
Alkaline Phosphatase: 73 U/L (ref 38–126)
Anion gap: 11 (ref 5–15)
BUN: 39 mg/dL — ABNORMAL HIGH (ref 8–23)
CO2: 27 mmol/L (ref 22–32)
Calcium: 9.1 mg/dL (ref 8.9–10.3)
Chloride: 100 mmol/L (ref 98–111)
Creatinine, Ser: 1.5 mg/dL — ABNORMAL HIGH (ref 0.44–1.00)
GFR, Estimated: 38 mL/min — ABNORMAL LOW (ref >60)
Glucose, Bld: 156 mg/dL — ABNORMAL HIGH (ref 70–99)
Potassium: 3.2 mmol/L — ABNORMAL LOW (ref 3.5–5.1)
Sodium: 138 mmol/L (ref 135–145)
Total Bilirubin: 0.6 mg/dL (ref 0.3–1.2)
Total Protein: 6.6 g/dL (ref 6.5–8.1)

## 2022-10-03 LAB — PROTIME-INR
INR: 1 (ref 0.8–1.2)
Prothrombin Time: 12.7 seconds (ref 11.4–15.2)

## 2022-10-03 LAB — CBG MONITORING, ED
Glucose-Capillary: 208 mg/dL — ABNORMAL HIGH (ref 70–99)
Glucose-Capillary: 188 mg/dL — ABNORMAL HIGH (ref 70–99)

## 2022-10-03 LAB — GLUCOSE, CAPILLARY
Glucose-Capillary: 176 mg/dL — ABNORMAL HIGH (ref 70–99)
Glucose-Capillary: 135 mg/dL — ABNORMAL HIGH (ref 70–99)
Glucose-Capillary: 255 mg/dL — ABNORMAL HIGH (ref 70–99)

## 2022-10-03 LAB — HIV ANTIBODY (ROUTINE TESTING W REFLEX): HIV Screen 4th Generation wRfx: NONREACTIVE

## 2022-10-03 LAB — HEMOGLOBIN A1C
Hgb A1c MFr Bld: 11.3 % — ABNORMAL HIGH (ref 4.8–5.6)
Mean Plasma Glucose: 277.61 mg/dL

## 2022-10-03 LAB — TROPONIN I (HIGH SENSITIVITY): Troponin I (High Sensitivity): 21 ng/L — ABNORMAL HIGH (ref <18)

## 2022-10-03 LAB — APTT: aPTT: 23 seconds — ABNORMAL LOW (ref 24–36)

## 2022-10-03 LAB — MAGNESIUM: Magnesium: 1.8 mg/dL (ref 1.7–2.4)

## 2022-10-03 SURGERY — LEFT HEART CATH AND CORONARY ANGIOGRAPHY
Anesthesia: LOCAL

## 2022-10-03 MED ORDER — MIDAZOLAM HCL 2 MG/2ML IJ SOLN
INTRAMUSCULAR | Status: DC | PRN
  Administered 2022-10-03: 1 mg via INTRAVENOUS

## 2022-10-03 MED ORDER — SODIUM CHLORIDE 0.9% FLUSH
3.0000 mL | Freq: Two times a day (BID) | INTRAVENOUS | Status: DC
  Administered 2022-10-03 – 2022-10-04 (×2): 3 mL via INTRAVENOUS

## 2022-10-03 MED ORDER — IOHEXOL 350 MG/ML SOLN
INTRAVENOUS | Status: DC | PRN
  Administered 2022-10-03: 70 mL

## 2022-10-03 MED ORDER — POTASSIUM CHLORIDE CRYS ER 20 MEQ PO TBCR: 40.0000 meq | EXTENDED_RELEASE_TABLET | Freq: Two times a day (BID) | ORAL | Status: DC

## 2022-10-03 MED ORDER — VERAPAMIL HCL 2.5 MG/ML IV SOLN
INTRAVENOUS | Status: AC
  Filled 2022-10-03: qty 2

## 2022-10-03 MED ORDER — LABETALOL HCL 5 MG/ML IV SOLN: 10.0000 mg | INTRAVENOUS | Status: AC | PRN

## 2022-10-03 MED ORDER — VERAPAMIL HCL 2.5 MG/ML IV SOLN
INTRAVENOUS | Status: DC | PRN
  Administered 2022-10-03: 10 mL via INTRA_ARTERIAL

## 2022-10-03 MED ORDER — SODIUM CHLORIDE 0.9% FLUSH
3.0000 mL | Freq: Two times a day (BID) | INTRAVENOUS | Status: DC
  Administered 2022-10-03: 3 mL via INTRAVENOUS

## 2022-10-03 MED ORDER — ASPIRIN 81 MG PO CHEW: 81.0000 mg | CHEWABLE_TABLET | ORAL | Status: DC

## 2022-10-03 MED ORDER — HEPARIN (PORCINE) IN NACL 1000-0.9 UT/500ML-% IV SOLN
INTRAVENOUS | Status: DC | PRN
  Administered 2022-10-03 (×2): 500 mL

## 2022-10-03 MED ORDER — LIDOCAINE HCL (PF) 1 % IJ SOLN
INTRAMUSCULAR | Status: DC | PRN
  Administered 2022-10-03: 2 mL

## 2022-10-03 MED ORDER — SODIUM CHLORIDE 0.9 % IV SOLN: 250.0000 mL | INTRAVENOUS | Status: DC | PRN

## 2022-10-03 MED ORDER — SODIUM CHLORIDE 0.9 % IV SOLN: INTRAVENOUS | Status: AC

## 2022-10-03 MED ORDER — ONDANSETRON HCL 4 MG/2ML IJ SOLN: 4.0000 mg | Freq: Four times a day (QID) | INTRAMUSCULAR | Status: DC | PRN

## 2022-10-03 MED ORDER — SODIUM CHLORIDE 0.9 % WEIGHT BASED INFUSION: 3.0000 mL/kg/h | INTRAVENOUS | Status: DC

## 2022-10-03 MED ORDER — PERFLUTREN LIPID MICROSPHERE
1.0000 mL | INTRAVENOUS | Status: AC | PRN
  Administered 2022-10-03: 6 mL via INTRAVENOUS
  Filled 2022-10-03: qty 10

## 2022-10-03 MED ORDER — ACETAMINOPHEN 325 MG PO TABS
650.0000 mg | ORAL_TABLET | ORAL | Status: DC | PRN
  Administered 2022-10-04 – 2022-10-07 (×4): 650 mg via ORAL
  Filled 2022-10-03 (×9): qty 2

## 2022-10-03 MED ORDER — ENOXAPARIN SODIUM 40 MG/0.4ML IJ SOSY: 40.0000 mg | PREFILLED_SYRINGE | Freq: Every day | INTRAMUSCULAR | Status: DC

## 2022-10-03 MED ORDER — POTASSIUM CHLORIDE CRYS ER 20 MEQ PO TBCR
40.0000 meq | EXTENDED_RELEASE_TABLET | Freq: Two times a day (BID) | ORAL | Status: AC
  Administered 2022-10-03 – 2022-10-04 (×3): 40 meq via ORAL
  Filled 2022-10-03 (×3): qty 2

## 2022-10-03 MED ORDER — SODIUM CHLORIDE 0.9 % IV SOLN: INTRAVENOUS | Status: DC

## 2022-10-03 MED ORDER — MIDAZOLAM HCL 2 MG/2ML IJ SOLN
INTRAMUSCULAR | Status: AC
  Filled 2022-10-03: qty 2

## 2022-10-03 MED ORDER — FENTANYL CITRATE (PF) 100 MCG/2ML IJ SOLN
INTRAMUSCULAR | Status: AC
  Filled 2022-10-03: qty 2

## 2022-10-03 MED ORDER — ASPIRIN 81 MG PO CHEW
81.0000 mg | CHEWABLE_TABLET | Freq: Every day | ORAL | Status: DC
  Administered 2022-10-04 – 2022-10-11 (×8): 81 mg via ORAL
  Filled 2022-10-03 (×9): qty 1

## 2022-10-03 MED ORDER — ASPIRIN 81 MG PO TBEC
81.0000 mg | DELAYED_RELEASE_TABLET | Freq: Every day | ORAL | Status: DC
  Administered 2022-10-03: 81 mg via ORAL
  Filled 2022-10-03: qty 1

## 2022-10-03 MED ORDER — HEPARIN (PORCINE) 25000 UT/250ML-% IV SOLN
750.0000 [IU]/h | INTRAVENOUS | Status: DC
  Administered 2022-10-03: 750 [IU]/h via INTRAVENOUS
  Filled 2022-10-03: qty 250

## 2022-10-03 MED ORDER — LIDOCAINE HCL (PF) 1 % IJ SOLN
INTRAMUSCULAR | Status: AC
  Filled 2022-10-03: qty 30

## 2022-10-03 MED ORDER — ASPIRIN 81 MG PO CHEW
81.0000 mg | CHEWABLE_TABLET | ORAL | Status: AC
  Administered 2022-10-03: 81 mg via ORAL
  Filled 2022-10-03: qty 1

## 2022-10-03 MED ORDER — HEPARIN SODIUM (PORCINE) 1000 UNIT/ML IJ SOLN
INTRAMUSCULAR | Status: DC | PRN
  Administered 2022-10-03: 5000 [IU] via INTRAVENOUS

## 2022-10-03 MED ORDER — HYDRALAZINE HCL 20 MG/ML IJ SOLN: 10.0000 mg | INTRAMUSCULAR | Status: AC | PRN

## 2022-10-03 MED ORDER — HEPARIN (PORCINE) IN NACL 1000-0.9 UT/500ML-% IV SOLN
INTRAVENOUS | Status: AC
  Filled 2022-10-03: qty 1000

## 2022-10-03 MED ORDER — FENTANYL CITRATE (PF) 100 MCG/2ML IJ SOLN
INTRAMUSCULAR | Status: DC | PRN
  Administered 2022-10-03: 25 ug via INTRAVENOUS

## 2022-10-03 MED ORDER — HEPARIN BOLUS VIA INFUSION
3900.0000 [IU] | Freq: Once | INTRAVENOUS | Status: AC
  Administered 2022-10-03: 3900 [IU] via INTRAVENOUS
  Filled 2022-10-03: qty 3900

## 2022-10-03 MED ORDER — SODIUM CHLORIDE 0.9 % WEIGHT BASED INFUSION
80.0000 mL/h | INTRAVENOUS | Status: DC
  Administered 2022-10-03: 1 mL/kg/h via INTRAVENOUS

## 2022-10-03 MED ORDER — HEPARIN SODIUM (PORCINE) 1000 UNIT/ML IJ SOLN
INTRAMUSCULAR | Status: AC
  Filled 2022-10-03: qty 10

## 2022-10-03 MED ORDER — SODIUM CHLORIDE 0.9% FLUSH: 3.0000 mL | INTRAVENOUS | Status: DC | PRN

## 2022-10-03 SURGICAL SUPPLY — 14 items
BAND ZEPHYR COMPRESS 30 LONG (HEMOSTASIS) IMPLANT
CATH 5FR PIGTAIL DIAGNOSTIC (CATHETERS) IMPLANT
CATH DIAG 6FR JR4 (CATHETERS) IMPLANT
CATH INFINITI 5 FR AR1 MOD (CATHETERS) IMPLANT
CATH INFINITI 5FR AL1 (CATHETERS) IMPLANT
CATH OPTITORQUE TIG 4.0 6F (CATHETERS) IMPLANT
CATHETER LAUNCHER 6FR NOTO (CATHETERS) IMPLANT
GLIDESHEATH SLEND SS 6F .021 (SHEATH) IMPLANT
GUIDEWIRE INQWIRE 1.5J.035X260 (WIRE) IMPLANT
INQWIRE 1.5J .035X260CM (WIRE) ×1
KIT HEART LEFT (KITS) ×1 IMPLANT
PACK CARDIAC CATHETERIZATION (CUSTOM PROCEDURE TRAY) ×1 IMPLANT
TRANSDUCER W/STOPCOCK (MISCELLANEOUS) ×1 IMPLANT
TUBING CIL FLEX 10 FLL-RA (TUBING) ×1 IMPLANT

## 2022-10-03 NOTE — Plan of Care (Signed)
Patient has a necrotic left toe. Had palpable DP pulse. Called a consult with vascular surgery to evaluate as well as planning for CT surgery consult as well with MVD.

## 2022-10-03 NOTE — Consult Note (Signed)
[facility]  Patient name: Amanda Mendez MRN: [id number] DOB: 1956/07/12 Sex: female   REQUESTING PROVIDER:    Cardiology   REASON FOR CONSULT:    Necrotic left toe  HISTORY OF PRESENT ILLNESS:   Amanda Mendez is a 66 y.o. female, who I have been asked to evaluate for a necrotic left toe.  The patient has previously undergone angiography by myself on 09/12/2021 for left leg pain.  Findings at that time were single-vessel runoff via the posterior tibial artery.  She did not have any outflow stenosis or disease within the posterior tibial artery.  The patient had a stenosis within her anterior tibial artery however this vessel did not opacify past the ankle.  No intervention was performed.  The patient has a history of coronary artery disease and has undergone PCI in the past.  She had a positive PET scan yesterday and underwent pseudo urgent catheterization today and is being evaluated for CABG.  She takes a statin for hypercholesterolemia.  She is medically managed for hypertension.  She is a poorly controlled diabetic and former smoker.  PAST MEDICAL HISTORY    Past Medical History:  Diagnosis Date   Anginal pain ([place])    Coronary artery disease    angioplasty 9/13   Exertional dyspnea    "usually; today it was with nothing" (11/03/2012)   GERD (gastroesophageal reflux disease)    Hypercholesterolemia with hypertriglyceridemia    Hypertension    Kidney stones    "I've had them 5 times; lithotripsy 1st time; flushed out  all the others" (11/03/2012)   Obesity (BMI 30-39.9)    Pneumonia 2012   Sinus headache    "weekly" (11/03/2012)   Type II diabetes mellitus ([place]) 2007     FAMILY HISTORY   Family History  Problem Relation Age of Onset   Heart failure Mother        Died in her 1s, had angina starting in her 86s   Crohn's disease Brother     SOCIAL HISTORY:   Social History   Socioeconomic  History   Marital status: Married    Spouse name: Not on file   Number of children: Not on file   Years of education: Not on file   Highest education level: Not on file  Occupational History   Not on file  Tobacco Use   Smoking status: Former    Packs/day: 1.50    Years: 37.00    Total pack years: 55.50    Types: Cigarettes    Quit date: 06/11/2012    Years since quitting: 10.3   Smokeless tobacco: Never  Vaping Use   Vaping Use: Never used  Substance and Sexual Activity   Alcohol use: Never    Comment: 11/03/2012 "aien't drank none in ~ 10 yr; then it was just an occasional daquiri on vacation"   Drug use: Never   Sexual activity: Not Currently  Other Topics Concern   Not on file  Social History Narrative   pt living with son, daughter in-law and grandson in single story home with ramped entrance. Pt reports that prior to hospitalization she was using a quad cane for ambulation due to increased LE pain with ambulation.    Pt working as Pharmacist, [employer] with decreasing standing tolerance.    Social Determinants of Health   Financial Resource Strain: Not on file  Food Insecurity: Food Insecurity Present (10/02/2022)   Hunger Vital Sign    Worried  About Running Out of Food in the Last Year: Sometimes true    Ran Out of Food in the Last Year: Never true  Transportation Needs: No Transportation Needs (10/02/2022)   PRAPARE - Hydrologist (Medical): No    Lack of Transportation (Non-Medical): No  Physical Activity: Not on file  Stress: No Stress Concern Present (09/21/2021)   [place]    Feeling of Stress : Only a little  Social Connections: Not on file  Intimate Partner Violence: Not At Risk (10/02/2022)   Humiliation, Afraid, Rape, and Kick questionnaire    Fear of Current or Ex-Partner: No    Emotionally Abused: No    Physically Abused: No    Sexually Abused: No     ALLERGIES:    Allergies  Allergen Reactions   Dapagliflozin Itching   Codeine Nausea And Vomiting    But has tolerated morphine    CURRENT MEDICATIONS:    Current Facility-Administered Medications  Medication Dose Route Frequency Provider Last Rate Last Admin   0.9 %  sodium chloride infusion   Intravenous Continuous Cherene Altes, MD 75 mL/hr at 10/03/22 1943 Rate Verify at 10/03/22 1943   0.9 %  sodium chloride infusion   Intravenous Continuous Early Osmond, MD       0.9 %  sodium chloride infusion  250 mL Intravenous PRN Early Osmond, MD       acetaminophen (TYLENOL) tablet 650 mg  650 mg Oral Q4H PRN Early Osmond, MD       [START ON 10/04/2022] aspirin chewable tablet 81 mg  81 mg Oral Daily Early Osmond, MD       DULoxetine (CYMBALTA) DR capsule 30 mg  30 mg Oral Daily Shalhoub, Sherryll Burger, MD   30 mg at 10/03/22 1303   hydrALAZINE (APRESOLINE) injection 10 mg  10 mg Intravenous Q6H PRN Shalhoub, Sherryll Burger, MD       hydrALAZINE (APRESOLINE) injection 10 mg  10 mg Intravenous Q20 Min PRN Early Osmond, MD       insulin aspart (novoLOG) injection 0-15 Units  0-15 Units Subcutaneous TID AC & HS Shalhoub, Sherryll Burger, MD   3 Units at 10/03/22 1303   insulin glargine-yfgn (SEMGLEE) injection 12 Units  12 Units Subcutaneous Daily Vernelle Emerald, MD   12 Units at 10/03/22 1303   labetalol (NORMODYNE) injection 10 mg  10 mg Intravenous Q10 min PRN Early Osmond, MD       metoprolol tartrate (LOPRESSOR) tablet 50 mg  50 mg Oral BID Vernelle Emerald, MD   50 mg at 10/03/22 1046   nitroGLYCERIN 50 mg in dextrose 5 % 250 mL (0.2 mg/mL) infusion  0-200 mcg/min Intravenous Continuous Vernelle Emerald, MD 15 mL/hr at 10/03/22 1943 50 mcg/min at 10/03/22 1943   ondansetron (ZOFRAN) tablet 4 mg  4 mg Oral Q6H PRN Vernelle Emerald, MD       Or   ondansetron (ZOFRAN) injection 4 mg  4 mg Intravenous Q6H PRN Shalhoub, Sherryll Burger, MD       pantoprazole (PROTONIX) EC  tablet 40 mg  40 mg Oral Daily Shalhoub, Sherryll Burger, MD   40 mg at 10/03/22 1046   polyethylene glycol (MIRALAX / GLYCOLAX) packet 17 g  17 g Oral Daily PRN Shalhoub, Sherryll Burger, MD       potassium chloride SA (KLOR-CON M) CR tablet 40 mEq  40  mEq Oral BID Leanor Kail, PA   40 mEq at 10/03/22 4315   rosuvastatin (CRESTOR) tablet 20 mg  20 mg Oral Daily Shalhoub, Sherryll Burger, MD   20 mg at 10/03/22 1303   sodium chloride flush (NS) 0.9 % injection 3 mL  3 mL Intravenous Q12H Bhagat, Bhavinkumar, PA       sodium chloride flush (NS) 0.9 % injection 3 mL  3 mL Intravenous Q12H Early Osmond, MD       sodium chloride flush (NS) 0.9 % injection 3 mL  3 mL Intravenous PRN Early Osmond, MD        REVIEW OF SYSTEMS:   '[X]'$  denotes positive finding, '[ ]'$  denotes negative finding Cardiac  Comments:  Chest pain or chest pressure: ***   Shortness of breath upon exertion:    Short of breath when lying flat:    Irregular heart rhythm:        Vascular    Pain in calf, thigh, or hip brought on by ambulation:    Pain in feet at night that wakes you up from your sleep:     Blood clot in your veins:    Leg swelling:         Pulmonary    Oxygen at home:    Productive cough:     Wheezing:         Neurologic    Sudden weakness in arms or legs:     Sudden numbness in arms or legs:     Sudden onset of difficulty speaking or slurred speech:    Temporary loss of vision in one eye:     Problems with dizziness:         Gastrointestinal    Blood in stool:      Vomited blood:         Genitourinary    Burning when urinating:     Blood in urine:        Psychiatric    Major depression:         Hematologic    Bleeding problems:    Problems with blood clotting too easily:        Skin    Rashes or ulcers:        Constitutional    Fever or chills:     PHYSICAL EXAM:   Vitals:   10/03/22 1825 10/03/22 1845 10/03/22 1900 10/03/22 1930  BP: 120/76 137/71 124/71 (!) 117/57  Pulse: 86 94 90  92  Resp: '13 20 16 '$ (!) 22  Temp:    97.9 F (36.6 C)  TempSrc:    Oral  SpO2: 99% 93% 96% 96%  Weight:      Height:        GENERAL: The patient is a well-nourished female, in no acute distress. The vital signs are documented above. CARDIAC: There is a regular rate and rhythm.  VASCULAR: *** PULMONARY: Nonlabored respirations ABDOMEN: Soft and non-tender with normal pitched bowel sounds.  MUSCULOSKELETAL: There are no major deformities or cyanosis. NEUROLOGIC: No focal weakness or paresthesias are detected. SKIN: There are no ulcers or rashes noted. PSYCHIATRIC: The patient has a normal affect.  STUDIES:   ***  ASSESSMENT and PLAN   ***   Leia Alf, MD, FACS [facility] [phone] Pager [phone]

## 2022-10-03 NOTE — ED Notes (Signed)
Pt appears to be sleeping, observe even RR and unlabored, NAD noted, side rails up x2 for safety, plan of care ongoing, call light within reach, no further concerns as of present.   

## 2022-10-03 NOTE — Consult Note (Addendum)
Cardiology Consultation   Patient ID: Amanda Mendez MRN: [id number]; DOB: March 01, 1956  Admit date: 10/02/2022 Date of Consult: 10/03/2022  PCP:  [facility] [place] [facility] Cardiologist:  Minus Breeding, MD   {  Patient Profile:   Amanda Mendez is a 66 y.o. female with a hx of CAD s/p stenting to OM in 2013, hypertension, diabetes, hyperlipidemia and intermittent renal insufficiency who is being seen 10/03/2022 for the evaluation of chest pain at the request of Dr. Cyd Silence.  Last cardiac catheterization November 2013 showed patent OM stent and stable 70 to 75% RCA stenosis.  Medical management recommended.  Last echocardiogram in 2015 with preserved LV function and moderate LVH.  History of Present Illness:   Amanda Mendez recently seen by Dr. Percival Spanish in September 2023 with few months history of chest pain concerning for angina.  She was scheduled for PET stress test.  Yesterday during stress test patient had severe substernal chest pressure radiating to left shoulder.  Associated with severe shortness of breath but no nausea or vomiting.  She was sent to ER for further evaluation.  High-sensitivity troponin flat at 22.  CT angio of the chest without pulmonary embolism.  She was placed on IV nitroglycerin with improvement chest pressure.  Currently 2 out of 10 chest pressure.  Renal function 1.5 from normal.  Potassium 3.2.  Hemoglobin 11.9.  Sinus tachycardia on monitor.  Patient reports few months history of, initial exertional chest pressure now occurring at rest.  Associated with shortness of breath and nausea in past.  PET cardiac stress test 10/02/22  Small, reversible, moderate defect present in the basal inferolateral segment consistent with ischemia. EF drops with stress (68->63%). TID is present (1.19). Severely reduced coronary flow reserve (1.38). Findings are concerning for either balanced ischemia in the setting of 3-vessel CAD versus  microvascular dysfunction. The patient reported chest pain on and off for the past 4 days occuring at rest. She was directed to the ER for work-up of possible unstable angina.   LV perfusion is abnormal. There is evidence of ischemia. There is no evidence of infarction. Defect 1: There is a small defect with moderate reduction in uptake present in the basal inferolateral location(s) that is reversible. There is normal wall motion in the defect area. Consistent with ischemia.   Rest left ventricular function is normal. Rest EF: 68 %. Stress left ventricular function is normal. Stress EF: 63 %. End diastolic cavity size is normal.   Myocardial blood flow was computed to be 1.70m/g/min at rest and 1.515mg/min at stress. Global myocardial blood flow reserve was 1.38 and was highly abnormal.   Coronary calcium was absent on the attenuation correction CT images.   Findings are consistent with ischemia. The study is high risk.    Past Medical History:  Diagnosis Date   Anginal pain ([facility]   Coronary artery disease    angioplasty 9/13   Exertional dyspnea    "usually; today it was with nothing" (11/03/2012)   GERD (gastroesophageal reflux disease)    Hypercholesterolemia with hypertriglyceridemia    Hypertension    Kidney stones    "I've had them 5 times; lithotripsy 1st time; flushed out  all the others" (11/03/2012)   Obesity (BMI 30-39.9)    Pneumonia 2012   Sinus headache    "weekly" (11/03/2012)   Type II diabetes mellitus ([facility]    Past Surgical History:  Procedure Laterality Date   ABDOMINAL AORTOGRAM W/LOWER EXTREMITY Left 09/12/2021  Procedure: ABDOMINAL AORTOGRAM W/LOWER EXTREMITY;  Surgeon: Serafina Mitchell, MD;  Location: [facility];  Service: Cardiovascular;  Laterality: Left;   CARDIAC CATHETERIZATION  11/25/29013   CORONARY ANGIOPLASTY WITH STENT PLACEMENT  08/2012   "1"   HEMATOMA EVACUATION  09/22/2012   Procedure: EVACUATION HEMATOMA;  Surgeon: Wynonia Sours,  MD;  Location: [facility];  Service: Orthopedics;  Laterality: Right;  Evacuation Hematoma right hand   LEFT AND RIGHT HEART CATHETERIZATION WITH CORONARY ANGIOGRAM N/A 08/14/2012   Procedure: LEFT AND RIGHT HEART CATHETERIZATION WITH CORONARY ANGIOGRAM;  Surgeon: Minus Breeding, MD;  Location: [facility];  Service: Cardiovascular;  Laterality: N/A;   LEFT HEART CATHETERIZATION WITH CORONARY ANGIOGRAM N/A 11/03/2012   Procedure: LEFT HEART CATHETERIZATION WITH CORONARY ANGIOGRAM;  Surgeon: Jolaine Artist, MD;  Location: [facility] CATH LAB;  Service: Cardiovascular;  Laterality: N/A;   LITHOTRIPSY  1990's   PERCUTANEOUS CORONARY STENT INTERVENTION (PCI-S)  08/14/2012   Procedure: PERCUTANEOUS CORONARY STENT INTERVENTION (PCI-S);  Surgeon: Minus Breeding, MD;  Location: [facility] CATH LAB;  Service: Cardiovascular;;   SKIN BIOPSY  2012   "off my back; it was nothing fatal; infection caused it" (11/03/2012)   TUBAL LIGATION  1980     Inpatient Medications: Scheduled Meds:  aspirin  325 mg Oral Daily   DULoxetine  30 mg Oral Daily   enoxaparin (LOVENOX) injection  40 mg Subcutaneous Daily   insulin aspart  0-15 Units Subcutaneous TID AC & HS   insulin glargine-yfgn  12 Units Subcutaneous Daily   metoprolol tartrate  50 mg Oral BID   pantoprazole  40 mg Oral Daily   potassium chloride  40 mEq Oral BID   rosuvastatin  20 mg Oral Daily   Continuous Infusions:  sodium chloride     nitroGLYCERIN 50 mcg/min (10/03/22 0642)   PRN Meds: acetaminophen **OR** [DISCONTINUED] acetaminophen, hydrALAZINE, ondansetron **OR** ondansetron (ZOFRAN) IV, polyethylene glycol  Allergies:    Allergies  Allergen Reactions   Dapagliflozin Itching   Codeine Nausea And Vomiting    But has tolerated morphine    Social History:   Social History   Socioeconomic History   Marital status: Married    Spouse name: Not on file   Number of children: Not on file   Years of education: Not on file   Highest  education level: Not on file  Occupational History   Not on file  Tobacco Use   Smoking status: Former    Packs/day: 1.50    Years: 37.00    Total pack years: 55.50    Types: Cigarettes    Quit date: 06/11/2012    Years since quitting: 10.3   Smokeless tobacco: Never  Vaping Use   Vaping Use: Never used  Substance and Sexual Activity   Alcohol use: Never    Comment: 11/03/2012 "aien't drank none in ~ 10 yr; then it was just an occasional daquiri on vacation"   Drug use: Never   Sexual activity: Not Currently  Other Topics Concern   Not on file  Social History Narrative   pt living with son, daughter in-law and grandson in single story home with ramped entrance. Pt reports that prior to hospitalization she was using a quad cane for ambulation due to increased LE pain with ambulation.    Pt working as Pharmacist, [employer] with decreasing standing tolerance.    Social Determinants of Health   Financial Resource Strain: Not on file  Food Insecurity: Food  Insecurity Present (10/02/2022)   Hunger Vital Sign    Worried About Running Out of Food in the Last Year: Sometimes true    Ran Out of Food in the Last Year: Never true  Transportation Needs: No Transportation Needs (10/02/2022)   PRAPARE - Hydrologist (Medical): No    Lack of Transportation (Non-Medical): No  Physical Activity: Not on file  Stress: No Stress Concern Present (09/21/2021)   [place]    Feeling of Stress : Only a little  Social Connections: Not on file  Intimate Partner Violence: Not At Risk (10/02/2022)   Humiliation, Afraid, Rape, and Kick questionnaire    Fear of Current or Ex-Partner: No    Emotionally Abused: No    Physically Abused: No    Sexually Abused: No    Family History:   Family History  Problem Relation Age of Onset   Heart failure Mother        Died in her 64s, had angina starting in  her 32s   Crohn's disease Brother      ROS:  Please see the history of present illness.  All other ROS reviewed and negative.     Physical Exam/Data:   Vitals:   10/03/22 0600 10/03/22 0640 10/03/22 0700 10/03/22 0716  BP: 136/74 136/79 136/68   Pulse: 98 98 99   Resp: (!) 21 17 (!) 25   Temp:    98.3 F (36.8 C)  TempSrc:    Oral  SpO2: 93% 96% 90%    No intake or output data in the 24 hours ending 10/03/22 0807    08/22/2022    3:26 PM 09/30/2021    2:49 PM 09/27/2021    1:17 PM  Last 3 Weights  Weight (lbs) 177 lb 164 lb 165 lb  Weight (kg) 80.287 kg 74.39 kg 74.844 kg     There is no height or weight on file to calculate BMI.  General:  Well nourished, well developed, in no acute distress HEENT: normal Neck: no JVD Vascular: No carotid bruits; Distal pulses 2+ bilaterally Cardiac:  normal S1, S2; RRR; no murmur  Lungs:  clear to auscultation bilaterally, no wheezing, rhonchi or rales  Abd: soft, nontender, no hepatomegaly  Ext: no edema Musculoskeletal:  No deformities, BUE and BLE strength normal and equal Skin: warm and dry  Neuro:  CNs 2-12 intact, no focal abnormalities noted Psych:  Normal affect   EKG:  The EKG was personally reviewed and demonstrates: Sinus rhythm with nonspecific ST/T wave changes Telemetry:  Telemetry was personally reviewed and demonstrates: Sinus tachycardia Relevant CV Studies:  Cath 10/2012 Findings:   Ao Pressure: 129/80 (102) LV Pressure: 128/7/12 There was no signficant gradient across the aortic valve on pullback.   Coronary dominance: co dominant    Left mainstem: Normal    Left anterior descending (LAD): Large wrapping the apex. 30% proximal at takeoff of large diagonal.  In the diagonal there is proximal 25%    Left circumflex (LCx): Co dominant. AV large and normal. OM large and branching with proximal stent which is widely patent. There is a small marginal branch that comes off at the distal end of the stent which  has a 30-40% lesion in the ostium. PDA large and normal    Right coronary artery (RCA): Small to moderate with a high anterior take off. Mild luminal irregularities. Mid 70-75%. PDA small and normal    Left  ventriculography: Left ventricular systolic function is normal, LVEF is estimated at 60-65%, there is no significant mitral regurgitation        Assessment: 1. CAD with patent OM stent and stable 70-75% in RCA 2. Normal LV function   Plan/Discussion:   Films reviewed with Dr. Burt Knack. Lesion in RCA is borderline but stable from last cath. Doubt this is a source of resting CP but may be potential source of exertional angina. Resting pain likely to be more GI. Will continue to treat CAD medically. Start protonix 40 daily and refer to GI. If CP persists, suggest outpatient Myoview to further evaluate significance of RCA disease.     Laboratory Data:  High Sensitivity Troponin:   Recent Labs  Lab 10/02/22 1529 10/02/22 1710 10/03/22 0158  TROPONINIHS 22* 22* 21*     Chemistry Recent Labs  Lab 10/02/22 1529 10/03/22 0516  NA 135 138  K 4.0 3.2*  CL 95* 100  CO2 28 27  GLUCOSE 271* 156*  BUN 32* 39*  CREATININE 0.97 1.50*  CALCIUM 9.0 9.1  MG  --  1.8  GFRNONAA >60 38*  ANIONGAP 12 11    Recent Labs  Lab 10/02/22 1529 10/03/22 0516  PROT 7.6 6.6  ALBUMIN 3.8 3.3*  AST 43* 29  ALT 24 26  ALKPHOS 79 73  BILITOT 1.1 0.6   Lipids No results for input(s): "CHOL", "TRIG", "HDL", "LABVLDL", "LDLCALC", "CHOLHDL" in the last 168 hours.  Hematology Recent Labs  Lab 10/02/22 1529 10/03/22 0516  WBC 10.4 10.8*  RBC 4.20 3.77*  HGB 13.2 11.9*  HCT 39.1 35.2*  MCV 93.1 93.4  MCH 31.4 31.6  MCHC 33.8 33.8  RDW 12.9 13.0  PLT 323 333   Thyroid No results for input(s): "TSH", "FREET4" in the last 168 hours.  BNPNo results for input(s): "BNP", "PROBNP" in the last 168 hours.  DDimer  Recent Labs  Lab 10/02/22 1949  DDIMER 1.03*     Radiology/Studies:  CT  Angio Chest Pulmonary Embolism (PE) W or WO Contrast  Result Date: 10/02/2022 CLINICAL DATA:  Chest pain and positive D-dimer, initial encounter EXAM: CT ANGIOGRAPHY CHEST WITH CONTRAST TECHNIQUE: Multidetector CT imaging of the chest was performed using the standard protocol during bolus administration of intravenous contrast. Multiplanar CT image reconstructions and MIPs were obtained to evaluate the vascular anatomy. RADIATION DOSE REDUCTION: This exam was performed according to the departmental dose-optimization program which includes automated exposure control, adjustment of the mA and/or kV according to patient size and/or use of iterative reconstruction technique. CONTRAST:  74m OMNIPAQUE IOHEXOL 350 MG/ML SOLN COMPARISON:  Chest x-ray from earlier in the same day. FINDINGS: Cardiovascular: Thoracic aorta demonstrates atherosclerotic calcifications without aneurysmal dilatation or dissection. The pulmonary artery is well visualized within normal branching pattern. No filling defect to suggest pulmonary embolism is noted. Mild coronary calcifications are noted. Mediastinum/Nodes: Thoracic inlet is within normal limits. The esophagus as visualized is within normal limits. No sizable hilar or mediastinal adenopathy is noted. Lungs/Pleura: Lungs are well aerated bilaterally. No focal infiltrate or sizable effusion is noted. Upper Abdomen: Visualized upper abdomen is within normal limits. Musculoskeletal: Degenerative changes of the thoracic spine are noted. No acute rib abnormality is noted. No compression deformity is seen. Review of the MIP images confirms the above findings. IMPRESSION: No evidence of pulmonary emboli. No acute abnormality noted. Aortic Atherosclerosis (ICD10-I70.0). Electronically Signed   By: MInez CatalinaM.D.   On: 10/02/2022 21:56   [facility]  Result Date: 10/02/2022 CLINICAL DATA:  Chest pain on and off for a while. EXAM: PORTABLE CHEST 1 VIEW COMPARISON:  05/27/2021.  FINDINGS: Cardiac silhouette is normal in size and configuration. No mediastinal or hilar masses. Clear lungs.  No pleural effusion or pneumothorax. Skeletal structures are grossly intact. IMPRESSION: No active disease. Electronically Signed   By: Lajean Manes M.D.   On: 10/02/2022 15:50   NM PET CT CARDIAC PERFUSION MULTI W/ABSOLUTE BLOODFLOW  Result Date: 10/02/2022   Small, reversible, moderate defect present in the basal inferolateral segment consistent with ischemia. EF drops with stress (68->63%). TID is present (1.19). Severely reduced coronary flow reserve (1.38). Findings are concerning for either balanced ischemia in the setting of 3-vessel CAD versus microvascular dysfunction. The patient reported chest pain on and off for the past 4 days occuring at rest. She was directed to the ER for work-up of possible unstable angina.   LV perfusion is abnormal. There is evidence of ischemia. There is no evidence of infarction. Defect 1: There is a small defect with moderate reduction in uptake present in the basal inferolateral location(s) that is reversible. There is normal wall motion in the defect area. Consistent with ischemia.   Rest left ventricular function is normal. Rest EF: 68 %. Stress left ventricular function is normal. Stress EF: 63 %. End diastolic cavity size is normal.   Myocardial blood flow was computed to be 1.54m/g/min at rest and 1.540mg/min at stress. Global myocardial blood flow reserve was 1.38 and was highly abnormal.   Coronary calcium was absent on the attenuation correction CT images.   Findings are consistent with ischemia. The study is high risk. Electronically signed by WeEleonore ChiquitoMD ___________________________________________________________________________ ________ EXJasmine DecemberOVER-READ INTERPRETATION  PET-CT CHEST The following report is an over-read performed by radiologist Dr. ErRosine [facility]South[facility] on 10/02/2022. This over-read does not include  interpretation of cardiac or coronary anatomy or pathology. The cardiac PET and cardiac CT interpretation by the cardiologist is to be attached. COMPARISON:  None. FINDINGS: No evidence for lymphadenopathy within the visualized mediastinum or hilar regions. The visualized lung parenchyma shows no suspicious pulmonary nodule or mass. No focal airspace consolidation. No effusion. Visualized portions of the upper abdomen are unremarkable. No suspicious lytic or sclerotic osseous abnormality. IMPRESSION: No acute or clinically significant extracardiac findings. Electronically Signed   By: ErMisty Stanley.D.   On: 10/02/2022 15:17    Assessment and Plan:   Unstable angina with hx of CAD s/p stenting to OM and 70 to 75% RCA stenosis.  Few months history of chest pressure concerning for angina.  Now abnormal PET scan insistent with ischemia.  She was transferred from stress test to ER due to chest pressure.  Currently 2 out of 10 chest pressure on nitroglycerin drip. Patient warrants cardiac catheterization. Continue aspirin 81 mg daily Continue Crestor and beta-blocker Update echocardiogram Troponin flat  The patient understands that risks include but are not limited to stroke (1 in 1000), death (1 in 1000), kidney failure [usually temporary] (1 in 500), bleeding (1 in 200), allergic reaction [possibly serious] (1 in 200), and agrees to proceed.     2.  Hypertension Blood pressure stable on beta-blocker  3. Sinus tachycardia No PE on CT angio of chest.  Continue metoprolol titrate 50 mg twice daily.  Rule out pericardial effusion.  Thankfully, blood pressure stable  4.  Hyperlipidemia hypertriglyceridemia -No results found for requested labs within last 365 days.  LDL 87 per office visit  note.  Her Crestor was increased to 20 mg daily at that time.  5.  Diabetes mellitus -On sliding scale insulin  6. AKI Seems baseline renal function around 1.2.  Yesterday it was normal at 0.97.  Today 1.5.   Likely due to contrast from CT.  Will hydrate prior to cardiac catheterization.  Follow closely.  7. Hypokalemia - Supplement   Risk Assessment/Risk Scores:     TIMI Risk Score for Unstable Angina or Non-ST Elevation MI:   The patient's TIMI risk score is  , which indicates a  % risk of all cause mortality, new or recurrent myocardial infarction or need for urgent revascularization in the next 14 days.   For questions or updates, please contact [place] Please consult [email or web address] for contact info under    Jarrett Soho, PA  10/03/2022 8:07 AM

## 2022-10-03 NOTE — Progress Notes (Signed)
PROGRESS NOTE   Amanda Mendez  BOF:[id number] DOB: October 18, 1956 DOA: 10/02/2022 PCP: Redmond School, MD  Brief Narrative:  66 year old white female known DM TY 2, HTN CAD PCI 08/15/2012 HFpEF EF 65-70% CKD 3 AA MGUS prior severe left lower extremity pain secondary to impinged nerve of lumbar spine. Seen by cardiology September 2023 Dr. Percival Spanish endorsing chest pain + SOB While undergoing cardiac perfusion study at [facility] 10/24 started having chest pain--PET = balanced ischemia MVD +/- microvascular disease and ST-T wave changes during study Work-up = slight elevation troponin 22, EKG no ST T wave changes-started on nitro gtt. after consultation with cardiology--- also found to have necrotic toe left little 10/26 cardiac cath-chronic total occlusion RCA proximal LAD first diagonal patent first obtuse marginal-cardiothoracic surgery consulted--CT PE study negative for PE, echocardiogram-70-75% with grade 1 DD 10/26 vascular surgery consulted-planning on doing angiogram   Hospital-Problem based course  Unstable angina with multivessel disease on cath 10/26 CAD and PCI in 08/15/2012 Cardiology assisting with management and appreciated-continue heparin as per them, aspirin 81 mg, metoprolol 50 twice daily, nitroglycerin gtt., Crestor 40 Cardiothoracic eval pending at this time  Dry gangrene left fifth toe Angiogram 10/27 per vascular surgery-May require amputation ABI pending Hold antibiotics at this time  ATN secondary to ARB/contrast load superimposed on CKD 3 AA Hydrating with fluids saline 75 cc/h Holding Hyzaar 100-25  Elevated D-dimer CTA chest negative for PE  Hypokalemia --resolved  DM TY 2 CBGs 1 68-2 55 Amaryl 2 mg 3 times daily on hold will resume after all procedures-continue sliding scale for now, use Levemir 12 units and will adjust daily   DVT prophylaxis: Continues on IV heparin therapeutic dosing Code Status: Full Family Communication: None present at the  bedside Disposition:  Status is: Inpatient Remains inpatient appropriate because: Will need evaluation by cardiothoracic surgery and will need angiogram and potential little toe amputation on the left side   Consultants:  Cardiology Cardiothoracic surgery Vascular surgery  Procedures: Cardiac cath 10/25 conclusion      1st Mrg lesion is 10% stenosed.   1.  Severe multivessel disease consisting of chronic total occlusion of right coronary artery (which has a high anterior takeoff), proximal LAD, and first diagonal disease. 2.  Patent first obtuse marginal stent. 3.  LVEDP of 26 mmHg.  Antimicrobials: None currently   Subjective: Feels fair no distress no chest pain some discomfort in lower extremity Seems to understand the plan  Objective: Vitals:   10/03/22 0500 10/03/22 0520 10/03/22 0600 10/03/22 0640  BP: 130/71 128/67 136/74 136/79  Pulse: 92 94 98 98  Resp: (!) 21 19 (!) 21 17  Temp:      TempSrc:      SpO2: 93% 95% 93% 96%   No intake or output data in the 24 hours ending 10/03/22 0645 There were no vitals filed for this visit.  Examination:  EOMI NCAT no focal deficit no icterus no pallor Chest is clear no wheeze rales rhonchi S1-S2 no murmur is in sinus rhythm in the 90s Abdomen soft no rebound no guarding Left little toe shows dry gangrene with clear line of demarcation-the tip of the second toe of the left foot also shows patch of dry gangrene  Data Reviewed: personally reviewed   CBC    Component Value Date/Time   WBC 10.8 (H) 10/03/2022 0516   RBC 3.77 (L) 10/03/2022 0516   HGB 11.9 (L) 10/03/2022 0516   HCT 35.2 (L) 10/03/2022 0516   PLT 333 10/03/2022  0516   MCV 93.4 10/03/2022 0516   MCH 31.6 10/03/2022 0516   MCHC 33.8 10/03/2022 0516   RDW 13.0 10/03/2022 0516   LYMPHSABS 2.4 10/03/2022 0516   MONOABS 1.1 (H) 10/03/2022 0516   EOSABS 0.3 10/03/2022 0516   BASOSABS 0.1 10/03/2022 0516      Latest Ref Rng & Units 10/03/2022    5:16 AM  10/02/2022    3:29 PM 09/16/2021    2:54 AM  CMP  Glucose 70 - 99 mg/dL 156  271  221   BUN 8 - 23 mg/dL 39  32  31   Creatinine 0.44 - 1.00 mg/dL 1.50  0.97  1.25   Sodium 135 - 145 mmol/L 138  135  135   Potassium 3.5 - 5.1 mmol/L 3.2  4.0  4.1   Chloride 98 - 111 mmol/L 100  95  102   CO2 22 - 32 mmol/L '27  28  24   '$ Calcium 8.9 - 10.3 mg/dL 9.1  9.0  9.2   Total Protein 6.5 - 8.1 g/dL 6.6  7.6    Total Bilirubin 0.3 - 1.2 mg/dL 0.6  1.1    Alkaline Phos 38 - 126 U/L 73  79    AST 15 - 41 U/L 29  43    ALT 0 - 44 U/L 26  24       Radiology Studies: CT Angio Chest Pulmonary Embolism (PE) W or WO Contrast  Result Date: 10/02/2022 CLINICAL DATA:  Chest pain and positive D-dimer, initial encounter EXAM: CT ANGIOGRAPHY CHEST WITH CONTRAST TECHNIQUE: Multidetector CT imaging of the chest was performed using the standard protocol during bolus administration of intravenous contrast. Multiplanar CT image reconstructions and MIPs were obtained to evaluate the vascular anatomy. RADIATION DOSE REDUCTION: This exam was performed according to the departmental dose-optimization program which includes automated exposure control, adjustment of the mA and/or kV according to patient size and/or use of iterative reconstruction technique. CONTRAST:  29m OMNIPAQUE IOHEXOL 350 MG/ML SOLN COMPARISON:  Chest x-ray from earlier in the same day. FINDINGS: Cardiovascular: Thoracic aorta demonstrates atherosclerotic calcifications without aneurysmal dilatation or dissection. The pulmonary artery is well visualized within normal branching pattern. No filling defect to suggest pulmonary embolism is noted. Mild coronary calcifications are noted. Mediastinum/Nodes: Thoracic inlet is within normal limits. The esophagus as visualized is within normal limits. No sizable hilar or mediastinal adenopathy is noted. Lungs/Pleura: Lungs are well aerated bilaterally. No focal infiltrate or sizable effusion is noted. Upper Abdomen:  Visualized upper abdomen is within normal limits. Musculoskeletal: Degenerative changes of the thoracic spine are noted. No acute rib abnormality is noted. No compression deformity is seen. Review of the MIP images confirms the above findings. IMPRESSION: No evidence of pulmonary emboli. No acute abnormality noted. Aortic Atherosclerosis (ICD10-I70.0). Electronically Signed   By: MInez CatalinaM.D.   On: 10/02/2022 21:56   DG Chest Port 1 View  Result Date: 10/02/2022 CLINICAL DATA:  Chest pain on and off for a while. EXAM: PORTABLE CHEST 1 VIEW COMPARISON:  05/27/2021. FINDINGS: Cardiac silhouette is normal in size and configuration. No mediastinal or hilar masses. Clear lungs.  No pleural effusion or pneumothorax. Skeletal structures are grossly intact. IMPRESSION: No active disease. Electronically Signed   By: DLajean ManesM.D.   On: 10/02/2022 15:50   NM PET CT CARDIAC PERFUSION MULTI W/ABSOLUTE BLOODFLOW  Result Date: 10/02/2022   Small, reversible, moderate defect present in the basal inferolateral segment consistent with ischemia. EF drops  with stress (68->63%). TID is present (1.19). Severely reduced coronary flow reserve (1.38). Findings are concerning for either balanced ischemia in the setting of 3-vessel CAD versus microvascular dysfunction. The patient reported chest pain on and off for the past 4 days occuring at rest. She was directed to the ER for work-up of possible unstable angina.   LV perfusion is abnormal. There is evidence of ischemia. There is no evidence of infarction. Defect 1: There is a small defect with moderate reduction in uptake present in the basal inferolateral location(s) that is reversible. There is normal wall motion in the defect area. Consistent with ischemia.   Rest left ventricular function is normal. Rest EF: 68 %. Stress left ventricular function is normal. Stress EF: 63 %. End diastolic cavity size is normal.   Myocardial blood flow was computed to be 1.14m/g/min  at rest and 1.535mg/min at stress. Global myocardial blood flow reserve was 1.38 and was highly abnormal.   Coronary calcium was absent on the attenuation correction CT images.   Findings are consistent with ischemia. The study is high risk. Electronically signed by WeEleonore ChiquitoMD ___________________________________________________________________________ ________ EXJasmine DecemberOVER-READ INTERPRETATION  PET-CT CHEST The following report is an over-read performed by radiologist [facility] on 10/02/2022. This over-read does not include interpretation of cardiac or coronary anatomy or pathology. The cardiac PET and cardiac CT interpretation by the cardiologist is to be attached. COMPARISON:  None. FINDINGS: No evidence for lymphadenopathy within the visualized mediastinum or hilar regions. The visualized lung parenchyma shows no suspicious pulmonary nodule or mass. No focal airspace consolidation. No effusion. Visualized portions of the upper abdomen are unremarkable. No suspicious lytic or sclerotic osseous abnormality. IMPRESSION: No acute or clinically significant extracardiac findings. Electronically Signed   By: ErMisty Stanley.D.   On: 10/02/2022 15:17    Scheduled Meds:  aspirin  325 mg Oral Daily   DULoxetine  30 mg Oral Daily   insulin aspart  0-15 Units Subcutaneous TID AC & HS   insulin glargine-yfgn  12 Units Subcutaneous Daily   metoprolol tartrate  50 mg Oral BID   pantoprazole  40 mg Oral Daily   rosuvastatin  20 mg Oral Daily   Continuous Infusions:  nitroGLYCERIN 50 mcg/min (10/03/22 0642)     LOS: 1 day   Time spent: 44BeverlyMD [facility] To contact the attending provider between 7A-7P or the covering provider during after hours 7P-7A, please log into the web site [email or web address] and access using universal [place] password for that web site. If you do not have the password, please call the hospital operator.  10/03/2022,  6:45 AM

## 2022-10-03 NOTE — Progress Notes (Signed)
ANTICOAGULATION CONSULT NOTE - Initial Consult  Pharmacy Consult for heparin Indication: chest pain/ACS  Allergies  Allergen Reactions   Dapagliflozin Itching   Codeine Nausea And Vomiting    But has tolerated morphine    Patient Measurements:   Heparin Dosing Weight: 65 kg  Vital Signs: Temp: 98.3 F (36.8 C) (10/25 0716) Temp Source: Oral (10/25 0716) BP: 136/68 (10/25 0700) Pulse Rate: 99 (10/25 0700)  Labs: Recent Labs    10/02/22 1529 10/02/22 1710 10/03/22 0158 10/03/22 0516  HGB 13.2  --   --  11.9*  HCT 39.1  --   --  35.2*  PLT 323  --   --  333  CREATININE 0.97  --   --  1.50*  TROPONINIHS 22* 22* 21*  --     CrCl cannot be calculated (Unknown ideal weight.).   Medical History: Past Medical History:  Diagnosis Date   Anginal pain ([place])    Coronary artery disease    angioplasty 9/13   Exertional dyspnea    "usually; today it was with nothing" (11/03/2012)   GERD (gastroesophageal reflux disease)    Hypercholesterolemia with hypertriglyceridemia    Hypertension    Kidney stones    "I've had them 5 times; lithotripsy 1st time; flushed out  all the others" (11/03/2012)   Obesity (BMI 30-39.9)    Pneumonia 2012   Sinus headache    "weekly" (11/03/2012)   Type II diabetes mellitus ([place]) 2007      Assessment: 66 year old female with unstable angina presented with chest pressure. Pharmacy consulted for management of heparin infusion for ACS. No anticoagulation noted PTA. Current plan for cardiac catheterization.   Baseline CBC stable. PT/INR, aPTT pending.  Goal of Therapy:  Heparin level 0.3-0.7 units/ml Monitor platelets by anticoagulation protocol: Yes   Plan:  -Heparin 3900 unit bolus -Heparin infusion at 750 units/hr -Check heparin level ~ 6 hours after start of infusion -Daily CBC while on heparin infusion  Tawnya Crook, PharmD, BCPS Clinical Pharmacist 10/03/2022 10:26 AM

## 2022-10-03 NOTE — Progress Notes (Signed)
Amanda Mendez  FUX:[id number] DOB: 1956/11/23 DOA: 10/02/2022 PCP: Redmond School, MD    Brief Narrative:  66 year old with a history of DM2, CAD status post stenting 0254, HTN, diastolic CHF, CKD stage IIIa, anemia of chronic disease, vitamin D deficiency, and MGUS who presented to the [facility] ED with chest pain immediately after having undergone a cardiac PET scan.  The pain has been present for approximately 4 days which prompted the skin.  In the ER she was found to have a troponin of 22 but no acute ST changes on her EKG.  At the recommendation of cardiology and nitroglycerin infusion was initiated.  Consultants:  None  Goals of Care:  Code Status: Full Code   DVT prophylaxis: Lovenox  Interim Hx: Afebrile since admission.  Vital signs stable with intermittent episodes of sinus tachycardia.  Oxygen saturations 90+ % on room air. Patient was transferred to [facility] under the care of Cardiology for a cardiac cath before I was able to examine her this morning.   Assessment & Plan:  Chest pain with very mildly elevated troponin -CAD status post stent 2013 Cardiac PET scan on day of admission noted evidence of reversible ischemia - transfered to [facility] for definitive cardiac evaluation -troponin trend has been flat since admission  Chronic diastolic CHF Well compensated at admission   CKD stage IIIa Creatinine at baseline at presentation but has increased overnight coincident with IV contrast for CTa - gently hydrate and follow trend  Elevated D-dimer CTa chest negative for PE  Mild hypokalemia Likely simply due to decreased intake - supplement and follow  Anemia of chronic disease No evidence of significant acute blood loss  DM 2 A1c pending - monitor CBG trend   HTN Blood pressure presently well controlled  GERD Continue usual PPI   Family Communication:  Disposition:  transfered to [facility] for risk stratification   Objective: Blood pressure 136/68, pulse  99, temperature 98.3 F (36.8 C), temperature source Oral, resp. rate (!) 25, SpO2 90 %. No intake or output data in the 24 hours ending 10/03/22 0755 There were no vitals filed for this visit.  Examination: Patient was transferred before I could complete a physical exam. She has been in the capable hands of the Cardiology service all day.   CBC: Recent Labs  Lab 10/02/22 1529 10/03/22 0516  WBC 10.4 10.8*  NEUTROABS 6.9 6.8  HGB 13.2 11.9*  HCT 39.1 35.2*  MCV 93.1 93.4  PLT 323 270   Basic Metabolic Panel: Recent Labs  Lab 10/02/22 1529 10/03/22 0516  NA 135 138  K 4.0 3.2*  CL 95* 100  CO2 28 27  GLUCOSE 271* 156*  BUN 32* 39*  CREATININE 0.97 1.50*  CALCIUM 9.0 9.1  MG  --  1.8   GFR: CrCl cannot be calculated (Unknown ideal weight.).   Scheduled Meds:  aspirin  325 mg Oral Daily   DULoxetine  30 mg Oral Daily   insulin aspart  0-15 Units Subcutaneous TID AC & HS   insulin glargine-yfgn  12 Units Subcutaneous Daily   metoprolol tartrate  50 mg Oral BID   pantoprazole  40 mg Oral Daily   rosuvastatin  20 mg Oral Daily   Continuous Infusions:  nitroGLYCERIN 50 mcg/min (10/03/22 0642)     LOS: 1 day   Cherene Altes, MD [facility] Office  [phone] Pager - Text Page per Shea Evans  If 7PM-7AM, please contact night-coverage per Amion 10/03/2022, 7:55 AM

## 2022-10-03 NOTE — Progress Notes (Signed)
*  PRELIMINARY RESULTS* Echocardiogram 2D Echocardiogram has been performed.  Amanda Mendez 10/03/2022, 10:32 AM

## 2022-10-03 NOTE — Interval H&P Note (Signed)
History and Physical Interval Note:  10/03/2022 3:40 PM  Amanda Mendez  has presented today for surgery, with the diagnosis of unstable angina.  The various methods of treatment have been discussed with the patient and family. After consideration of risks, benefits and other options for treatment, the patient has consented to  Procedure(s): LEFT HEART CATH AND CORONARY ANGIOGRAPHY (N/A) as a surgical intervention.  The patient's history has been reviewed, patient examined, no change in status, stable for surgery.  I have reviewed the patient's chart and labs.  Questions were answered to the patient's satisfaction.     Early Osmond

## 2022-10-03 NOTE — Inpatient Diabetes Management (Signed)
Inpatient Diabetes Program Recommendations  AACE/ADA: New Consensus Statement on Inpatient Glycemic Control (2015)  Target Ranges:  Prepandial:   less than 140 mg/dL      Peak postprandial:   less than 180 mg/dL (1-2 hours)      Critically ill patients:  140 - 180 mg/dL   Lab Results  Component Value Date   GLUCAP 188 (H) 10/03/2022   HGBA1C 11.3 (H) 10/03/2022    Review of Glycemic Control  Latest Reference Range & Units 10/02/22 21:52 10/03/22 08:19 10/03/22 12:54  Glucose-Capillary 70 - 99 mg/dL 352 (H) 208 (H) 188 (H)  (H): Data is abnormally high Diabetes history: Type 2 DM Outpatient Diabetes medications: Amaryl 2 mg TID, Lantus 50 units QD Current orders for Inpatient glycemic control: Semglee 12 units QD, Novolog 0-15 units TID & HS  Inpatient Diabetes Program Recommendations:    Could consider increasing Semglee to 15 units QD.   Spoke with patient at length at bedside regarding home diabetes management. Patient reports that she has just started taking Lantus within the last few weeks and has continue to increase the dose as instructed. Reports that she was taking Januvia but insurance no longer covers this medication and she is asking about other oral agents.  Reviewed patient's current A1c of 11.3%. Explained what a A1c is and what it measures. Also reviewed goal A1c with patient, importance of good glucose control @ home, and blood sugar goals. Reviewed patho of DM, need for improved control, impact from cardiac perspective, survival skills, interventions, vascular changes and commorbidities.  Patient has a meter and checks several times a day (110-250's mg/dL). Reviewed when to follow up with PCP. Patient has appointment made.  Denies drinking sugary beverages, however struggles with portion control. Reviewed plate method, incorporating protein into diet and benefits especially with snacks, importance of portion sizes and general goal setting for CHO mindfulness.  No  further questions at this time.   Thanks, Bronson Curb, MSN, RNC-OB Diabetes Coordinator [phone] (8a-5p)

## 2022-10-03 NOTE — ED Notes (Signed)
Pt appears to be sleeping, observe unlabored and even RR, VSS, Niro drip remains infusings at this time, NAD noted, side rails up x2 for safety, plan of care ongoing, call light within reach, no further concerns as of present.

## 2022-10-03 NOTE — Progress Notes (Signed)
Dr. Ali Lowe made aware of left, small toe, black in color

## 2022-10-03 NOTE — Progress Notes (Addendum)
Pt arrived to cath lab holding, [place] 6, transferred to stretcher, connected to monitor, denies pain at this time, NTG gtt @ 31mg/min, heparin gtt at 750 units/hour and normal saline at 80cc/hr, all infusing into right forearm 20g PIV with 2 ports noted, pt noted to have a black to brown in color left, small (pinky) toe, surrounded with pinkness, bilateral pedal and radial pulses at +2, pt states she has been treating "pinky" toe at home, provided to be notified, safety maintained

## 2022-10-03 NOTE — H&P (View-Only) (Signed)
Cardiology Consultation   Patient ID: Amanda Mendez MRN: [id number]; DOB: 11/17/1956  Admit date: 10/02/2022 Date of Consult: 10/03/2022  PCP:  [facility], [place] Providers Cardiologist:  Minus Breeding, MD   {  Patient Profile:   Amanda Mendez is a 66 y.o. female with a hx of CAD s/p stenting to OM in 2013, hypertension, diabetes, hyperlipidemia and intermittent renal insufficiency who is being seen 10/03/2022 for the evaluation of chest pain at the request of Dr. Cyd Silence.  Last cardiac catheterization November 2013 showed patent OM stent and stable 70 to 75% RCA stenosis.  Medical management recommended.  Last echocardiogram in 2015 with preserved LV function and moderate LVH.  History of Present Illness:   Amanda Mendez recently seen by Dr. Percival Spanish in September 2023 with few months history of chest pain concerning for angina.  She was scheduled for PET stress test.  Yesterday during stress test patient had severe substernal chest pressure radiating to left shoulder.  Associated with severe shortness of breath but no nausea or vomiting.  She was sent to ER for further evaluation.  High-sensitivity troponin flat at 22.  CT angio of the chest without pulmonary embolism.  She was placed on IV nitroglycerin with improvement chest pressure.  Currently 2 out of 10 chest pressure.  Renal function 1.5 from normal.  Potassium 3.2.  Hemoglobin 11.9.  Sinus tachycardia on monitor.  Patient reports few months history of, initial exertional chest pressure now occurring at rest.  Associated with shortness of breath and nausea in past.  PET cardiac stress test 10/02/22  Small, reversible, moderate defect present in the basal inferolateral segment consistent with ischemia. EF drops with stress (68->63%). TID is present (1.19). Severely reduced coronary flow reserve (1.38). Findings are concerning for either balanced ischemia in the setting of 3-vessel CAD versus  microvascular dysfunction. The patient reported chest pain on and off for the past 4 days occuring at rest. She was directed to the ER for work-up of possible unstable angina.   LV perfusion is abnormal. There is evidence of ischemia. There is no evidence of infarction. Defect 1: There is a small defect with moderate reduction in uptake present in the basal inferolateral location(s) that is reversible. There is normal wall motion in the defect area. Consistent with ischemia.   Rest left ventricular function is normal. Rest EF: 68 %. Stress left ventricular function is normal. Stress EF: 63 %. End diastolic cavity size is normal.   Myocardial blood flow was computed to be 1.65m/g/min at rest and 1.52mg/min at stress. Global myocardial blood flow reserve was 1.38 and was highly abnormal.   Coronary calcium was absent on the attenuation correction CT images.   Findings are consistent with ischemia. The study is high risk.    Past Medical History:  Diagnosis Date   [place]inal pain ([place]   Coronary artery disease    angioplasty 9/13   Exertional dyspnea    "usually; today it was with nothing" (11/03/2012)   GERD (gastroesophageal reflux disease)    Hypercholesterolemia with hypertriglyceridemia    Hypertension    Kidney stones    "I've had them 5 times; lithotripsy 1st time; flushed out  all the others" (11/03/2012)   Obesity (BMI 30-39.9)    Pneumonia 2012   Sinus headache    "weekly" (11/03/2012)   Type II diabetes mellitus ([facility]    Past Surgical History:  Procedure Laterality Date   ABDOMINAL AORTOGRAM W/LOWER EXTREMITY Left 09/12/2021  Procedure: ABDOMINAL AORTOGRAM W/LOWER EXTREMITY;  Surgeon: Serafina Mitchell, MD;  Location: [facility];  Service: Cardiovascular;  Laterality: Left;   CARDIAC CATHETERIZATION  11/25/29013   CORONARY ANGIOPLASTY WITH STENT PLACEMENT  08/2012   "1"   HEMATOMA EVACUATION  09/22/2012   Procedure: EVACUATION HEMATOMA;  Surgeon: Wynonia Sours,  MD;  Location: [facility];  Service: Orthopedics;  Laterality: Right;  Evacuation Hematoma right hand   LEFT AND RIGHT HEART CATHETERIZATION WITH CORONARY ANGIOGRAM N/A 08/14/2012   Procedure: LEFT AND RIGHT HEART CATHETERIZATION WITH CORONARY ANGIOGRAM;  Surgeon: Minus Breeding, MD;  Location: [facility] CATH LAB;  Service: Cardiovascular;  Laterality: N/A;   LEFT HEART CATHETERIZATION WITH CORONARY ANGIOGRAM N/A 11/03/2012   Procedure: LEFT HEART CATHETERIZATION WITH CORONARY ANGIOGRAM;  Surgeon: Jolaine Artist, MD;  Location: [facility] C[facility] LAB;  Service: Cardiovascular;  Laterality: N/A;   LITHOTRIPSY  1990's   PERCUTANEOUS CORONARY STENT INTERVENTION (PCI-S)  08/14/2012   Procedure: PERCUTANEOUS CORONARY STENT INTERVENTION (PCI-S);  Surgeon: Minus Breeding, MD;  Location: [facility] CATH LAB;  Service: Cardiovascular;;   SKIN BIOPSY  2012   "off my back; it was nothing fatal; infection caused it" (11/03/2012)   TUBAL LIGATION  1980     Inpatient Medications: Scheduled Meds:  aspirin  325 mg Oral Daily   DULoxetine  30 mg Oral Daily   enoxaparin (LOVENOX) injection  40 mg Subcutaneous Daily   insulin aspart  0-15 Units Subcutaneous TID AC & HS   insulin glargine-yfgn  12 Units Subcutaneous Daily   metoprolol tartrate  50 mg Oral BID   pantoprazole  40 mg Oral Daily   potassium chloride  40 mEq Oral BID   rosuvastatin  20 mg Oral Daily   Continuous Infusions:  sodium chloride     nitroGLYCERIN 50 mcg/min (10/03/22 0642)   PRN Meds: acetaminophen **OR** [DISCONTINUED] acetaminophen, hydrALAZINE, ondansetron **OR** ondansetron (ZOFRAN) IV, polyethylene glycol  Allergies:    Allergies  Allergen Reactions   Dapagliflozin Itching   Codeine Nausea And Vomiting    But has tolerated morphine    Social History:   Social History   Socioeconomic History   Marital status: Married    Spouse name: Not on file   Number of children: Not on file   Years of education: Not on file   Highest  education level: Not on file  Occupational History   Not on file  Tobacco Use   Smoking status: Former    Packs/day: 1.50    Years: 37.00    Total pack years: 55.50    Types: Cigarettes    Quit date: 06/11/2012    Years since quitting: 10.3   Smokeless tobacco: Never  Vaping Use   Vaping Use: Never used  Substance and Sexual Activity   Alcohol use: Never    Comment: 11/03/2012 "aien't drank none in ~ 10 yr; then it was just an occasional daquiri on vacation"   Drug use: Never   Sexual activity: Not Currently  Other Topics Concern   Not on file  Social History Narrative   pt living with son, daughter in-law and grandson in single story home with ramped entrance. Pt reports that prior to hospitalization she was using a quad cane for ambulation due to increased LE pain with ambulation.    Pt working as Pharmacist, [employer] with decreasing standing tolerance.    Social Determinants of Health   Financial Resource Strain: Not on file  Food Insecurity: Food  Insecurity Present (10/02/2022)   Hunger Vital Sign    Worried About Running Out of Food in the Last Year: Sometimes true    Ran Out of Food in the Last Year: Never true  Transportation Needs: No Transportation Needs (10/02/2022)   PRAPARE - Hydrologist (Medical): No    Lack of Transportation (Non-Medical): No  Physical Activity: Not on file  Stress: No Stress Concern Present (09/21/2021)   [place]    Feeling of Stress : Only a little  Social Connections: Not on file  Intimate Partner Violence: Not At Risk (10/02/2022)   Humiliation, Afraid, Rape, and Kick questionnaire    Fear of Current or Ex-Partner: No    Emotionally Abused: No    Physically Abused: No    Sexually Abused: No    Family History:   Family History  Problem Relation Age of Onset   Heart failure Mother        Died in her 61s, had angina starting in  her 44s   Crohn's disease Brother      ROS:  Please see the history of present illness.  All other ROS reviewed and negative.     Physical Exam/Data:   Vitals:   10/03/22 0600 10/03/22 0640 10/03/22 0700 10/03/22 0716  BP: 136/74 136/79 136/68   Pulse: 98 98 99   Resp: (!) 21 17 (!) 25   Temp:    98.3 F (36.8 C)  TempSrc:    Oral  SpO2: 93% 96% 90%    No intake or output data in the 24 hours ending 10/03/22 0807    08/22/2022    3:26 PM 09/30/2021    2:49 PM 09/27/2021    1:17 PM  Last 3 Weights  Weight (lbs) 177 lb 164 lb 165 lb  Weight (kg) 80.287 kg 74.39 kg 74.844 kg     There is no height or weight on file to calculate BMI.  General:  Well nourished, well developed, in no acute distress HEENT: normal Neck: no JVD Vascular: No carotid bruits; Distal pulses 2+ bilaterally Cardiac:  normal S1, S2; RRR; no murmur  Lungs:  clear to auscultation bilaterally, no wheezing, rhonchi or rales  Abd: soft, nontender, no hepatomegaly  Ext: no edema Musculoskeletal:  No deformities, BUE and BLE strength normal and equal Skin: warm and dry  Neuro:  CNs 2-12 intact, no focal abnormalities noted Psych:  Normal affect   EKG:  The EKG was personally reviewed and demonstrates: Sinus rhythm with nonspecific ST/T wave changes Telemetry:  Telemetry was personally reviewed and demonstrates: Sinus tachycardia Relevant CV Studies:  Cath 10/2012 Findings:   Ao Pressure: 129/80 (102) LV Pressure: 128/7/12 There was no signficant gradient across the aortic valve on pullback.   Coronary dominance: co dominant    Left mainstem: Normal    Left anterior descending (LAD): Large wrapping the apex. 30% proximal at takeoff of large diagonal.  In the diagonal there is proximal 25%    Left circumflex (LCx): Co dominant. AV large and normal. OM large and branching with proximal stent which is widely patent. There is a small marginal branch that comes off at the distal end of the stent which  has a 30-40% lesion in the ostium. PDA large and normal    Right coronary artery (RCA): Small to moderate with a high anterior take off. Mild luminal irregularities. Mid 70-75%. PDA small and normal    Left  ventriculography: Left ventricular systolic function is normal, LVEF is estimated at 60-65%, there is no significant mitral regurgitation        Assessment: 1. CAD with patent OM stent and stable 70-75% in RCA 2. Normal LV function   Plan/Discussion:   Films reviewed with Dr. Burt Knack. Lesion in RCA is borderline but stable from last cath. Doubt this is a source of resting CP but may be potential source of exertional angina. Resting pain likely to be more GI. Will continue to treat CAD medically. Start protonix 40 daily and refer to GI. If CP persists, suggest outpatient Myoview to further evaluate significance of RCA disease.     Laboratory Data:  High Sensitivity Troponin:   Recent Labs  Lab 10/02/22 1529 10/02/22 1710 10/03/22 0158  TROPONINIHS 22* 22* 21*     Chemistry Recent Labs  Lab 10/02/22 1529 10/03/22 0516  NA 135 138  K 4.0 3.2*  CL 95* 100  CO2 28 27  GLUCOSE 271* 156*  BUN 32* 39*  CREATININE 0.97 1.50*  CALCIUM 9.0 9.1  MG  --  1.8  GFRNONAA >60 38*  ANIONGAP 12 11    Recent Labs  Lab 10/02/22 1529 10/03/22 0516  PROT 7.6 6.6  ALBUMIN 3.8 3.3*  AST 43* 29  ALT 24 26  ALKPHOS 79 73  BILITOT 1.1 0.6   Lipids No results for input(s): "CHOL", "TRIG", "HDL", "LABVLDL", "LDLCALC", "CHOLHDL" in the last 168 hours.  Hematology Recent Labs  Lab 10/02/22 1529 10/03/22 0516  WBC 10.4 10.8*  RBC 4.20 3.77*  HGB 13.2 11.9*  HCT 39.1 35.2*  MCV 93.1 93.4  MCH 31.4 31.6  MCHC 33.8 33.8  RDW 12.9 13.0  PLT 323 333   Thyroid No results for input(s): "TSH", "FREET4" in the last 168 hours.  BNPNo results for input(s): "BNP", "PROBNP" in the last 168 hours.  DDimer  Recent Labs  Lab 10/02/22 1949  DDIMER 1.03*     Radiology/Studies:  CT  Angio Chest Pulmonary Embolism (PE) W or WO Contrast  Result Date: 10/02/2022 CLINICAL DATA:  Chest pain and positive D-dimer, initial encounter EXAM: CT ANGIOGRAPHY CHEST WITH CONTRAST TECHNIQUE: Multidetector CT imaging of the chest was performed using the standard protocol during bolus administration of intravenous contrast. Multiplanar CT image reconstructions and MIPs were obtained to evaluate the vascular anatomy. RADIATION DOSE REDUCTION: This exam was performed according to the departmental dose-optimization program which includes automated exposure control, adjustment of the mA and/or kV according to patient size and/or use of iterative reconstruction technique. CONTRAST:  65m OMNIPAQUE IOHEXOL 350 MG/ML SOLN COMPARISON:  Chest x-ray from earlier in the same day. FINDINGS: Cardiovascular: Thoracic aorta demonstrates atherosclerotic calcifications without aneurysmal dilatation or dissection. The pulmonary artery is well visualized within normal branching pattern. No filling defect to suggest pulmonary embolism is noted. Mild coronary calcifications are noted. Mediastinum/Nodes: Thoracic inlet is within normal limits. The esophagus as visualized is within normal limits. No sizable hilar or mediastinal adenopathy is noted. Lungs/Pleura: Lungs are well aerated bilaterally. No focal infiltrate or sizable effusion is noted. Upper Abdomen: Visualized upper abdomen is within normal limits. Musculoskeletal: Degenerative changes of the thoracic spine are noted. No acute rib abnormality is noted. No compression deformity is seen. Review of the MIP images confirms the above findings. IMPRESSION: No evidence of pulmonary emboli. No acute abnormality noted. Aortic Atherosclerosis (ICD10-I70.0). Electronically Signed   By: MInez CatalinaM.D.   On: 10/02/2022 21:56   [facility]  Result Date: 10/02/2022 CLINICAL DATA:  Chest pain on and off for a while. EXAM: PORTABLE CHEST 1 VIEW COMPARISON:  05/27/2021.  FINDINGS: Cardiac silhouette is normal in size and configuration. No mediastinal or hilar masses. Clear lungs.  No pleural effusion or pneumothorax. Skeletal structures are grossly intact. IMPRESSION: No active disease. Electronically Signed   By: Lajean Manes M.D.   On: 10/02/2022 15:50   NM PET CT CARDIAC PERFUSION MULTI W/ABSOLUTE BLOODFLOW  Result Date: 10/02/2022   Small, reversible, moderate defect present in the basal inferolateral segment consistent with ischemia. EF drops with stress (68->63%). TID is present (1.19). Severely reduced coronary flow reserve (1.38). Findings are concerning for either balanced ischemia in the setting of 3-vessel CAD versus microvascular dysfunction. The patient reported chest pain on and off for the past 4 days occuring at rest. She was directed to the ER for work-up of possible unstable angina.   LV perfusion is abnormal. There is evidence of ischemia. There is no evidence of infarction. Defect 1: There is a small defect with moderate reduction in uptake present in the basal inferolateral location(s) that is reversible. There is normal wall motion in the defect area. Consistent with ischemia.   Rest left ventricular function is normal. Rest EF: 68 %. Stress left ventricular function is normal. Stress EF: 63 %. End diastolic cavity size is normal.   Myocardial blood flow was computed to be 1.68m/g/min at rest and 1.575mg/min at stress. Global myocardial blood flow reserve was 1.38 and was highly abnormal.   Coronary calcium was absent on the attenuation correction CT images.   Findings are consistent with ischemia. The study is high risk. Electronically signed by WeEleonore ChiquitoMD ___________________________________________________________________________ ________ EXJasmine DecemberOVER-READ INTERPRETATION  PET-CT CHEST The following report is an over-read performed by radiologist Dr. ErRosine [facility] on 10/02/2022. This over-read does not include  interpretation of cardiac or coronary anatomy or pathology. The cardiac PET and cardiac CT interpretation by the cardiologist is to be attached. COMPARISON:  None. FINDINGS: No evidence for lymphadenopathy within the visualized mediastinum or hilar regions. The visualized lung parenchyma shows no suspicious pulmonary nodule or mass. No focal airspace consolidation. No effusion. Visualized portions of the upper abdomen are unremarkable. No suspicious lytic or sclerotic osseous abnormality. IMPRESSION: No acute or clinically significant extracardiac findings. Electronically Signed   By: ErMisty Stanley.D.   On: 10/02/2022 15:17    Assessment and Plan:   Unstable angina with hx of CAD s/p stenting to OM and 70 to 75% RCA stenosis.  Few months history of chest pressure concerning for angina.  Now abnormal PET scan insistent with ischemia.  She was transferred from stress test to ER due to chest pressure.  Currently 2 out of 10 chest pressure on nitroglycerin drip. Patient warrants cardiac catheterization. Continue aspirin 81 mg daily Continue Crestor and beta-blocker Update echocardiogram Troponin flat  The patient understands that risks include but are not limited to stroke (1 in 1000), death (1 in 1000), kidney failure [usually temporary] (1 in 500), bleeding (1 in 200), allergic reaction [possibly serious] (1 in 200), and agrees to proceed.     2.  Hypertension Blood pressure stable on beta-blocker  3. Sinus tachycardia No PE on CT angio of chest.  Continue metoprolol titrate 50 mg twice daily.  Rule out pericardial effusion.  Thankfully, blood pressure stable  4.  Hyperlipidemia hypertriglyceridemia -No results found for requested labs within last 365 days.  LDL 87 per office visit  note.  Her Crestor was increased to 20 mg daily at that time.  5.  Diabetes mellitus -On sliding scale insulin  6. AKI Seems baseline renal function around 1.2.  Yesterday it was normal at 0.97.  Today 1.5.   Likely due to contrast from CT.  Will hydrate prior to cardiac catheterization.  Follow closely.  7. Hypokalemia - Supplement   Risk Assessment/Risk Scores:     TIMI Risk Score for Unstable Angina or Non-ST Elevation MI:   The patient's TIMI risk score is  , which indicates a  % risk of all cause mortality, new or recurrent myocardial infarction or need for urgent revascularization in the next 14 days.   For questions or updates, please contact [place] Please consult [email or web address] for contact info under    Jarrett Soho, PA  10/03/2022 8:07 AM

## 2022-10-03 NOTE — Progress Notes (Signed)
Pt admitted to rm 21 from cath lab. CHG wipe given. Initiated tele. VSS. Oriented pt to the unit.   Lavenia Atlas, RN

## 2022-10-04 ENCOUNTER — Encounter (HOSPITAL_COMMUNITY): Payer: Self-pay | Admitting: Internal Medicine

## 2022-10-04 ENCOUNTER — Inpatient Hospital Stay (HOSPITAL_COMMUNITY): Payer: Medicare Other

## 2022-10-04 ENCOUNTER — Encounter (HOSPITAL_COMMUNITY): Payer: Medicare Other

## 2022-10-04 DIAGNOSIS — Z01810 Encounter for preprocedural cardiovascular examination: Secondary | ICD-10-CM

## 2022-10-04 DIAGNOSIS — R079 Chest pain, unspecified: Secondary | ICD-10-CM | POA: Diagnosis not present

## 2022-10-04 DIAGNOSIS — I96 Gangrene, not elsewhere classified: Secondary | ICD-10-CM | POA: Diagnosis not present

## 2022-10-04 DIAGNOSIS — I200 Unstable angina: Secondary | ICD-10-CM | POA: Diagnosis not present

## 2022-10-04 LAB — BASIC METABOLIC PANEL
Anion gap: 10 (ref 5–15)
BUN: 29 mg/dL — ABNORMAL HIGH (ref 8–23)
CO2: 23 mmol/L (ref 22–32)
Calcium: 8.8 mg/dL — ABNORMAL LOW (ref 8.9–10.3)
Chloride: 104 mmol/L (ref 98–111)
Creatinine, Ser: 1.46 mg/dL — ABNORMAL HIGH (ref 0.44–1.00)
GFR, Estimated: 39 mL/min — ABNORMAL LOW (ref >60)
Glucose, Bld: 208 mg/dL — ABNORMAL HIGH (ref 70–99)
Potassium: 3.9 mmol/L (ref 3.5–5.1)
Sodium: 137 mmol/L (ref 135–145)

## 2022-10-04 LAB — GLUCOSE, CAPILLARY
Glucose-Capillary: 168 mg/dL — ABNORMAL HIGH (ref 70–99)
Glucose-Capillary: 243 mg/dL — ABNORMAL HIGH (ref 70–99)
Glucose-Capillary: 200 mg/dL — ABNORMAL HIGH (ref 70–99)

## 2022-10-04 LAB — CBC
HCT: 31.2 % — ABNORMAL LOW (ref 36.0–46.0)
Hemoglobin: 10.3 g/dL — ABNORMAL LOW (ref 12.0–15.0)
MCH: 31.2 pg (ref 26.0–34.0)
MCHC: 33 g/dL (ref 30.0–36.0)
MCV: 94.5 fL (ref 80.0–100.0)
Platelets: 286 10*3/uL (ref 150–400)
RBC: 3.3 MIL/uL — ABNORMAL LOW (ref 3.87–5.11)
RDW: 12.8 % (ref 11.5–15.5)
WBC: 8.8 10*3/uL (ref 4.0–10.5)
nRBC: 0 % (ref 0.0–0.2)

## 2022-10-04 LAB — URINALYSIS, ROUTINE W REFLEX MICROSCOPIC
Bacteria, UA: NONE SEEN
Bilirubin Urine: NEGATIVE
Glucose, UA: >=500 mg/dL — AB
Ketones, ur: NEGATIVE mg/dL
Nitrite: NEGATIVE
Protein, ur: 30 mg/dL — AB
Specific Gravity, Urine: 1.037 — ABNORMAL HIGH (ref 1.005–1.030)
pH: 5 (ref 5.0–8.0)

## 2022-10-04 LAB — HEPARIN LEVEL (UNFRACTIONATED): Heparin Unfractionated: 0.15 IU/mL — ABNORMAL LOW (ref 0.30–0.70)

## 2022-10-04 MED ORDER — ROSUVASTATIN CALCIUM 20 MG PO TABS
40.0000 mg | ORAL_TABLET | Freq: Every day | ORAL | Status: DC
  Administered 2022-10-04 – 2022-10-18 (×14): 40 mg via ORAL
  Filled 2022-10-04 (×14): qty 2

## 2022-10-04 MED ORDER — HEPARIN (PORCINE) 25000 UT/250ML-% IV SOLN
1100.0000 [IU]/h | INTRAVENOUS | Status: DC
  Administered 2022-10-04: 750 [IU]/h via INTRAVENOUS
  Administered 2022-10-05: 1100 [IU]/h via INTRAVENOUS
  Filled 2022-10-04: qty 250

## 2022-10-04 MED ORDER — SODIUM CHLORIDE 0.9 % IV SOLN: 250.0000 mL | INTRAVENOUS | Status: DC | PRN

## 2022-10-04 MED ORDER — SODIUM CHLORIDE 0.9% FLUSH: 3.0000 mL | INTRAVENOUS | Status: DC | PRN

## 2022-10-04 MED ORDER — HEPARIN (PORCINE) 25000 UT/250ML-% IV SOLN
INTRAVENOUS | Status: AC
  Filled 2022-10-04: qty 250

## 2022-10-04 NOTE — Progress Notes (Signed)
[place] for heparin Indication: chest pain/ACS  Allergies  Allergen Reactions   Dapagliflozin Itching   Codeine Nausea And Vomiting    But has tolerated morphine    Patient Measurements: Height: '5\' 1"'$  (154.9 cm) Weight: 81.6 kg (180 lb) IBW/kg (Calculated) : 47.8 Heparin Dosing Weight: 65 kg  Vital Signs: Temp: 98.3 F (36.8 C) (10/26 1040) Temp Source: Oral (10/26 1040) BP: 149/68 (10/26 1040) Pulse Rate: 95 (10/26 1040)  Labs: Recent Labs    10/02/22 1529 10/02/22 1710 10/03/22 0158 10/03/22 0516 10/03/22 1046 10/04/22 0132 10/04/22 1055  HGB 13.2  --   --  11.9*  --  10.3*  --   HCT 39.1  --   --  35.2*  --  31.2*  --   PLT 323  --   --  333  --  286  --   APTT  --   --   --   --  23*  --   --   LABPROT  --   --   --   --  12.7  --   --   INR  --   --   --   --  1.0  --   --   HEPARINUNFRC  --   --   --   --   --   --  0.15*  CREATININE 0.97  --   --  1.50*  --  1.46*  --   TROPONINIHS 22* 22* 21*  --   --   --   --      Estimated Creatinine Clearance: 36.2 mL/min (A) (by C-G formula based on SCr of 1.46 mg/dL (H)).  Assessment: 66 year old female with unstable angina presented with chest pressure. Pharmacy consulted for management of heparin infusion for ACS. No anticoagulation noted PTA.   Pharmacy consulted to resume heparin gtt now s/p cardiac catheterization on 10/25.   Goal of Therapy:  Heparin level 0.3-0.7 units/ml Monitor platelets by anticoagulation protocol: Yes   Plan:  -Heparin infusion to 950 units / hr -Daily CBC while on heparin infusion  Thank you Anette Guarneri, PharmD 10/04/2022 12:35 PM

## 2022-10-04 NOTE — Progress Notes (Signed)
VASCULAR LAB    Pre CABG Dopplers have been performed.  See CV proc for preliminary results.   Connar Keating, RVT 10/04/2022, 1:06 PM

## 2022-10-04 NOTE — Progress Notes (Addendum)
Right radial TR band is gradually deflated per protocol and  then removed successfully. Pressure dressing applied, negative for bleeding or hematoma.  Pt is hemodynamically stable. No acute distress noted. Continue gtt Nitroglycerine 50 mcg/min. Pt denies chest pain. NSR on the monitor. We will continue to monitor.  Kennyth Lose, RN

## 2022-10-04 NOTE — Progress Notes (Signed)
[place] for heparin Indication: chest pain/ACS  Allergies  Allergen Reactions   Dapagliflozin Itching   Codeine Nausea And Vomiting    But has tolerated morphine    Patient Measurements: Height: '5\' 1"'$  (154.9 cm) Weight: 80.3 kg (177 lb) IBW/kg (Calculated) : 47.8 Heparin Dosing Weight: 65 kg  Vital Signs: Temp: 98 F (36.7 C) (10/25 2300) Temp Source: Oral (10/25 2300) BP: 137/73 (10/25 2300) Pulse Rate: 98 (10/25 2300)  Labs: Recent Labs    10/02/22 1529 10/02/22 1710 10/03/22 0158 10/03/22 0516 10/03/22 1046 10/04/22 0132  HGB 13.2  --   --  11.9*  --  10.3*  HCT 39.1  --   --  35.2*  --  31.2*  PLT 323  --   --  333  --  286  APTT  --   --   --   --  23*  --   LABPROT  --   --   --   --  12.7  --   INR  --   --   --   --  1.0  --   CREATININE 0.97  --   --  1.50*  --   --   TROPONINIHS 22* 22* 21*  --   --   --      Estimated Creatinine Clearance: 34.9 mL/min (A) (by C-G formula based on SCr of 1.5 mg/dL (H)).   Medical History: Past Medical History:  Diagnosis Date   Anginal pain ([place])    Coronary artery disease    angioplasty 9/13   Exertional dyspnea    "usually; today it was with nothing" (11/03/2012)   GERD (gastroesophageal reflux disease)    Hypercholesterolemia with hypertriglyceridemia    Hypertension    Kidney stones    "I've had them 5 times; lithotripsy 1st time; flushed out  all the others" (11/03/2012)   Obesity (BMI 30-39.9)    Pneumonia 2012   Sinus headache    "weekly" (11/03/2012)   Type II diabetes mellitus ([place]) 2007      Assessment: 66 year old female with unstable angina presented with chest pressure. Pharmacy consulted for management of heparin infusion for ACS. No anticoagulation noted PTA.   Pharmacy consulted to resume heparin gtt now s/p cardiac catheterization on 10/25. No overt bleeding reported at this time.   Goal of Therapy:  Heparin level 0.3-0.7 units/ml Monitor  platelets by anticoagulation protocol: Yes   Plan:  -Heparin infusion at 750 units/hr -Check heparin level ~ 8 hours after start of infusion -Daily CBC while on heparin infusion  Dawayne Cirri, PharmD, BCPS Clinical Pharmacist 10/04/2022 2:11 AM

## 2022-10-04 NOTE — Progress Notes (Signed)
  Progress Note    10/04/2022 7:47 AM 1 Day Post-Op  Subjective:  resting comfortably    Vitals:   10/04/22 0650 10/04/22 0737  BP: (!) 144/79 129/64  Pulse: 99 95  Resp: (!) 22 19  Temp:  98.9 F (37.2 C)  SpO2: 96% 97%    Physical Exam: Lungs:  nonlabored Extremities:  L 5th toe necrosis unchanged Abdomen:  soft, NT  CBC    Component Value Date/Time   WBC 8.8 10/04/2022 0132   RBC 3.30 (L) 10/04/2022 0132   HGB 10.3 (L) 10/04/2022 0132   HCT 31.2 (L) 10/04/2022 0132   PLT 286 10/04/2022 0132   MCV 94.5 10/04/2022 0132   MCH 31.2 10/04/2022 0132   MCHC 33.0 10/04/2022 0132   RDW 12.8 10/04/2022 0132   LYMPHSABS 2.4 10/03/2022 0516   MONOABS 1.1 (H) 10/03/2022 0516   EOSABS 0.3 10/03/2022 0516   BASOSABS 0.1 10/03/2022 0516    BMET    Component Value Date/Time   NA 137 10/04/2022 0132   K 3.9 10/04/2022 0132   CL 104 10/04/2022 0132   CO2 23 10/04/2022 0132   GLUCOSE 208 (H) 10/04/2022 0132   BUN 29 (H) 10/04/2022 0132   CREATININE 1.46 (H) 10/04/2022 0132   CREATININE 0.96 09/03/2012 1406   CALCIUM 8.8 (L) 10/04/2022 0132   GFRNONAA 39 (L) 10/04/2022 0132   GFRAA >90 05/29/2014 0450    INR    Component Value Date/Time   INR 1.0 10/03/2022 1046     Intake/Output Summary (Last 24 hours) at 10/04/2022 0747 Last data filed at 10/04/2022 0500 Gross per 24 hour  Intake 1700.48 ml  Output 600 ml  Net 1100.48 ml     Assessment/Plan:  66 y.o. female with necrotic L 5th toe   -Plans for angio tomorrow to re-evaluate runoff in LLE. Patient will be NPO at midnight. Consent orders have already been placed -Cr at 1.46 today. Received hydration overnight after heart cath -Stable necrotic L 5th toe  Vicente Serene, PA-C [facility] [phone] 10/04/2022 7:47 AM

## 2022-10-04 NOTE — Progress Notes (Signed)
VASCULAR LAB    Saphenous mapping has been performed.  See CV proc for preliminary results.   Kailia Starry, RVT 10/04/2022, 1:07 PM

## 2022-10-04 NOTE — Progress Notes (Addendum)
Rounding Note    Patient Name: Amanda Mendez Date of Encounter: 10/04/2022  [place] Cardiologist: Minus Breeding, MD   Subjective   Amanda Mendez reports mild CP. Otherwise comfortable and doing well. She understands the plan with vascular and anticipates CT SUR eval.  Inpatient Medications    Scheduled Meds:  aspirin  81 mg Oral Daily   DULoxetine  30 mg Oral Daily   insulin aspart  0-15 Units Subcutaneous TID AC & HS   insulin glargine-yfgn  12 Units Subcutaneous Daily   metoprolol tartrate  50 mg Oral BID   pantoprazole  40 mg Oral Daily   potassium chloride  40 mEq Oral BID   rosuvastatin  20 mg Oral Daily   sodium chloride flush  3 mL Intravenous Q12H   sodium chloride flush  3 mL Intravenous Q12H   Continuous Infusions:  sodium chloride 75 mL/hr at 10/04/22 4268   sodium chloride     sodium chloride     heparin     heparin 750 Units/hr (10/04/22 0226)   nitroGLYCERIN 65 mcg/min (10/04/22 0659)   PRN Meds: sodium chloride, sodium chloride, acetaminophen, heparin, hydrALAZINE, ondansetron **OR** ondansetron (ZOFRAN) IV, polyethylene glycol, sodium chloride flush, sodium chloride flush   Vital Signs    Vitals:   10/04/22 0618 10/04/22 0636 10/04/22 0650 10/04/22 0737  BP: 139/74 125/64 (!) 144/79 129/64  Pulse: 89 89 99 95  Resp: (!) 22 16 (!) 22 19  Temp:    98.9 F (37.2 C)  TempSrc:      SpO2: 97% 97% 96% 97%  Weight:      Height:        Intake/Output Summary (Last 24 hours) at 10/04/2022 0810 Last data filed at 10/04/2022 0500 Gross per 24 hour  Intake 1700.48 ml  Output 600 ml  Net 1100.48 ml      10/04/2022    6:10 AM 10/03/2022   10:38 AM 08/22/2022    3:26 PM  Last 3 Weights  Weight (lbs) 180 lb 177 lb 177 lb  Weight (kg) 81.647 kg 80.287 kg 80.287 kg      Telemetry    NSR - Personally Reviewed  ECG    NA - Personally Reviewed  Physical Exam   Vitals:   10/04/22 0650 10/04/22 0737  BP: (!) 144/79 129/64   Pulse: 99 95  Resp: (!) 22 19  Temp:  98.9 F (37.2 C)  SpO2: 96% 97%    GEN: No acute distress.   Neck: No sig JVD Cardiac: RRR, no murmurs, rubs, or gallops.  Respiratory: Clear to auscultation bilaterally. GI: Soft, nontender, non-distended  MS: No edema; No deformity. L toe -necrotic Vasc: palpable pulses Neuro:  Nonfocal  Psych: Normal affect   Labs    High Sensitivity Troponin:   Recent Labs  Lab 10/02/22 1529 10/02/22 1710 10/03/22 0158  TROPONINIHS 22* 22* 21*     Chemistry Recent Labs  Lab 10/02/22 1529 10/03/22 0516 10/04/22 0132  NA 135 138 137  K 4.0 3.2* 3.9  CL 95* 100 104  CO2 '28 27 23  '$ GLUCOSE 271* 156* 208*  BUN 32* 39* 29*  CREATININE 0.97 1.50* 1.46*  CALCIUM 9.0 9.1 8.8*  MG  --  1.8  --   PROT 7.6 6.6  --   ALBUMIN 3.8 3.3*  --   AST 43* 29  --   ALT 24 26  --   ALKPHOS 79 73  --   BILITOT 1.1 0.6  --  GFRNONAA >60 38* 39*  ANIONGAP '12 11 10    '$ Lipids No results for input(s): "CHOL", "TRIG", "HDL", "LABVLDL", "LDLCALC", "CHOLHDL" in the last 168 hours.  Hematology Recent Labs  Lab 10/02/22 1529 10/03/22 0516 10/04/22 0132  WBC 10.4 10.8* 8.8  RBC 4.20 3.77* 3.30*  HGB 13.2 11.9* 10.3*  HCT 39.1 35.2* 31.2*  MCV 93.1 93.4 94.5  MCH 31.4 31.6 31.2  MCHC 33.8 33.8 33.0  RDW 12.9 13.0 12.8  PLT 323 333 286   Thyroid No results for input(s): "TSH", "FREET4" in the last 168 hours.  BNPNo results for input(s): "BNP", "PROBNP" in the last 168 hours.  DDimer  Recent Labs  Lab 10/02/22 1949  DDIMER 1.03*     Radiology    CARDIAC CATHETERIZATION  Result Date: 10/03/2022   1st Mrg lesion is 10% stenosed. 1.  Severe multivessel disease consisting of chronic total occlusion of right coronary artery (which has a high anterior takeoff), proximal LAD, and first diagonal disease. 2.  Patent first obtuse marginal stent. 3.  LVEDP of 26 mmHg. Recommendation: Cardiothoracic surgery consultation; results discussed with Dr. Harl Bowie.    ECHOCARDIOGRAM COMPLETE  Result Date: 10/03/2022    ECHOCARDIOGRAM REPORT   Patient Name:   Amanda Mendez Date of Exam: 10/03/2022 Medical Rec #:  [id number]      Height:       62.0 in Accession #:    [id number]     Weight:       177.0 lb Date of Birth:  October 06, 1956      BSA:          1.815 m Patient Age:    66 years       BP:           136/68 mmHg Patient Gender: F              HR:           105 bpm. Exam Location:  Inpatient Procedure: 2D Echo, Cardiac Doppler, Color Doppler and Intracardiac            Opacification Agent Indications:    Chest Pain  History:        Patient has prior history of Echocardiogram examinations, most                 recent 09/24/2014. CHF, CAD, Prior Cardiac Surgery,                 Signs/Symptoms:Chest Pain, Dyspnea and Murmur; Risk                 Factors:Hypertension, Diabetes, Dyslipidemia and Former Smoker.  Sonographer:    Wenda Low Referring Phys: [id number] [facility]  Sonographer Comments: Image acquisition challenging due to respiratory motion. IMPRESSIONS  1. Left ventricular ejection fraction, by estimation, is 70 to 75%. The left ventricle has hyperdynamic function. The left ventricle has no regional wall motion abnormalities. There is mild concentric left ventricular hypertrophy. Left ventricular diastolic parameters are consistent with Grade I diastolic dysfunction (impaired relaxation).  2. Right ventricular systolic function is normal. The right ventricular size is normal.  3. The mitral valve is normal in structure. No evidence of mitral valve regurgitation. No evidence of mitral stenosis.  4. The aortic valve is normal in structure. Aortic valve regurgitation is not visualized. No aortic stenosis is present.  5. The inferior vena cava is normal in size with greater than 50% respiratory variability, suggesting right atrial pressure of 3 mmHg. FINDINGS  Left  Ventricle: Left ventricular ejection fraction, by estimation, is 70 to 75%. The left ventricle  has hyperdynamic function. The left ventricle has no regional wall motion abnormalities. Definity contrast agent was given IV to delineate the left ventricular endocardial borders. The left ventricular internal cavity size was normal in size. There is mild concentric left ventricular hypertrophy. Left ventricular diastolic parameters are consistent with Grade I diastolic dysfunction (impaired relaxation). Right Ventricle: The right ventricular size is normal. No increase in right ventricular wall thickness. Right ventricular systolic function is normal. Left Atrium: Left atrial size was normal in size. Right Atrium: Right atrial size was normal in size. Pericardium: There is no evidence of pericardial effusion. Mitral Valve: The mitral valve is normal in structure. No evidence of mitral valve regurgitation. No evidence of mitral valve stenosis. MV peak gradient, 5.8 mmHg. The mean mitral valve gradient is 2.0 mmHg. Tricuspid Valve: The tricuspid valve is normal in structure. Tricuspid valve regurgitation is not demonstrated. No evidence of tricuspid stenosis. Aortic Valve: The aortic valve is normal in structure. Aortic valve regurgitation is not visualized. No aortic stenosis is present. Aortic valve mean gradient measures 7.0 mmHg. Aortic valve peak gradient measures 13.8 mmHg. Aortic valve area, by VTI measures 2.64 cm. Pulmonic Valve: The pulmonic valve was normal in structure. Pulmonic valve regurgitation is not visualized. No evidence of pulmonic stenosis. Aorta: The aortic root is normal in size and structure. Venous: The inferior vena cava is normal in size with greater than 50% respiratory variability, suggesting right atrial pressure of 3 mmHg. IAS/Shunts: No atrial level shunt detected by color flow Doppler.  LEFT VENTRICLE PLAX 2D LVIDd:         4.00 cm   Diastology LVIDs:         2.40 cm   LV e' medial:    6.20 cm/s LV PW:         1.20 cm   LV E/e' medial:  10.7 LV IVS:        1.20 cm   LV e' lateral:    5.11 cm/s LVOT diam:     1.80 cm   LV E/e' lateral: 13.0 LV SV:         74 LV SV Index:   41 LVOT Area:     2.54 cm  RIGHT VENTRICLE RV Basal diam:  3.10 cm RV Mid diam:    2.30 cm RV S prime:     14.60 cm/s TAPSE (M-mode): 2.2 cm LEFT ATRIUM             Index        RIGHT ATRIUM           Index LA diam:        3.50 cm 1.93 cm/m   RA Area:     11.50 cm LA Vol (A2C):   43.9 ml 24.19 ml/m  RA Volume:   20.80 ml  11.46 ml/m LA Vol (A4C):   46.9 ml 25.84 ml/m LA Biplane Vol: 48.0 ml 26.45 ml/m  AORTIC VALVE AV Area (Vmax):    2.19 cm AV Area (Vmean):   2.13 cm AV Area (VTI):     2.64 cm AV Vmax:           186.00 cm/s AV Vmean:          119.000 cm/s AV VTI:            0.281 m AV Peak Grad:      13.8 mmHg AV Mean Grad:  7.0 mmHg LVOT Vmax:         160.00 cm/s LVOT Vmean:        99.800 cm/s LVOT VTI:          0.292 m LVOT/AV VTI ratio: 1.04  AORTA Ao Root diam: 3.40 cm Ao Asc diam:  3.40 cm MITRAL VALVE MV Area (PHT): 5.66 cm     SHUNTS MV Area VTI:   4.17 cm     Systemic VTI:  0.29 m MV Peak grad:  5.8 mmHg     Systemic Diam: 1.80 cm MV Mean grad:  2.0 mmHg MV Vmax:       1.20 m/s MV Vmean:      56.2 cm/s MV Decel Time: 134 msec MV E velocity: 66.40 cm/s MV A velocity: 115.00 cm/s MV E/A ratio:  0.58 Mihai Croitoru MD Electronically signed by Sanda Klein MD Signature Date/Time: 10/03/2022/12:31:26 PM    Final    CT Angio Chest Pulmonary Embolism (PE) W or WO Contrast  Result Date: 10/02/2022 CLINICAL DATA:  Chest pain and positive D-dimer, initial encounter EXAM: CT ANGIOGRAPHY CHEST WITH CONTRAST TECHNIQUE: Multidetector CT imaging of the chest was performed using the standard protocol during bolus administration of intravenous contrast. Multiplanar CT image reconstructions and MIPs were obtained to evaluate the vascular anatomy. RADIATION DOSE REDUCTION: This exam was performed according to the departmental dose-optimization program which includes automated exposure control, adjustment of the mA  and/or kV according to patient size and/or use of iterative reconstruction technique. CONTRAST:  24m OMNIPAQUE IOHEXOL 350 MG/ML SOLN COMPARISON:  Chest x-ray from earlier in the same day. FINDINGS: Cardiovascular: Thoracic aorta demonstrates atherosclerotic calcifications without aneurysmal dilatation or dissection. The pulmonary artery is well visualized within normal branching pattern. No filling defect to suggest pulmonary embolism is noted. Mild coronary calcifications are noted. Mediastinum/Nodes: Thoracic inlet is within normal limits. The esophagus as visualized is within normal limits. No sizable hilar or mediastinal adenopathy is noted. Lungs/Pleura: Lungs are well aerated bilaterally. No focal infiltrate or sizable effusion is noted. Upper Abdomen: Visualized upper abdomen is within normal limits. Musculoskeletal: Degenerative changes of the thoracic spine are noted. No acute rib abnormality is noted. No compression deformity is seen. Review of the MIP images confirms the above findings. IMPRESSION: No evidence of pulmonary emboli. No acute abnormality noted. Aortic Atherosclerosis (ICD10-I70.0). Electronically Signed   By: MInez CatalinaM.D.   On: 10/02/2022 21:56   DG Chest Port 1 View  Result Date: 10/02/2022 CLINICAL DATA:  Chest pain on and off for a while. EXAM: PORTABLE CHEST 1 VIEW COMPARISON:  05/27/2021. FINDINGS: Cardiac silhouette is normal in size and configuration. No mediastinal or hilar masses. Clear lungs.  No pleural effusion or pneumothorax. Skeletal structures are grossly intact. IMPRESSION: No active disease. Electronically Signed   By: DLajean ManesM.D.   On: 10/02/2022 15:50   NM PET CT CARDIAC PERFUSION MULTI W/ABSOLUTE BLOODFLOW  Result Date: 10/02/2022   Small, reversible, moderate defect present in the basal inferolateral segment consistent with ischemia. EF drops with stress (68->63%). TID is present (1.19). Severely reduced coronary flow reserve (1.38). Findings are  concerning for either balanced ischemia in the setting of 3-vessel CAD versus microvascular dysfunction. The patient reported chest pain on and off for the past 4 days occuring at rest. She was directed to the ER for work-up of possible unstable angina.   LV perfusion is abnormal. There is evidence of ischemia. There is no evidence of infarction. Defect 1: There is  a small defect with moderate reduction in uptake present in the basal inferolateral location(s) that is reversible. There is normal wall motion in the defect area. Consistent with ischemia.   Rest left ventricular function is normal. Rest EF: 68 %. Stress left ventricular function is normal. Stress EF: 63 %. End diastolic cavity size is normal.   Myocardial blood flow was computed to be 1.12m/g/min at rest and 1.577mg/min at stress. Global myocardial blood flow reserve was 1.38 and was highly abnormal.   Coronary calcium was absent on the attenuation correction CT images.   Findings are consistent with ischemia. The study is high risk. Electronically signed by WeEleonore ChiquitoMD ___________________________________________________________________________ ________ EXJasmine DecemberOVER-READ INTERPRETATION  PET-CT CHEST The following report is an over-read performed by radiologist Dr. [facility]osine [facility], [facility] on 10/02/2022. This over-read does not include interpretation of cardiac or coronary anatomy or pathology. The cardiac PET and cardiac CT interpretation by the cardiologist is to be attached. COMPARISON:  None. FINDINGS: No evidence for lymphadenopathy within the visualized mediastinum or hilar regions. The visualized lung parenchyma shows no suspicious pulmonary nodule or mass. No focal airspace consolidation. No effusion. Visualized portions of the upper abdomen are unremarkable. No suspicious lytic or sclerotic osseous abnormality. IMPRESSION: No acute or clinically significant extracardiac findings. Electronically Signed   By: ErMisty StanleyM.D.   On: 10/02/2022 15:17   Cardiac Studies   TTE 10/03/2022  1. Left ventricular ejection fraction, by estimation, is 70 to 75%. The  left ventricle has hyperdynamic function. The left ventricle has no  regional wall motion abnormalities. There is mild concentric left  ventricular hypertrophy. Left ventricular  diastolic parameters are consistent with Grade I diastolic dysfunction  (impaired relaxation).   2. Right ventricular systolic function is normal. The right ventricular  size is normal.   3. The mitral valve is normal in structure. No evidence of mitral valve  regurgitation. No evidence of mitral stenosis.   4. The aortic valve is normal in structure. Aortic valve regurgitation is  not visualized. No aortic stenosis is present.   5. The inferior vena cava is normal in size with greater than 50%  respiratory variability, suggesting right atrial pressure of 3 mmHg.   LHC 10/03/2022 Left Anterior Descending  Prox LAD lesion is 80% stenosed with 90% stenosed side Jeriyah Granlund in 1st Diag.    Left Circumflex    First Obtuse Marginal Jasminne Mendez  1st Mrg lesion is 10% stenosed. The lesion was previously treated .    Right Coronary Artery  Prox RCA lesion is 100% stenosed.    Third Right Posterolateral Amanda Mendez  Collaterals  3rd RPL filled by collaterals from 3rd Mrg.    Collaterals  3rd RPL filled by collaterals from 3rd Mrg.      Patient Profile     Amanda Mendez a 89.o. female with a hx of CAD s/p stenting to OM in 2013, hypertension, diabetes, hyperlipidemia and intermittent renal insufficiency who is being seen 10/03/2022 for the evaluation of chest pain at the request of Dr. ShCyd SilenceAssessment & Plan    MVD:  LHC 10/25-patent Om1 stent. 80% LAD/90% D1 at the bifurcation. RCA CTO with L-R collaterals. Recommendation for CT SUR evaluation to consider CABG.  TC 275, TG too high for LDL calc 557, Hdl 43. - continue asa 81 mg daily -increased crestor to 40 mg daily;  plan for recheck lipids in follow up. Likely will need zetia , [facility]  LDL target < 55 mg/dL - continue metop tartrate 50 mg BID - continue heparin gtt - continue nitro gtt  Necrotic fifth L toe: planned for angiography tomorrow since she received contrast from her Ignacio 10/25. Likely may require an amputation. Vascular sx following, appreciate their help  DM2: poorly controlled. A1c 11. Lost coverage for Januvia contributing to increase. Would appreciate help with primary team on this  For questions or updates, please contact [place] Please consult [email or web address] for contact info under    Time Spent Directly with Patient:  I have spent a total of 25 minutes with the patient reviewing hospital notes, telemetry, EKGs, labs and examining the patient as well as establishing an assessment and plan that was discussed personally with the patient.  > 50% of time was spent in direct patient care.       Signed, Janina Mayo, MD  10/04/2022, 8:10 AM

## 2022-10-04 NOTE — Consult Note (Addendum)
[address]       [place] [address]             [phone]        Ambre Proehl [place] Medical Record #[id number] Date of Birth: 1956/02/18  Referring:[place] Primary Care: Redmond School, MD Primary Cardiologist:James Hochrein, MD  Chief Complaint:    Chief Complaint  Patient presents with   Chest Pain    History of Present Illness:      Amanda Mendez is a 66 yo obese female with history of CAD S/p PCI with stent to OM which remains patent, DM uncontrolled, HTN, and CHF.  She presented to Dr. Percival Spanish for routine evaluation on 08/22/2022.  At that visit the patient admitted to experiencing chest discomfort over the past several months.  She is unable to relate this to particular activity.  She is mostly sedentary ambulating with a cane.  She does state it was similar to her chest pain in the past.  She denies associated N/V, but does have some shortness of breath.  It was felt she should undergo PET CT scan to assess for worsening CAD.  This was performed on 10/02/2022  and was suggestive of 3V CAD and the patient experienced angina symptoms.  Due to this it was recommended she report to the nearest emergency room for evaluation.  Workup was negative for NSTEMI/STEMI.Marland Kitchen However it was recommended the patient be admitted to [facility] for further cardiac evaluation.  She underwent catheterization by Dr. Ali Lowe on 10/25 and was found to have multivessel CAD with a patent stent to her OM.  It was felt coronary bypass grafting would be indicated and Cardiothoracic surgery consultation was requested.  Currently the patient in chest pain free.  She does admit to having some shortness of breath.  She has long standing history of DM currently of which is poorly controlled, A1c obtained is 11.  The patient states this used to be under better control, however her insurance stopped paying for Jardiance.  Due to this it was recommended she take insulin.  The  patient initially refused, but has recently agreed and has been using Lantus for the past month.  Overall she feels she is active, however she can not walk up a flight of stairs without experiencing symptoms.  She does have pain along her left leg.  Workup for this back in 2022 felt this was likely due to Neuropathy.  However, she now has an issue with her "little toe."  She is a former smoker having smoked 1 ppd off and on.  However, she has been smoke free for the past 10 years.  She states her mother and father had heart issues in her 25s.  Patient and her daughter in law request assistance with getting Dexcom monitor for DM.  The patient currently lives with son and his family.  Current Activity/ Functional Status: Patient is independent with mobility/ambulation, transfers, ADL's, IADL's.   Zubrod Score: At the time of surgery this patient's most appropriate activity status/level should be described as: '[]'$     0    Normal activity, no symptoms '[]'$     1    Restricted in physical strenuous activity but ambulatory, able to do out light work '[x]'$     2    Ambulatory and capable of self care, unable to do work activities, up and about                 more  than 50%  Of the time                            '[]'$     3    Only limited self care, in bed greater than 50% of waking hours '[]'$     4    Completely disabled, no self care, confined to bed or chair '[]'$     5    Moribund  Past Medical History:  Diagnosis Date   Anginal pain ([place])    Coronary artery disease    angioplasty 9/13   Exertional dyspnea    "usually; today it was with nothing" (11/03/2012)   GERD (gastroesophageal reflux disease)    Hypercholesterolemia with hypertriglyceridemia    Hypertension    Kidney stones    "I've had them 5 times; lithotripsy 1st time; flushed out  all the others" (11/03/2012)   Obesity (BMI 30-39.9)    Pneumonia 2012   Sinus headache    "weekly" (11/03/2012)   Type II diabetes mellitus ([place]) 2007    Past  Surgical History:  Procedure Laterality Date   ABDOMINAL AORTOGRAM W/LOWER EXTREMITY Left 09/12/2021   Procedure: ABDOMINAL AORTOGRAM W/LOWER EXTREMITY;  Surgeon: Serafina Mitchell, MD;  Location: [place] [facility];  Service: Cardiovascular;  Laterality: Left;   CARDIAC CATHETERIZATION  11/25/29013   CORONARY ANGIOPLASTY WITH STENT PLACEMENT  08/2012   "1"   HEMATOMA EVACUATION  09/22/2012   Procedure: EVACUATION HEMATOMA;  Surgeon: Wynonia Sours, MD;  Location: [place];  Service: Orthopedics;  Laterality: Right;  Evacuation Hematoma right hand   LEFT AND RIGHT HEART CATHETERIZATION WITH CORONARY ANGIOGRAM N/A 08/14/2012   Procedure: LEFT AND RIGHT HEART CATHETERIZATION WITH CORONARY ANGIOGRAM;  Surgeon: Minus Breeding, MD;  Location[facility];  Service: Cardiovascular;  Laterality: N/A;   LEFT HEART CATH AND CORONARY ANGIOGRAPHY N/A 10/03/2022   Procedure: LEFT HEART CATH AND CORONARY ANGIOGRAPHY;  Surgeon: Early Osmond, MD;  Location: [place] CV [facility];  Service: Cardiovascular;  Laterality: N/A;   LEFT HEART CATHETERIZATION WITH CORONARY ANGIOGRAM N/A 11/03/2012   Procedure: LEFT HEART CATHETERIZATION WITH CORONARY ANGIOGRAM;  Surgeon: Jolaine Artist, MD;  Location: [facility] CATH LAB;  Service: Cardiovascular;  Laterality: N/A;   LITHOTRIPSY  1990's   PERCUTANEOUS CORONARY STENT INTERVENTION (PCI-S)  08/14/2012   Procedure: PERCUTANEOUS CORONARY STENT INTERVENTION (PCI-S);  Surgeon: Minus Breeding, MD;  Location: [facility] CATH LAB;  Service: Cardiovascular;;   SKIN BIOPSY  2012   "off my back; it was nothing fatal; infection caused it" (11/03/2012)   TUBAL LIGATION  1980    Social History   Tobacco Use  Smoking Status Former   Packs/day: 1.50   Years: 37.00   Total pack years: 55.50   Types: Cigarettes   Quit date: 06/11/2012   Years since quitting: 10.3  Smokeless Tobacco Never    Social History   Substance and Sexual Activity  Alcohol Use Never   Comment:  11/03/2012 "aien't drank none in ~ 10 yr; then it was just an occasional daquiri on vacation"     Allergies  Allergen Reactions   Dapagliflozin Itching   Codeine Nausea And Vomiting    But has tolerated morphine    Current Facility-Administered Medications  Medication Dose Route Frequency Provider Last Rate Last Admin   0.9 %  sodium chloride infusion   Intravenous Continuous Cherene Altes, MD 75 mL/hr at 10/04/22 1038 Infusion Verify at 10/04/22 1038  0.9 %  sodium chloride infusion  250 mL Intravenous PRN Bhagat, Bhavinkumar, PA       0.9 %  sodium chloride infusion  250 mL Intravenous PRN Early Osmond, MD       acetaminophen (TYLENOL) tablet 650 mg  650 mg Oral Q4H PRN Early Osmond, MD   650 mg at 10/04/22 0636   aspirin chewable tablet 81 mg  81 mg Oral Daily Early Osmond, MD   81 mg at 10/04/22 0848   DULoxetine (CYMBALTA) DR capsule 30 mg  30 mg Oral Daily Vernelle Emerald, MD   30 mg at 10/04/22 0849   heparin 25000 UT/250ML infusion            heparin ADULT infusion 100 units/mL (25000 units/285m)  750 Units/hr Intravenous Continuous Rudisill, TJodean Lima RPH 7.5 mL/hr at 10/04/22 1038 750 Units/hr at 10/04/22 1038   hydrALAZINE (APRESOLINE) injection 10 mg  10 mg Intravenous Q6H PRN Shalhoub, GSherryll Burger MD       insulin aspart (novoLOG) injection 0-15 Units  0-15 Units Subcutaneous TID AC & HS Shalhoub, GSherryll Burger MD   3 Units at 10/04/22 06295  insulin glargine-yfgn (SEMGLEE) injection 12 Units  12 Units Subcutaneous Daily SVernelle Emerald MD   12 Units at 10/04/22 0850   metoprolol tartrate (LOPRESSOR) tablet 50 mg  50 mg Oral BID SVernelle Emerald MD   50 mg at 10/04/22 0849   nitroGLYCERIN 50 mg in dextrose 5 % 250 mL (0.2 mg/mL) infusion  0-200 mcg/min Intravenous Continuous SVernelle Emerald MD 19.5 mL/hr at 10/04/22 1038 65 mcg/min at 10/04/22 1038   ondansetron (ZOFRAN) tablet 4 mg  4 mg Oral Q6H PRN Shalhoub, GSherryll Burger MD       Or   ondansetron  ([facility] injection 4 mg  4 mg Intravenous Q6H PRN Shalhoub, GSherryll Burger MD       pantoprazole (PROTONIX) EC tablet 40 mg  40 mg Oral Daily Shalhoub, GSherryll Burger MD   40 mg at 10/04/22 0849   polyethylene glycol (MIRALAX / GLYCOLAX) packet 17 g  17 g Oral Daily PRN Shalhoub, GSherryll Burger MD       rosuvastatin (CRESTOR) tablet 40 mg  40 mg Oral Daily BJanina Mayo MD   40 mg at 10/04/22 0848   sodium chloride flush (NS) 0.9 % injection 3 mL  3 mL Intravenous Q12H Bhagat, Bhavinkumar, PA   3 mL at 10/03/22 2144   sodium chloride flush (NS) 0.9 % injection 3 mL  3 mL Intravenous PRN Bhagat, Bhavinkumar, PA       sodium chloride flush (NS) 0.9 % injection 3 mL  3 mL Intravenous Q12H TEarly Osmond MD   3 mL at 10/03/22 2144   sodium chloride flush (NS) 0.9 % injection 3 mL  3 mL Intravenous PRN TEarly Osmond MD        Medications Prior to Admission  Medication Sig Dispense Refill Last Dose   acetaminophen (TYLENOL) 500 MG tablet Take 500 mg by mouth every 4 (four) hours as needed for moderate pain or mild pain.   unk   aspirin EC 81 MG tablet Take 81 mg by mouth daily. Swallow whole.   10/01/2022   Cholecalciferol (VITAMIN D3) 1.25 MG (50000 UT) TABS Take 5,000 Units by mouth.   10/01/2022   DULoxetine (CYMBALTA) 30 MG capsule Take 1 capsule (30 mg total) by mouth daily. 30 capsule 0 10/01/2022   glimepiride (AMARYL)  2 MG tablet Take 2 mg by mouth 3 (three) times daily.   10/01/2022   LANTUS SOLOSTAR 100 UNIT/ML Solostar Pen Inject into the skin.   10/01/2022   losartan-hydrochlorothiazide (HYZAAR) 100-25 MG tablet Take 1 tablet by mouth daily. 90 tablet 3 10/01/2022   metoprolol (LOPRESSOR) 50 MG tablet Take 50 mg by mouth 2 (two) times daily.   10/01/2022 at 2100   omeprazole (PRILOSEC) 20 MG capsule Take 20 mg by mouth daily.   10/01/2022   Pyridoxine HCl (VITAMIN B6 PO) Take 1 capsule by mouth daily.   10/01/2022   rosuvastatin (CRESTOR) 20 MG tablet Take 1 tablet (20 mg total) by mouth daily.  90 tablet 3 10/01/2022    Family History  Problem Relation Age of Onset   Heart failure Mother        Died in her 70s, had angina starting in her 52s   Crohn's disease Brother      Review of Systems:   ROS     Cardiac Review of Systems: Y or  [    ]= no  Chest Pain [ Y   ]  Resting SOB [  Y ] Exertional SOB  [ Y ]  Orthopnea [  ]   Pedal Edema [   ]    Palpitations [ N ] Syncope  [  ]   Presyncope [   ]  General Review of Systems: [Y] = yes [  ]=no Constitional: recent weight change [  ]; anorexia [  ]; fatigue [Y  ]; nausea Aqua.Slicker  ]; night sweats [  ]; fever [  ]; or chills [  ]                                                               Dental: Last Dentist visit:   Eye : blurred vision [  ]; diplopia [   ]; vision changes [ N ];  Amaurosis fugax[  ]; Resp: cough [  ];  wheezing[  ];  hemoptysis[  ]; shortness of breath[ N ]; paroxysmal nocturnal dyspnea[  ]; dyspnea on exertion[  ]; or orthopnea[  ];  GI:  gallstones[  ], vomiting[  ];  dysphagia[  ]; melena[  ];  hematochezia [  ]; heartburn[  ];   Hx of  Colonoscopy[  ]; GU: kidney stones [  ]; hematuria[  ];   dysuria [  ];  nocturia[  ];  history of     obstruction [  ]; urinary frequency [  ]             Skin: rash, swelling[ N ];, hair loss[  ];  peripheral edema[Y,trace  ];  or itching[  ]; Musculosketetal: myalgias[  ];  joint swelling[  ];  joint erythema[  ];  joint pain[  ];  back pain[  ];  Heme/Lymph: bruising[  ];  bleeding[  ];  anemia[  ];  Neuro: TIA[  ];  headaches[  ];  stroke[ N ];  vertigo[  ];  seizures[  ];   paresthesias[  ];  difficulty walking[ Y ];  Psych:depression[  ]; anxiety[  ];  Endocrine: diabetes[ Y, poorly controlled ];  thyroid dysfunction[  ]; Physical Exam: BP 129/64   Pulse 95  Temp 98.9 F (37.2 C)   Resp 19   Ht '5\' 1"'$  (1.549 m)   Wt 81.6 kg   SpO2 97%   BMI 34.01 kg/m    General appearance: alert, cooperative, and no distress Head: Normocephalic, without obvious abnormality,  atraumatic Neck: no adenopathy, no carotid bruit, no JVD, supple, symmetrical, trachea midline, and thyroid not enlarged, symmetric, no tenderness/mass/nodules Resp: clear to auscultation bilaterally Cardio: regular rate and rhythm GI: soft, non-tender; bowel sounds normal; no masses,  no organomegaly Extremities: no varicosities noted, no evidence of PVD, + gangrenous left 5th digit Neurologic: Grossly normal  Diagnostic Studies & Laboratory data:     Recent Radiology Findings:   CARDIAC CATHETERIZATION  Result Date: 10/03/2022   1st Mrg lesion is 10% stenosed. 1.  Severe multivessel disease consisting of chronic total occlusion of right coronary artery (which has a high anterior takeoff), proximal LAD, and first diagonal disease. 2.  Patent first obtuse marginal stent. 3.  LVEDP of 26 mmHg. Recommendation: Cardiothoracic surgery consultation; results discussed with Dr. Harl Bowie.   ECHOCARDIOGRAM COMPLETE  Result Date: 10/03/2022    ECHOCARDIOGRAM REPORT   Patient Name:   Amanda Mendez Date of Exam: 10/03/2022 Medical Rec #:  [id number]      Height:       62.0 in Accession #:    [id number]     Weight:       177.0 lb Date of Birth:  04-Jun-1956      BSA:          1.815 m Patient Age:    30 years       BP:           136/68 mmHg Patient Gender: F              HR:           105 bpm. Exam Location:  Inpatient Procedure: 2D Echo, Cardiac Doppler, Color Doppler and Intracardiac            Opacification Agent Indications:    Chest Pain  History:        Patient has prior history of Echocardiogram examinations, most                 recent 09/24/2014. CHF, CAD, Prior Cardiac Surgery,                 Signs/Symptoms:Chest Pain, Dyspnea and Murmur; Risk                 Factors:Hypertension, Diabetes, Dyslipidemia and Former Smoker.  Sonographer:    Wenda Low Referring Phys: [id number] [facility]  Sonographer Comments: Image acquisition challenging due to respiratory motion. IMPRESSIONS  1. Left  ventricular ejection fraction, by estimation, is 70 to 75%. The left ventricle has hyperdynamic function. The left ventricle has no regional wall motion abnormalities. There is mild concentric left ventricular hypertrophy. Left ventricular diastolic parameters are consistent with Grade I diastolic dysfunction (impaired relaxation).  2. Right ventricular systolic function is normal. The right ventricular size is normal.  3. The mitral valve is normal in structure. No evidence of mitral valve regurgitation. No evidence of mitral stenosis.  4. The aortic valve is normal in structure. Aortic valve regurgitation is not visualized. No aortic stenosis is present.  5. The inferior vena cava is normal in size with greater than 50% respiratory variability, suggesting right atrial pressure of 3 mmHg. FINDINGS  Left Ventricle: Left ventricular ejection fraction, by estimation, is 70 to 75%. The left  ventricle has hyperdynamic function. The left ventricle has no regional wall motion abnormalities. Definity contrast agent was given IV to delineate the left ventricular endocardial borders. The left ventricular internal cavity size was normal in size. There is mild concentric left ventricular hypertrophy. Left ventricular diastolic parameters are consistent with Grade I diastolic dysfunction (impaired relaxation). Right Ventricle: The right ventricular size is normal. No increase in right ventricular wall thickness. Right ventricular systolic function is normal. Left Atrium: Left atrial size was normal in size. Right Atrium: Right atrial size was normal in size. Pericardium: There is no evidence of pericardial effusion. Mitral Valve: The mitral valve is normal in structure. No evidence of mitral valve regurgitation. No evidence of mitral valve stenosis. MV peak gradient, 5.8 mmHg. The mean mitral valve gradient is 2.0 mmHg. Tricuspid Valve: The tricuspid valve is normal in structure. Tricuspid valve regurgitation is not  demonstrated. No evidence of tricuspid stenosis. Aortic Valve: The aortic valve is normal in structure. Aortic valve regurgitation is not visualized. No aortic stenosis is present. Aortic valve mean gradient measures 7.0 mmHg. Aortic valve peak gradient measures 13.8 mmHg. Aortic valve area, by VTI measures 2.64 cm. Pulmonic Valve: The pulmonic valve was normal in structure. Pulmonic valve regurgitation is not visualized. No evidence of pulmonic stenosis. Aorta: The aortic root is normal in size and structure. Venous: The inferior vena cava is normal in size with greater than 50% respiratory variability, suggesting right atrial pressure of 3 mmHg. IAS/Shunts: No atrial level shunt detected by color flow Doppler.  LEFT VENTRICLE PLAX 2D LVIDd:         4.00 cm   Diastology LVIDs:         2.40 cm   LV e' medial:    6.20 cm/s LV PW:         1.20 cm   LV E/e' medial:  10.7 LV IVS:        1.20 cm   LV e' lateral:   5.11 cm/s LVOT diam:     1.80 cm   LV E/e' lateral: 13.0 LV SV:         74 LV SV Index:   41 LVOT Area:     2.54 cm  RIGHT VENTRICLE RV Basal diam:  3.10 cm RV Mid diam:    2.30 cm RV S prime:     14.60 cm/s TAPSE (M-mode): 2.2 cm LEFT ATRIUM             Index        RIGHT ATRIUM           Index LA diam:        3.50 cm 1.93 cm/m   RA Area:     11.50 cm LA Vol (A2C):   43.9 ml 24.19 ml/m  RA Volume:   20.80 ml  11.46 ml/m LA Vol (A4C):   46.9 ml 25.84 ml/m LA Biplane Vol: 48.0 ml 26.45 ml/m  AORTIC VALVE AV Area (Vmax):    2.19 cm AV Area (Vmean):   2.13 cm AV Area (VTI):     2.64 cm AV Vmax:           186.00 cm/s AV Vmean:          119.000 cm/s AV VTI:            0.281 m AV Peak Grad:      13.8 mmHg AV Mean Grad:      7.0 mmHg LVOT Vmax:  160.00 cm/s LVOT Vmean:        99.800 cm/s LVOT VTI:          0.292 m LVOT/AV VTI ratio: 1.04  AORTA Ao Root diam: 3.40 cm Ao Asc diam:  3.40 cm MITRAL VALVE MV Area (PHT): 5.66 cm     SHUNTS MV Area VTI:   4.17 cm     Systemic VTI:  0.29 m MV Peak grad:   5.8 mmHg     Systemic Diam: 1.80 cm MV Mean grad:  2.0 mmHg MV Vmax:       1.20 m/s MV Vmean:      56.2 cm/s MV Decel Time: 134 msec MV E velocity: 66.40 cm/s MV A velocity: 115.00 cm/s MV E/A ratio:  0.58 Mihai Croitoru MD Electronically signed by Sanda Klein MD Signature Date/Time: 10/03/2022/12:31:26 PM    Final    CT Angio Chest Pulmonary Embolism (PE) W or WO Contrast  Result Date: 10/02/2022 CLINICAL DATA:  Chest pain and positive D-dimer, initial encounter EXAM: CT ANGIOGRAPHY CHEST WITH CONTRAST TECHNIQUE: Multidetector CT imaging of the chest was performed using the standard protocol during bolus administration of intravenous contrast. Multiplanar CT image reconstructions and MIPs were obtained to evaluate the vascular anatomy. RADIATION DOSE REDUCTION: This exam was performed according to the departmental dose-optimization program which includes automated exposure control, adjustment of the mA and/or kV according to patient size and/or use of iterative reconstruction technique. CONTRAST:  62m OMNIPAQUE IOHEXOL 350 MG/ML SOLN COMPARISON:  Chest x-ray from earlier in the same day. FINDINGS: Cardiovascular: Thoracic aorta demonstrates atherosclerotic calcifications without aneurysmal dilatation or dissection. The pulmonary artery is well visualized within normal branching pattern. No filling defect to suggest pulmonary embolism is noted. Mild coronary calcifications are noted. Mediastinum/Nodes: Thoracic inlet is within normal limits. The esophagus as visualized is within normal limits. No sizable hilar or mediastinal adenopathy is noted. Lungs/Pleura: Lungs are well aerated bilaterally. No focal infiltrate or sizable effusion is noted. Upper Abdomen: Visualized upper abdomen is within normal limits. Musculoskeletal: Degenerative changes of the thoracic spine are noted. No acute rib abnormality is noted. No compression deformity is seen. Review of the MIP images confirms the above findings.  IMPRESSION: No evidence of pulmonary emboli. No acute abnormality noted. Aortic Atherosclerosis (ICD10-I70.0). Electronically Signed   By: MInez CatalinaM.D.   On: 10/02/2022 21:56   DG Chest Port 1 View  Result Date: 10/02/2022 CLINICAL DATA:  Chest pain on and off for a while. EXAM: PORTABLE CHEST 1 VIEW COMPARISON:  05/27/2021. FINDINGS: Cardiac silhouette is normal in size and configuration. No mediastinal or hilar masses. Clear lungs.  No pleural effusion or pneumothorax. Skeletal structures are grossly intact. IMPRESSION: No active disease. Electronically Signed   By: DLajean ManesM.D.   On: 10/02/2022 15:50   NM PET CT CARDIAC PERFUSION MULTI W/ABSOLUTE BLOODFLOW  Result Date: 10/02/2022   Small, reversible, moderate defect present in the basal inferolateral segment consistent with ischemia. EF drops with stress (68->63%). TID is present (1.19). Severely reduced coronary flow reserve (1.38). Findings are concerning for either balanced ischemia in the setting of 3-vessel CAD versus microvascular dysfunction. The patient reported chest pain on and off for the past 4 days occuring at rest. She was directed to the ER for work-up of possible unstable angina.   LV perfusion is abnormal. There is evidence of ischemia. There is no evidence of infarction. Defect 1: There is a small defect with moderate reduction in uptake present in the basal  inferolateral location(s) that is reversible. There is normal wall motion in the defect area. Consistent with ischemia.   Rest left ventricular function is normal. Rest EF: 68 %. Stress left ventricular function is normal. Stress EF: 63 %. End diastolic cavity size is normal.   Myocardial blood flow was computed to be 1.85m/g/min at rest and 1.557mg/min at stress. Global myocardial blood flow reserve was 1.38 and was highly abnormal.   Coronary calcium was absent on the attenuation correction CT images.   Findings are consistent with ischemia. The study is high risk.  Electronically signed by WeEleonore ChiquitoMD ___________________________________________________________________________ ________ EXJasmine DecemberOVER-READ INTERPRETATION  PET-CT CHEST The following report is an over-read performed by radiologist Dr. [facility] on 10/02/2022. This over-read does not include interpretation of cardiac or coronary anatomy or pathology. The cardiac PET and cardiac CT interpretation by the cardiologist is to be attached. COMPARISON:  None. FINDINGS: No evidence for lymphadenopathy within the visualized mediastinum or hilar regions. The visualized lung parenchyma shows no suspicious pulmonary nodule or mass. No focal airspace consolidation. No effusion. Visualized portions of the upper abdomen are unremarkable. No suspicious lytic or sclerotic osseous abnormality. IMPRESSION: No acute or clinically significant extracardiac findings. Electronically Signed   By: ErMisty Stanley.D.   On: 10/02/2022 15:17    I have independently reviewed the above radiologic studies and discussed with the patient   Recent Lab Findings: Lab Results  Component Value Date   WBC 8.8 10/04/2022   HGB 10.3 (L) 10/04/2022   HCT 31.2 (L) 10/04/2022   PLT 286 10/04/2022   GLUCOSE 208 (H) 10/04/2022   CHOL 275 (H) 08/14/2012   TRIG 557 (H) 08/14/2012   HDL 43 08/14/2012   LDLCALC UNABLE TO CALCULATE IF TRIGLYCERIDE OVER 400 mg/dL 08/14/2012   ALT 26 10/03/2022   AST 29 10/03/2022   NA 137 10/04/2022   K 3.9 10/04/2022   CL 104 10/04/2022   CREATININE 1.46 (H) 10/04/2022   BUN 29 (H) 10/04/2022   CO2 23 10/04/2022   TSH 3.913 09/12/2021   INR 1.0 10/03/2022   HGBA1C 11.3 (H) 10/03/2022    Assessment / Plan:      Coronary Artery Disease- patent stent to OM, 3V otherwise-- requesting coronary bypass grafting consult.. currently chest pain free, some shortness of breath on NTG drip at 65 mcg/min Diabetes Mellitus uncontrolled- patient was non-compliant initially, now has  been taking insulin for past month.. I have educated patient and daughter in law on importance of good glucose control for longevity of vein grafts.. will consult diabetes coordinator per their request for Dexcom glucose monitoring Chronic diastolic hear failure Gangrenous left toe.. vascular surgery on board, currently scheduled for angiogram tomorrow with Dr. HaStanford BreedTN Obesity CKD Stg 3- creatinine currently 1.46 Dispo- patient stable, currently chest pain free, several underlying issues with elevated creatinine, gangrenous toe.. Will place order for preoperative CABG studies.. question will be timing of surgery.. Will discuss with Dr. BaCyndia Benthowever first OR availability would be next week.  I  spent 55 minutes counseling the patient face to face.   ErEllwood HandlerPA-C 10/04/2022 10:39 AM   Chart reviewed, patient examined, agree with above.  This [age]ear old woman presents with recurrent angina at low level of exertion as well as tiredness and exertional fatigue. PET scan shows multi-vessel ischemia. Cath films reviewed and show chronically occluded proximal RCA with LCX to distal RCA collaterals. There is 80% proximal LAD and 90% ostial diagonal stenoses  and they are both graftable vessels. The LCX is patent at old stent site. LVEF normal with no significant valvular disease. I agree that CABG is the best long term treatment for this diabetic woman with multivessel disease. She also has poorly controlled DM, stage 3 CKD, and PVD with a gangrenous left 5th toe. Vascular surgery is planning an angio tomorrow to assess LLE vasculature and she will likely need amputation of toe at some point and possibly higher to heal if vascular supply to foot is insufficient. I would not be able to do surgery until Tuesday.

## 2022-10-05 ENCOUNTER — Inpatient Hospital Stay (HOSPITAL_COMMUNITY)
Admission: EM | Disposition: A | Discharge status: Home / Self Care | Payer: Self-pay | Source: Home / Self Care | Attending: Family Medicine

## 2022-10-05 ENCOUNTER — Encounter (HOSPITAL_COMMUNITY): Payer: Self-pay | Admitting: Vascular Surgery

## 2022-10-05 DIAGNOSIS — I96 Gangrene, not elsewhere classified: Secondary | ICD-10-CM | POA: Diagnosis not present

## 2022-10-05 DIAGNOSIS — I70262 Atherosclerosis of native arteries of extremities with gangrene, left leg: Secondary | ICD-10-CM | POA: Diagnosis not present

## 2022-10-05 DIAGNOSIS — R079 Chest pain, unspecified: Secondary | ICD-10-CM | POA: Diagnosis not present

## 2022-10-05 DIAGNOSIS — I2585 Chronic coronary microvascular dysfunction: Secondary | ICD-10-CM | POA: Diagnosis not present

## 2022-10-05 HISTORY — PX: ABDOMINAL AORTOGRAM W/LOWER EXTREMITY: CATH118223

## 2022-10-05 LAB — CBC
HCT: 30 % — ABNORMAL LOW (ref 36.0–46.0)
Hemoglobin: 9.8 g/dL — ABNORMAL LOW (ref 12.0–15.0)
MCH: 30.9 pg (ref 26.0–34.0)
MCHC: 32.7 g/dL (ref 30.0–36.0)
MCV: 94.6 fL (ref 80.0–100.0)
Platelets: 283 10*3/uL (ref 150–400)
RBC: 3.17 MIL/uL — ABNORMAL LOW (ref 3.87–5.11)
RDW: 12.7 % (ref 11.5–15.5)
WBC: 8.5 10*3/uL (ref 4.0–10.5)
nRBC: 0 % (ref 0.0–0.2)

## 2022-10-05 LAB — GLUCOSE, CAPILLARY
Glucose-Capillary: 208 mg/dL — ABNORMAL HIGH (ref 70–99)
Glucose-Capillary: 166 mg/dL — ABNORMAL HIGH (ref 70–99)
Glucose-Capillary: 246 mg/dL — ABNORMAL HIGH (ref 70–99)
Glucose-Capillary: 242 mg/dL — ABNORMAL HIGH (ref 70–99)

## 2022-10-05 LAB — BASIC METABOLIC PANEL
Anion gap: 6 (ref 5–15)
BUN: 20 mg/dL (ref 8–23)
CO2: 24 mmol/L (ref 22–32)
Calcium: 8.8 mg/dL — ABNORMAL LOW (ref 8.9–10.3)
Chloride: 107 mmol/L (ref 98–111)
Creatinine, Ser: 1.36 mg/dL — ABNORMAL HIGH (ref 0.44–1.00)
GFR, Estimated: 43 mL/min — ABNORMAL LOW (ref >60)
Glucose, Bld: 231 mg/dL — ABNORMAL HIGH (ref 70–99)
Potassium: 4.3 mmol/L (ref 3.5–5.1)
Sodium: 137 mmol/L (ref 135–145)

## 2022-10-05 LAB — SURGICAL PCR SCREEN
MRSA, PCR: NEGATIVE
Staphylococcus aureus: NEGATIVE

## 2022-10-05 LAB — HEPARIN LEVEL (UNFRACTIONATED): Heparin Unfractionated: 0.26 IU/mL — ABNORMAL LOW (ref 0.30–0.70)

## 2022-10-05 LAB — LIPOPROTEIN A (LPA): Lipoprotein (a): 128.9 nmol/L — ABNORMAL HIGH (ref <75.0)

## 2022-10-05 SURGERY — ABDOMINAL AORTOGRAM W/LOWER EXTREMITY
Anesthesia: LOCAL

## 2022-10-05 MED ORDER — NITROGLYCERIN 0.4 MG SL SUBL: 0.4000 mg | SUBLINGUAL_TABLET | SUBLINGUAL | Status: DC | PRN

## 2022-10-05 MED ORDER — INSULIN GLARGINE-YFGN 100 UNIT/ML ~~LOC~~ SOLN
16.0000 [IU] | Freq: Every day | SUBCUTANEOUS | Status: DC
  Administered 2022-10-05 – 2022-10-09 (×5): 16 [IU] via SUBCUTANEOUS
  Filled 2022-10-05 (×6): qty 0.16

## 2022-10-05 MED ORDER — ONDANSETRON HCL 4 MG/2ML IJ SOLN: 4.0000 mg | Freq: Four times a day (QID) | INTRAMUSCULAR | Status: DC | PRN

## 2022-10-05 MED ORDER — HEPARIN (PORCINE) IN NACL 1000-0.9 UT/500ML-% IV SOLN
INTRAVENOUS | Status: AC
  Filled 2022-10-05: qty 500

## 2022-10-05 MED ORDER — ORAL CARE MOUTH RINSE: 15.0000 mL | OROMUCOSAL | Status: DC | PRN

## 2022-10-05 MED ORDER — SODIUM CHLORIDE 0.9% FLUSH: 3.0000 mL | Freq: Two times a day (BID) | INTRAVENOUS | Status: DC

## 2022-10-05 MED ORDER — HEPARIN (PORCINE) 25000 UT/250ML-% IV SOLN
1300.0000 [IU]/h | INTRAVENOUS | Status: DC
  Administered 2022-10-05: 1200 [IU]/h via INTRAVENOUS
  Administered 2022-10-06 – 2022-10-11 (×6): 1300 [IU]/h via INTRAVENOUS
  Filled 2022-10-05 (×7): qty 250

## 2022-10-05 MED ORDER — ACETAMINOPHEN 325 MG PO TABS
650.0000 mg | ORAL_TABLET | ORAL | Status: DC | PRN
  Administered 2022-10-07 – 2022-10-11 (×10): 650 mg via ORAL
  Filled 2022-10-05 (×5): qty 2

## 2022-10-05 MED ORDER — LABETALOL HCL 5 MG/ML IV SOLN
10.0000 mg | INTRAVENOUS | Status: DC | PRN
  Administered 2022-10-09: 10 mg via INTRAVENOUS
  Filled 2022-10-05: qty 4

## 2022-10-05 MED ORDER — LIDOCAINE HCL (PF) 1 % IJ SOLN
INTRAMUSCULAR | Status: DC | PRN
  Administered 2022-10-05: 5 mL via SUBCUTANEOUS

## 2022-10-05 MED ORDER — SODIUM CHLORIDE 0.9% FLUSH: 3.0000 mL | INTRAVENOUS | Status: DC | PRN

## 2022-10-05 MED ORDER — SODIUM CHLORIDE 0.9 % WEIGHT BASED INFUSION
1.0000 mL/kg/h | INTRAVENOUS | Status: AC
  Administered 2022-10-05: 1 mL/kg/h via INTRAVENOUS

## 2022-10-05 MED ORDER — MIDAZOLAM HCL 2 MG/2ML IJ SOLN
INTRAMUSCULAR | Status: AC
  Filled 2022-10-05: qty 2

## 2022-10-05 MED ORDER — FENTANYL CITRATE (PF) 100 MCG/2ML IJ SOLN
INTRAMUSCULAR | Status: AC
  Filled 2022-10-05: qty 2

## 2022-10-05 MED ORDER — FENTANYL CITRATE (PF) 100 MCG/2ML IJ SOLN
INTRAMUSCULAR | Status: DC | PRN
  Administered 2022-10-05: 50 ug via INTRAVENOUS

## 2022-10-05 MED ORDER — MIDAZOLAM HCL 2 MG/2ML IJ SOLN
INTRAMUSCULAR | Status: DC | PRN
  Administered 2022-10-05: 1 mg via INTRAVENOUS

## 2022-10-05 MED ORDER — LIDOCAINE HCL (PF) 1 % IJ SOLN
INTRAMUSCULAR | Status: AC
  Filled 2022-10-05: qty 30

## 2022-10-05 MED ORDER — HYDRALAZINE HCL 20 MG/ML IJ SOLN: 5.0000 mg | INTRAMUSCULAR | Status: DC | PRN

## 2022-10-05 MED ORDER — IODIXANOL 320 MG/ML IV SOLN
INTRAVENOUS | Status: DC | PRN
  Administered 2022-10-05: 65 mL via INTRA_ARTERIAL

## 2022-10-05 MED ORDER — SODIUM CHLORIDE 0.9 % IV SOLN: 250.0000 mL | INTRAVENOUS | Status: DC | PRN

## 2022-10-05 SURGICAL SUPPLY — 9 items
CATH OMNI FLUSH 5F 65CM (CATHETERS) IMPLANT
DEVICE CLOSURE MYNXGRIP 5F (Vascular Products) IMPLANT
KIT MICROPUNCTURE NIT STIFF (SHEATH) IMPLANT
KIT PV (KITS) ×1 IMPLANT
SHEATH PINNACLE 5F 10CM (SHEATH) IMPLANT
SYR MEDRAD MARK 7 150ML (SYRINGE) ×1 IMPLANT
TRANSDUCER W/STOPCOCK (MISCELLANEOUS) ×1 IMPLANT
TRAY PV CATH (CUSTOM PROCEDURE TRAY) ×1 IMPLANT
WIRE BENTSON .035X145CM (WIRE) IMPLANT

## 2022-10-05 NOTE — H&P (View-Only) (Signed)
PODIATRY CONSULTATION  NAME Amanda Mendez MRN [id number] DOB [birth year] DOA 10/02/2022   Reason for consult: Gangrene left fifth toe Chief Complaint  Patient presents with   Chest Pain    Consulting physician: Nita Sells, MD, [facility]  History of present illness: 66 y.o. female admitted for worsening chest discomfort with cardiology consult.  Upon evaluation patient was found to have a necrotic left fifth toe.  Patient states that she stubbed her toe a while back.  Unsure how long it has been present for.  Vascular was consulted and patient underwent LLE angiogram today.  Podiatry then consulted to evaluate the necrotic fifth toe and discuss amputation.  Past Medical History:  Diagnosis Date   Anginal pain ([place])    Coronary artery disease    angioplasty 9/13   Exertional dyspnea    "usually; today it was with nothing" (11/03/2012)   GERD (gastroesophageal reflux disease)    Hypercholesterolemia with hypertriglyceridemia    Hypertension    Kidney stones    "I've had them 5 times; lithotripsy 1st time; flushed out  all the others" (11/03/2012)   Obesity (BMI 30-39.9)    Pneumonia 2012   Sinus headache    "weekly" (11/03/2012)   Type II diabetes mellitus ([place]) 2007       Latest Ref Rng & Units 10/05/2022    2:22 AM 10/04/2022    1:32 AM 10/03/2022    5:16 AM  CBC  WBC 4.0 - 10.5 K/uL 8.5  8.8  10.8   Hemoglobin 12.0 - 15.0 g/dL 9.8  10.3  11.9   Hematocrit 36.0 - 46.0 % 30.0  31.2  35.2   Platelets 150 - 400 K/uL 283  286  333        Latest Ref Rng & Units 10/05/2022    2:22 AM 10/04/2022    1:32 AM 10/03/2022    5:16 AM  BMP  Glucose 70 - 99 mg/dL 231  208  156   BUN 8 - 23 mg/dL 20  29  39   Creatinine 0.44 - 1.00 mg/dL 1.36  1.46  1.50   Sodium 135 - 145 mmol/L 137  137  138   Potassium 3.5 - 5.1 mmol/L 4.3  3.9  3.2   Chloride 98 - 111 mmol/L 107  104  100   CO2 22 - 32 mmol/L '24  23  27   '$ Calcium 8.9 - 10.3 mg/dL 8.8  8.8  9.1        Physical Exam: General: The patient is alert and oriented x3 in no acute distress.   Dermatology: Dry ischemic gangrene noted encompassing the majority of the left fifth digit.  This does not extend proximal and is isolated to the toe.  No malodor or drainage.  It appears very stable  Vascular: Lower extremity angiography today, 10/05/2022 OPERATIVE FINDINGS:  Terminal aorta and iliac arteries: Ulcerated plaque in infrarenal aorta No flow limiting stenosis  Neurological: Light touch and protective threshold diminished  Musculoskeletal Exam: No structural deformity noted.  No prior amputations    ASSESSMENT/PLAN OF CARE Ischemic gangrene left fifth toe -Discussed with the patient the irreversible tissue necrosis of the toe.  Recommend fifth toe amputation.  After discussing with the patient she agrees.  Risk benefits and alternatives explained. -Plan for fifth toe amputation of the left foot tomorrow, 10/06/2022, afternoon -Preoperative orders placed.  N.p.o. after midnight -Will follow    Thank you for the consult.  Please contact me directly with any  questions or concerns.  Cell [phone]   Edrick Kins, DPM [facility]  Dr. Edrick Kins, DPM    [address], [place] [address]                Office [phone]  Fax [phone]

## 2022-10-05 NOTE — Progress Notes (Signed)
ANTICOAGULATION CONSULT NOTE - Follow Up Consult  Pharmacy Consult for heparin Indication:  CAD awaiting CABG  Labs: Recent Labs    10/02/22 1529 10/02/22 1710 10/03/22 0158 10/03/22 0516 10/03/22 1046 10/04/22 0132 10/04/22 1055 10/05/22 0222  HGB 13.2  --   --  11.9*  --  10.3*  --  9.8*  HCT 39.1  --   --  35.2*  --  31.2*  --  30.0*  PLT 323  --   --  333  --  286  --  283  APTT  --   --   --   --  23*  --   --   --   LABPROT  --   --   --   --  12.7  --   --   --   INR  --   --   --   --  1.0  --   --   --   HEPARINUNFRC  --   --   --   --   --   --  0.15* 0.26*  CREATININE 0.97  --   --  1.50*  --  1.46*  --  1.36*  TROPONINIHS 22* 22* 21*  --   --   --   --   --      Assessment: 66yo female now s/p vascular procedure to resume heparin at 1800 pm  Awaiting CABG  Goal of Therapy:  Heparin level 0.3-0.7 units/ml   Plan:  Heparin to resume at 18:00 pm at 1200 units / hr Daily heparin level, CBC  Thank you Anette Guarneri, PharmD 10/05/2022,2:10 PM

## 2022-10-05 NOTE — Progress Notes (Signed)
Rounding Note    Patient Name: Sidnee Gambrill Date of Encounter: 10/05/2022  [place] Cardiologist: Minus Breeding, MD   Subjective   Mrs. Whisenant reports no CP. She understands the plan with vascular and anticipates CT SUR eval.  Inpatient Medications    Scheduled Meds:  aspirin  81 mg Oral Daily   DULoxetine  30 mg Oral Daily   insulin aspart  0-15 Units Subcutaneous TID AC & HS   insulin glargine-yfgn  12 Units Subcutaneous Daily   metoprolol tartrate  50 mg Oral BID   pantoprazole  40 mg Oral Daily   rosuvastatin  40 mg Oral Daily   sodium chloride flush  3 mL Intravenous Q12H   sodium chloride flush  3 mL Intravenous Q12H   Continuous Infusions:  sodium chloride 75 mL/hr at 10/05/22 0908   sodium chloride     sodium chloride     heparin 1,100 Units/hr (10/05/22 0446)   nitroGLYCERIN 95 mcg/min (10/05/22 0650)   PRN Meds: sodium chloride, sodium chloride, acetaminophen, hydrALAZINE, ondansetron **OR** ondansetron (ZOFRAN) IV, mouth rinse, polyethylene glycol, sodium chloride flush, sodium chloride flush   Vital Signs    Vitals:   10/05/22 0500 10/05/22 0501 10/05/22 0649 10/05/22 0650  BP: (!) 164/78 (!) 155/76 (!) 140/67 (!) 140/67  Pulse: 83 85 85 81  Resp: (!) 22 (!) '22 15 20  '$ Temp:      TempSrc:      SpO2: 100% 97% 98% 98%  Weight:      Height:        Intake/Output Summary (Last 24 hours) at 10/05/2022 1019 Last data filed at 10/05/2022 0443 Gross per 24 hour  Intake 3138.99 ml  Output 650 ml  Net 2488.99 ml      10/05/2022    3:46 AM 10/04/2022    6:10 AM 10/03/2022   10:38 AM  Last 3 Weights  Weight (lbs) 186 lb 14.4 oz 180 lb 177 lb  Weight (kg) 84.777 kg 81.647 kg 80.287 kg      Telemetry    NSR - Personally Reviewed  ECG    NA - Personally Reviewed  Physical Exam   Vitals:   10/05/22 0649 10/05/22 0650  BP: (!) 140/67 (!) 140/67  Pulse: 85 81  Resp: 15 20  Temp:    SpO2: 98% 98%    GEN: No acute  distress.   Neck: No sig JVD Cardiac: RRR, no murmurs, rubs, or gallops.  Respiratory: Clear to auscultation bilaterally. GI: Soft, nontender, non-distended  MS: No edema; No deformity. L toe -necrotic Vasc: palpable L DP pulse Neuro:  Nonfocal  Psych: Normal affect   Labs    High Sensitivity Troponin:   Recent Labs  Lab 10/02/22 1529 10/02/22 1710 10/03/22 0158  TROPONINIHS 22* 22* 21*     Chemistry Recent Labs  Lab 10/02/22 1529 10/03/22 0516 10/04/22 0132 10/05/22 0222  NA 135 138 137 137  K 4.0 3.2* 3.9 4.3  CL 95* 100 104 107  CO2 '28 27 23 24  '$ GLUCOSE 271* 156* 208* 231*  BUN 32* 39* 29* 20  CREATININE 0.97 1.50* 1.46* 1.36*  CALCIUM 9.0 9.1 8.8* 8.8*  MG  --  1.8  --   --   PROT 7.6 6.6  --   --   ALBUMIN 3.8 3.3*  --   --   AST 43* 29  --   --   ALT 24 26  --   --   ALKPHOS 79  73  --   --   BILITOT 1.1 0.6  --   --   GFRNONAA >60 38* 39* 43*  ANIONGAP '12 11 10 6    '$ Lipids No results for input(s): "CHOL", "TRIG", "HDL", "LABVLDL", "LDLCALC", "CHOLHDL" in the last 168 hours.  Hematology Recent Labs  Lab 10/03/22 0516 10/04/22 0132 10/05/22 0222  WBC 10.8* 8.8 8.5  RBC 3.77* 3.30* 3.17*  HGB 11.9* 10.3* 9.8*  HCT 35.2* 31.2* 30.0*  MCV 93.4 94.5 94.6  MCH 31.6 31.2 30.9  MCHC 33.8 33.0 32.7  RDW 13.0 12.8 12.7  PLT 333 286 283   Thyroid No results for input(s): "TSH", "FREET4" in the last 168 hours.  BNPNo results for input(s): "BNP", "PROBNP" in the last 168 hours.  DDimer  Recent Labs  Lab 10/02/22 1949  DDIMER 1.03*     Radiology    VAS US DOPPLER PRE CABG  Result Date: 10/04/2022 PREOPERATIVE VASCULAR EVALUATION Patient Name:  YULISSA NEEDHAM  Date of Exam:   10/04/2022 Medical Rec #: [id number]       Accession #:    [id number] Date of Birth: 1956-11-24       Patient Gender: F Patient Age:   66 years Exam Location:  [facility] Procedure:      VAS US DOPPLER PRE CABG Referring Phys: Junie Panning BARRETT  --------------------------------------------------------------------------------  Indications:      Pre-CABG. Gangrene. Risk Factors:     Hypertension, hyperlipidemia, Diabetes, past history of                   smoking, coronary artery disease, PAD. Comparison Study: No prior study Performing Technologist: Sharion Dove RVS  Examination Guidelines: A complete evaluation includes B-mode imaging, spectral Doppler, color Doppler, and power Doppler as needed of all accessible portions of each vessel. Bilateral testing is considered an integral part of a complete examination. Limited examinations for reoccurring indications may be performed as noted.  Right Carotid Findings: +----------+--------+--------+--------+------------+------------------+           PSV cm/sEDV cm/sStenosisDescribe    Comments           +----------+--------+--------+--------+------------+------------------+ CCA Prox  81      16                          intimal thickening +----------+--------+--------+--------+------------+------------------+ CCA Distal93      14                          intimal thickening +----------+--------+--------+--------+------------+------------------+ ICA Prox  80      24              heterogenous                   +----------+--------+--------+--------+------------+------------------+ ICA Mid   104     26                                             +----------+--------+--------+--------+------------+------------------+ ICA Distal145     41                                             +----------+--------+--------+--------+------------+------------------+ ECA       71  2                                              +----------+--------+--------+--------+------------+------------------+ +----------+--------+-------+--------+------------+           PSV cm/sEDV cmsDescribeArm Pressure +----------+--------+-------+--------+------------+ Subclavian129     127                          +----------+--------+-------+--------+------------+ +---------+--------+--+--------+--+ VertebralPSV cm/s70EDV cm/s15 +---------+--------+--+--------+--+ Left Carotid Findings: +----------+--------+--------+--------+------------+------------------+           PSV cm/sEDV cm/sStenosisDescribe    Comments           +----------+--------+--------+--------+------------+------------------+ CCA Prox  67      11                          intimal thickening +----------+--------+--------+--------+------------+------------------+ CCA Distal91      19                          intimal thickening +----------+--------+--------+--------+------------+------------------+ ICA Prox  83      27              heterogenous                   +----------+--------+--------+--------+------------+------------------+ ICA Mid   75      20                                             +----------+--------+--------+--------+------------+------------------+ ICA Distal91      27                                             +----------+--------+--------+--------+------------+------------------+ ECA       160     6                                              +----------+--------+--------+--------+------------+------------------+  +----------+--------+--------+--------+------------+ SubclavianPSV cm/sEDV cm/sDescribeArm Pressure +----------+--------+--------+--------+------------+           163                                  +----------+--------+--------+--------+------------+ +---------+--------+--+--------+--+ VertebralPSV cm/s74EDV cm/s22 +---------+--------+--+--------+--+  ABI Findings: +---------+------------------+-----+-----------+--------+ Right    Rt Pressure (mmHg)IndexWaveform   Comment  +---------+------------------+-----+-----------+--------+ Brachial 140                    triphasic            +---------+------------------+-----+-----------+--------+ PTA      135               0.96 multiphasic         +---------+------------------+-----+-----------+--------+ DP       165               1.18 multiphasic         +---------+------------------+-----+-----------+--------+ Audrie Lia  0.85 Normal              +---------+------------------+-----+-----------+--------+ +---------+------------------+-----+-----------+----------+ Left     Lt Pressure (mmHg)IndexWaveform   Comment    +---------+------------------+-----+-----------+----------+ Brachial                                   restricted +---------+------------------+-----+-----------+----------+ PTA      161               1.15 multiphasic           +---------+------------------+-----+-----------+----------+ DP       153               1.09 multiphasic           +---------+------------------+-----+-----------+----------+ Great Toe130               0.93 Normal                +---------+------------------+-----+-----------+----------+ +-------+---------------+----------------+ ABI/TBIToday's ABI/TBIPrevious ABI/TBI +-------+---------------+----------------+ Right  1.18/0.85                       +-------+---------------+----------------+ Left   1.15/0.93                       +-------+---------------+----------------+  Right Doppler Findings: +--------+--------+-----+---------+--------+ Site    PressureIndexDoppler  Comments +--------+--------+-----+---------+--------+ [id number]          triphasic         +--------+--------+-----+---------+--------+ Forearm              triphasic         +--------+--------+-----+---------+--------+ Radial               triphasic         +--------+--------+-----+---------+--------+  Left Doppler Findings: +--------+--------+-----+---------+----------+ Site    PressureIndexDoppler  Comments    +--------+--------+-----+---------+----------+ Brachial                      restricted +--------+--------+-----+---------+----------+ Radial               triphasic           +--------+--------+-----+---------+----------+ Ulnar                triphasic           +--------+--------+-----+---------+----------+   Summary: Right Carotid: The extracranial vessels were near-normal with only minimal wall                thickening or plaque. Left Carotid: The extracranial vessels were near-normal with only minimal wall               thickening or plaque. Vertebrals:  Bilateral vertebral arteries demonstrate antegrade flow. Subclavians: Normal flow hemodynamics were seen in bilateral subclavian              arteries. Right ABI: Resting right ankle-brachial index is within normal range. The right toe-brachial index is normal. Left ABI: Resting left ankle-brachial index is within normal range. The left toe-brachial index is normal. Right Upper Extremity: Doppler waveforms remain within normal limits with right radial compression. Doppler waveform obliterate with right ulnar compression. Left Upper Extremity: Doppler waveform obliterate with left radial compression. Doppler waveforms remain within normal limits with left ulnar compression.     Preliminary    VAS [place] LOWER EXTREMITY SAPHENOUS VEIN MAPPING  Result Date: 10/04/2022 LOWER EXTREMITY VEIN MAPPING Patient Name:  DEJANAE Roen  Date  of Exam:   10/04/2022 Medical Rec #: [id number]       Accession #:    [id number] Date of Birth: [birth year]       Patient Gender: F Patient Age:   37 years Exam Location:  [facility] Procedure:      VAS [place] LOWER EXTREMITY SAPHENOUS VEIN MAPPING Referring Phys: Ellwood Handler --------------------------------------------------------------------------------  Indications:  Pre-op Risk Factors: Hypertension, hyperlipidemia, Diabetes, past history of smoking,               coronary artery disease, PAD.  Comparison  Study: No prior study Performing Technologist: Sharion Dove RVS  Examination Guidelines: A complete evaluation includes B-mode imaging, spectral Doppler, color Doppler, and power Doppler as needed of all accessible portions of each vessel. Bilateral testing is considered an integral part of a complete examination. Limited examinations for reoccurring indications may be performed as noted. +---------------+-----------+----------------------+---------------+-----------+   RT Diameter  RT Findings         GSV            LT Diameter  LT Findings      (cm)                                            (cm)                  +---------------+-----------+----------------------+---------------+-----------+      0.69                     Saphenofemoral       0.73/0.30    branching                                   Junction                                  +---------------+-----------+----------------------+---------------+-----------+    0.55/0.28    branching     Proximal thigh         0.65                  +---------------+-----------+----------------------+---------------+-----------+      0.47       branching       Mid thigh            0.47                  +---------------+-----------+----------------------+---------------+-----------+      0.43                      Distal thigh        0.37/0.26               +---------------+-----------+----------------------+---------------+-----------+      0.46                          Knee              0.47                  +---------------+-----------+----------------------+---------------+-----------+      0.36                       Prox calf  0.42                  +---------------+-----------+----------------------+---------------+-----------+      0.36                        Mid calf          0.42/0.19    branching  +---------------+-----------+----------------------+---------------+-----------+    0.35/0.23     branching      Distal calf           0.33                  +---------------+-----------+----------------------+---------------+-----------+      0.36                         Ankle              0.37                  +---------------+-----------+----------------------+---------------+-----------+ Diagnosing physician: Orlie Pollen Electronically signed by Orlie Pollen on 10/04/2022 at 2:41:22 PM.    Final    CARDIAC CATHETERIZATION  Result Date: 10/03/2022   1st Mrg lesion is 10% stenosed. 1.  Severe multivessel disease consisting of chronic total occlusion of right coronary artery (which has a high anterior takeoff), proximal LAD, and first diagonal disease. 2.  Patent first obtuse marginal stent. 3.  LVEDP of 26 mmHg. Recommendation: Cardiothoracic surgery consultation; results discussed with Dr. Harl Bowie.   ECHOCARDIOGRAM COMPLETE  Result Date: 10/03/2022    ECHOCARDIOGRAM REPORT   Patient Name:   HARLOW Kyer Date of Exam: 10/03/2022 Medical Rec #:  [id number]      Height:       62.0 in Accession #:    [id number]     Weight:       177.0 lb Date of Birth:  August 29, 1956      BSA:          1.815 m Patient Age:    64 years       BP:           136/68 mmHg Patient Gender: F              HR:           105 bpm. Exam Location:  Inpatient Procedure: 2D Echo, Cardiac Doppler, Color Doppler and Intracardiac            Opacification Agent Indications:    Chest Pain  History:        Patient has prior history of Echocardiogram examinations, most                 recent 09/24/2014. CHF, CAD, Prior Cardiac Surgery,                 Signs/Symptoms:Chest Pain, Dyspnea and Murmur; Risk                 Factors:Hypertension, Diabetes, Dyslipidemia and Former Smoker.  Sonographer:    Wenda Low Referring Phys: [id number] [facility]  Sonographer Comments: Image acquisition challenging due to respiratory motion. IMPRESSIONS  1. Left ventricular ejection fraction, by estimation, is 70 to 75%. The left  ventricle has hyperdynamic function. The left ventricle has no regional wall motion abnormalities. There is mild concentric left ventricular hypertrophy. Left ventricular diastolic parameters are consistent with Grade I diastolic dysfunction (impaired relaxation).  2. Right ventricular systolic function is normal. The right ventricular size is normal.  3.  The mitral valve is normal in structure. No evidence of mitral valve regurgitation. No evidence of mitral stenosis.  4. The aortic valve is normal in structure. Aortic valve regurgitation is not visualized. No aortic stenosis is present.  5. The inferior vena cava is normal in size with greater than 50% respiratory variability, suggesting right atrial pressure of 3 mmHg. FINDINGS  Left Ventricle: Left ventricular ejection fraction, by estimation, is 70 to 75%. The left ventricle has hyperdynamic function. The left ventricle has no regional wall motion abnormalities. Definity contrast agent was given IV to delineate the left ventricular endocardial borders. The left ventricular internal cavity size was normal in size. There is mild concentric left ventricular hypertrophy. Left ventricular diastolic parameters are consistent with Grade I diastolic dysfunction (impaired relaxation). Right Ventricle: The right ventricular size is normal. No increase in right ventricular wall thickness. Right ventricular systolic function is normal. Left Atrium: Left atrial size was normal in size. Right Atrium: Right atrial size was normal in size. Pericardium: There is no evidence of pericardial effusion. Mitral Valve: The mitral valve is normal in structure. No evidence of mitral valve regurgitation. No evidence of mitral valve stenosis. MV peak gradient, 5.8 mmHg. The mean mitral valve gradient is 2.0 mmHg. Tricuspid Valve: The tricuspid valve is normal in structure. Tricuspid valve regurgitation is not demonstrated. No evidence of tricuspid stenosis. Aortic Valve: The aortic valve  is normal in structure. Aortic valve regurgitation is not visualized. No aortic stenosis is present. Aortic valve mean gradient measures 7.0 mmHg. Aortic valve peak gradient measures 13.8 mmHg. Aortic valve area, by VTI measures 2.64 cm. Pulmonic Valve: The pulmonic valve was normal in structure. Pulmonic valve regurgitation is not visualized. No evidence of pulmonic stenosis. Aorta: The aortic root is normal in size and structure. Venous: The inferior vena cava is normal in size with greater than 50% respiratory variability, suggesting right atrial pressure of 3 mmHg. IAS/Shunts: No atrial level shunt detected by color flow Doppler.  LEFT VENTRICLE PLAX 2D LVIDd:         4.00 cm   Diastology LVIDs:         2.40 cm   LV e' medial:    6.20 cm/s LV PW:         1.20 cm   LV E/e' medial:  10.7 LV IVS:        1.20 cm   LV e' lateral:   5.11 cm/s LVOT diam:     1.80 cm   LV E/e' lateral: 13.0 LV SV:         74 LV SV Index:   41 LVOT Area:     2.54 cm  RIGHT VENTRICLE RV Basal diam:  3.10 cm RV Mid diam:    2.30 cm RV S prime:     14.60 cm/s TAPSE (M-mode): 2.2 cm LEFT ATRIUM             Index        RIGHT ATRIUM           Index LA diam:        3.50 cm 1.93 cm/m   RA Area:     11.50 cm LA Vol (A2C):   43.9 ml 24.19 ml/m  RA Volume:   20.80 ml  11.46 ml/m LA Vol (A4C):   46.9 ml 25.84 ml/m LA Biplane Vol: 48.0 ml 26.45 ml/m  AORTIC VALVE AV Area (Vmax):    2.19 cm AV Area (Vmean):   2.13 cm AV Area (  VTI):     2.64 cm AV Vmax:           186.00 cm/s AV Vmean:          119.000 cm/s AV VTI:            0.281 m AV Peak Grad:      13.8 mmHg AV Mean Grad:      7.0 mmHg LVOT Vmax:         160.00 cm/s LVOT Vmean:        99.800 cm/s LVOT VTI:          0.292 m LVOT/AV VTI ratio: 1.04  AORTA Ao Root diam: 3.40 cm Ao Asc diam:  3.40 cm MITRAL VALVE MV Area (PHT): 5.66 cm     SHUNTS MV Area VTI:   4.17 cm     Systemic VTI:  0.29 m MV Peak grad:  5.8 mmHg     Systemic Diam: 1.80 cm MV Mean grad:  2.0 mmHg MV Vmax:       1.20  m/s MV Vmean:      56.2 cm/s MV Decel Time: 134 msec MV E velocity: 66.40 cm/s MV A velocity: 115.00 cm/s MV E/A ratio:  0.58 Mihai Croitoru MD Electronically signed by Sanda Klein MD Signature Date/Time: 10/03/2022/12:31:26 PM    Final     Cardiac Studies   TTE 10/03/2022  1. Left ventricular ejection fraction, by estimation, is 70 to 75%. The  left ventricle has hyperdynamic function. The left ventricle has no  regional wall motion abnormalities. There is mild concentric left  ventricular hypertrophy. Left ventricular  diastolic parameters are consistent with Grade I diastolic dysfunction  (impaired relaxation).   2. Right ventricular systolic function is normal. The right ventricular  size is normal.   3. The mitral valve is normal in structure. No evidence of mitral valve  regurgitation. No evidence of mitral stenosis.   4. The aortic valve is normal in structure. Aortic valve regurgitation is  not visualized. No aortic stenosis is present.   5. The inferior vena cava is normal in size with greater than 50%  respiratory variability, suggesting right atrial pressure of 3 mmHg.   LHC 10/03/2022 Left Anterior Descending  Prox LAD lesion is 80% stenosed with 90% stenosed side Sarika Baldini in 1st Diag.    Left Circumflex    First Obtuse Marginal Zylah Elsbernd  1st Mrg lesion is 10% stenosed. The lesion was previously treated .    Right Coronary Artery  Prox RCA lesion is 100% stenosed.    Third Right Posterolateral Cloria Ciresi  Collaterals  3rd RPL filled by collaterals from 3rd Mrg.    Collaterals  3rd RPL filled by collaterals from 3rd Mrg.      Patient Profile     Dawson Hollman is a 66 y.o. female with a hx of CAD s/p stenting to OM in 2013, hypertension, diabetes, hyperlipidemia and intermittent renal insufficiency who is being seen 10/03/2022 for the evaluation of chest pain at the request of Dr. Cyd Silence  Assessment & Plan    MVD:  LHC 10/25-patent Om1 stent. 80% LAD/90% D1 at  the bifurcation. RCA CTO with L-R collaterals. Recommendation for CT SUR evaluation to consider CABG.  TC 275, TG too high for LDL calc 557, Hdl 43. - CT SUR, pending eval - continue asa 81 mg daily -increased crestor to 40 mg daily; plan for recheck lipids in follow up. Likely will need zetia , may benefit from [facility] for aggressive LDL target <  55 mg/dL - continue metop tartrate 50 mg BID - continue heparin gtt - will stop nitro gtt, CP free and has been on for a few days. Will start nitro PRN  Necrotic fifth L toe: planned for angiography today 10/27 since she received contrast from her [facility] 10/25. Likely may require an amputation. Vascular sx following, appreciate their help  DM2: poorly controlled. A1c 11. Lost coverage for Januvia contributing to increase. Would appreciate help with primary team on this  For questions or updates, please contact [place] Please consult [email or web address] for contact info under    Time Spent Directly with Patient:  I have spent a total of 25 minutes with the patient reviewing hospital notes, telemetry, EKGs, labs and examining the patient as well as establishing an assessment and plan that was discussed personally with the patient.  > 50% of time was spent in direct patient care.       Signed, Janina Mayo, MD  10/05/2022, 10:19 AM

## 2022-10-05 NOTE — Op Note (Signed)
DATE OF SERVICE: 10/05/2022  PATIENT:  Amanda Mendez  66 y.o. female  PRE-OPERATIVE DIAGNOSIS:  Atherosclerosis of native arteries of left lower extremity causing gangrene  POST-OPERATIVE DIAGNOSIS:  Same  PROCEDURE:   1) Ultrasound guided right common femoral artery access 2) Aortogram 3) Left lower extremity angiogram with second order cannulation 4) Conscious sedation (23 minutes)  SURGEON:  Yevonne Aline. Stanford Breed, MD  ASSISTANT: none  ANESTHESIA:   local and IV sedation  ESTIMATED BLOOD LOSS: minimal  LOCAL MEDICATIONS USED:  LIDOCAINE   COUNTS: confirmed correct.  PATIENT DISPOSITION:  PACU - hemodynamically stable.   Delay start of Pharmacological VTE agent (>24hrs) due to surgical blood loss or risk of bleeding: no  INDICATION FOR PROCEDURE: Amanda Mendez is a 66 y.o. female with left fifth toe gangrene. After careful discussion of risks, benefits, and alternatives the patient was offered angiography.  The patient understood and wished to proceed.  OPERATIVE FINDINGS:  Terminal aorta and iliac arteries: Ulcerated plaque in infrarenal aorta No flow limiting stenosis  Left lower extremity: Common femoral artery: patent without hemodynamically significant stenosis.  Profunda femoris artery: patent without hemodynamically significant stenosis.   Superficial femoral artery: patent without hemodynamically significant stenosis.  Popliteal artery: patent without hemodynamically significant stenosis.  Anterior tibial artery: disease about the origin, but patent without hemodynamically significant stenosis.  Tibioperoneal trunk: disease about the origin, but patent without hemodynamically significant stenosis.  Peroneal artery: disease about the origin, but patent without hemodynamically significant stenosis.  Posterior tibial artery: disease about the origin, but patent without hemodynamically significant stenosis.  Pedal circulation: fills via PT / peroneal  GLASS score. IP  0 / FP -0.   WIfI score. 2 / 0 / 0. Low risk of major amputation.  DESCRIPTION OF PROCEDURE: After identification of the patient in the pre-operative holding area, the patient was transferred to the operating room. The patient was positioned supine on the operating room table. Anesthesia was induced. The groins was prepped and draped in standard fashion. A surgical pause was performed confirming correct patient, procedure, and operative location.  The rght groin was anesthetized with subcutaneous injection of 1% lidocaine. Using ultrasound guidance, the right common femoral artery was accessed with micropuncture technique. Fluoroscopy was used to confirm cannulation over the femoral head. The 42F sheath was upsized to 16F.   A Benson wire was advanced into the distal aorta. Over the wire an omni flush catheter was advanced to the level of L2. Aortogram was performed - see above for details.  The left common iliac artery was selected with an omniflush catheter and Benson guidewire. The wire was advanced into the common femoral artery. Over the wire the omni flush catheter was advanced into the external iliac artery. Selective angiography was performed - see above for details.   A mynx device was used to close the arteriotomy. Hemostasis was excellent upon completion.  Conscious sedation was administered with the use of IV fentanyl and midazolam under continuous physician and nurse monitoring.  Heart rate, blood pressure, and oxygen saturation were continuously monitored.  Total sedation time was 23 minutes  Upon completion of the case instrument and sharps counts were confirmed correct. The patient was transferred to the PACU in good condition. I was present for all portions of the procedure.  PLAN: ASA '81mg'$  PO QD. High intensity statin therapy. Recommend podiatry evaluation for consideration of toe amputation.   Yevonne Aline. Stanford Breed, MD [facility] Phone Number:  [phone] 10/05/2022  1:54 PM

## 2022-10-05 NOTE — Progress Notes (Addendum)
PROGRESS NOTE   Amanda Mendez  QZE:[id number] DOB: Apr 30, 1956 DOA: 10/02/2022 PCP: Redmond School, MD  Brief Narrative:  66 year old white female known DM TY 2, HTN CAD PCI 08/15/2012 HFpEF EF 65-70% CKD 3 AA MGUS prior severe left lower extremity pain secondary to impinged nerve of lumbar spine. Seen by cardiology September 2023 Dr. Percival Spanish endorsing chest pain + SOB While undergoing cardiac perfusion study at [facility] 10/24 started having chest pain--PET = balanced ischemia MVD +/- microvascular disease and ST-T wave changes during study Work-up = slight elevation troponin 22, EKG no ST T wave changes-started on nitro gtt. after consultation with cardiology--- also found to have necrotic toe left little 10/26 cardiac cath-chronic total occlusion RCA proximal LAD first diagonal patent first obtuse marginal-cardiothoracic surgery consulted--CT PE study negative for PE, echocardiogram-70-75% with grade 1 DD 10/26 vascular surgery consulted-planning on doing angiogram 10/27 underwent angiogram, podiatry consulted   Hospital-Problem based course  Unstable angina with multivessel disease on cath 10/26 CAD and PCI in 08/15/2012 Cardiology assisting with management and appreciated-continue heparin as per them, aspirin 81 mg, metoprolol 50 twice daily, nitroglycerin drip stopped and now on sublingual, Crestor 40 Cardiothoracic surgeons have seen patient, tentative CABG 10/31  Dry gangrene left fifth toe Angiogram 10/27 shows reasonable flow As is a drive probably does not need antibiotics and may auto amputate We will ask podiatry Dr. Amalia Hailey for an opinion appreciate input  ATN secondary to ARB/contrast load superimposed on CKD 3 AA Hydrating with fluids saline 75 cc/h Creatinine has improved Holding Hyzaar 100-25, on discharge use lower dose or separate agent  Elevated D-dimer CTA chest negative for PE  Hypokalemia --resolved  DM TY 2 >A1c is >11 CBGs 162 230 Amaryl 2 mg 3 times  daily on hold will resume after all procedures -continue sliding scale for now,  -Increase Levemir 12-->16 units, adjust as needed   DVT prophylaxis: Continues on IV heparin therapeutic dosing Code Status: Full Family Communication: None present at the bedside Disposition:  Status is: Inpatient Remains inpatient appropriate because: Will need evaluation by cardiothoracic surgery and will need angiogram and potential little toe amputation on the left side   Consultants:  Cardiology Cardiothoracic surgery Vascular surgery  Procedures: Cardiac cath 10/25 conclusion      1st Mrg lesion is 10% stenosed.   1.  Severe multivessel disease consisting of chronic total occlusion of right coronary artery (which has a high anterior takeoff), proximal LAD, and first diagonal disease. 2.  Patent first obtuse marginal stent. 3.  LVEDP of 26 mmHg.  Antimicrobials: None currently   Subjective:  Overall fair no wheeze rales rhonchi looks comfortable tells me he wants to stay still although I do not know if this is something that is possible ROM intact she is still on some bedrest however   Objective: Vitals:   10/05/22 1345 10/05/22 1415 10/05/22 1500 10/05/22 1515  BP: (!) 149/85 114/83 (!) 157/75 (!) 150/80  Pulse: (!) 0 81 84 84  Resp:   20 17  Temp:      TempSrc:      SpO2:  96% 97% 98%  Weight:      Height:        Intake/Output Summary (Last 24 hours) at 10/05/2022 1540 Last data filed at 10/05/2022 1100 Gross per 24 hour  Intake 2083.44 ml  Output 850 ml  Net 1233.44 ml   Filed Weights   10/03/22 1038 10/04/22 0610 10/05/22 0346  Weight: 80.3 kg 81.6 kg 84.8 kg  Examination:  EOMI NCAT  frail white female no distress no icterus no pallor CTA B no added sound no rales rhonchi wheezes Abdomen soft no rebound ROM intact Toe not examined today No lower extremity edema Power is 5/5  Data Reviewed: personally reviewed   CBC    Component Value Date/Time   WBC  8.5 10/05/2022 0222   RBC 3.17 (L) 10/05/2022 0222   HGB 9.8 (L) 10/05/2022 0222   HCT 30.0 (L) 10/05/2022 0222   PLT 283 10/05/2022 0222   MCV 94.6 10/05/2022 0222   MCH 30.9 10/05/2022 0222   MCHC 32.7 10/05/2022 0222   RDW 12.7 10/05/2022 0222   LYMPHSABS 2.4 10/03/2022 0516   MONOABS 1.1 (H) 10/03/2022 0516   EOSABS 0.3 10/03/2022 0516   BASOSABS 0.1 10/03/2022 0516      Latest Ref Rng & Units 10/05/2022    2:22 AM 10/04/2022    1:32 AM 10/03/2022    5:16 AM  CMP  Glucose 70 - 99 mg/dL 231  208  156   BUN 8 - 23 mg/dL 20  29  39   Creatinine 0.44 - 1.00 mg/dL 1.36  1.46  1.50   Sodium 135 - 145 mmol/L 137  137  138   Potassium 3.5 - 5.1 mmol/L 4.3  3.9  3.2   Chloride 98 - 111 mmol/L 107  104  100   CO2 22 - 32 mmol/L '24  23  27   '$ Calcium 8.9 - 10.3 mg/dL 8.8  8.8  9.1   Total Protein 6.5 - 8.1 g/dL   6.6   Total Bilirubin 0.3 - 1.2 mg/dL   0.6   Alkaline Phos 38 - 126 U/L   73   AST 15 - 41 U/L   29   ALT 0 - 44 U/L   26      Radiology Studies: PERIPHERAL VASCULAR CATHETERIZATION  Result Date: 10/05/2022 DATE OF SERVICE: 10/05/2022  PATIENT:  Amanda Mendez  66 y.o. female  PRE-OPERATIVE DIAGNOSIS:  Atherosclerosis of native arteries of left lower extremity causing gangrene  POST-OPERATIVE DIAGNOSIS:  Same  PROCEDURE:  1) Ultrasound guided right common femoral artery access 2) Aortogram 3) Left lower extremity angiogram with second order cannulation 4) Conscious sedation (23 minutes)  SURGEON:  Yevonne Aline. Stanford Breed, MD  ASSISTANT: none  ANESTHESIA:   local and IV sedation  ESTIMATED BLOOD LOSS: minimal  LOCAL MEDICATIONS USED:  LIDOCAINE  COUNTS: confirmed correct.  PATIENT DISPOSITION:  PACU - hemodynamically stable.  Delay start of Pharmacological VTE agent (>24hrs) due to surgical blood loss or risk of bleeding: no  INDICATION FOR PROCEDURE: [name] is a 66 y.o. female with left fifth toe gangrene. After careful discussion of risks, benefits, and alternatives the  patient was offered angiography.  The patient understood and wished to proceed.  OPERATIVE FINDINGS: Terminal aorta and iliac arteries: Ulcerated plaque in infrarenal aorta No flow limiting stenosis  Left lower extremity: Common femoral artery: patent without hemodynamically significant stenosis. Profunda femoris artery: patent without hemodynamically significant stenosis.  Superficial femoral artery: patent without hemodynamically significant stenosis. Popliteal artery: patent without hemodynamically significant stenosis. Anterior tibial artery: disease about the origin, but patent without hemodynamically significant stenosis. Tibioperoneal trunk: disease about the origin, but patent without hemodynamically significant stenosis. Peroneal artery: disease about the origin, but patent without hemodynamically significant stenosis. Posterior tibial artery: disease about the origin, but patent without hemodynamically significant stenosis. Pedal circulation: fills via PT / peroneal  GLASS score. IP 0 /  FP -0.  WIfI score. 2 / 0 / 0. Low risk of major amputation.  DESCRIPTION OF PROCEDURE: After identification of the patient in the pre-operative holding area, the patient was transferred to the operating room. The patient was positioned supine on the operating room table. Anesthesia was induced. The groins was prepped and draped in standard fashion. A surgical pause was performed confirming correct patient, procedure, and operative location.  The rght groin was anesthetized with subcutaneous injection of 1% lidocaine. Using ultrasound guidance, the right common femoral artery was accessed with micropuncture technique. Fluoroscopy was used to confirm cannulation over the femoral head. The 13F sheath was upsized to 51F.  A Benson wire was advanced into the distal aorta. Over the wire an omni flush catheter was advanced to the level of L2. Aortogram was performed - see above for details.  The left common iliac artery was  selected with an omniflush catheter and Benson guidewire. The wire was advanced into the common femoral artery. Over the wire the omni flush catheter was advanced into the external iliac artery. Selective angiography was performed - see above for details.  A mynx device was used to close the arteriotomy. Hemostasis was excellent upon completion.  Conscious sedation was administered with the use of IV fentanyl and midazolam under continuous physician and nurse monitoring.  Heart rate, blood pressure, and oxygen saturation were continuously monitored.  Total sedation time was 23 minutes  Upon completion of the case instrument and sharps counts were confirmed correct. The patient was transferred to the PACU in good condition. I was present for all portions of the procedure.  PLAN: ASA '81mg'$  PO QD. High intensity statin therapy. Recommend podiatry evaluation for consideration of toe amputation.  Yevonne Aline. Stanford Breed, MD [facility] Phone Number: [phone] 10/05/2022 1:54 PM    VAS US DOPPLER PRE CABG  Result Date: 10/04/2022 PREOPERATIVE VASCULAR EVALUATION Patient Name:  HENRIETTE HESSER  Date of Exam:   10/04/2022 Medical Rec #: [id number]       Accession #:    [id number] Date of Birth: 05-Oct-1956       Patient Gender: F Patient Age:   69 years Exam Location:  [facility] Procedure:      VAS US DOPPLER PRE CABG Referring Phys: Junie Panning BARRETT --------------------------------------------------------------------------------  Indications:      Pre-CABG. Gangrene. Risk Factors:     Hypertension, hyperlipidemia, Diabetes, past history of                   smoking, coronary artery disease, PAD. Comparison Study: No prior study Performing Technologist: Sharion Dove RVS  Examination Guidelines: A complete evaluation includes B-mode imaging, spectral Doppler, color Doppler, and power Doppler as needed of all accessible portions of each vessel. Bilateral testing is considered an  integral part of a complete examination. Limited examinations for reoccurring indications may be performed as noted.  Right Carotid Findings: +----------+--------+--------+--------+------------+------------------+           PSV cm/sEDV cm/sStenosisDescribe    Comments           +----------+--------+--------+--------+------------+------------------+ CCA Prox  81      16                          intimal thickening +----------+--------+--------+--------+------------+------------------+ CCA Distal93      14  intimal thickening +----------+--------+--------+--------+------------+------------------+ ICA Prox  80      24              heterogenous                   +----------+--------+--------+--------+------------+------------------+ ICA Mid   104     26                                             +----------+--------+--------+--------+------------+------------------+ ICA Distal145     41                                             +----------+--------+--------+--------+------------+------------------+ ECA       71      2                                              +----------+--------+--------+--------+------------+------------------+ +----------+--------+-------+--------+------------+           PSV cm/sEDV cmsDescribeArm Pressure +----------+--------+-------+--------+------------+ Subclavian129     127                         +----------+--------+-------+--------+------------+ +---------+--------+--+--------+--+ VertebralPSV cm/s70EDV cm/s15 +---------+--------+--+--------+--+ Left Carotid Findings: +----------+--------+--------+--------+------------+------------------+           PSV cm/sEDV cm/sStenosisDescribe    Comments           +----------+--------+--------+--------+------------+------------------+ CCA Prox  67      11                          intimal thickening  +----------+--------+--------+--------+------------+------------------+ CCA Distal91      19                          intimal thickening +----------+--------+--------+--------+------------+------------------+ ICA Prox  83      27              heterogenous                   +----------+--------+--------+--------+------------+------------------+ ICA Mid   75      20                                             +----------+--------+--------+--------+------------+------------------+ ICA Distal91      27                                             +----------+--------+--------+--------+------------+------------------+ ECA       160     6                                              +----------+--------+--------+--------+------------+------------------+  +----------+--------+--------+--------+------------+ SubclavianPSV cm/sEDV cm/sDescribeArm Pressure +----------+--------+--------+--------+------------+  163                                  +----------+--------+--------+--------+------------+ +---------+--------+--+--------+--+ VertebralPSV cm/s74EDV cm/s22 +---------+--------+--+--------+--+  ABI Findings: +---------+------------------+-----+-----------+--------+ Right    Rt Pressure (mmHg)IndexWaveform   Comment  +---------+------------------+-----+-----------+--------+ Brachial 140                    triphasic           +---------+------------------+-----+-----------+--------+ PTA      135               0.96 multiphasic         +---------+------------------+-----+-----------+--------+ DP       165               1.18 multiphasic         +---------+------------------+-----+-----------+--------+ Great Toe119               0.85 Normal              +---------+------------------+-----+-----------+--------+ +---------+------------------+-----+-----------+----------+ Left     Lt Pressure (mmHg)IndexWaveform   Comment     +---------+------------------+-----+-----------+----------+ Brachial                                   restricted +---------+------------------+-----+-----------+----------+ PTA      161               1.15 multiphasic           +---------+------------------+-----+-----------+----------+ DP       153               1.09 multiphasic           +---------+------------------+-----+-----------+----------+ Great Toe130               0.93 Normal                +---------+------------------+-----+-----------+----------+ +-------+---------------+----------------+ ABI/TBIToday's ABI/TBIPrevious ABI/TBI +-------+---------------+----------------+ Right  1.18/0.85                       +-------+---------------+----------------+ Left   1.15/0.93                       +-------+---------------+----------------+  Right Doppler Findings: +--------+--------+-----+---------+--------+ Site    PressureIndexDoppler  Comments +--------+--------+-----+---------+--------+ [id number]          triphasic         +--------+--------+-----+---------+--------+ Forearm              triphasic         +--------+--------+-----+---------+--------+ Radial               triphasic         +--------+--------+-----+---------+--------+  Left Doppler Findings: +--------+--------+-----+---------+----------+ Site    PressureIndexDoppler  Comments   +--------+--------+-----+---------+----------+ Brachial                      restricted +--------+--------+-----+---------+----------+ Radial               triphasic           +--------+--------+-----+---------+----------+ Ulnar                triphasic           +--------+--------+-----+---------+----------+   Summary: Right Carotid: The extracranial vessels were near-normal with only minimal wall  thickening or plaque. Left Carotid: The extracranial vessels were near-normal with only minimal wall               thickening  or plaque. Vertebrals:  Bilateral vertebral arteries demonstrate antegrade flow. Subclavians: Normal flow hemodynamics were seen in bilateral subclavian              arteries. Right ABI: Resting right ankle-brachial index is within normal range. The right toe-brachial index is normal. Left ABI: Resting left ankle-brachial index is within normal range. The left toe-brachial index is normal. Right Upper Extremity: Doppler waveforms remain within normal limits with right radial compression. Doppler waveform obliterate with right ulnar compression. Left Upper Extremity: Doppler waveform obliterate with left radial compression. Doppler waveforms remain within normal limits with left ulnar compression.     Preliminary    VAS [place] LOWER EXTREMITY SAPHENOUS VEIN MAPPING  Result Date: 10/04/2022 LOWER EXTREMITY VEIN MAPPING Patient Name:  LEINANI LISBON  Date of Exam:   10/04/2022 Medical Rec #: [id number]       Accession #:    [id number] Date of Birth: 1956-07-04       Patient Gender: F Patient Age:   80 years Exam Location:  [facility] Procedure:      VAS [place] LOWER EXTREMITY SAPHENOUS VEIN MAPPING Referring Phys: Ellwood Handler --------------------------------------------------------------------------------  Indications:  Pre-op Risk Factors: Hypertension, hyperlipidemia, Diabetes, past history of smoking,               coronary artery disease, PAD.  Comparison Study: No prior study Performing Technologist: Sharion Dove RVS  Examination Guidelines: A complete evaluation includes B-mode imaging, spectral Doppler, color Doppler, and power Doppler as needed of all accessible portions of each vessel. Bilateral testing is considered an integral part of a complete examination. Limited examinations for reoccurring indications may be performed as noted. +---------------+-----------+----------------------+---------------+-----------+   RT Diameter  RT Findings         GSV            LT Diameter  LT Findings       (cm)                                            (cm)                  +---------------+-----------+----------------------+---------------+-----------+      0.69                     Saphenofemoral       0.73/0.30    branching                                   Junction                                  +---------------+-----------+----------------------+---------------+-----------+    0.55/0.28    branching     Proximal thigh         0.65                  +---------------+-----------+----------------------+---------------+-----------+      0.47       branching       Mid thigh            0.47                  +---------------+-----------+----------------------+---------------+-----------+  0.43                      Distal thigh        0.37/0.26               +---------------+-----------+----------------------+---------------+-----------+      0.46                          Knee              0.47                  +---------------+-----------+----------------------+---------------+-----------+      0.36                       Prox calf            0.42                  +---------------+-----------+----------------------+---------------+-----------+      0.36                        Mid calf          0.42/0.19    branching  +---------------+-----------+----------------------+---------------+-----------+    0.35/0.23    branching      Distal calf           0.33                  +---------------+-----------+----------------------+---------------+-----------+      0.36                         Ankle              0.37                  +---------------+-----------+----------------------+---------------+-----------+ Diagnosing physician: Orlie Pollen Electronically signed by Orlie Pollen on 10/04/2022 at 2:41:22 PM.    Final    CARDIAC CATHETERIZATION  Result Date: 10/03/2022   1st Mrg lesion is 10% stenosed. 1.  Severe multivessel disease consisting of  chronic total occlusion of right coronary artery (which has a high anterior takeoff), proximal LAD, and first diagonal disease. 2.  Patent first obtuse marginal stent. 3.  LVEDP of 26 mmHg. Recommendation: Cardiothoracic surgery consultation; results discussed with Dr. Harl Bowie.     Scheduled Meds:  aspirin  81 mg Oral Daily   DULoxetine  30 mg Oral Daily   insulin aspart  0-15 Units Subcutaneous TID AC & HS   insulin glargine-yfgn  12 Units Subcutaneous Daily   metoprolol tartrate  50 mg Oral BID   pantoprazole  40 mg Oral Daily   rosuvastatin  40 mg Oral Daily   sodium chloride flush  3 mL Intravenous Q12H   sodium chloride flush  3 mL Intravenous Q12H   Continuous Infusions:  sodium chloride 75 mL/hr at 10/05/22 0908   sodium chloride     heparin       LOS: 3 days   Time spent: Irene, MD [facility] To contact the attending provider between 7A-7P or the covering provider during after hours 7P-7A, please log into the web site [email or web address] and access using universal [place] password for that web site. If you do not have the password, please call the hospital operator.  10/05/2022, 3:40 PM

## 2022-10-05 NOTE — Progress Notes (Signed)
ANTICOAGULATION CONSULT NOTE - Follow Up Consult  Pharmacy Consult for heparin Indication:  CAD awaiting CABG  Labs: Recent Labs    10/02/22 1529 10/02/22 1710 10/03/22 0158 10/03/22 0516 10/03/22 1046 10/04/22 0132 10/04/22 1055 10/05/22 0222  HGB 13.2  --   --  11.9*  --  10.3*  --  9.8*  HCT 39.1  --   --  35.2*  --  31.2*  --  30.0*  PLT 323  --   --  333  --  286  --  283  APTT  --   --   --   --  23*  --   --   --   LABPROT  --   --   --   --  12.7  --   --   --   INR  --   --   --   --  1.0  --   --   --   HEPARINUNFRC  --   --   --   --   --   --  0.15* 0.26*  CREATININE 0.97  --   --  1.50*  --  1.46*  --  1.36*  TROPONINIHS 22* 22* 21*  --   --   --   --   --     Assessment: 66yo female subtherapeutic on heparin after rate change; no infusion issues or signs of bleeding per RN.  Goal of Therapy:  Heparin level 0.3-0.7 units/ml   Plan:  Will increase heparin infusion by 2 units/kg/hr to 1100 units/hr and check level in 8 hours.    Wynona Neat, PharmD, BCPS  10/05/2022,4:40 AM

## 2022-10-05 NOTE — Care Management Important Message (Signed)
Important Message  Patient Details  Name: Amanda Mendez MRN: [id number] Date of Birth: March 18, 1956   Medicare Important Message Given:  Yes     Shelda Altes 10/05/2022, 7:56 AM

## 2022-10-05 NOTE — Inpatient Diabetes Management (Signed)
Inpatient Diabetes Program Recommendations  AACE/ADA: New Consensus Statement on Inpatient Glycemic Control (2015)  Target Ranges:  Prepandial:   less than 140 mg/dL      Peak postprandial:   less than 180 mg/dL (1-2 hours)      Critically ill patients:  140 - 180 mg/dL   Lab Results  Component Value Date   GLUCAP 208 (H) 10/05/2022   HGBA1C 11.3 (H) 10/03/2022    Review of Glycemic Control  Latest Reference Range & Units 10/04/22 06:05 10/04/22 16:32 10/04/22 21:23 10/05/22 06:17  Glucose-Capillary 70 - 99 mg/dL 168 (H) 243 (H) 200 (H) 208 (H)  (H): Data is abnormally high  Diabetes history: DM2 Outpatient Diabetes medications: Lantus 50 units QD, Amaryl 2 mg BID Current orders for Inpatient glycemic control: Semglee 12 units QD, Novolog 0-15 units TID and HS  Inpatient Diabetes Program Recommendations:    Semglee 16 units QD  Will continue to follow while inpatient.  Thank you, Reche Dixon, MSN, [place] Diabetes Coordinator Inpatient Diabetes Program [phone] (team pager from 8a-5p)

## 2022-10-05 NOTE — Consult Note (Signed)
PODIATRY CONSULTATION  NAME Amanda Mendez MRN [id number] DOB [birth year] DOA 10/02/2022   Reason for consult: Gangrene left fifth toe Chief Complaint  Patient presents with   Chest Pain    Consulting physician: Nita Sells, MD, [facility]  History of present illness: 66 y.o. female admitted for worsening chest discomfort with cardiology consult.  Upon evaluation patient was found to have a necrotic left fifth toe.  Patient states that she stubbed her toe a while back.  Unsure how long it has been present for.  Vascular was consulted and patient underwent LLE angiogram today.  Podiatry then consulted to evaluate the necrotic fifth toe and discuss amputation.  Past Medical History:  Diagnosis Date   Anginal pain ([place])    Coronary artery disease    angioplasty 9/13   Exertional dyspnea    "usually; today it was with nothing" (11/03/2012)   GERD (gastroesophageal reflux disease)    Hypercholesterolemia with hypertriglyceridemia    Hypertension    Kidney stones    "I've had them 5 times; lithotripsy 1st time; flushed out  all the others" (11/03/2012)   Obesity (BMI 30-39.9)    Pneumonia 2012   Sinus headache    "weekly" (11/03/2012)   Type II diabetes mellitus ([place]) 2007       Latest Ref Rng & Units 10/05/2022    2:22 AM 10/04/2022    1:32 AM 10/03/2022    5:16 AM  CBC  WBC 4.0 - 10.5 K/uL 8.5  8.8  10.8   Hemoglobin 12.0 - 15.0 g/dL 9.8  10.3  11.9   Hematocrit 36.0 - 46.0 % 30.0  31.2  35.2   Platelets 150 - 400 K/uL 283  286  333        Latest Ref Rng & Units 10/05/2022    2:22 AM 10/04/2022    1:32 AM 10/03/2022    5:16 AM  BMP  Glucose 70 - 99 mg/dL 231  208  156   BUN 8 - 23 mg/dL 20  29  39   Creatinine 0.44 - 1.00 mg/dL 1.36  1.46  1.50   Sodium 135 - 145 mmol/L 137  137  138   Potassium 3.5 - 5.1 mmol/L 4.3  3.9  3.2   Chloride 98 - 111 mmol/L 107  104  100   CO2 22 - 32 mmol/L '24  23  27   '$ Calcium 8.9 - 10.3 mg/dL 8.8  8.8  9.1        Physical Exam: General: The patient is alert and oriented x3 in no acute distress.   Dermatology: Dry ischemic gangrene noted encompassing the majority of the left fifth digit.  This does not extend proximal and is isolated to the toe.  No malodor or drainage.  It appears very stable  Vascular: Lower extremity angiography today, 10/05/2022 OPERATIVE FINDINGS:  Terminal aorta and iliac arteries: Ulcerated plaque in infrarenal aorta No flow limiting stenosis  Neurological: Light touch and protective threshold diminished  Musculoskeletal Exam: No structural deformity noted.  No prior amputations    ASSESSMENT/PLAN OF CARE Ischemic gangrene left fifth toe -Discussed with the patient the irreversible tissue necrosis of the toe.  Recommend fifth toe amputation.  After discussing with the patient she agrees.  Risk benefits and alternatives explained. -Plan for fifth toe amputation of the left foot tomorrow, 10/06/2022, afternoon -Preoperative orders placed.  N.p.o. after midnight -Will follow    Thank you for the consult.  Please contact me directly with any  questions or concerns.  Cell [phone]   Edrick Kins, DPM [facility]  Dr. Edrick Kins, DPM    [address], [place] [address]                Office [phone]  Fax [phone]

## 2022-10-05 NOTE — Progress Notes (Addendum)
Pt had complaints of  headache and mild chest tightness tonight, BP 133/67 -164/78 mmHg, HR 80s, NSR on the monitor. Nitroglycerine gtt was titrated per protocol. No respiratory distress, SPO2 98-100% on room air. She was well tolerated to ambulate to the restroom. She had two times of loose stools tonight. Denied abdominal pain or cramping.   Nitroglycerine gtt currently rate at 95 mcg/min. Heparin 1100 units/ hr.  NSS 75 ml/hr. NPO after midnight. Informed consent was signed, ready for angiography at am.   Kennyth Lose, RN

## 2022-10-05 NOTE — Progress Notes (Signed)
CARDIAC REHAB PHASE I    Pre-op OHS education including OHS handout, OHS booklet, early ambulation, home needs at discharge, IS use,sternal precautions, move in the tub and pain management reviewed. All questions and concerns addressed.able to reach 1250 with IS today. Will continue to follow.     [id number] Vanessa Barbara, RN BSN 10/05/2022 10:24 AM

## 2022-10-06 ENCOUNTER — Inpatient Hospital Stay (HOSPITAL_COMMUNITY): Payer: Medicare Other

## 2022-10-06 ENCOUNTER — Inpatient Hospital Stay (HOSPITAL_COMMUNITY): Payer: Medicare Other | Admitting: Certified Registered"

## 2022-10-06 ENCOUNTER — Encounter (HOSPITAL_COMMUNITY)
Admission: EM | Disposition: A | Discharge status: Home / Self Care | Payer: Self-pay | Source: Home / Self Care | Attending: Family Medicine

## 2022-10-06 ENCOUNTER — Other Ambulatory Visit: Payer: Self-pay

## 2022-10-06 ENCOUNTER — Encounter (HOSPITAL_COMMUNITY): Payer: Self-pay | Admitting: Internal Medicine

## 2022-10-06 DIAGNOSIS — N189 Chronic kidney disease, unspecified: Secondary | ICD-10-CM | POA: Diagnosis not present

## 2022-10-06 DIAGNOSIS — I129 Hypertensive chronic kidney disease with stage 1 through stage 4 chronic kidney disease, or unspecified chronic kidney disease: Secondary | ICD-10-CM

## 2022-10-06 DIAGNOSIS — E1122 Type 2 diabetes mellitus with diabetic chronic kidney disease: Secondary | ICD-10-CM | POA: Diagnosis not present

## 2022-10-06 DIAGNOSIS — Z87891 Personal history of nicotine dependence: Secondary | ICD-10-CM

## 2022-10-06 DIAGNOSIS — I25119 Atherosclerotic heart disease of native coronary artery with unspecified angina pectoris: Secondary | ICD-10-CM

## 2022-10-06 DIAGNOSIS — R079 Chest pain, unspecified: Secondary | ICD-10-CM | POA: Diagnosis not present

## 2022-10-06 DIAGNOSIS — Z7984 Long term (current) use of oral hypoglycemic drugs: Secondary | ICD-10-CM

## 2022-10-06 HISTORY — PX: AMPUTATION TOE: SHX6595

## 2022-10-06 LAB — LIPID PANEL
Cholesterol: 160 mg/dL (ref 0–200)
HDL: 46 mg/dL (ref >40)
LDL Cholesterol: 62 mg/dL (ref 0–99)
Total CHOL/HDL Ratio: 3.5 RATIO
Triglycerides: 260 mg/dL — ABNORMAL HIGH (ref <150)
VLDL: 52 mg/dL — ABNORMAL HIGH (ref 0–40)

## 2022-10-06 LAB — GLUCOSE, CAPILLARY
Glucose-Capillary: 158 mg/dL — ABNORMAL HIGH (ref 70–99)
Glucose-Capillary: 154 mg/dL — ABNORMAL HIGH (ref 70–99)
Glucose-Capillary: 118 mg/dL — ABNORMAL HIGH (ref 70–99)
Glucose-Capillary: 163 mg/dL — ABNORMAL HIGH (ref 70–99)
Glucose-Capillary: 176 mg/dL — ABNORMAL HIGH (ref 70–99)

## 2022-10-06 LAB — HEPARIN LEVEL (UNFRACTIONATED)
Heparin Unfractionated: 0.25 IU/mL — ABNORMAL LOW (ref 0.30–0.70)
Heparin Unfractionated: 0.44 IU/mL (ref 0.30–0.70)

## 2022-10-06 LAB — BASIC METABOLIC PANEL
Anion gap: 12 (ref 5–15)
BUN: 14 mg/dL (ref 8–23)
CO2: 19 mmol/L — ABNORMAL LOW (ref 22–32)
Calcium: 9.2 mg/dL (ref 8.9–10.3)
Chloride: 107 mmol/L (ref 98–111)
Creatinine, Ser: 1.15 mg/dL — ABNORMAL HIGH (ref 0.44–1.00)
GFR, Estimated: 52 mL/min — ABNORMAL LOW (ref >60)
Glucose, Bld: 193 mg/dL — ABNORMAL HIGH (ref 70–99)
Potassium: 4.1 mmol/L (ref 3.5–5.1)
Sodium: 138 mmol/L (ref 135–145)

## 2022-10-06 LAB — CBC
HCT: 34.7 % — ABNORMAL LOW (ref 36.0–46.0)
Hemoglobin: 11.6 g/dL — ABNORMAL LOW (ref 12.0–15.0)
MCH: 30.8 pg (ref 26.0–34.0)
MCHC: 33.4 g/dL (ref 30.0–36.0)
MCV: 92 fL (ref 80.0–100.0)
Platelets: 231 10*3/uL (ref 150–400)
RBC: 3.77 MIL/uL — ABNORMAL LOW (ref 3.87–5.11)
RDW: 12.9 % (ref 11.5–15.5)
WBC: 8.4 10*3/uL (ref 4.0–10.5)
nRBC: 0 % (ref 0.0–0.2)

## 2022-10-06 SURGERY — AMPUTATION, TOE
Anesthesia: Monitor Anesthesia Care | Site: Toe | Laterality: Left

## 2022-10-06 MED ORDER — INSULIN ASPART 100 UNIT/ML IJ SOLN: 0.0000 [IU] | INTRAMUSCULAR | Status: DC | PRN

## 2022-10-06 MED ORDER — PROPOFOL 500 MG/50ML IV EMUL
INTRAVENOUS | Status: DC | PRN
  Administered 2022-10-06: 75 ug/kg/min via INTRAVENOUS

## 2022-10-06 MED ORDER — LIDOCAINE HCL 2 % IJ SOLN
INTRAMUSCULAR | Status: DC | PRN
  Administered 2022-10-06: 3.5 mL

## 2022-10-06 MED ORDER — EZETIMIBE 10 MG PO TABS
10.0000 mg | ORAL_TABLET | Freq: Every day | ORAL | Status: DC
  Administered 2022-10-06 – 2022-10-18 (×12): 10 mg via ORAL
  Filled 2022-10-06 (×12): qty 1

## 2022-10-06 MED ORDER — MIDAZOLAM HCL 2 MG/2ML IJ SOLN
INTRAMUSCULAR | Status: DC | PRN
  Administered 2022-10-06: 2 mg via INTRAVENOUS

## 2022-10-06 MED ORDER — BUPIVACAINE HCL (PF) 0.5 % IJ SOLN
INTRAMUSCULAR | Status: DC | PRN
  Administered 2022-10-06: 3.5 mL

## 2022-10-06 MED ORDER — LIDOCAINE HCL 2 % IJ SOLN
INTRAMUSCULAR | Status: AC
  Filled 2022-10-06: qty 20

## 2022-10-06 MED ORDER — CEFAZOLIN SODIUM-DEXTROSE 2-3 GM-%(50ML) IV SOLR
INTRAVENOUS | Status: DC | PRN
  Administered 2022-10-06: 2 g via INTRAVENOUS

## 2022-10-06 MED ORDER — CHLORHEXIDINE GLUCONATE 0.12 % MT SOLN
OROMUCOSAL | Status: AC
  Administered 2022-10-06: 15 mL via OROMUCOSAL
  Filled 2022-10-06: qty 15

## 2022-10-06 MED ORDER — 0.9 % SODIUM CHLORIDE (POUR BTL) OPTIME
TOPICAL | Status: DC | PRN
  Administered 2022-10-06: 1000 mL

## 2022-10-06 MED ORDER — CEFAZOLIN SODIUM 1 G IJ SOLR
INTRAMUSCULAR | Status: AC
  Filled 2022-10-06: qty 20

## 2022-10-06 MED ORDER — CHLORHEXIDINE GLUCONATE 0.12 % MT SOLN: 15.0000 mL | Freq: Once | OROMUCOSAL | Status: AC

## 2022-10-06 MED ORDER — LOPERAMIDE HCL 2 MG PO CAPS: 2.0000 mg | ORAL_CAPSULE | Freq: Once | ORAL | Status: DC | PRN

## 2022-10-06 MED ORDER — BUPIVACAINE HCL (PF) 0.5 % IJ SOLN
INTRAMUSCULAR | Status: AC
  Filled 2022-10-06: qty 30

## 2022-10-06 MED ORDER — MIDAZOLAM HCL 2 MG/2ML IJ SOLN
INTRAMUSCULAR | Status: AC
  Filled 2022-10-06: qty 2

## 2022-10-06 MED ORDER — FENTANYL CITRATE (PF) 100 MCG/2ML IJ SOLN: 25.0000 ug | INTRAMUSCULAR | Status: DC | PRN

## 2022-10-06 MED ORDER — LACTATED RINGERS IV SOLN: INTRAVENOUS | Status: DC

## 2022-10-06 MED ORDER — ORAL CARE MOUTH RINSE: 15.0000 mL | Freq: Once | OROMUCOSAL | Status: AC

## 2022-10-06 SURGICAL SUPPLY — 41 items
BAG COUNTER SPONGE SURGICOUNT (BAG) ×1 IMPLANT
BLADE SURG 15 STRL LF DISP TIS (BLADE) IMPLANT
BLADE SURG 15 STRL SS (BLADE)
BNDG ELASTIC 4X5.8 VLCR STR LF (GAUZE/BANDAGES/DRESSINGS) IMPLANT
BNDG GAUZE DERMACEA FLUFF 4 (GAUZE/BANDAGES/DRESSINGS) ×1 IMPLANT
CHLORAPREP W/TINT 26 (MISCELLANEOUS) IMPLANT
COVER SURGICAL LIGHT HANDLE (MISCELLANEOUS) ×1 IMPLANT
CUFF TOURN SGL QUICK 18X4 (TOURNIQUET CUFF) ×1 IMPLANT
CUFF TOURN SGL QUICK 24 (TOURNIQUET CUFF)
CUFF TRNQT CYL 24X4X16.5-23 (TOURNIQUET CUFF) IMPLANT
ELECT REM PT RETURN 9FT ADLT (ELECTROSURGICAL)
ELECTRODE REM PT RTRN 9FT ADLT (ELECTROSURGICAL) IMPLANT
GAUZE PAD ABD 8X10 STRL (GAUZE/BANDAGES/DRESSINGS) ×1 IMPLANT
GAUZE SPONGE 4X4 12PLY STRL (GAUZE/BANDAGES/DRESSINGS) ×1 IMPLANT
GAUZE XEROFORM 1X8 LF (GAUZE/BANDAGES/DRESSINGS) ×1 IMPLANT
GAUZE XEROFORM 5X9 LF (GAUZE/BANDAGES/DRESSINGS) IMPLANT
GLOVE BIO SURGEON STRL SZ8 (GLOVE) ×1 IMPLANT
GLOVE BIOGEL PI IND STRL 8 (GLOVE) ×1 IMPLANT
GOWN STRL REUS W/ TWL LRG LVL3 (GOWN DISPOSABLE) ×2 IMPLANT
GOWN STRL REUS W/TWL LRG LVL3 (GOWN DISPOSABLE) ×2
KIT BASIN OR (CUSTOM PROCEDURE TRAY) ×1 IMPLANT
KIT TURNOVER KIT B (KITS) ×1 IMPLANT
MICROMATRIX 500MG (Tissue) ×1 IMPLANT
NDL PRECISIONGLIDE 27X1.5 (NEEDLE) ×1 IMPLANT
NEEDLE PRECISIONGLIDE 27X1.5 (NEEDLE) ×1 IMPLANT
NS IRRIG 1000ML POUR BTL (IV SOLUTION) ×1 IMPLANT
PACK ORTHO EXTREMITY (CUSTOM PROCEDURE TRAY) ×1 IMPLANT
PAD ARMBOARD 7.5X6 YLW CONV (MISCELLANEOUS) ×2 IMPLANT
PAD CAST 4YDX4 CTTN HI CHSV (CAST SUPPLIES) ×1 IMPLANT
PADDING CAST COTTON 4X4 STRL (CAST SUPPLIES) ×1
SOL PREP POV-IOD 4OZ 10% (MISCELLANEOUS) ×1 IMPLANT
SOLUTION PARTIC MCRMTRX 500MG (Tissue) IMPLANT
STAPLER VISISTAT 35W (STAPLE) IMPLANT
SUT PROLENE 4 0 PS 2 18 (SUTURE) IMPLANT
SUT VIC AB 3-0 PS2 18 (SUTURE) IMPLANT
SUT VICRYL 4-0 PS2 18IN ABS (SUTURE) IMPLANT
SYR CONTROL 10ML LL (SYRINGE) ×1 IMPLANT
TOWEL GREEN STERILE (TOWEL DISPOSABLE) ×1 IMPLANT
TOWEL GREEN STERILE FF (TOWEL DISPOSABLE) ×1 IMPLANT
TUBE CONNECTING 12X1/4 (SUCTIONS) ×1 IMPLANT
YANKAUER SUCT BULB TIP NO VENT (SUCTIONS) IMPLANT

## 2022-10-06 NOTE — Brief Op Note (Signed)
10/06/2022  2:43 PM  PATIENT:  Amanda Mendez  66 y.o. female  PRE-OPERATIVE DIAGNOSIS:  Infection  POST-OPERATIVE DIAGNOSIS:  gangrene  PROCEDURE:  Procedure(s): AMPUTATION LEFT FIFTH TOE (Left)  SURGEON:  Surgeon(s) and Role:    Edrick Kins, DPM - Primary  PHYSICIAN ASSISTANT:   ASSISTANTS: none   ANESTHESIA:   local and IV sedation  EBL:  3 mL   BLOOD ADMINISTERED:none  DRAINS: none   LOCAL MEDICATIONS USED:  MARCAINE   , LIDOCAINE , and Amount: 8 ml  SPECIMEN:  No Specimen  DISPOSITION OF SPECIMEN:  N/A  COUNTS:  YES  TOURNIQUET:  * No tourniquets in log *  DICTATION: .Dragon Dictation  PLAN OF CARE: Admit to inpatient   PATIENT DISPOSITION:  PACU - hemodynamically stable.   Delay start of Pharmacological VTE agent (>24hrs) due to surgical blood loss or risk of bleeding: no

## 2022-10-06 NOTE — Progress Notes (Addendum)
ANTICOAGULATION CONSULT NOTE   Pharmacy Consult for heparin Indication: chest pain/ACS  Allergies  Allergen Reactions   Dapagliflozin Itching   Codeine Nausea And Vomiting    But has tolerated morphine    Patient Measurements: Height: '5\' 1"'$  (154.9 cm) Weight: 84.8 kg (186 lb 14.4 oz) IBW/kg (Calculated) : 47.8 Heparin Dosing Weight: 65 kg  Vital Signs: Temp: 97.9 F (36.6 C) (10/27 2343) Temp Source: Oral (10/27 2343) BP: 154/92 (10/27 2343) Pulse Rate: 74 (10/27 2343)  Labs: Recent Labs    10/03/22 1046 10/04/22 0132 10/04/22 1055 10/05/22 0222 10/06/22 0110  HGB  --  10.3*  --  9.8* 11.6*  HCT  --  31.2*  --  30.0* 34.7*  PLT  --  286  --  283 231  APTT 23*  --   --   --   --   LABPROT 12.7  --   --   --   --   INR 1.0  --   --   --   --   HEPARINUNFRC  --   --  0.15* 0.26* 0.25*  CREATININE  --  1.46*  --  1.36* 1.15*     Estimated Creatinine Clearance: 46.9 mL/min (A) (by C-G formula based on SCr of 1.15 mg/dL (H)).   Medical History: Past Medical History:  Diagnosis Date   Anginal pain ([place])    Coronary artery disease    angioplasty 9/13   Exertional dyspnea    "usually; today it was with nothing" (11/03/2012)   GERD (gastroesophageal reflux disease)    Hypercholesterolemia with hypertriglyceridemia    Hypertension    Kidney stones    "I've had them 5 times; lithotripsy 1st time; flushed out  all the others" (11/03/2012)   Obesity (BMI 30-39.9)    Pneumonia 2012   Sinus headache    "weekly" (11/03/2012)   Type II diabetes mellitus ([place]) 2007     Assessment: 66 year old female with unstable angina presented with chest pressure. Pharmacy consulted for management of heparin infusion for ACS. No anticoagulation noted PTA. Pharmacy consulted to resume heparin gtt now s/p cardiac catheterization on 10/25 and angiogram on 10/27.   Patient underwent amputation for gangrenous left fifth toe 10/28. Ok to restart anticoagulation per podiatry.  Cardiothoracic plan tentative CABG 10/31. Heparin level 0.44 on drip rate 1300 units/hr therapeutic. Hgb 11.6 and plt 231. No s/sx bleeding noted in chart.    Goal of Therapy:  Heparin level 0.3-0.7 units/ml Monitor platelets by anticoagulation protocol: Yes   Plan:  Restart heparin infusion at 1300 units/hr  Monitor daily heparin level and CBC Continue to monitor H&H     Gena Fray, PharmD PGY1 Pharmacy Resident   10/06/2022 3:35 PM

## 2022-10-06 NOTE — Anesthesia Preprocedure Evaluation (Addendum)
Anesthesia Evaluation  Patient identified by MRN, date of birth, ID band Patient awake    Reviewed: Allergy & Precautions, NPO status , Patient's Chart, lab work & pertinent test results  Airway Mallampati: II  TM Distance: >3 FB Neck ROM: Full    Dental no notable dental hx.    Pulmonary neg pulmonary ROS, former smoker,    Pulmonary exam normal        Cardiovascular hypertension, + angina with exertion and at rest + CAD and + Cardiac Stents (2013)   Rhythm:Regular Rate:Normal  Cath 10/23:   1st Mrg lesion is 10% stenosed. 1.  Severe multivessel disease consisting of chronic total occlusion of right coronary artery (which has a high anterior takeoff), proximal LAD, and first diagonal disease. 2.  Patent first obtuse marginal stent. 3.  LVEDP of 26 mmHg.  Recommendation: Cardiothoracic surgery consultation; results discussed with Dr. Harl Bowie.  Nuc study: Small, reversible, moderate defect present in the basal inferolateral segment consistent with ischemia. EF drops with stress (68->63%). TID is present (1.19). Severely reduced coronary flow reserve (1.38). Findings are concerning for either balanced ischemia in the setting of 3-vessel CAD versus microvascular dysfunction. The patient reported chest pain on and off for the past 4 days occuring at rest. She was directed to the ER for work-up of possible unstable angina. .  LV perfusion is abnormal. There is evidence of ischemia. There is no evidence of infarction. Defect 1: There is a small defect with moderate reduction in uptake present in the basal inferolateral location(s) that is reversible. There is normal wall motion in the defect area. Consistent with ischemia. Marland Kitchen  Rest left ventricular function is normal. Rest EF: 68 %. Stress left ventricular function is normal. Stress EF: 63 %. End diastolic cavity size is normal. .  Myocardial blood flow was computed to be 1.37m/g/min at  rest and 1.542mg/min at stress. Global myocardial blood flow reserve was 1.38 and was highly abnormal. .  Coronary calcium was absent on the attenuation correction CT images. .  Findings are consistent with ischemia. The study is high risk.   ECHO 10/23 1. Left ventricular ejection fraction, by estimation, is 70 to 75%. The  left ventricle has hyperdynamic function. The left ventricle has no  regional wall motion abnormalities. There is mild concentric left  ventricular hypertrophy. Left ventricular  diastolic parameters are consistent with Grade I diastolic dysfunction  (impaired relaxation).  2. Right ventricular systolic function is normal. The right ventricular  size is normal.  3. The mitral valve is normal in structure. No evidence of mitral valve  regurgitation. No evidence of mitral stenosis.  4. The aortic valve is normal in structure. Aortic valve regurgitation is  not visualized. No aortic stenosis is present.  5. The inferior vena cava is normal in size with greater than 50%  respiratory variability, suggesting right atrial pressure of 3 mmHg.    Neuro/Psych  Headaches, negative psych ROS   GI/Hepatic Neg liver ROS, GERD  Medicated,  Endo/Other  diabetes, Poorly Controlled, Type 2, Oral Hypoglycemic Agents  Renal/GU CRFRenal disease  negative genitourinary   Musculoskeletal Gangrenous toe   Abdominal Normal abdominal exam  (+)   Peds  Hematology negative hematology ROS (+)   Anesthesia Other Findings   Reproductive/Obstetrics                            Anesthesia Physical Anesthesia Plan  ASA: 4  Anesthesia Plan: MAC  Post-op Pain Management:    Induction:   PONV Risk Score and Plan: 2 and Propofol infusion and Treatment may vary due to age or medical condition  Airway Management Planned: Simple Face Mask, Natural Airway and Nasal Cannula  Additional Equipment: None  Intra-op Plan:   Post-operative Plan:    Informed Consent: I have reviewed the patients History and Physical, chart, labs and discussed the procedure including the risks, benefits and alternatives for the proposed anesthesia with the patient or authorized representative who has indicated his/her understanding and acceptance.     Dental advisory given  Plan Discussed with: CRNA  Anesthesia Plan Comments:         Anesthesia Quick Evaluation

## 2022-10-06 NOTE — Interval H&P Note (Signed)
History and Physical Interval Note:  10/06/2022 1:55 PM  Amanda Mendez  has presented today for surgery, with the diagnosis of Infection.  The various methods of treatment have been discussed with the patient and family. After consideration of risks, benefits and other options for treatment, the patient has consented to  Procedure(s): AMPUTATION LEFT FIFTH TOE (Left) as a surgical intervention.  The patient's history has been reviewed, patient examined, no change in status, stable for surgery.  I have reviewed the patient's chart and labs.  Questions were answered to the patient's satisfaction.     Edrick Kins

## 2022-10-06 NOTE — Progress Notes (Signed)
PHARMACIST LIPID MONITORING   Amanda Mendez is a 66 y.o. female admitted on 10/02/2022 with chest pain/ACS.  Pharmacy has been consulted to optimize lipid-lowering therapy with the indication of secondary prevention for clinical ASCVD.  Recent Labs:  Lipid Panel (last 6 months):   Lab Results  Component Value Date   CHOL 160 10/06/2022   TRIG 260 (H) 10/06/2022   HDL 46 10/06/2022   CHOLHDL 3.5 10/06/2022   VLDL 52 (H) 10/06/2022   LDLCALC 62 10/06/2022    Hepatic function panel (last 6 months):   Lab Results  Component Value Date   AST 29 10/03/2022   ALT 26 10/03/2022   ALKPHOS 73 10/03/2022   BILITOT 0.6 10/03/2022    SCr (since admission):   Serum creatinine: 1.15 mg/dL (H) 10/06/22 0110 Estimated creatinine clearance: 46.9 mL/min (A)  Current therapy and lipid therapy tolerance Current lipid-lowering therapy: rosuvastatin Previous lipid-lowering therapies (if applicable): N/A Documented or reported allergies or intolerances to lipid-lowering therapies (if applicable): None  Assessment:   Patient agrees with changes to lipid-lowering therapy  Plan:    1.Statin intensity (high intensity recommended for all patients regardless of the LDL):  No statin changes. The patient is already on a high intensity statin.  2.Add ezetimibe (if any one of the following):   Add zetia. On a high intensity statin with LDL > 70.   3.Refer to lipid clinic:   No  4.Follow-up with:  Primary care provider - Redmond School, MD   5.Follow-up labs after discharge:  Changes in lipid therapy were made. Check a lipid panel in 8-12 weeks then annually.       Gena Fray, PharmD PGY1 Pharmacy Resident   10/06/2022 3:45 PM

## 2022-10-06 NOTE — Progress Notes (Signed)
PROGRESS NOTE   Amanda Mendez  FGH:[id number] DOB: 1956-01-22 DOA: 10/02/2022 PCP: Redmond School, MD  Brief Narrative:   66 year old white female known DM TY 2, HTN CAD PCI 08/15/2012 HFpEF EF 65-70% CKD 3 AA MGUS prior severe left lower extremity pain secondary to impinged nerve of lumbar spine. Seen by cardiology September 2023 Dr. Percival Spanish endorsing chest pain + SOB While undergoing cardiac perfusion study at [facility] 10/24 started having chest pain--PET = balanced ischemia MVD +/- microvascular disease and ST-T wave changes during study Work-up = slight elevation troponin 22, EKG no ST T wave changes-started on nitro gtt. after consultation with cardiology--- also found to have necrotic toe left little 10/26 cardiac cath-chronic total occlusion RCA proximal LAD first diagonal patent first obtuse marginal-cardiothoracic surgery consulted--CT PE study negative for PE, echocardiogram-70-75% with grade 1 DD 10/26 vascular surgery consulted-planning on doing angiogram 10/27 underwent angiogram, podiatry consulted   Hospital-Problem based course  Unstable angina with multivessel disease on cath 10/26 CAD and PCI in 08/15/2012 Cardiology foll-heparin as per them, aspirin 81 mg, metoprolol 50 twice daily, nitroglycerin drip stopped and now on sublingual, Crestor 40 Cardiothoracic plan tentative CABG 10/31  Dry gangrene left fifth toe Angiogram 10/27 shows reasonable flow  Dr. Amalia Hailey podiatry planning amputation 10/28  1 episode of diarrhea overnight 10/28 Monitor trends, work-up if persistent--Imodium given x1  ATN secondary to ARB/contrast load superimposed on CKD 3 AA Continues saline 75 cc/h--Creatinine has improved Holding Hyzaar 100-25, on discharge use lower dose or separate agent  Elevated D-dimer CTA chest negative for PE depression  Hypokalemia --resolved  DM TY 2 >A1c is >11 CBGs 158-242 Amaryl 2 mg 3 times daily on hold will resume after all procedures -continue  sliding scale for now,  -Increase Levemir 12-->16 units for now with sliding scale  Depression  continue Cymbalta 30 daily    DVT prophylaxis: Continues on IV heparin therapeutic dosing Code Status: Full Family Communication: None present at the bedside Disposition:  Status is: Inpatient Remains inpatient appropriate because: Will need evaluation by cardiothoracic surgery and will need angiogram and potential little toe amputation on the left side   Consultants:  Cardiology Cardiothoracic surgery Vascular surgery  Procedures:  Cardiac cath 10/25 conclusion      1st Mrg lesion is 10% stenosed.   1.  Severe multivessel disease consisting of chronic total occlusion of right coronary artery (which has a high anterior takeoff), proximal LAD, and first diagonal disease. 2.  Patent first obtuse marginal stent. 3.  LVEDP of 26 mmHg.  Angiogram 10/27 OPERATIVE FINDINGS:  Terminal aorta and iliac arteries: Ulcerated plaque in infrarenal aorta No flow limiting stenosis   Left lower extremity: Common femoral artery: patent without hemodynamically significant stenosis.  Profunda femoris artery: patent without hemodynamically significant stenosis.   Superficial femoral artery: patent without hemodynamically significant stenosis.  Popliteal artery: patent without hemodynamically significant stenosis.  Anterior tibial artery: disease about the origin, but patent without hemodynamically significant stenosis.  Tibioperoneal trunk: disease about the origin, but patent without hemodynamically significant stenosis.  Peroneal artery: disease about the origin, but patent without hemodynamically significant stenosis.  Posterior tibial artery: disease about the origin, but patent without hemodynamically significant stenosis.  Pedal circulation: fills via PT / peroneal  Antimicrobials: None currently   Subjective:  Doing fair sitting up in the bed and ready for surgery Understands the need  for the same No chest pain No fever No nausea   Objective: Vitals:   10/05/22 2042 10/05/22 2343 10/06/22  0500 10/06/22 0824  BP: (!) 156/82 (!) 154/92 (!) 158/67 (!) 158/78  Pulse: 89 74 81 83  Resp: (!) '23 19 20 17  '$ Temp: 98.2 F (36.8 C) 97.9 F (36.6 C) 98 F (36.7 C) 98.4 F (36.9 C)  TempSrc: Oral Oral Oral Oral  SpO2: 94% 98% 96% 100%  Weight:      Height:        Intake/Output Summary (Last 24 hours) at 10/06/2022 1030 Last data filed at 10/06/2022 4765 Gross per 24 hour  Intake 2243.72 ml  Output 900 ml  Net 1343.72 ml    Filed Weights   10/03/22 1038 10/04/22 0610 10/05/22 0346  Weight: 80.3 kg 81.6 kg 84.8 kg    Examination:  EOMI NCAT no focal deficit Chest clear no added sound Abdomen soft no rebound ROM is intact I did not examine extremities today S1-S2 no murmur seems to be normal sinus rhythm No pretibial edema  Data Reviewed: personally reviewed   CBC    Component Value Date/Time   WBC 8.4 10/06/2022 0110   RBC 3.77 (L) 10/06/2022 0110   HGB 11.6 (L) 10/06/2022 0110   HCT 34.7 (L) 10/06/2022 0110   PLT 231 10/06/2022 0110   MCV 92.0 10/06/2022 0110   MCH 30.8 10/06/2022 0110   MCHC 33.4 10/06/2022 0110   RDW 12.9 10/06/2022 0110   LYMPHSABS 2.4 10/03/2022 0516   MONOABS 1.1 (H) 10/03/2022 0516   EOSABS 0.3 10/03/2022 0516   BASOSABS 0.1 10/03/2022 0516      Latest Ref Rng & Units 10/06/2022    1:10 AM 10/05/2022    2:22 AM 10/04/2022    1:32 AM  CMP  Glucose 70 - 99 mg/dL 193  231  208   BUN 8 - 23 mg/dL '14  20  29   '$ Creatinine 0.44 - 1.00 mg/dL 1.15  1.36  1.46   Sodium 135 - 145 mmol/L 138  137  137   Potassium 3.5 - 5.1 mmol/L 4.1  4.3  3.9   Chloride 98 - 111 mmol/L 107  107  104   CO2 22 - 32 mmol/L '19  24  23   '$ Calcium 8.9 - 10.3 mg/dL 9.2  8.8  8.8      Radiology Studies:  summarized as above  Scheduled Meds:  aspirin  81 mg Oral Daily   DULoxetine  30 mg Oral Daily   insulin aspart  0-15 Units Subcutaneous  TID AC & HS   insulin glargine-yfgn  16 Units Subcutaneous Daily   metoprolol tartrate  50 mg Oral BID   pantoprazole  40 mg Oral Daily   rosuvastatin  40 mg Oral Daily   sodium chloride flush  3 mL Intravenous Q12H   sodium chloride flush  3 mL Intravenous Q12H   sodium chloride flush  3 mL Intravenous Q12H   Continuous Infusions:  sodium chloride Stopped (10/05/22 1703)   sodium chloride     sodium chloride     heparin 1,300 Units/hr (10/06/22 0355)     LOS: 4 days   Time spent: [place], MD [facility] To contact the attending provider between 7A-7P or the covering provider during after hours 7P-7A, please log into the web site [email or web address] and access using universal [place] password for that web site. If you do not have the password, please call the hospital operator.  10/06/2022, 10:30 AM

## 2022-10-06 NOTE — Plan of Care (Signed)
  Problem: Education: Goal: Ability to describe self-care measures that may prevent or decrease complications (Diabetes Survival Skills Education) will improve Outcome: Progressing Goal: Individualized Educational Video(s) Outcome: Progressing   Problem: Coping: Goal: Ability to adjust to condition or change in health will improve Outcome: Progressing   Problem: Fluid Volume: Goal: Ability to maintain a balanced intake and output will improve Outcome: Progressing   Problem: Health Behavior/Discharge Planning: Goal: Ability to identify and utilize available resources and services will improve Outcome: Progressing Goal: Ability to manage health-related needs will improve Outcome: Progressing   Problem: Metabolic: Goal: Ability to maintain appropriate glucose levels will improve Outcome: Progressing   Problem: Nutritional: Goal: Maintenance of adequate nutrition will improve Outcome: Progressing Goal: Progress toward achieving an optimal weight will improve Outcome: Progressing   Problem: Skin Integrity: Goal: Risk for impaired skin integrity will decrease Outcome: Progressing   Problem: Tissue Perfusion: Goal: Adequacy of tissue perfusion will improve Outcome: Progressing   Problem: Education: Goal: Understanding of CV disease, CV risk reduction, and recovery process will improve Outcome: Progressing Goal: Individualized Educational Video(s) Outcome: Progressing   Problem: Activity: Goal: Ability to return to baseline activity level will improve Outcome: Progressing   Problem: Cardiovascular: Goal: Ability to achieve and maintain adequate cardiovascular perfusion will improve Outcome: Progressing Goal: Vascular access site(s) Level 0-1 will be maintained Outcome: Progressing   Problem: Health Behavior/Discharge Planning: Goal: Ability to safely manage health-related needs after discharge will improve Outcome: Progressing   Problem: Education: Goal: Knowledge  of General Education information will improve Description: Including pain rating scale, medication(s)/side effects and non-pharmacologic comfort measures Outcome: Progressing   Problem: Health Behavior/Discharge Planning: Goal: Ability to manage health-related needs will improve Outcome: Progressing   Problem: Clinical Measurements: Goal: Ability to maintain clinical measurements within normal limits will improve Outcome: Progressing Goal: Will remain free from infection Outcome: Progressing Goal: Diagnostic test results will improve Outcome: Progressing Goal: Respiratory complications will improve Outcome: Progressing Goal: Cardiovascular complication will be avoided Outcome: Progressing   Problem: Activity: Goal: Risk for activity intolerance will decrease Outcome: Progressing   Problem: Nutrition: Goal: Adequate nutrition will be maintained Outcome: Progressing   Problem: Coping: Goal: Level of anxiety will decrease Outcome: Progressing   Problem: Elimination: Goal: Will not experience complications related to bowel motility Outcome: Progressing Goal: Will not experience complications related to urinary retention Outcome: Progressing   Problem: Pain Managment: Goal: General experience of comfort will improve Outcome: Progressing   Problem: Safety: Goal: Ability to remain free from injury will improve Outcome: Progressing   Problem: Skin Integrity: Goal: Risk for impaired skin integrity will decrease Outcome: Progressing

## 2022-10-06 NOTE — Progress Notes (Signed)
Notified by Amalia Hailey MD to restart heparin gtt at 54.   Daymon Larsen, RN

## 2022-10-06 NOTE — Interval H&P Note (Signed)
History and Physical Interval Note:  10/06/2022 12:25 PM  Amanda Mendez  has presented today for surgery, with the diagnosis of Infection.  The various methods of treatment have been discussed with the patient and family. After consideration of risks, benefits and other options for treatment, the patient has consented to  Procedure(s): AMPUTATION LEFT FIFTH TOE (Left) as a surgical intervention.  The patient's history has been reviewed, patient examined, no change in status, stable for surgery.  I have reviewed the patient's chart and labs.  Questions were answered to the patient's satisfaction.     Edrick Kins

## 2022-10-06 NOTE — Anesthesia Procedure Notes (Addendum)
Procedure Name: MAC Date/Time: 10/06/2022 2:11 PM  Performed by: Barrington Ellison, CRNAPre-anesthesia Checklist: Patient identified, Emergency Drugs available, Suction available, Patient being monitored and Timeout performed Patient Re-evaluated:Patient Re-evaluated prior to induction Oxygen Delivery Method: Simple face mask

## 2022-10-06 NOTE — Progress Notes (Addendum)
  Progress Note    10/06/2022 9:12 AM 1 Day Post-Op  Subjective:  resting comfortably, L 5th toe a little sore   Vitals:   10/06/22 0500 10/06/22 0824  BP: (!) 158/67 (!) 158/78  Pulse: 81 83  Resp: 20 17  Temp: 98 F (36.7 C) 98.4 F (36.9 C)  SpO2: 96% 100%    Physical Exam: Lungs:  nonlabored Incisions:  R groin soft without hematoma Extremities:  LLE well perfused with good cap refill. L 5th toe dry gangrene unchanged  CBC    Component Value Date/Time   WBC 8.4 10/06/2022 0110   RBC 3.77 (L) 10/06/2022 0110   HGB 11.6 (L) 10/06/2022 0110   HCT 34.7 (L) 10/06/2022 0110   PLT 231 10/06/2022 0110   MCV 92.0 10/06/2022 0110   MCH 30.8 10/06/2022 0110   MCHC 33.4 10/06/2022 0110   RDW 12.9 10/06/2022 0110   LYMPHSABS 2.4 10/03/2022 0516   MONOABS 1.1 (H) 10/03/2022 0516   EOSABS 0.3 10/03/2022 0516   BASOSABS 0.1 10/03/2022 0516    BMET    Component Value Date/Time   NA 138 10/06/2022 0110   K 4.1 10/06/2022 0110   CL 107 10/06/2022 0110   CO2 19 (L) 10/06/2022 0110   GLUCOSE 193 (H) 10/06/2022 0110   BUN 14 10/06/2022 0110   CREATININE 1.15 (H) 10/06/2022 0110   CREATININE 0.96 09/03/2012 1406   CALCIUM 9.2 10/06/2022 0110   GFRNONAA 52 (L) 10/06/2022 0110   GFRAA >90 05/29/2014 0450    INR    Component Value Date/Time   INR 1.0 10/03/2022 1046     Intake/Output Summary (Last 24 hours) at 10/06/2022 0912 Last data filed at 10/06/2022 1497 Gross per 24 hour  Intake 2243.72 ml  Output 900 ml  Net 1343.72 ml     Assessment/Plan:  66 y.o. female is 1 day post op, s/p:  LLE angiogram   -LLE warm and well perfused with good capillary refill -Plan is for L 5th toe amputation today with podiatry   Vicente Serene, PA-C [facility] [phone] 10/06/2022 9:12 AM  VASCULAR STAFF ADDENDUM: I have independently interviewed and examined the patient. I agree with the above.  Optimized from a vascular standpoint.  Agree  with toe amputation. No evidence of hematoma or access complication. Recommend medical therapy for PAD: ASA '81mg'$  PO QD, Statin therapy.  Follow up with me or Dr. Trula Slade on as-needed basis.  Yevonne Aline. Stanford Breed, MD [facility] Phone Number: [phone] 10/06/2022 10:14 AM

## 2022-10-06 NOTE — Op Note (Signed)
   OPERATIVE REPORT Patient name: Amanda Mendez MRN: [id number] DOB: [birth year]  DOS: 10/06/22  Preop Dx: Postop Dx: same  Procedure:  1. ***  Surgeon: Edrick Kins DPM  Anesthesia: 50-50 mixture of 2% lidocaine plain with 0.5% Marcaine plain totaling *** infiltrated in the patient's {RT/LT/BL}   Hemostasis: Calf tourniquet inflated to a pressure of 251mHg after esmarch exsanguination   EBL: *** mL Materials: *** Injectables: *** Pathology: ***  Condition: The patient tolerated the procedure and anesthesia well. No complications noted or reported   Justification for procedure: The patient is a 66y.o. '@GENDER'$ @ who presents today for surgical correction of ***. All conservative modalities of been unsuccessful in providing any sort of satisfactory alleviation of symptoms with the patient. The patient was told benefits as well as possible side effects of the surgery. The patient consented for surgical correction. The patient consent form was reviewed. All patient questions were answered. No guarantees were expressed or implied. The patient and the surgeon both signed the patient consent form with the witness present and placed in the patient's chart.   Procedure in Detail: The patient was brought to the operating room, placed in the operating table in the supine position at which time an aseptic scrub and drape were performed about the patient's respective lower extremity after anesthesia was induced as described above. Attention was then directed to the surgical area where procedure number one commenced.  Procedure #1:   Dry sterile compressive dressings were then applied to all previously mentioned incision sites about the patient's lower extremity. The tourniquet which was used for hemostasis was deflated. All normal neurovascular responses including pink color and warmth returned all the digits of patient's lower extremity.  The patient was then transferred from the operating  room to the recovery room having tolerated the procedure and anesthesia well. All vital signs are stable. After a brief stay in the recovery room the patient was {readmitted to inpatient room/discharged} with adequate prescriptions for analgesia. Verbal as well as written instructions were provided for the patient regarding wound care. The patient is to keep the dressings clean dry and intact until they are to follow surgeon Dr. BDaylene Katayamain the office upon discharge.   BEdrick Kins DPM [facility]  Dr. BEdrick Kins DPM    [address] [place] [id number]               Office [phone] Fax [phone]

## 2022-10-06 NOTE — Transfer of Care (Signed)
Immediate Anesthesia Transfer of Care Note  Patient: Amanda Mendez  Procedure(s) Performed: AMPUTATION LEFT FIFTH TOE (Left: Toe)  Patient Location: PACU  Anesthesia Type:MAC and Regional  Level of Consciousness: drowsy  Airway & Oxygen Therapy: Patient Spontanous Breathing  Post-op Assessment: Report given to RN  Post vital signs: Reviewed and stable  Last Vitals:  Vitals Value Taken Time  BP 117/72 10/06/22 1449  Temp    Pulse 79 10/06/22 1449  Resp 14 10/06/22 1449  SpO2 94 % 10/06/22 1449  Vitals shown include unvalidated device data.  Last Pain:  Vitals:   10/06/22 1302  TempSrc: Oral  PainSc:       Patients Stated Pain Goal: 2 (83/25/49 8264)  Complications: No notable events documented.

## 2022-10-06 NOTE — Anesthesia Postprocedure Evaluation (Signed)
Anesthesia Post Note  Patient: Amanda Mendez  Procedure(s) Performed: AMPUTATION LEFT FIFTH TOE (Left: Toe)     Patient location during evaluation: PACU Anesthesia Type: MAC Level of consciousness: awake and alert Pain management: pain level controlled Vital Signs Assessment: post-procedure vital signs reviewed and stable Respiratory status: spontaneous breathing, nonlabored ventilation, respiratory function stable and patient connected to nasal cannula oxygen Cardiovascular status: stable and blood pressure returned to baseline Postop Assessment: no apparent nausea or vomiting Anesthetic complications: no   No notable events documented.  Last Vitals:  Vitals:   10/06/22 1505 10/06/22 1520  BP: 121/60 (!) 142/79  Pulse: 77 83  Resp: 20 15  Temp: 36.7 C 36.6 C  SpO2: 92% 97%    Last Pain:  Vitals:   10/06/22 1520  TempSrc: Oral  PainSc: 0-No pain                 Belenda Cruise P Aljean Horiuchi

## 2022-10-06 NOTE — Progress Notes (Signed)
Pt back from OR, VSS, Left foot wrapped clean dry and intact     10/06/22 1520  Vitals  Temp 97.8 F (36.6 C)  Temp Source Oral  BP (!) 142/79  MAP (mmHg) 97  BP Location Right Arm  BP Method Automatic  Patient Position (if appropriate) Lying  Pulse Rate 83  Pulse Rate Source Monitor  ECG Heart Rate 85  Resp 15  Level of Consciousness  Level of Consciousness Alert  Oxygen Therapy  SpO2 97 %  O2 Device Room Air  O2 Flow Rate (L/min) 0 L/min  Pain Assessment  Pain Scale 0-10  Pain Score 0  MEWS Score  MEWS Temp 0  MEWS Systolic 0  MEWS Pulse 0  MEWS RR 0  MEWS LOC 0  MEWS Score 0  MEWS Score Color Nyoka Cowden

## 2022-10-07 DIAGNOSIS — R079 Chest pain, unspecified: Secondary | ICD-10-CM | POA: Diagnosis not present

## 2022-10-07 DIAGNOSIS — I200 Unstable angina: Principal | ICD-10-CM

## 2022-10-07 DIAGNOSIS — I96 Gangrene, not elsewhere classified: Secondary | ICD-10-CM | POA: Diagnosis not present

## 2022-10-07 LAB — CBC
HCT: 31.7 % — ABNORMAL LOW (ref 36.0–46.0)
Hemoglobin: 10.8 g/dL — ABNORMAL LOW (ref 12.0–15.0)
MCH: 31.8 pg (ref 26.0–34.0)
MCHC: 34.1 g/dL (ref 30.0–36.0)
MCV: 93.2 fL (ref 80.0–100.0)
Platelets: 297 10*3/uL (ref 150–400)
RBC: 3.4 MIL/uL — ABNORMAL LOW (ref 3.87–5.11)
RDW: 13 % (ref 11.5–15.5)
WBC: 7.5 10*3/uL (ref 4.0–10.5)
nRBC: 0 % (ref 0.0–0.2)

## 2022-10-07 LAB — GLUCOSE, CAPILLARY
Glucose-Capillary: 220 mg/dL — ABNORMAL HIGH (ref 70–99)
Glucose-Capillary: 216 mg/dL — ABNORMAL HIGH (ref 70–99)
Glucose-Capillary: 219 mg/dL — ABNORMAL HIGH (ref 70–99)
Glucose-Capillary: 195 mg/dL — ABNORMAL HIGH (ref 70–99)

## 2022-10-07 LAB — BASIC METABOLIC PANEL
Anion gap: 10 (ref 5–15)
BUN: 12 mg/dL (ref 8–23)
CO2: 22 mmol/L (ref 22–32)
Calcium: 9.1 mg/dL (ref 8.9–10.3)
Chloride: 107 mmol/L (ref 98–111)
Creatinine, Ser: 1.39 mg/dL — ABNORMAL HIGH (ref 0.44–1.00)
GFR, Estimated: 42 mL/min — ABNORMAL LOW (ref >60)
Glucose, Bld: 236 mg/dL — ABNORMAL HIGH (ref 70–99)
Potassium: 3.9 mmol/L (ref 3.5–5.1)
Sodium: 139 mmol/L (ref 135–145)

## 2022-10-07 LAB — HEPARIN LEVEL (UNFRACTIONATED): Heparin Unfractionated: 0.41 IU/mL (ref 0.30–0.70)

## 2022-10-07 NOTE — Progress Notes (Signed)
Progress Note  Patient Name: Amanda Mendez Date of Encounter: 10/07/2022  Primary Cardiologist:   Minus Breeding, MD   Subjective   No chest pain.  No SOB.   Inpatient Medications    Scheduled Meds:  aspirin  81 mg Oral Daily   DULoxetine  30 mg Oral Daily   ezetimibe  10 mg Oral Daily   insulin aspart  0-15 Units Subcutaneous TID AC & HS   insulin glargine-yfgn  16 Units Subcutaneous Daily   metoprolol tartrate  50 mg Oral BID   pantoprazole  40 mg Oral Daily   rosuvastatin  40 mg Oral Daily   sodium chloride flush  3 mL Intravenous Q12H   sodium chloride flush  3 mL Intravenous Q12H   sodium chloride flush  3 mL Intravenous Q12H   Continuous Infusions:  sodium chloride Stopped (10/05/22 1703)   sodium chloride     sodium chloride     heparin 1,300 Units/hr (10/06/22 1613)   PRN Meds: sodium chloride, sodium chloride, acetaminophen, acetaminophen, hydrALAZINE, hydrALAZINE, labetalol, loperamide, nitroGLYCERIN, ondansetron **OR** ondansetron (ZOFRAN) IV, ondansetron (ZOFRAN) IV, mouth rinse, sodium chloride flush, sodium chloride flush   Vital Signs    Vitals:   10/06/22 2322 10/07/22 0343 10/07/22 0824 10/07/22 1130  BP: (!) 154/78 (!) 170/80 (!) 162/73 (!) 169/78  Pulse: 88 78 81 72  Resp: '19 19 20 17  '$ Temp: 98.2 F (36.8 C) 98.3 F (36.8 C) 98.4 F (36.9 C) 98.4 F (36.9 C)  TempSrc: Oral Oral Oral Oral  SpO2: 94% 96% 98% 95%  Weight:      Height:        Intake/Output Summary (Last 24 hours) at 10/07/2022 1215 Last data filed at 10/07/2022 1132 Gross per 24 hour  Intake 1400 ml  Output 2003 ml  Net -603 ml   Filed Weights   10/04/22 0610 10/05/22 0346 10/06/22 1303  Weight: 81.6 kg 84.8 kg 84.8 kg    Telemetry    NSR - Personally Reviewed  ECG    NA - Personally Reviewed  Physical Exam   GEN: No acute distress.   Neck: No  JVD Cardiac: RRR, no murmurs, rubs, or gallops.  Respiratory: Clear  to auscultation bilaterally. GI:  Soft, nontender, non-distended  MS: No  edema; No deformity. Neuro:  Nonfocal  Psych: Normal affect   Labs    Chemistry Recent Labs  Lab 10/02/22 1529 10/03/22 0516 10/04/22 0132 10/05/22 0222 10/06/22 0110 10/07/22 0108  NA 135 138   < > 137 138 139  K 4.0 3.2*   < > 4.3 4.1 3.9  CL 95* 100   < > 107 107 107  CO2 28 27   < > 24 19* 22  GLUCOSE 271* 156*   < > 231* 193* 236*  BUN 32* 39*   < > '20 14 12  '$ CREATININE 0.97 1.50*   < > 1.36* 1.15* 1.39*  CALCIUM 9.0 9.1   < > 8.8* 9.2 9.1  PROT 7.6 6.6  --   --   --   --   ALBUMIN 3.8 3.3*  --   --   --   --   AST 43* 29  --   --   --   --   ALT 24 26  --   --   --   --   ALKPHOS 79 73  --   --   --   --   BILITOT 1.1 0.6  --   --   --   --  GFRNONAA >60 38*   < > 43* 52* 42*  ANIONGAP 12 11   < > '6 12 10   '$ < > = values in this interval not displayed.     Hematology Recent Labs  Lab 10/05/22 0222 10/06/22 0110 10/07/22 0108  WBC 8.5 8.4 7.5  RBC 3.17* 3.77* 3.40*  HGB 9.8* 11.6* 10.8*  HCT 30.0* 34.7* 31.7*  MCV 94.6 92.0 93.2  MCH 30.9 30.8 31.8  MCHC 32.7 33.4 34.1  RDW 12.7 12.9 13.0  PLT 283 231 297    Cardiac EnzymesNo results for input(s): "TROPONINI" in the last 168 hours. No results for input(s): "TROPIPOC" in the last 168 hours.   BNPNo results for input(s): "BNP", "PROBNP" in the last 168 hours.   DDimer  Recent Labs  Lab 10/02/22 1949  DDIMER 1.03*     Radiology    DG Foot Complete Left  Result Date: 10/06/2022 CLINICAL DATA:  Postoperative state EXAM: LEFT FOOT - COMPLETE 3+ VIEW COMPARISON:  None Available. FINDINGS: There is disarticulation of fifth toe. No fracture or dislocation is seen. Degenerative changes are noted with bony spurs in the distal interphalangeal joint of middle toe. Plantar spur is seen in calcaneus. IMPRESSION: Disarticulation of left fifth toe. No recent fracture or dislocation is seen. There are no focal lytic lesions. Electronically Signed   By: Elmer Picker  M.D.   On: 10/06/2022 17:06   PERIPHERAL VASCULAR CATHETERIZATION  Result Date: 10/05/2022 DATE OF SERVICE: 10/05/2022  PATIENT:  Amanda Mendez  66 y.o. female  PRE-OPERATIVE DIAGNOSIS:  Atherosclerosis of native arteries of left lower extremity causing gangrene  POST-OPERATIVE DIAGNOSIS:  Same  PROCEDURE:  1) Ultrasound guided right common femoral artery access 2) Aortogram 3) Left lower extremity angiogram with second order cannulation 4) Conscious sedation (23 minutes)  SURGEON:  Yevonne Aline. Stanford Breed, MD  ASSISTANT: none  ANESTHESIA:   local and IV sedation  ESTIMATED BLOOD LOSS: minimal  LOCAL MEDICATIONS USED:  LIDOCAINE  COUNTS: confirmed correct.  PATIENT DISPOSITION:  PACU - hemodynamically stable.  Delay start of Pharmacological VTE agent (>24hrs) due to surgical blood loss or risk of bleeding: no  INDICATION FOR PROCEDURE: Amanda Mendez is a 66 y.o. female with left fifth toe gangrene. After careful discussion of risks, benefits, and alternatives the patient was offered angiography.  The patient understood and wished to proceed.  OPERATIVE FINDINGS: Terminal aorta and iliac arteries: Ulcerated plaque in infrarenal aorta No flow limiting stenosis  Left lower extremity: Common femoral artery: patent without hemodynamically significant stenosis. Profunda femoris artery: patent without hemodynamically significant stenosis.  Superficial femoral artery: patent without hemodynamically significant stenosis. Popliteal artery: patent without hemodynamically significant stenosis. Anterior tibial artery: disease about the origin, but patent without hemodynamically significant stenosis. Tibioperoneal trunk: disease about the origin, but patent without hemodynamically significant stenosis. Peroneal artery: disease about the origin, but patent without hemodynamically significant stenosis. Posterior tibial artery: disease about the origin, but patent without hemodynamically significant stenosis. Pedal circulation:  fills via PT / peroneal  GLASS score. IP 0 / FP -0.  WIfI score. 2 / 0 / 0. Low risk of major amputation.  DESCRIPTION OF PROCEDURE: After identification of the patient in the pre-operative holding area, the patient was transferred to the operating room. The patient was positioned supine on the operating room table. Anesthesia was induced. The groins was prepped and draped in standard fashion. A surgical pause was performed confirming correct patient, procedure, and operative location.  The rght groin was anesthetized  with subcutaneous injection of 1% lidocaine. Using ultrasound guidance, the right common femoral artery was accessed with micropuncture technique. Fluoroscopy was used to confirm cannulation over the femoral head. The 31F sheath was upsized to 31F.  A Benson wire was advanced into the distal aorta. Over the wire an omni flush catheter was advanced to the level of L2. Aortogram was performed - see above for details.  The left common iliac artery was selected with an omniflush catheter and Benson guidewire. The wire was advanced into the common femoral artery. Over the wire the omni flush catheter was advanced into the external iliac artery. Selective angiography was performed - see above for details.  A mynx device was used to close the arteriotomy. Hemostasis was excellent upon completion.  Conscious sedation was administered with the use of IV fentanyl and midazolam under continuous physician and nurse monitoring.  Heart rate, blood pressure, and oxygen saturation were continuously monitored.  Total sedation time was 23 minutes  Upon completion of the case instrument and sharps counts were confirmed correct. The patient was transferred to the PACU in good condition. I was present for all portions of the procedure.  PLAN: ASA '81mg'$  PO QD. High intensity statin therapy. Recommend podiatry evaluation for consideration of toe amputation.  Yevonne Aline. Stanford Breed, MD [facility] Phone Number: [phone] 10/05/2022 1:54 PM     Cardiac Studies   CATH:  Diagnostic Dominance: Right  Intervention   ECHO:     1. Left ventricular ejection fraction, by estimation, is 70 to 75%. The  left ventricle has hyperdynamic function. The left ventricle has no  regional wall motion abnormalities. There is mild concentric left  ventricular hypertrophy. Left ventricular  diastolic parameters are consistent with Grade I diastolic dysfunction  (impaired relaxation).   2. Right ventricular systolic function is normal. The right ventricular  size is normal.   3. The mitral valve is normal in structure. No evidence of mitral valve  regurgitation. No evidence of mitral stenosis.   4. The aortic valve is normal in structure. Aortic valve regurgitation is  not visualized. No aortic stenosis is present.   5. The inferior vena cava is normal in size with greater than 50%  respiratory variability, suggesting right atrial pressure of 3 mmHg.   Patient Profile     66 y.o. female with a hx of CAD s/p stenting to OM in 2013, hypertension, diabetes, hyperlipidemia and intermittent renal insufficiency who is being seen 10/03/2022 for the evaluation of chest pain at the request of Dr. Cyd Silence  Assessment & Plan    CAD:   Plan for CABG this week.  Zetia added to max dose Lipitor.  Ultimately we need to make sure at discharge that we can get her meds as prescribed by the hospital service/PCP and or endocrinologist.  She said that she was doing well on Januvia but it is not covered.   Necrotic Fifth Toe:  Secondary to trauma.  Now status post amputation.    For questions or updates, please contact [place] Please consult [email or web address] for contact info under Cardiology/STEMI.   Signed, Minus Breeding, MD  10/07/2022, 12:15 PM

## 2022-10-07 NOTE — Progress Notes (Signed)
PROGRESS NOTE   Amanda Mendez  EGB:[id number] DOB: May 15, 1956 DOA: 10/02/2022 PCP: Redmond School, MD  Brief Narrative:   66 year old white female known DM TY 2, HTN CAD PCI 08/15/2012 HFpEF EF 65-70% CKD 3 AA MGUS prior severe left lower extremity pain secondary to impinged nerve of lumbar spine. Seen by cardiology September 2023 Dr. Percival Spanish endorsing chest pain + SOB While undergoing cardiac perfusion study at [facility] 10/24 started having chest pain--PET = balanced ischemia MVD +/- microvascular disease and ST-T wave changes during study Work-up = slight elevation troponin 22, EKG no ST T wave changes-started on nitro gtt. after consultation with cardiology--- also found to have necrotic toe left little 10/26 cardiac cath-chronic total occlusion RCA proximal LAD first diagonal patent first obtuse marginal-cardiothoracic surgery consulted--CT PE study negative for PE, echocardiogram-70-75% with grade 1 DD 10/26 vascular surgery consulted-planning on doing angiogram 10/27 underwent angiogram, podiatry consulted 10/28 amputation left fifth toe by podiatrist   Hospital-Problem based course  Unstable angina with multivessel disease on cath 10/26 CAD and PCI in 08/15/2012 Cardiology foll-heparin as per them, aspirin 81 mg, metoprolol 50 twice daily, nitroglycerin drip stopped and now on sublingual, Crestor 40 Cardiothoracic plan tentative CABG 10/31  Dry gangrene left fifth toe Angiogram 10/27 shows reasonable flow  Dr. Amalia Hailey podiatry planning amputation 10/28  1 episode of diarrhea overnight 10/28 Monitor trends, work-up if persistent--Imodium given x1  ATN secondary to ARB/contrast load superimposed on CKD 3 AA Continues saline 75 cc/h--Creatinine has improved Holding Hyzaar 100-25, on discharge use lower dose or separate agent  Elevated D-dimer CTA chest negative for PE depression  Hypokalemia --resolved  DM TY 2 >A1c is >11 CBGs 158-242 Amaryl 2 mg 3 times daily on hold  will resume after all procedures--could add thiglitazone if cost is an issue for SGLPT-1 agents -continue sliding scale for now,  -Increase Levemir 12-->16 units for now with sliding scale  Depression  continue Cymbalta 30 daily    DVT prophylaxis: Continues on IV heparin therapeutic dosing Code Status: Full Family Communication: None present at the bedside Disposition:  Status is: Inpatient Remains inpatient appropriate because: Will need evaluation by cardiothoracic surgery and will need angiogram and potential little toe amputation on the left side   Consultants:  Cardiology Cardiothoracic surgery Vascular surgery  Procedures:  Cardiac cath 10/25 conclusion      1st Mrg lesion is 10% stenosed.   1.  Severe multivessel disease consisting of chronic total occlusion of right coronary artery (which has a high anterior takeoff), proximal LAD, and first diagonal disease. 2.  Patent first obtuse marginal stent. 3.  LVEDP of 26 mmHg.  Angiogram 10/27 OPERATIVE FINDINGS:  Terminal aorta and iliac arteries: Ulcerated plaque in infrarenal aorta No flow limiting stenosis   Left lower extremity: Common femoral artery: patent without hemodynamically significant stenosis.  Profunda femoris artery: patent without hemodynamically significant stenosis.   Superficial femoral artery: patent without hemodynamically significant stenosis.  Popliteal artery: patent without hemodynamically significant stenosis.  Anterior tibial artery: disease about the origin, but patent without hemodynamically significant stenosis.  Tibioperoneal trunk: disease about the origin, but patent without hemodynamically significant stenosis.  Peroneal artery: disease about the origin, but patent without hemodynamically significant stenosis.  Posterior tibial artery: disease about the origin, but patent without hemodynamically significant stenosis.  Pedal circulation: fills via PT / peroneal  Antimicrobials: None  currently   Subjective:  Sitting up no distress looks well no cp, fever No new other concerns   Objective: Vitals:  10/06/22 2322 10/07/22 0343 10/07/22 0824 10/07/22 1130  BP: (!) 154/78 (!) 170/80 (!) 162/73 (!) 169/78  Pulse: 88 78 81 72  Resp: '19 19 20 17  '$ Temp: 98.2 F (36.8 C) 98.3 F (36.8 C) 98.4 F (36.9 C) 98.4 F (36.9 C)  TempSrc: Oral Oral Oral Oral  SpO2: 94% 96% 98% 95%  Weight:      Height:        Intake/Output Summary (Last 24 hours) at 10/07/2022 1234 Last data filed at 10/07/2022 1132 Gross per 24 hour  Intake 1400 ml  Output 2003 ml  Net -603 ml    Filed Weights   10/04/22 0610 10/05/22 0346 10/06/22 1303  Weight: 81.6 kg 84.8 kg 84.8 kg    Examination:  EOMI NCAT no focal deficit-thick neck Chest clear  Abdomen soft no rebound ROM is intact  S1-S2 no murmur seems to be normal sinus rhythm No pretibial edema  Data Reviewed: personally reviewed   CBC    Component Value Date/Time   WBC 7.5 10/07/2022 0108   RBC 3.40 (L) 10/07/2022 0108   HGB 10.8 (L) 10/07/2022 0108   HCT 31.7 (L) 10/07/2022 0108   PLT 297 10/07/2022 0108   MCV 93.2 10/07/2022 0108   MCH 31.8 10/07/2022 0108   MCHC 34.1 10/07/2022 0108   RDW 13.0 10/07/2022 0108   LYMPHSABS 2.4 10/03/2022 0516   MONOABS 1.1 (H) 10/03/2022 0516   EOSABS 0.3 10/03/2022 0516   BASOSABS 0.1 10/03/2022 0516      Latest Ref Rng & Units 10/07/2022    1:08 AM 10/06/2022    1:10 AM 10/05/2022    2:22 AM  CMP  Glucose 70 - 99 mg/dL 236  193  231   BUN 8 - 23 mg/dL '12  14  20   '$ Creatinine 0.44 - 1.00 mg/dL 1.39  1.15  1.36   Sodium 135 - 145 mmol/L 139  138  137   Potassium 3.5 - 5.1 mmol/L 3.9  4.1  4.3   Chloride 98 - 111 mmol/L 107  107  107   CO2 22 - 32 mmol/L '22  19  24   '$ Calcium 8.9 - 10.3 mg/dL 9.1  9.2  8.8      Radiology Studies:  summarized as above  Scheduled Meds:  aspirin  81 mg Oral Daily   DULoxetine  30 mg Oral Daily   ezetimibe  10 mg Oral Daily    insulin aspart  0-15 Units Subcutaneous TID AC & HS   insulin glargine-yfgn  16 Units Subcutaneous Daily   metoprolol tartrate  50 mg Oral BID   pantoprazole  40 mg Oral Daily   rosuvastatin  40 mg Oral Daily   sodium chloride flush  3 mL Intravenous Q12H   sodium chloride flush  3 mL Intravenous Q12H   sodium chloride flush  3 mL Intravenous Q12H   Continuous Infusions:  sodium chloride Stopped (10/05/22 1703)   sodium chloride     sodium chloride     heparin 1,300 Units/hr (10/06/22 1613)     LOS: 5 days   Time spent: [place], MD [facility] To contact the attending provider between 7A-7P or the covering provider during after hours 7P-7A, please log into the web site [email or web address] and access using universal [place] password for that web site. If you do not have the password, please call the hospital operator.  10/07/2022, 12:34 PM

## 2022-10-07 NOTE — Progress Notes (Signed)
ANTICOAGULATION CONSULT NOTE   Pharmacy Consult for heparin Indication: chest pain/ACS  Allergies  Allergen Reactions   Dapagliflozin Itching   Codeine Nausea And Vomiting    But has tolerated morphine    Patient Measurements: Height: 5' 0.98" (154.9 cm) Weight: 84.8 kg (186 lb 14.4 oz) IBW/kg (Calculated) : 47.76 Heparin Dosing Weight: 65 kg  Vital Signs: Temp: 98.3 F (36.8 C) (10/29 0343) Temp Source: Oral (10/29 0343) BP: 170/80 (10/29 0343) Pulse Rate: 78 (10/29 0343)  Labs: Recent Labs    10/05/22 0222 10/06/22 0110 10/06/22 1202 10/07/22 0108  HGB 9.8* 11.6*  --  10.8*  HCT 30.0* 34.7*  --  31.7*  PLT 283 231  --  297  HEPARINUNFRC 0.26* 0.25* 0.44 0.41  CREATININE 1.36* 1.15*  --  1.39*     Estimated Creatinine Clearance: 38.8 mL/min (A) (by C-G formula based on SCr of 1.39 mg/dL (H)).   Medical History: Past Medical History:  Diagnosis Date   Anginal pain ([place])    Coronary artery disease    angioplasty 9/13   Exertional dyspnea    "usually; today it was with nothing" (11/03/2012)   GERD (gastroesophageal reflux disease)    Hypercholesterolemia with hypertriglyceridemia    Hypertension    Kidney stones    "I've had them 5 times; lithotripsy 1st time; flushed out  all the others" (11/03/2012)   Obesity (BMI 30-39.9)    Pneumonia 2012   Sinus headache    "weekly" (11/03/2012)   Type II diabetes mellitus ([place]) 2007     Assessment: 66 year old female with unstable angina presented with chest pressure. Pharmacy consulted for management of heparin infusion for ACS. No anticoagulation noted PTA. Pharmacy consulted to resume heparin gtt now s/p cardiac catheterization on 10/25 and angiogram on 10/27.   Patient underwent amputation for gangrenous left fifth toe 10/28. Restarted heparin drip yesterday s/p surgery. Cardiothoracic plan tentative CABG 10/31. Heparin level 0.41 on drip rate 1300 units/hr therapeutic. Hgb 10.8 and plt 297. No s/sx bleeding  noted in chart.    Goal of Therapy:  Heparin level 0.3-0.7 units/ml Monitor platelets by anticoagulation protocol: Yes   Plan:  Continue heparin infusion at 1300 units/hr  Monitor daily heparin level and CBC Continue to monitor H&H     Gena Fray, PharmD PGY1 Pharmacy Resident   10/07/2022 8:08 AM

## 2022-10-07 NOTE — Progress Notes (Signed)
   10/07/22 1647  Mobility  Activity Ambulated with assistance in hallway  Level of Assistance Contact guard assist, steadying assist  Assistive Device Front wheel walker  Distance Ambulated (ft) 490 ft  Activity Response Tolerated well  Mobility Referral Yes  $Mobility charge 1 Mobility   Mobility Specialist Progress Note  Received pt in bed having no complaints and agreeable to mobility. Pt was asymptomatic throughout ambulation and returned to room w/o fault. Left in bed w/ call bell in reach and all needs met.   Amanda Mendez Mobility Specialist

## 2022-10-07 NOTE — Progress Notes (Signed)
Subjective: 1 Day Post-Op Procedure(s) (LRB): AMPUTATION LEFT FIFTH TOE (Left) DOS: 10/06/2022 Patient resting comfortably in bed.  Pain is tolerated with Tylenol.  Objective: Vital signs in last 24 hours: Temp:  [97.8 F (36.6 C)-98.4 F (36.9 C)] 98.4 F (36.9 C) (10/29 1130) Pulse Rate:  [72-93] 72 (10/29 1130) Resp:  [14-20] 17 (10/29 1130) BP: (117-170)/(60-80) 169/78 (10/29 1130) SpO2:  [92 %-98 %] 95 % (10/29 1130) Weight:  [84.8 kg] 84.8 kg (10/28 1303)  Sutures intact.  Skin incisions well coapted.  Recent Labs    10/05/22 0222 10/06/22 0110 10/07/22 0108  HGB 9.8* 11.6* 10.8*   Recent Labs    10/06/22 0110 10/07/22 0108  WBC 8.4 7.5  RBC 3.77* 3.40*  HCT 34.7* 31.7*  PLT 231 297   Recent Labs    10/06/22 0110 10/07/22 0108  NA 138 139  K 4.1 3.9  CL 107 107  CO2 19* 22  BUN 14 12  CREATININE 1.15* 1.39*  GLUCOSE 193* 236*  CALCIUM 9.2 9.1   No results for input(s): "LABPT", "INR" in the last 72 hours.   POD #1   Assessment/Plan: 1 Day Post-Op Procedure(s) (LRB): AMPUTATION LEFT FIFTH TOE (Left) DOS: 10/06/2022 -Dressings changed.  Leave clean dry and intact -WBAT in a surgical shoe or cam boot with the assistance of a walker -From a podiatry standpoint, the foot is stable and okay for discharge.  Patient has planned CABG Tuesday. If patient remains inpatient at the end of next week we will round for dressing change. Otherwise f/u in office within 1 week post discharge.  Edrick Kins 10/07/2022, 12:28 PM

## 2022-10-08 ENCOUNTER — Other Ambulatory Visit (HOSPITAL_COMMUNITY): Payer: Self-pay

## 2022-10-08 ENCOUNTER — Telehealth (HOSPITAL_COMMUNITY): Payer: Self-pay | Admitting: Pharmacy Technician

## 2022-10-08 ENCOUNTER — Encounter (HOSPITAL_COMMUNITY): Payer: Self-pay | Admitting: Podiatry

## 2022-10-08 DIAGNOSIS — R079 Chest pain, unspecified: Secondary | ICD-10-CM | POA: Diagnosis not present

## 2022-10-08 DIAGNOSIS — I2510 Atherosclerotic heart disease of native coronary artery without angina pectoris: Secondary | ICD-10-CM | POA: Diagnosis not present

## 2022-10-08 LAB — CBC
HCT: 32.5 % — ABNORMAL LOW (ref 36.0–46.0)
Hemoglobin: 11.1 g/dL — ABNORMAL LOW (ref 12.0–15.0)
MCH: 31.5 pg (ref 26.0–34.0)
MCHC: 34.2 g/dL (ref 30.0–36.0)
MCV: 92.3 fL (ref 80.0–100.0)
Platelets: 302 10*3/uL (ref 150–400)
RBC: 3.52 MIL/uL — ABNORMAL LOW (ref 3.87–5.11)
RDW: 12.9 % (ref 11.5–15.5)
WBC: 9.1 10*3/uL (ref 4.0–10.5)
nRBC: 0 % (ref 0.0–0.2)

## 2022-10-08 LAB — BASIC METABOLIC PANEL
Anion gap: 11 (ref 5–15)
BUN: 14 mg/dL (ref 8–23)
CO2: 24 mmol/L (ref 22–32)
Calcium: 9.2 mg/dL (ref 8.9–10.3)
Chloride: 104 mmol/L (ref 98–111)
Creatinine, Ser: 1.19 mg/dL — ABNORMAL HIGH (ref 0.44–1.00)
GFR, Estimated: 50 mL/min — ABNORMAL LOW (ref >60)
Glucose, Bld: 146 mg/dL — ABNORMAL HIGH (ref 70–99)
Potassium: 4.3 mmol/L (ref 3.5–5.1)
Sodium: 139 mmol/L (ref 135–145)

## 2022-10-08 LAB — GLUCOSE, CAPILLARY
Glucose-Capillary: 162 mg/dL — ABNORMAL HIGH (ref 70–99)
Glucose-Capillary: 226 mg/dL — ABNORMAL HIGH (ref 70–99)
Glucose-Capillary: 203 mg/dL — ABNORMAL HIGH (ref 70–99)
Glucose-Capillary: 254 mg/dL — ABNORMAL HIGH (ref 70–99)

## 2022-10-08 LAB — HEPARIN LEVEL (UNFRACTIONATED): Heparin Unfractionated: 0.51 IU/mL (ref 0.30–0.70)

## 2022-10-08 MED ORDER — SODIUM CHLORIDE 0.9% FLUSH
3.0000 mL | Freq: Two times a day (BID) | INTRAVENOUS | Status: DC
  Administered 2022-10-10 (×2): 3 mL via INTRAVENOUS

## 2022-10-08 MED FILL — Heparin Sod (Porcine)-NaCl IV Soln 1000 Unit/500ML-0.9%: INTRAVENOUS | Qty: 1000 | Status: AC

## 2022-10-08 NOTE — Progress Notes (Signed)
Mobility Specialist Progress Note:   10/08/22 0916  Mobility  Activity Ambulated with assistance in hallway  Level of Assistance Contact guard assist, steadying assist  Assistive Device Front wheel walker  Distance Ambulated (ft) 490 ft  Activity Response Tolerated well  $Mobility charge 1 Mobility   Pt received in bed willing to participate in mobility. No complaints of pain. Left in chair with call bell in reach and all needs met.   [facility] Surveyor, mining Chat only

## 2022-10-08 NOTE — Inpatient Diabetes Management (Signed)
Inpatient Diabetes Program Recommendations  AACE/ADA: New Consensus Statement on Inpatient Glycemic Control (2015)  Target Ranges:  Prepandial:   less than 140 mg/dL      Peak postprandial:   less than 180 mg/dL (1-2 hours)      Critically ill patients:  140 - 180 mg/dL   Lab Results  Component Value Date   GLUCAP 162 (H) 10/08/2022   HGBA1C 11.3 (H) 10/03/2022    Review of Glycemic Control  Latest Reference Range & Units 10/07/22 11:16 10/07/22 15:32 10/07/22 20:59 10/08/22 06:11  Glucose-Capillary 70 - 99 mg/dL 216 (H) 219 (H) 195 (H) 162 (H)  (H): Data is abnormally high Diabetes history: DM2 Outpatient Diabetes medications: Lantus 50 units QD, Amaryl 2 mg BID Current orders for Inpatient glycemic control: Semglee 16 units QD, Novolog 0-15 units TID and HS   Inpatient Diabetes Program Recommendations:     Consider increasing Semglee 20 units QD.  Thanks, Bronson Curb, MSN, RNC-OB Diabetes Coordinator [phone] (8a-5p)

## 2022-10-08 NOTE — Progress Notes (Signed)
2 Days Post-Op Procedure(s) (LRB): AMPUTATION LEFT FIFTH TOE (Left) Subjective:  Feels ok. No chest pain or SOB on IV heparin.  Had her left 5th toe amputated on Saturday. Dressing changed yesterday by Dr. Amalia Hailey and looked ok.  Objective: Vital signs in last 24 hours: Temp:  [98 F (36.7 C)-98.5 F (36.9 C)] 98 F (36.7 C) (10/30 0823) Pulse Rate:  [70-87] 82 (10/30 0823) Cardiac Rhythm: Normal sinus rhythm (10/30 0900) Resp:  [16-20] 20 (10/30 0823) BP: (147-166)/(70-79) 147/79 (10/30 0823) SpO2:  [95 %-99 %] 96 % (10/30 0823)  Hemodynamic parameters for last 24 hours:    Intake/Output from previous day: 10/29 0701 - 10/30 0700 In: 1789 [P.O.:1200; I.V.:589] Out: 2250 [Urine:2250] Intake/Output this shift: Total I/O In: 240 [P.O.:240] Out: 275 [Urine:275]  General appearance: alert and cooperative Heart: regular rate and rhythm, S1, S2 normal, no murmur, click, rub or gallop Lungs: clear to auscultation bilaterally Extremities: no edema, left foot dressing dry  Lab Results: Recent Labs    10/07/22 0108 10/08/22 0111  WBC 7.5 9.1  HGB 10.8* 11.1*  HCT 31.7* 32.5*  PLT 297 302   BMET:  Recent Labs    10/07/22 0108 10/08/22 0111  NA 139 139  K 3.9 4.3  CL 107 104  CO2 22 24  GLUCOSE 236* 146*  BUN 12 14  CREATININE 1.39* 1.19*  CALCIUM 9.1 9.2    PT/INR: No results for input(s): "LABPROT", "INR" in the last 72 hours. ABG    Component Value Date/Time   PHART 7.419 08/14/2012 1119   HCO3 29.7 (H) 08/14/2012 1119   TCO2 25 09/22/2012 1308   O2SAT 88.0 08/14/2012 1119   CBG (last 3)  Recent Labs    10/07/22 2059 10/08/22 0611 10/08/22 1155  GLUCAP 195* 162* 226*    Assessment/Plan:  Severe multivessel CAD. I had planned CABG tomorrow but due to emergency cases in schedule. I am going to have to move her surgery until Friday this week. I think that may be better for her anyway to allow her foot to heal some before she has lower extremity edema  which is inevitable after heart surgery. She is ok with waiting until Friday morning.    LOS: 6 days    Gaye Pollack 10/08/2022

## 2022-10-08 NOTE — Progress Notes (Signed)
PROGRESS NOTE   Amanda Mendez  FUX:[id number] DOB: October 09, 1956 DOA: 10/02/2022 PCP: Redmond School, MD  Brief Narrative:   66 year old white female known DM TY 2, HTN CAD PCI 08/15/2012 HFpEF EF 65-70% CKD 3 AA MGUS prior severe left lower extremity pain secondary to impinged nerve of lumbar spine. Seen by cardiology September 2023 Dr. Percival Spanish endorsing chest pain + SOB While undergoing cardiac perfusion study at [facility] 10/24 started having chest pain--PET = balanced ischemia MVD +/- microvascular disease and ST-T wave changes during study Work-up = slight elevation troponin 22, EKG no ST T wave changes-started on nitro gtt. after consultation with cardiology--- also found to have necrotic toe left little 10/26 cardiac cath-chronic total occlusion RCA proximal LAD first diagonal patent first obtuse marginal-cardiothoracic surgery consulted--CT PE study negative for PE, echocardiogram-70-75% with grade 1 DD 10/26 vascular surgery consulted-planning on doing angiogram 10/27 underwent angiogram, podiatry consulted 10/28 amputation left fifth toe by podiatrist   Hospital-Problem based course  Unstable angina with multivessel disease on cath 10/26 CAD and PCI in 08/15/2012--- mild chest pain noted 10/30 but controlled--- patient should take nitro if she is having pain and let nursing know-I have explained this to her Cardiology foll-heparin as per them, aspirin 81 mg, metoprolol 50 twice daily,  nitroglycerin drip -->now on sublingual, Crestor 40 Cardiothoracic plan tentative CABG 10/31  Dry gangrene left fifth toe-angio 10/27 good flow status post amputation 10/28 left fifth toe by podiatry Wound care as per podiatry--keep dressings, WBAT -f/u 1 week postdischarge do not remove dressing  1 episode of diarrhea overnight 10/28 Monitor trends, work-up if persistent--Imodium given x1 No further complaints  ATN secondary to ARB/contrast load superimposed on CKD 3 AA Decreased IVF 75  cc/h-->50 cc/H--Creatinine has improved Holding Hyzaar 100-25, on discharge use lower dose or separate agent  Elevated D-dimer CTA chest negative for PE depression  Hypokalemia --resolved  DM TY 2 >A1c is >11 CBGs 160-220 with sliding scale coverage Amaryl 2 mg 3 times daily on hold will resume after all procedures--could add thiglitazone if cost is an issue for SGLPT-1 agents -continue sliding scale for now,  - Continues higher dose 16 units Levemir   Depression  continue Cymbalta 30 daily    DVT prophylaxis: Continues on IV heparin therapeutic dosing Code Status: Full Family Communication: None present at the bedside Disposition:  Status is: Inpatient Remains inpatient appropriate because: Will need evaluation by cardiothoracic surgery and will need angiogram and potential little toe amputation on the left side   Consultants:  Cardiology Cardiothoracic surgery Vascular surgery  Procedures:  Cardiac cath 10/25 conclusion      1st Mrg lesion is 10% stenosed.   1.  Severe multivessel disease consisting of chronic total occlusion of right coronary artery (which has a high anterior takeoff), proximal LAD, and first diagonal disease. 2.  Patent first obtuse marginal stent. 3.  LVEDP of 26 mmHg.  Angiogram 10/27 OPERATIVE FINDINGS:  Terminal aorta and iliac arteries: Ulcerated plaque in infrarenal aorta No flow limiting stenosis   Left lower extremity: Common femoral artery: patent without hemodynamically significant stenosis.  Profunda femoris artery: patent without hemodynamically significant stenosis.   Superficial femoral artery: patent without hemodynamically significant stenosis.  Popliteal artery: patent without hemodynamically significant stenosis.  Anterior tibial artery: disease about the origin, but patent without hemodynamically significant stenosis.  Tibioperoneal trunk: disease about the origin, but patent without hemodynamically significant stenosis.   Peroneal artery: disease about the origin, but patent without hemodynamically significant stenosis.  Posterior  tibial artery: disease about the origin, but patent without hemodynamically significant stenosis.  Pedal circulation: fills via PT / peroneal  Antimicrobials: None currently   Subjective:  Overall well no distress No fever no chills Tolerating diet Tells me he had some mild chest pain +  headache this morning-continues on heparin I discussed with her to ask for nitro and report if severe chest pain   Objective: Vitals:   10/07/22 2055 10/07/22 2330 10/08/22 0505 10/08/22 0823  BP: (!) 147/77 (!) 166/74 (!) 159/70 (!) 147/79  Pulse: 87 72 70 82  Resp: '16 20 18 20  '$ Temp: 98 F (36.7 C) 98.5 F (36.9 C) 98.4 F (36.9 C) 98 F (36.7 C)  TempSrc: Oral Oral Oral Oral  SpO2: 97% 99% 95% 96%  Weight:      Height:        Intake/Output Summary (Last 24 hours) at 10/08/2022 0929 Last data filed at 10/08/2022 0800 Gross per 24 hour  Intake 1309.04 ml  Output 2525 ml  Net -1215.96 ml    Filed Weights   10/04/22 0610 10/05/22 0346 10/06/22 1303  Weight: 81.6 kg 84.8 kg 84.8 kg    Examination:  EOMI NCAT no focal deficit-thick neck-appears well Chest clear  Abdomen soft no rebound ROM is intact --did not check the lower extremity needs today has been reviewed by podiatry S1-S2 no murmur seems to be normal sinus rhythm No pretibial edema  Data Reviewed: personally reviewed   CBC    Component Value Date/Time   WBC 9.1 10/08/2022 0111   RBC 3.52 (L) 10/08/2022 0111   HGB 11.1 (L) 10/08/2022 0111   HCT 32.5 (L) 10/08/2022 0111   PLT 302 10/08/2022 0111   MCV 92.3 10/08/2022 0111   MCH 31.5 10/08/2022 0111   MCHC 34.2 10/08/2022 0111   RDW 12.9 10/08/2022 0111   LYMPHSABS 2.4 10/03/2022 0516   MONOABS 1.1 (H) 10/03/2022 0516   EOSABS 0.3 10/03/2022 0516   BASOSABS 0.1 10/03/2022 0516      Latest Ref Rng & Units 10/08/2022    1:11 AM 10/07/2022     1:08 AM 10/06/2022    1:10 AM  CMP  Glucose 70 - 99 mg/dL 146  236  193   BUN 8 - 23 mg/dL '14  12  14   '$ Creatinine 0.44 - 1.00 mg/dL 1.19  1.39  1.15   Sodium 135 - 145 mmol/L 139  139  138   Potassium 3.5 - 5.1 mmol/L 4.3  3.9  4.1   Chloride 98 - 111 mmol/L 104  107  107   CO2 22 - 32 mmol/L '24  22  19   '$ Calcium 8.9 - 10.3 mg/dL 9.2  9.1  9.2      Radiology Studies:  summarized as above  Scheduled Meds:  aspirin  81 mg Oral Daily   DULoxetine  30 mg Oral Daily   ezetimibe  10 mg Oral Daily   insulin aspart  0-15 Units Subcutaneous TID AC & HS   insulin glargine-yfgn  16 Units Subcutaneous Daily   metoprolol tartrate  50 mg Oral BID   pantoprazole  40 mg Oral Daily   rosuvastatin  40 mg Oral Daily   sodium chloride flush  3 mL Intravenous Q12H   Continuous Infusions:  sodium chloride Stopped (10/05/22 1703)   sodium chloride     sodium chloride     heparin 1,300 Units/hr (10/08/22 0507)     LOS: 6 days   Time  spent: 24  Nita Sells, MD [facility] To contact the attending provider between 7A-7P or the covering provider during after hours 7P-7A, please log into the web site [email or web address] and access using universal [place] password for that web site. If you do not have the password, please call the hospital operator.  10/08/2022, 9:29 AM

## 2022-10-08 NOTE — Progress Notes (Signed)
ANTICOAGULATION CONSULT NOTE   Pharmacy Consult for heparin Indication: chest pain/ACS  Allergies  Allergen Reactions   Dapagliflozin Itching   Codeine Nausea And Vomiting    But has tolerated morphine    Patient Measurements: Height: 5' 0.98" (154.9 cm) Weight: 84.8 kg (186 lb 14.4 oz) IBW/kg (Calculated) : 47.76 Heparin Dosing Weight: 65 kg  Vital Signs: Temp: 98.4 F (36.9 C) (10/30 0505) Temp Source: Oral (10/30 0505) BP: 159/70 (10/30 0505) Pulse Rate: 70 (10/30 0505)  Labs: Recent Labs    10/06/22 0110 10/06/22 1202 10/07/22 0108 10/08/22 0111  HGB 11.6*  --  10.8* 11.1*  HCT 34.7*  --  31.7* 32.5*  PLT 231  --  297 302  HEPARINUNFRC 0.25* 0.44 0.41 0.51  CREATININE 1.15*  --  1.39* 1.19*    Estimated Creatinine Clearance: 45.3 mL/min (A) (by C-G formula based on SCr of 1.19 mg/dL (H)).   Medical History: Past Medical History:  Diagnosis Date   Anginal pain ([place])    Coronary artery disease    angioplasty 9/13   Exertional dyspnea    "usually; today it was with nothing" (11/03/2012)   GERD (gastroesophageal reflux disease)    Hypercholesterolemia with hypertriglyceridemia    Hypertension    Kidney stones    "I've had them 5 times; lithotripsy 1st time; flushed out  all the others" (11/03/2012)   Obesity (BMI 30-39.9)    Pneumonia 2012   Sinus headache    "weekly" (11/03/2012)   Type II diabetes mellitus ([place]) 2007     Assessment: 66 year old female with unstable angina presented with chest pressure. Pharmacy consulted for management of heparin infusion for ACS. No anticoagulation noted PTA. Pharmacy consulted to resume heparin gtt now s/p cardiac catheterization on 10/25 and angiogram on 10/27.   Patient underwent amputation for gangrenous left fifth toe 10/28. Plan for CABG 10/31.   Heparin level 0.51 is therapeutic on 1300 units/hr. CBC stable    Goal of Therapy:  Heparin level 0.3-0.7 units/ml Monitor platelets by anticoagulation  protocol: Yes   Plan:  Continue heparin infusion at 1300 units/hr  Monitor daily heparin level and CBC Continue to monitor H&H    Benetta Spar, PharmD, BCPS, BCCP Clinical Pharmacist  Please check AMION for all [place] phone numbers After 10:00 PM, call [place] [phone]

## 2022-10-08 NOTE — TOC Progression Note (Addendum)
Transition of Care [facility]) - Progression Note    Patient Details  Name: Amanda Mendez MRN: [id number] Date of Birth: 1956-08-19  Transition of Care [facility]) CM/SW [place], [place] Phone Number: 10/08/2022, 3:49 PM  Clinical Narrative:     CSW spoke with patient by phone- SDOH assessment- patient states groceries is expensive, which makes it hard to have a lot of food at the end of the month. Patient states she is aware of resources in [facility] CSW provided [employer] in her zip code. She reports she is fine with her utilities. Patient was appreciative of CSW contact. Patient states no other concerns or questions at this time.   Clinical Social Worker will sign off for now as social work intervention is no longer needed. Please consult [place] if new need arises.    Thurmond Butts, LCSW Clinical Social Worker                                                        Social Determinants of Health (SDOH) Interventions    Readmission Risk Interventions     No data to display

## 2022-10-08 NOTE — TOC Benefit Eligibility Note (Signed)
Patient Teacher, English as a foreign language completed.    The patient is currently admitted and upon discharge could be taking Tradjenta 5 mg.  The current 30 day co-pay is $467.00 due to a $420.00 deductible. WIl be $47.00 once deductible is met.   The patient is currently admitted and upon discharge could be taking Januvia 50 mg.  The current 30 day co-pay is $467.00 due to a $420.00 deductible. WIl be $47.00 once deductible is met.   The patient is insured through [place], [place] Patient Advocate Specialist [place] Patient Advocate Team Direct Number: [phone]  Fax: [phone]

## 2022-10-08 NOTE — Progress Notes (Signed)
Progress Note  Patient Name: Amanda Mendez Date of Encounter: 10/08/2022  Primary Cardiologist: Minus Breeding, MD   Subjective   Patient seen and examined at her bedside. She offers no complaints.  Looking forward to her procedure tomorrow.  Inpatient Medications    Scheduled Meds:  aspirin  81 mg Oral Daily   DULoxetine  30 mg Oral Daily   ezetimibe  10 mg Oral Daily   insulin aspart  0-15 Units Subcutaneous TID AC & HS   insulin glargine-yfgn  16 Units Subcutaneous Daily   metoprolol tartrate  50 mg Oral BID   pantoprazole  40 mg Oral Daily   rosuvastatin  40 mg Oral Daily   sodium chloride flush  3 mL Intravenous Q12H   Continuous Infusions:  sodium chloride Stopped (10/05/22 1703)   sodium chloride     sodium chloride     heparin 1,300 Units/hr (10/08/22 0507)   PRN Meds: sodium chloride, sodium chloride, acetaminophen, acetaminophen, hydrALAZINE, hydrALAZINE, labetalol, loperamide, nitroGLYCERIN, ondansetron **OR** ondansetron (ZOFRAN) IV, ondansetron (ZOFRAN) IV, mouth rinse   Vital Signs    Vitals:   10/07/22 2055 10/07/22 2330 10/08/22 0505 10/08/22 0823  BP: (!) 147/77 (!) 166/74 (!) 159/70 (!) 147/79  Pulse: 87 72 70 82  Resp: '16 20 18 20  '$ Temp: 98 F (36.7 C) 98.5 F (36.9 C) 98.4 F (36.9 C) 98 F (36.7 C)  TempSrc: Oral Oral Oral Oral  SpO2: 97% 99% 95% 96%  Weight:      Height:        Intake/Output Summary (Last 24 hours) at 10/08/2022 0843 Last data filed at 10/08/2022 0800 Gross per 24 hour  Intake 1309.04 ml  Output 2525 ml  Net -1215.96 ml   Filed Weights   10/04/22 0610 10/05/22 0346 10/06/22 1303  Weight: 81.6 kg 84.8 kg 84.8 kg    Telemetry    Sinus rhythm- Personally Reviewed  ECG    None today - Personally Reviewed  Physical Exam    General: Comfortable, sitting up in a chair Head: Atraumatic, normal size  Eyes: PEERLA, EOMI  Neck: Supple, normal JVD Cardiac: Normal S1, S2; RRR; no murmurs, rubs, or  gallops Lungs: Clear to auscultation bilaterally Abd: Soft, nontender, no hepatomegaly  Ext: warm, no edema Musculoskeletal: No deformities, BUE and BLE strength normal and equal Skin: Warm and dry, no rashes   Neuro: Alert and oriented to person, place, time, and situation, CNII-XII grossly intact, no focal deficits  Psych: Normal mood and affect   Labs    Chemistry Recent Labs  Lab 10/02/22 1529 10/03/22 0516 10/04/22 0132 10/06/22 0110 10/07/22 0108 10/08/22 0111  NA 135 138   < > 138 139 139  K 4.0 3.2*   < > 4.1 3.9 4.3  CL 95* 100   < > 107 107 104  CO2 28 27   < > 19* 22 24  GLUCOSE 271* 156*   < > 193* 236* 146*  BUN 32* 39*   < > '14 12 14  '$ CREATININE 0.97 1.50*   < > 1.15* 1.39* 1.19*  CALCIUM 9.0 9.1   < > 9.2 9.1 9.2  PROT 7.6 6.6  --   --   --   --   ALBUMIN 3.8 3.3*  --   --   --   --   AST 43* 29  --   --   --   --   ALT 24 26  --   --   --   --  ALKPHOS 79 73  --   --   --   --   BILITOT 1.1 0.6  --   --   --   --   GFRNONAA >60 38*   < > 52* 42* 50*  ANIONGAP 12 11   < > '12 10 11   '$ < > = values in this interval not displayed.     Hematology Recent Labs  Lab 10/06/22 0110 10/07/22 0108 10/08/22 0111  WBC 8.4 7.5 9.1  RBC 3.77* 3.40* 3.52*  HGB 11.6* 10.8* 11.1*  HCT 34.7* 31.7* 32.5*  MCV 92.0 93.2 92.3  MCH 30.8 31.8 31.5  MCHC 33.4 34.1 34.2  RDW 12.9 13.0 12.9  PLT 231 297 302    Cardiac EnzymesNo results for input(s): "TROPONINI" in the last 168 hours. No results for input(s): "TROPIPOC" in the last 168 hours.   BNPNo results for input(s): "BNP", "PROBNP" in the last 168 hours.   DDimer  Recent Labs  Lab 10/02/22 1949  DDIMER 1.03*     Radiology    DG Foot Complete Left  Result Date: 10/06/2022 CLINICAL DATA:  Postoperative state EXAM: LEFT FOOT - COMPLETE 3+ VIEW COMPARISON:  None Available. FINDINGS: There is disarticulation of fifth toe. No fracture or dislocation is seen. Degenerative changes are noted with bony spurs in  the distal interphalangeal joint of middle toe. Plantar spur is seen in calcaneus. IMPRESSION: Disarticulation of left fifth toe. No recent fracture or dislocation is seen. There are no focal lytic lesions. Electronically Signed   By: Elmer Picker M.D.   On: 10/06/2022 17:06    Cardiac Studies   CATH:   Diagnostic Dominance: Right  Intervention     ECHO:      1. Left ventricular ejection fraction, by estimation, is 70 to 75%. The  left ventricle has hyperdynamic function. The left ventricle has no  regional wall motion abnormalities. There is mild concentric left  ventricular hypertrophy. Left ventricular  diastolic parameters are consistent with Grade I diastolic dysfunction  (impaired relaxation).   2. Right ventricular systolic function is normal. The right ventricular  size is normal.   3. The mitral valve is normal in structure. No evidence of mitral valve  regurgitation. No evidence of mitral stenosis.   4. The aortic valve is normal in structure. Aortic valve regurgitation is  not visualized. No aortic stenosis is present.   5. The inferior vena cava is normal in size with greater than 50%  respiratory variability, suggesting right atrial pressure of 3 mmHg.   Patient Profile     66 y.o. female with a hx of CAD s/p stenting to OM in 2013, hypertension, diabetes, hyperlipidemia and intermittent renal insufficiency.  Assessment & Plan    Coronary artery disease-plan for CABG tomorrow.  We will continue her current regimen which includes aspirin, heparin drip, Lipitor, Zetia and Lopressor.  Hypertension-blood pressure slightly elevated will monitor and adjust medication as appropriate.  Diabetes mellitus-this is being managed by the primary team.    For questions or updates, please contact [place] Please consult [email or web address] for contact info under Cardiology/STEMI.      SignedBerniece Salines, DO  10/08/2022, 8:43 AM

## 2022-10-08 NOTE — Telephone Encounter (Signed)
Pharmacy Patient Advocate Encounter  Insurance verification completed.    The patient is insured through [employer] Part D   The patient is currently admitted and ran test claims for the following: Januvia, Tradjenta.  Copays and coinsurance results were relayed to Inpatient clinical team.

## 2022-10-08 NOTE — Progress Notes (Addendum)
CARDIAC REHAB PHASE I     Pt sitting up in chair feeling well today. Walked one time with mobility and tolerated well. Questions and concerns regarding OHS teaching provided Friday reviewed. Will continue to follow.   [id number]  Vanessa Barbara, RN BSN 10/08/2022 10:21 AM

## 2022-10-09 DIAGNOSIS — I2510 Atherosclerotic heart disease of native coronary artery without angina pectoris: Secondary | ICD-10-CM | POA: Diagnosis not present

## 2022-10-09 DIAGNOSIS — R079 Chest pain, unspecified: Secondary | ICD-10-CM | POA: Diagnosis not present

## 2022-10-09 LAB — BASIC METABOLIC PANEL
Anion gap: 6 (ref 5–15)
BUN: 18 mg/dL (ref 8–23)
CO2: 25 mmol/L (ref 22–32)
Calcium: 9.3 mg/dL (ref 8.9–10.3)
Chloride: 105 mmol/L (ref 98–111)
Creatinine, Ser: 1.48 mg/dL — ABNORMAL HIGH (ref 0.44–1.00)
GFR, Estimated: 39 mL/min — ABNORMAL LOW (ref >60)
Glucose, Bld: 235 mg/dL — ABNORMAL HIGH (ref 70–99)
Potassium: 4.3 mmol/L (ref 3.5–5.1)
Sodium: 136 mmol/L (ref 135–145)

## 2022-10-09 LAB — GLUCOSE, CAPILLARY
Glucose-Capillary: 196 mg/dL — ABNORMAL HIGH (ref 70–99)
Glucose-Capillary: 199 mg/dL — ABNORMAL HIGH (ref 70–99)
Glucose-Capillary: 253 mg/dL — ABNORMAL HIGH (ref 70–99)
Glucose-Capillary: 355 mg/dL — ABNORMAL HIGH (ref 70–99)

## 2022-10-09 LAB — CBC
HCT: 32 % — ABNORMAL LOW (ref 36.0–46.0)
Hemoglobin: 10.6 g/dL — ABNORMAL LOW (ref 12.0–15.0)
MCH: 31.5 pg (ref 26.0–34.0)
MCHC: 33.1 g/dL (ref 30.0–36.0)
MCV: 95.2 fL (ref 80.0–100.0)
Platelets: 303 10*3/uL (ref 150–400)
RBC: 3.36 MIL/uL — ABNORMAL LOW (ref 3.87–5.11)
RDW: 13 % (ref 11.5–15.5)
WBC: 9.6 10*3/uL (ref 4.0–10.5)
nRBC: 0 % (ref 0.0–0.2)

## 2022-10-09 LAB — HEPARIN LEVEL (UNFRACTIONATED): Heparin Unfractionated: 0.41 IU/mL (ref 0.30–0.70)

## 2022-10-09 NOTE — Progress Notes (Signed)
Progress Note  Patient Name: Amanda Mendez Date of Encounter: 10/09/2022  Primary Cardiologist: Minus Breeding, MD   Subjective   Patient seen and examined at her bedside. She offers no complaints. She was walking with physical therapy.    Inpatient Medications    Scheduled Meds:  aspirin  81 mg Oral Daily   DULoxetine  30 mg Oral Daily   ezetimibe  10 mg Oral Daily   insulin aspart  0-15 Units Subcutaneous TID AC & HS   insulin glargine-yfgn  16 Units Subcutaneous Daily   metoprolol tartrate  50 mg Oral BID   pantoprazole  40 mg Oral Daily   rosuvastatin  40 mg Oral Daily   sodium chloride flush  3 mL Intravenous Q12H   Continuous Infusions:  sodium chloride 50 mL/hr at 10/09/22 0507   sodium chloride     sodium chloride     heparin 1,300 Units/hr (10/09/22 0638)   PRN Meds: sodium chloride, sodium chloride, acetaminophen, acetaminophen, hydrALAZINE, hydrALAZINE, labetalol, loperamide, nitroGLYCERIN, ondansetron **OR** ondansetron (ZOFRAN) IV, ondansetron (ZOFRAN) IV, mouth rinse   Vital Signs    Vitals:   10/08/22 2019 10/08/22 2315 10/09/22 0300 10/09/22 0739  BP: (!) 148/68 (!) 163/78 (!) 163/66 (!) 151/61  Pulse: 79 91 78 74  Resp: '17 20 18 20  '$ Temp: (!) 97.5 F (36.4 C) 97.6 F (36.4 C) 98.1 F (36.7 C) 98.3 F (36.8 C)  TempSrc: Oral Oral Oral Oral  SpO2: 98% 97% 96% 96%  Weight:      Height:        Intake/Output Summary (Last 24 hours) at 10/09/2022 0853 Last data filed at 10/09/2022 0553 Gross per 24 hour  Intake 1283.19 ml  Output 800 ml  Net 483.19 ml   Filed Weights   10/04/22 0610 10/05/22 0346 10/06/22 1303  Weight: 81.6 kg 84.8 kg 84.8 kg    Telemetry    Sinus rhythm- Personally Reviewed  ECG    None today - Personally Reviewed  Physical Exam    General: Comfortable, sitting up in a chair Head: Atraumatic, normal size  Eyes: PEERLA, EOMI  Neck: Supple, normal JVD Cardiac: Normal S1, S2; RRR; no murmurs, rubs, or  gallops Lungs: Clear to auscultation bilaterally Abd: Soft, nontender, no hepatomegaly  Ext: warm, no edema Musculoskeletal: No deformities, BUE and BLE strength normal and equal Skin: Warm and dry, no rashes   Neuro: Alert and oriented to person, place, time, and situation, CNII-XII grossly intact, no focal deficits  Psych: Normal mood and affect   Labs    Chemistry Recent Labs  Lab 10/02/22 1529 10/03/22 0516 10/04/22 0132 10/07/22 0108 10/08/22 0111 10/09/22 0139  NA 135 138   < > 139 139 136  K 4.0 3.2*   < > 3.9 4.3 4.3  CL 95* 100   < > 107 104 105  CO2 28 27   < > '22 24 25  '$ GLUCOSE 271* 156*   < > 236* 146* 235*  BUN 32* 39*   < > '12 14 18  '$ CREATININE 0.97 1.50*   < > 1.39* 1.19* 1.48*  CALCIUM 9.0 9.1   < > 9.1 9.2 9.3  PROT 7.6 6.6  --   --   --   --   ALBUMIN 3.8 3.3*  --   --   --   --   AST 43* 29  --   --   --   --   ALT 24 26  --   --   --   --  ALKPHOS 79 73  --   --   --   --   BILITOT 1.1 0.6  --   --   --   --   GFRNONAA >60 38*   < > 42* 50* 39*  ANIONGAP 12 11   < > '10 11 6   '$ < > = values in this interval not displayed.     Hematology Recent Labs  Lab 10/07/22 0108 10/08/22 0111 10/09/22 0139  WBC 7.5 9.1 9.6  RBC 3.40* 3.52* 3.36*  HGB 10.8* 11.1* 10.6*  HCT 31.7* 32.5* 32.0*  MCV 93.2 92.3 95.2  MCH 31.8 31.5 31.5  MCHC 34.1 34.2 33.1  RDW 13.0 12.9 13.0  PLT 297 302 303    Cardiac EnzymesNo results for input(s): "TROPONINI" in the last 168 hours. No results for input(s): "TROPIPOC" in the last 168 hours.   BNPNo results for input(s): "BNP", "PROBNP" in the last 168 hours.   DDimer  Recent Labs  Lab 10/02/22 1949  DDIMER 1.03*     Radiology    No results found.  Cardiac Studies   CATH:   Diagnostic Dominance: Right  Intervention     ECHO:      1. Left ventricular ejection fraction, by estimation, is 70 to 75%. The  left ventricle has hyperdynamic function. The left ventricle has no  regional wall motion  abnormalities. There is mild concentric left  ventricular hypertrophy. Left ventricular  diastolic parameters are consistent with Grade I diastolic dysfunction  (impaired relaxation).   2. Right ventricular systolic function is normal. The right ventricular  size is normal.   3. The mitral valve is normal in structure. No evidence of mitral valve  regurgitation. No evidence of mitral stenosis.   4. The aortic valve is normal in structure. Aortic valve regurgitation is  not visualized. No aortic stenosis is present.   5. The inferior vena cava is normal in size with greater than 50%  respiratory variability, suggesting right atrial pressure of 3 mmHg.   Patient Profile     66 y.o. female with a hx of CAD s/p stenting to OM in 2013, hypertension, diabetes, hyperlipidemia and intermittent renal insufficiency.  Assessment & Plan    Coronary artery disease-postponed till Friday.  We will continue her current regimen which includes aspirin, heparin drip, Lipitor, Zetia and Lopressor.  Hypertension-blood pressure slightly elevated will monitor and adjust medication as appropriate.  Diabetes mellitus-this is being managed by the primary team.  For questions or updates, please contact [place] Please consult [email or web address] for contact info under Cardiology/STEMI.      Signed, Berniece Salines, DO  10/09/2022, 8:53 AM

## 2022-10-09 NOTE — Inpatient Diabetes Management (Signed)
Inpatient Diabetes Program Recommendations  AACE/ADA: New Consensus Statement on Inpatient Glycemic Control  Target Ranges:  Prepandial:   less than 140 mg/dL      Peak postprandial:   less than 180 mg/dL (1-2 hours)      Critically ill patients:  140 - 180 mg/dL    Latest Reference Range & Units 10/08/22 06:11 10/08/22 11:55 10/08/22 16:46 10/08/22 21:14 10/09/22 06:04  Glucose-Capillary 70 - 99 mg/dL 162 (H) 226 (H) 203 (H) 254 (H) 196 (H)   Review of Glycemic Control  Diabetes history: DM2 Outpatient Diabetes medications: Lantus 50 units daily, Amaryl 2 mg BID Current orders for Inpatient glycemic control: Semglee 16 units daily, Novolog 0-15 units AC&HS  Inpatient Diabetes Program Recommendations:    Insulin: Please consider increasing Semglee to 18 units daily and consider ordering Novolog 4 units TID with meals for meal coverage if patient eats at least 50% of meals.  Thanks, Barnie Alderman, RN, MSN, [place] Diabetes Coordinator Inpatient Diabetes Program [phone] (Team Pager from 8am to [place])

## 2022-10-09 NOTE — Progress Notes (Signed)
CARDIAC REHAB PHASE I   PRE:  Rate/Rhythm: 70 Sr  BP:  Sitting: 150/76   SaO2: 100 RA  MODE:  Ambulation: 400 ft   POST:  Rate/Rhythm: 69 SR  BP:  Sitting: 149/75     SaO2: 99 RA  Pt received in recliner, agreeable to ambulation. Pt to standing independently. Pt ambulated in hallway with RW and contact guard, tolerated well. Pt c/o of SOB when returning to room at 6/10, resolved once seated back in recliner. No other s/s or pain. Pt returned to recliner, call bell in reach.  [id number]   Amanda Ewing, MS 10/09/2022 11:12 AM

## 2022-10-09 NOTE — Progress Notes (Signed)
3 Days Post-Op Procedure(s) (LRB): AMPUTATION LEFT FIFTH TOE (Left) Subjective:  Had some brief chest pain today while sitting. Very hypertensive at the time. She walked without chest pain today.  Objective: Vital signs in last 24 hours: Temp:  [97.5 F (36.4 C)-98.3 F (36.8 C)] 97.9 F (36.6 C) (10/31 1630) Pulse Rate:  [66-91] 74 (10/31 1630) Cardiac Rhythm: Normal sinus rhythm (10/31 0840) Resp:  [14-27] 21 (10/31 1630) BP: (148-182)/(59-78) 157/59 (10/31 1630) SpO2:  [96 %-99 %] 97 % (10/31 1630)  Hemodynamic parameters for last 24 hours:    Intake/Output from previous day: 10/30 0701 - 10/31 0700 In: 1523.2 [P.O.:240; I.V.:1283.2] Out: 1075 [Urine:1075] Intake/Output this shift: Total I/O In: 804.9 [P.O.:480; I.V.:324.9] Out: 300 [Urine:300]  General appearance: alert and cooperative Heart: regular rate and rhythm Lungs: clear to auscultation bilaterally  Lab Results: Recent Labs    10/08/22 0111 10/09/22 0139  WBC 9.1 9.6  HGB 11.1* 10.6*  HCT 32.5* 32.0*  PLT 302 303   BMET:  Recent Labs    10/08/22 0111 10/09/22 0139  NA 139 136  K 4.3 4.3  CL 104 105  CO2 24 25  GLUCOSE 146* 235*  BUN 14 18  CREATININE 1.19* 1.48*  CALCIUM 9.2 9.3    PT/INR: No results for input(s): "LABPROT", "INR" in the last 72 hours. ABG    Component Value Date/Time   PHART 7.419 08/14/2012 1119   HCO3 29.7 (H) 08/14/2012 1119   TCO2 25 09/22/2012 1308   O2SAT 88.0 08/14/2012 1119   CBG (last 3)  Recent Labs    10/08/22 2114 10/09/22 0604 10/09/22 1151  GLUCAP 254* 196* 199*    Assessment/Plan:  Severe multivessel CAD. Brief CP today while sitting but not while ambulating. Remains on heparin. I am planning CABG Friday am as long as she remains stable. Please let me know if she has further episodes of chest pain.    LOS: 7 days    Gaye Pollack 10/09/2022

## 2022-10-09 NOTE — Progress Notes (Signed)
Mobility Specialist Progress Note:   10/09/22 0904  Mobility  Activity Ambulated with assistance in hallway  Level of Assistance Standby assist, set-up cues, supervision of patient - no hands on  Assistive Device Front wheel walker  Distance Ambulated (ft) 400 ft  Activity Response Tolerated well  $Mobility charge 1 Mobility   Pt received in bed willing to participate in mobility. Complaints of RLE soreness. Left in chair with call bell in reach and all needs met.   Amanda Mendez Mobility Specialist Secure Chat only   

## 2022-10-09 NOTE — Progress Notes (Signed)
ANTICOAGULATION CONSULT NOTE   Pharmacy Consult for heparin Indication: chest pain/ACS  Allergies  Allergen Reactions   Dapagliflozin Itching   Codeine Nausea And Vomiting    But has tolerated morphine    Patient Measurements: Height: 5' 0.98" (154.9 cm) Weight: 84.8 kg (186 lb 14.4 oz) IBW/kg (Calculated) : 47.76 Heparin Dosing Weight: 65 kg  Vital Signs: Temp: 98.1 F (36.7 C) (10/31 0300) Temp Source: Oral (10/31 0300) BP: 163/66 (10/31 0300) Pulse Rate: 78 (10/31 0300)  Labs: Recent Labs    10/07/22 0108 10/08/22 0111 10/09/22 0139  HGB 10.8* 11.1* 10.6*  HCT 31.7* 32.5* 32.0*  PLT 297 302 303  HEPARINUNFRC 0.41 0.51 0.41  CREATININE 1.39* 1.19* 1.48*    Estimated Creatinine Clearance: 36.5 mL/min (A) (by C-G formula based on SCr of 1.48 mg/dL (H)).   Medical History: Past Medical History:  Diagnosis Date   Anginal pain ([place])    Coronary artery disease    angioplasty 9/13   Exertional dyspnea    "usually; today it was with nothing" (11/03/2012)   GERD (gastroesophageal reflux disease)    Hypercholesterolemia with hypertriglyceridemia    Hypertension    Kidney stones    "I've had them 5 times; lithotripsy 1st time; flushed out  all the others" (11/03/2012)   Obesity (BMI 30-39.9)    Pneumonia 2012   Sinus headache    "weekly" (11/03/2012)   Type II diabetes mellitus ([place]) 2007     Assessment: 66 year old female with unstable angina presented with chest pressure. Pharmacy consulted for management of heparin infusion for ACS. No anticoagulation noted PTA. Pharmacy consulted to resume heparin gtt now s/p cardiac catheterization on 10/25 and angiogram on 10/27.   Patient underwent amputation for gangrenous left fifth toe 10/28. Plan for CABG 11/3.  Heparin level 0.41 is therapeutic on 1300 units/hr. CBC stable    Goal of Therapy:  Heparin level 0.3-0.7 units/ml Monitor platelets by anticoagulation protocol: Yes   Plan:  Continue heparin  infusion at 1300 units/hr  Monitor daily heparin level, CBC Monitor for signs/symptoms of bleeding    Benetta Spar, PharmD, BCPS, BCCP Clinical Pharmacist  Please check AMION for all [place] phone numbers After 10:00 PM, call [place] [phone]

## 2022-10-09 NOTE — Progress Notes (Signed)
PROGRESS NOTE   Amanda Mendez  YQM:[id number] DOB: [birth year] DOA: 10/02/2022 PCP: Redmond School, MD  Brief Narrative:   66 year old white female known DM TY 2, HTN CAD PCI 08/15/2012 HFpEF EF 65-70% CKD 3 AA MGUS prior severe left lower extremity pain secondary to impinged nerve of lumbar spine. Seen by cardiology September 2023 Dr. Percival Spanish endorsing chest pain + SOB While undergoing cardiac perfusion study at [facility] 10/24 started having chest pain--PET = balanced ischemia MVD +/- microvascular disease and ST-T wave changes during study Work-up = slight elevation troponin 22, EKG no ST T wave changes-started on nitro gtt. after consultation with cardiology--- also found to have necrotic toe left little 10/26 cardiac cath-chronic total occlusion RCA proximal LAD first diagonal patent first obtuse marginal-cardiothoracic surgery consulted--CT PE study negative for PE, echocardiogram-70-75% with grade 1 DD 10/26 vascular surgery consulted-planning on doing angiogram 10/27 underwent angiogram, podiatry consulted 10/28 amputation left fifth toe by podiatrist   Hospital-Problem based course  Unstable angina with multivessel disease on cath 10/26 CAD and PCI in 08/15/2012--- mild chest pain noted 10/30 but controlled--- patient should take nitro if she is having pain and let nursing know-I have explained this to her Cardiology foll-heparin as per them, aspirin 81 mg, metoprolol 50 twice daily---pressure a little high--cardiology to adjust in next several days nitroglycerin drip -->now on sublingual, Crestor 40 Cardiothoracic plan tentative CABG, Date pushed back to possible 11/3  Dry gangrene left fifth toe-angio 10/27 good flow status post amputation 10/28 left fifth toe by podiatry per podiatry--keep dressings, WBAT -f/u 1 week postdischarge do not remove dressing  1 episode of diarrhea overnight 10/28 No further complaints  ATN secondary to ARB/contrast load superimposed on CKD 3  AA saline lock 10/31 Holding Hyzaar 100-25, on discharge use lower dose or separate agent  Elevated D-dimer CTA chest negative for PE depression  Hypokalemia --resolved  DM TY 2 >A1c is >11 CBGs 160-220 with sliding scale coverage Amaryl 2 mg 3 times daily on hold will resume after all procedures--could add thiglitazone if cost is an issue for SGLPT-1 agents -continue sliding scale for now,  - Continue higher dose 20 units Levemir   Depression  continue Cymbalta 30 daily    DVT prophylaxis: Continues on IV heparin therapeutic dosing Code Status: Full Family Communication: None present at the bedside Disposition:  Status is: Inpatient Remains inpatient appropriate because: CABG planned tentatively 11/3   Consultants:  Cardiology Cardiothoracic surgery Vascular surgery  Procedures:  Cardiac cath 10/25 conclusion      1st Mrg lesion is 10% stenosed.   1.  Severe multivessel disease consisting of chronic total occlusion of right coronary artery (which has a high anterior takeoff), proximal LAD, and first diagonal disease. 2.  Patent first obtuse marginal stent. 3.  LVEDP of 26 mmHg.  Angiogram 10/27 OPERATIVE FINDINGS:  Terminal aorta and iliac arteries: Ulcerated plaque in infrarenal aorta No flow limiting stenosis   Left lower extremity: Common femoral artery: patent without hemodynamically significant stenosis.  Profunda femoris artery: patent without hemodynamically significant stenosis.   Superficial femoral artery: patent without hemodynamically significant stenosis.  Popliteal artery: patent without hemodynamically significant stenosis.  Anterior tibial artery: disease about the origin, but patent without hemodynamically significant stenosis.  Tibioperoneal trunk: disease about the origin, but patent without hemodynamically significant stenosis.  Peroneal artery: disease about the origin, but patent without hemodynamically significant stenosis.  Posterior  tibial artery: disease about the origin, but patent without hemodynamically significant stenosis.  Pedal circulation: fills  via PT / peroneal  Antimicrobials: None currently   Subjective:  Well no new issue Eating and drinking fair TOe feels fair Intermittent chest discomfort--fair    Objective: Vitals:   10/08/22 2315 10/09/22 0300 10/09/22 0739 10/09/22 0912  BP: (!) 163/78 (!) 163/66 (!) 151/61 (!) 149/64  Pulse: 91 78 74 67  Resp: '20 18 20 14  '$ Temp: 97.6 F (36.4 C) 98.1 F (36.7 C) 98.3 F (36.8 C) 97.8 F (36.6 C)  TempSrc: Oral Oral Oral Oral  SpO2: 97% 96% 96% 99%  Weight:      Height:        Intake/Output Summary (Last 24 hours) at 10/09/2022 1114 Last data filed at 10/09/2022 0553 Gross per 24 hour  Intake 1283.19 ml  Output 800 ml  Net 483.19 ml    Filed Weights   10/04/22 0610 10/05/22 0346 10/06/22 1303  Weight: 81.6 kg 84.8 kg 84.8 kg    Examination:  EOMI NCAT no focal deficit-thick neck Chest clear without wheeze rales rhonchi Abdomen soft no rebound ROM is intact --LLE in heavy re-inforced dressing S1-S2 no murmur seems to be normal sinus rhythm No pretibial edema  Data Reviewed: personally reviewed   CBC    Component Value Date/Time   WBC 9.6 10/09/2022 0139   RBC 3.36 (L) 10/09/2022 0139   HGB 10.6 (L) 10/09/2022 0139   HCT 32.0 (L) 10/09/2022 0139   PLT 303 10/09/2022 0139   MCV 95.2 10/09/2022 0139   MCH 31.5 10/09/2022 0139   MCHC 33.1 10/09/2022 0139   RDW 13.0 10/09/2022 0139   LYMPHSABS 2.4 10/03/2022 0516   MONOABS 1.1 (H) 10/03/2022 0516   EOSABS 0.3 10/03/2022 0516   BASOSABS 0.1 10/03/2022 0516      Latest Ref Rng & Units 10/09/2022    1:39 AM 10/08/2022    1:11 AM 10/07/2022    1:08 AM  CMP  Glucose 70 - 99 mg/dL 235  146  236   BUN 8 - 23 mg/dL '18  14  12   '$ Creatinine 0.44 - 1.00 mg/dL 1.48  1.19  1.39   Sodium 135 - 145 mmol/L 136  139  139   Potassium 3.5 - 5.1 mmol/L 4.3  4.3  3.9   Chloride 98 - 111  mmol/L 105  104  107   CO2 22 - 32 mmol/L '25  24  22   '$ Calcium 8.9 - 10.3 mg/dL 9.3  9.2  9.1      Radiology Studies:  summarized as above  Scheduled Meds:  aspirin  81 mg Oral Daily   DULoxetine  30 mg Oral Daily   ezetimibe  10 mg Oral Daily   insulin aspart  0-15 Units Subcutaneous TID AC & HS   insulin glargine-yfgn  16 Units Subcutaneous Daily   metoprolol tartrate  50 mg Oral BID   pantoprazole  40 mg Oral Daily   rosuvastatin  40 mg Oral Daily   sodium chloride flush  3 mL Intravenous Q12H   Continuous Infusions:  sodium chloride 50 mL/hr at 10/09/22 0507   sodium chloride     sodium chloride     heparin 1,300 Units/hr (10/09/22 0638)     LOS: 7 days   Time spent: [place], MD [facility] To contact the attending provider between 7A-7P or the covering provider during after hours 7P-7A, please log into the web site [email or web address] and access using universal [place] password for that web site. If you do  not have the password, please call the hospital operator.  10/09/2022, 11:14 AM

## 2022-10-10 DIAGNOSIS — R9439 Abnormal result of other cardiovascular function study: Secondary | ICD-10-CM | POA: Diagnosis not present

## 2022-10-10 DIAGNOSIS — I200 Unstable angina: Secondary | ICD-10-CM | POA: Diagnosis not present

## 2022-10-10 DIAGNOSIS — I2510 Atherosclerotic heart disease of native coronary artery without angina pectoris: Secondary | ICD-10-CM | POA: Diagnosis not present

## 2022-10-10 DIAGNOSIS — R079 Chest pain, unspecified: Secondary | ICD-10-CM | POA: Diagnosis not present

## 2022-10-10 LAB — CBC
HCT: 31.9 % — ABNORMAL LOW (ref 36.0–46.0)
Hemoglobin: 11 g/dL — ABNORMAL LOW (ref 12.0–15.0)
MCH: 31.7 pg (ref 26.0–34.0)
MCHC: 34.5 g/dL (ref 30.0–36.0)
MCV: 91.9 fL (ref 80.0–100.0)
Platelets: 342 10*3/uL (ref 150–400)
RBC: 3.47 MIL/uL — ABNORMAL LOW (ref 3.87–5.11)
RDW: 13.2 % (ref 11.5–15.5)
WBC: 10.4 10*3/uL (ref 4.0–10.5)
nRBC: 0 % (ref 0.0–0.2)

## 2022-10-10 LAB — GLUCOSE, CAPILLARY
Glucose-Capillary: 180 mg/dL — ABNORMAL HIGH (ref 70–99)
Glucose-Capillary: 231 mg/dL — ABNORMAL HIGH (ref 70–99)
Glucose-Capillary: 151 mg/dL — ABNORMAL HIGH (ref 70–99)
Glucose-Capillary: 283 mg/dL — ABNORMAL HIGH (ref 70–99)

## 2022-10-10 LAB — BASIC METABOLIC PANEL
Anion gap: 10 (ref 5–15)
BUN: 23 mg/dL (ref 8–23)
CO2: 25 mmol/L (ref 22–32)
Calcium: 9.5 mg/dL (ref 8.9–10.3)
Chloride: 103 mmol/L (ref 98–111)
Creatinine, Ser: 1.51 mg/dL — ABNORMAL HIGH (ref 0.44–1.00)
GFR, Estimated: 38 mL/min — ABNORMAL LOW (ref >60)
Glucose, Bld: 207 mg/dL — ABNORMAL HIGH (ref 70–99)
Potassium: 4.1 mmol/L (ref 3.5–5.1)
Sodium: 138 mmol/L (ref 135–145)

## 2022-10-10 LAB — HEPARIN LEVEL (UNFRACTIONATED): Heparin Unfractionated: 0.5 IU/mL (ref 0.30–0.70)

## 2022-10-10 MED ORDER — INSULIN GLARGINE-YFGN 100 UNIT/ML ~~LOC~~ SOLN
20.0000 [IU] | Freq: Every day | SUBCUTANEOUS | Status: DC
  Administered 2022-10-10 – 2022-10-11 (×2): 20 [IU] via SUBCUTANEOUS
  Filled 2022-10-10 (×3): qty 0.2

## 2022-10-10 MED ORDER — CARVEDILOL 12.5 MG PO TABS
12.5000 mg | ORAL_TABLET | Freq: Two times a day (BID) | ORAL | Status: DC
  Administered 2022-10-10 – 2022-10-11 (×3): 12.5 mg via ORAL
  Filled 2022-10-10 (×3): qty 1

## 2022-10-10 MED ORDER — CARVEDILOL 6.25 MG PO TABS
6.2500 mg | ORAL_TABLET | Freq: Once | ORAL | Status: AC
  Administered 2022-10-10: 6.25 mg via ORAL
  Filled 2022-10-10: qty 1

## 2022-10-10 MED ORDER — INSULIN ASPART 100 UNIT/ML IJ SOLN
4.0000 [IU] | Freq: Three times a day (TID) | INTRAMUSCULAR | Status: DC
  Administered 2022-10-10 – 2022-10-11 (×4): 4 [IU] via SUBCUTANEOUS

## 2022-10-10 NOTE — Progress Notes (Addendum)
Mobility Specialist Progress Note:   10/10/22 1045  Mobility  Activity Ambulated with assistance in hallway  Level of Assistance Standby assist, set-up cues, supervision of patient - no hands on  Assistive Device Cane  Distance Ambulated (ft) 400 ft  Activity Response Tolerated well  $Mobility charge 1 Mobility   Pt received standing at chair wiling to participate in mobility. No complaints of pain. Left in chair with call bell in reach and all needs met.   Pre- Mobility:  77 HR During Mobility: 91 HR Post Mobility:  84 HR  Gregorio Worley Air traffic controller only

## 2022-10-10 NOTE — Progress Notes (Signed)
Mobility Specialist Progress Note:   10/10/22 0900  Mobility  Activity Ambulated with assistance in room;Ambulated with assistance to bathroom  Level of Assistance Standby assist, set-up cues, supervision of patient - no hands on  Assistive Device  (IV pole)  Distance Ambulated (ft) 20 ft  Activity Response Tolerated well  $Mobility charge 1 Mobility   Pt received in bed. Ambulated to BR and asking to ambulate in hallway later. Left in bathroom and was instructed to pull string when finished.  [facility] Surveyor, mining [employer] only

## 2022-10-10 NOTE — Plan of Care (Signed)
  Problem: Nutritional: Goal: Maintenance of adequate nutrition will improve Outcome: Progressing   

## 2022-10-10 NOTE — Progress Notes (Signed)
Progress Note  Patient Name: Amanda Mendez Date of Encounter: 10/10/2022  Primary Cardiologist: Minus Breeding, MD   Subjective   Patient seen and examined at her bedside. She offers no complaints.   Inpatient Medications    Scheduled Meds:  aspirin  81 mg Oral Daily   DULoxetine  30 mg Oral Daily   ezetimibe  10 mg Oral Daily   insulin aspart  0-15 Units Subcutaneous TID AC & HS   insulin aspart  4 Units Subcutaneous TID WC   insulin glargine-yfgn  20 Units Subcutaneous Daily   metoprolol tartrate  50 mg Oral BID   pantoprazole  40 mg Oral Daily   rosuvastatin  40 mg Oral Daily   sodium chloride flush  3 mL Intravenous Q12H   Continuous Infusions:  sodium chloride     sodium chloride     heparin 1,300 Units/hr (10/10/22 0700)   PRN Meds: sodium chloride, sodium chloride, acetaminophen, acetaminophen, hydrALAZINE, hydrALAZINE, labetalol, loperamide, nitroGLYCERIN, ondansetron **OR** ondansetron (ZOFRAN) IV, ondansetron (ZOFRAN) IV, mouth rinse   Vital Signs    Vitals:   10/10/22 0300 10/10/22 0324 10/10/22 0331 10/10/22 0815  BP:  (!) 151/62    Pulse:  76  84  Resp: (!) 21 20    Temp:  98.3 F (36.8 C)  98.2 F (36.8 C)  TempSrc:  Oral  Oral  SpO2:  96%  99%  Weight:   82 kg   Height:        Intake/Output Summary (Last 24 hours) at 10/10/2022 0829 Last data filed at 10/10/2022 0700 Gross per 24 hour  Intake 1169.92 ml  Output 700 ml  Net 469.92 ml   Filed Weights   10/05/22 0346 10/06/22 1303 10/10/22 0331  Weight: 84.8 kg 84.8 kg 82 kg    Telemetry    Sinus rhythm- Personally Reviewed  ECG    None today - Personally Reviewed  Physical Exam    General: Comfortable, sitting up in a chair Head: Atraumatic, normal size  Eyes: PEERLA, EOMI  Neck: Supple, normal JVD Cardiac: Normal S1, S2; RRR; no murmurs, rubs, or gallops Lungs: Clear to auscultation bilaterally Abd: Soft, nontender, no hepatomegaly  Ext: warm, no edema Musculoskeletal: No  deformities, BUE and BLE strength normal and equal Skin: Warm and dry, no rashes   Neuro: Alert and oriented to person, place, time, and situation, CNII-XII grossly intact, no focal deficits  Psych: Normal mood and affect   Labs    Chemistry Recent Labs  Lab 10/08/22 0111 10/09/22 0139 10/10/22 0151  NA 139 136 138  K 4.3 4.3 4.1  CL 104 105 103  CO2 '24 25 25  '$ GLUCOSE 146* 235* 207*  BUN '14 18 23  '$ CREATININE 1.19* 1.48* 1.51*  CALCIUM 9.2 9.3 9.5  GFRNONAA 50* 39* 38*  ANIONGAP '11 6 10     '$ Hematology Recent Labs  Lab 10/08/22 0111 10/09/22 0139 10/10/22 0151  WBC 9.1 9.6 10.4  RBC 3.52* 3.36* 3.47*  HGB 11.1* 10.6* 11.0*  HCT 32.5* 32.0* 31.9*  MCV 92.3 95.2 91.9  MCH 31.5 31.5 31.7  MCHC 34.2 33.1 34.5  RDW 12.9 13.0 13.2  PLT 302 303 342    Cardiac EnzymesNo results for input(s): "TROPONINI" in the last 168 hours. No results for input(s): "TROPIPOC" in the last 168 hours.   BNPNo results for input(s): "BNP", "PROBNP" in the last 168 hours.   DDimer  No results for input(s): "DDIMER" in the last 168 hours.  Radiology    No results found.  Cardiac Studies   CATH:   Diagnostic Dominance: Right  Intervention     ECHO:      1. Left ventricular ejection fraction, by estimation, is 70 to 75%. The  left ventricle has hyperdynamic function. The left ventricle has no  regional wall motion abnormalities. There is mild concentric left  ventricular hypertrophy. Left ventricular  diastolic parameters are consistent with Grade I diastolic dysfunction  (impaired relaxation).   2. Right ventricular systolic function is normal. The right ventricular  size is normal.   3. The mitral valve is normal in structure. No evidence of mitral valve  regurgitation. No evidence of mitral stenosis.   4. The aortic valve is normal in structure. Aortic valve regurgitation is  not visualized. No aortic stenosis is present.   5. The inferior vena cava is normal in size  with greater than 50%  respiratory variability, suggesting right atrial pressure of 3 mmHg.   Patient Profile     66 y.o. female with a hx of CAD s/p stenting to OM in 2013, hypertension, diabetes, hyperlipidemia and intermittent renal insufficiency.  Assessment & Plan    Coronary artery disease- planned for Friday.  We will continue her current regimen which includes aspirin, heparin drip, Lipitor, Zetia and Lopressor.  Hypertension-she is hypertensive will switch from metoprolol to coreg 12.5 mg BID.   Diabetes mellitus-this is being managed by the primary team.  For questions or updates, please contact [place] Please consult [email or web address] for contact info under Cardiology/STEMI.      Signed, Emmelina Mcloughlin, DO  10/10/2022, 8:29 AM

## 2022-10-10 NOTE — Progress Notes (Signed)
CARDIAC REHAB PHASE I     Pt feeling well today. Has walked several times and oob to chair most of the day. States I want to be strong for surgery. No questions or concerns regarding [facility] teaching provided. Will continue to follow.  [id number]  Vanessa Barbara, RN BSN 10/10/2022 2:30 PM

## 2022-10-10 NOTE — Progress Notes (Signed)
PROGRESS NOTE    Amanda Mendez  DXA:[id number] DOB: 1956/04/23 DOA: 10/02/2022 PCP: Redmond School, MD   Brief Narrative:  The patient is a 66 year old obese Caucasian female with past medical history of non insulin dependent diabetes mellitus type 2, GERD, coronary artery disease, hypertension, diastolic congestive heart failure (Echo 2015 EF 65-70% with G1DD), chronic kidney disease stage IIIa, anemia of chronic disease, secondary hyperparathyroidism, vitamin D deficiency, MGUS who presents to [facility] emergency department with complaints of chest pain.Chest pain 3-4 days.    Cards ordered a stress test as an outpatient that was performed earlier today ([place] [facility]) resulting in worsening chest pain and Troponin of 22.  No ECG change.  She was subsequently transferred to [facility] for further evaluation underwent a cardiac catheterization which found chronic total occlusion of the RCA proximal LAD as well as first diagonal and a patent obtuse marginal.  Cardiothoracic surgery was consulted and CTA PE protocol was negative and current plan is for her to undergo a CABG on Friday, 10/12/2002.  Given that she had gangrene of her left fifth toe vascular surgery was consulted and did an angiogram and she underwent angiogram podiatry was consulted and she subsequently underwent amputation of the left fifth toe by podiatry on 10/06/2022.  Assessment and Plan: * Chest pain and Unstable Angina with Multivessel Disease on Cath 10/26 CAD in Native Artery -Patient presenting with 4-day history of near constant chest discomfort with a preceding 1 year history of intermittent chest discomfort with exertion -While presentation is quite atypical and reproducible on palpation, this could very well be unstable angina considering the progression of symptoms -ECHO Done and showed EF of 70-75% and G1DD -Cardiac perfusion imaging earlier today abnormal with evidence of reversible ischemia -Continued  Nitroglycerin infusion initiated by APP which can be titrated for chest pain relief and is now on Sublingual NTG -Continuing Monitoring on Telemetry -Cath done and showed "1st Mrg lesion is 10% stenosed. Severe  multivessel disease consisting of chronic total occlusion of right coronary artery (which has a high anterior takeoff), proximal LAD, and first diagonal disease. Patent first obtuse marginal stent. LVEDP of 26 mmHg." -Continue Current Regimen with ASA 81 mg po Daily, Carvedilol 12.5 mg po BID, Ezetimibe 10 mg po Daily, Heparin gtt, NTG 0.4 mg sL q15mn PRN Chest Pain, and Rosuvastatin 40 mg po Daily  -Plan is for CABG on Friday 120/9/47 Chronic Diastolic HF (Heart [place] -No clinical evidence of cardiogenic volume overload -Strict input and output monitoring -Daily weights -Low-sodium diet -Continue to monitor for signs or symptoms of volume overload  Dry gangrene left fifth toe -Angio 10/27 good flow status post amputation 10/28 left fifth toe by podiatry Per podiatry--keep dressings, WBAT -f/u 1 week postdischarge do not remove dressing  AKI Chronic Kidney Disease Stage 3a -Patient's Cr has been ranging from 1.15-1.51 during hospitalization; -Last BUN/Cr was 23/1.51 -Strict intake and output monitoring -Holding Hyzaar 100-25 mg po Daily -Avoid further nephrotoxic medications, contrast dyes, hypotension and dehydration to ensure adequate renal perfusion and will need to renally adjust medications -Repeat CMP in a.m.  Diabetes Mellitus Type 2 with Stage 3a CKD without long-term current use of insulin -Continue with moderate sliding scale insulin AC and at bedtime along Accu-Cheks before every meal and nightly  -C/w Semglee 20 units subcu daily as well as insulin aspart 4 units SQ 3 times daily with meals given her elevated blood sugars -Continue monitor blood sugars per protocol -Last hemoglobin A1c reading  was -CBGs ranging from 180-355  Elevated D-Dimer -CTA Negative  for PE -C/w Heparin gtt  Depression and Anxiety -C/w Duloxetine 30 mg po Daily   Normocytic Anemia -Patient's Hgb/Hct went from 10.6/32.0 -> 11.0/31.9 -Check Anemia Panel in the AM -Continue to Monitor for S/Sx of Bleeding; No overt bleeding noted -Repeat CBC in the AM   Essential Hypertension -Continue with 12.5 mg of p.o. Carvedilol increased -IV hydralazine 10 mg every 6 as needed for systolic blood pressure greater than 062 or diastolic blood pressure in 110 and with labetalol 10 mg IV every 10 as needed for -Continue with nitroglycerin 0.4 mg sublingually every 5 minutes as needed for Chest Pain -Continue to monitor blood pressures per protocol -Last blood pressure reading was 151/62  GERD without esophagitis/GI prophylaxis -Continue with PPI with Pantoprazole 40 g p.o. daily  Obesity -Complicates overall prognosis and care -Estimated body mass index is 34.16 kg/m as calculated from the following:   Height as of this encounter: 5' 0.98" (1.549 m).   Weight as of this encounter: 82 kg.  -Weight Loss and Dietary Counseling given  DVT prophylaxis: SCDs Start: 10/06/22 1523 SCDs Start: 10/02/22 2101 Heparin gtt    Code Status: Full Code Family Communication: No family currently at bedside  Disposition Plan:  Level of care: Progressive Cardiac Status is: Inpatient Remains inpatient appropriate because: Plan is for Cath on Friday 10/12/22   Consultants:  Cardiology Cardiothoracic surgery Vascular surgery  Procedures:  Cardiac cath 10/25 Conclusion:       1st Mrg lesion is 10% stenosed.   1.  Severe multivessel disease consisting of chronic total occlusion of right coronary artery (which has a high anterior takeoff), proximal LAD, and first diagonal disease. 2.  Patent first obtuse marginal stent. 3.  LVEDP of 26 mmHg.   Angiogram 10/27 OPERATIVE FINDINGS:  Terminal aorta and iliac arteries: Ulcerated plaque in infrarenal aorta No flow limiting stenosis    Left lower extremity: Common femoral artery: patent without hemodynamically significant stenosis.  Profunda femoris artery: patent without hemodynamically significant stenosis.   Superficial femoral artery: patent without hemodynamically significant stenosis.  Popliteal artery: patent without hemodynamically significant stenosis.  Anterior tibial artery: disease about the origin, but patent without hemodynamically significant stenosis.  Tibioperoneal trunk: disease about the origin, but patent without hemodynamically significant stenosis.  Peroneal artery: disease about the origin, but patent without hemodynamically significant stenosis.  Posterior tibial artery: disease about the origin, but patent without hemodynamically significant stenosis.  Pedal circulation: fills via PT / peroneal  Antimicrobials:  Anti-infectives (From admission, onward)    None       Subjective: Seen and examined at bedside and she is still having some chest pain but not as bad.  Feels okay.  No nausea or vomiting.  Denies any other concerns or complaints at this time.  Objective: Vitals:   10/10/22 0300 10/10/22 0324 10/10/22 0331 10/10/22 0815  BP:  (!) 151/62    Pulse:  76  84  Resp: (!) 21 20    Temp:  98.3 F (36.8 C)  98.2 F (36.8 C)  TempSrc:  Oral  Oral  SpO2:  96%  99%  Weight:   82 kg   Height:        Intake/Output Summary (Last 24 hours) at 10/10/2022 0821 Last data filed at 10/10/2022 0700 Gross per 24 hour  Intake 1169.92 ml  Output 700 ml  Net 469.92 ml   Filed Weights   10/05/22 0346 10/06/22 1303 10/10/22  8250  Weight: 84.8 kg 84.8 kg 82 kg   Examination: Physical Exam:  Constitutional: WN/WD obese Caucasian female in NAD Respiratory: Diminished to auscultation bilaterally, no wheezing, rales, rhonchi or crackles. Normal respiratory effort and patient is not tachypenic. No accessory muscle use. Unlabored breathing  Cardiovascular: RRR, no murmurs / rubs / gallops. S1 and  S2 auscultated. Trace edema Abdomen: Soft, mildly-tender, Distended 2/2 to body habitus. Bowel sounds positive.  GU: Deferred. Musculoskeletal: No clubbing / cyanosis of digits/nails. No joint deformity upper and lower extremities. Right Foot is wrapped  Skin: No rashes, lesions, ulcers on a limited skin evaluation. No induration; Warm and dry.  Neurologic: CN 2-12 grossly intact with no focal deficits. Romberg sign and cerebellar reflexes not assessed.  Psychiatric: Normal judgment and insight. Alert and oriented x 3. Normal mood and appropriate affect.   Data Reviewed: I have personally reviewed following labs and imaging studies  CBC: Recent Labs  Lab 10/06/22 0110 10/07/22 0108 10/08/22 0111 10/09/22 0139 10/10/22 0151  WBC 8.4 7.5 9.1 9.6 10.4  HGB 11.6* 10.8* 11.1* 10.6* 11.0*  HCT 34.7* 31.7* 32.5* 32.0* 31.9*  MCV 92.0 93.2 92.3 95.2 91.9  PLT 231 297 302 303 037   Basic Metabolic Panel: Recent Labs  Lab 10/06/22 0110 10/07/22 0108 10/08/22 0111 10/09/22 0139 10/10/22 0151  NA 138 139 139 136 138  K 4.1 3.9 4.3 4.3 4.1  CL 107 107 104 105 103  CO2 19* '22 24 25 25  '$ GLUCOSE 193* 236* 146* 235* 207*  BUN '14 12 14 18 23  '$ CREATININE 1.15* 1.39* 1.19* 1.48* 1.51*  CALCIUM 9.2 9.1 9.2 9.3 9.5   GFR: Estimated Creatinine Clearance: 35.1 mL/min (A) (by C-G formula based on SCr of 1.51 mg/dL (H)). Liver Function Tests: No results for input(s): "AST", "ALT", "ALKPHOS", "BILITOT", "PROT", "ALBUMIN" in the last 168 hours. No results for input(s): "LIPASE", "AMYLASE" in the last 168 hours. No results for input(s): "AMMONIA" in the last 168 hours. Coagulation Profile: Recent Labs  Lab 10/03/22 1046  INR 1.0   Cardiac Enzymes: No results for input(s): "CKTOTAL", "CKMB", "CKMBINDEX", "TROPONINI" in the last 168 hours. BNP (last 3 results) No results for input(s): "PROBNP" in the last 8760 hours. HbA1C: No results for input(s): "HGBA1C" in the last 72  hours. CBG: Recent Labs  Lab 10/09/22 0604 10/09/22 1151 10/09/22 1700 10/09/22 2127 10/10/22 0625  GLUCAP 196* 199* 253* 355* 180*   Lipid Profile: No results for input(s): "CHOL", "HDL", "LDLCALC", "TRIG", "CHOLHDL", "LDLDIRECT" in the last 72 hours. Thyroid Function Tests: No results for input(s): "TSH", "T4TOTAL", "FREET4", "T3FREE", "THYROIDAB" in the last 72 hours. Anemia Panel: No results for input(s): "VITAMINB12", "FOLATE", "FERRITIN", "TIBC", "IRON", "RETICCTPCT" in the last 72 hours. Sepsis Labs: No results for input(s): "PROCALCITON", "LATICACIDVEN" in the last 168 hours.  Recent Results (from the past 240 hour(s))  Surgical pcr screen     Status: None   Collection Time: 10/04/22  8:49 PM   Specimen: Nasal Mucosa; Nasal Swab  Result Value Ref Range Status   MRSA, PCR NEGATIVE NEGATIVE Final   Staphylococcus aureus NEGATIVE NEGATIVE Final    Comment: (NOTE) The Xpert SA Assay (FDA approved for NASAL specimens in patients 88 years of age and older), is one component of a comprehensive surveillance program. It is not intended to diagnose infection nor to guide or monitor treatment. Performed at [facility], [place] [address], [place], [place] [address]     Radiology Studies: No results  found.  Scheduled Meds:  aspirin  81 mg Oral Daily   DULoxetine  30 mg Oral Daily   ezetimibe  10 mg Oral Daily   insulin aspart  0-15 Units Subcutaneous TID AC & HS   insulin glargine-yfgn  16 Units Subcutaneous Daily   metoprolol tartrate  50 mg Oral BID   pantoprazole  40 mg Oral Daily   rosuvastatin  40 mg Oral Daily   sodium chloride flush  3 mL Intravenous Q12H   Continuous Infusions:  sodium chloride     sodium chloride     heparin 1,300 Units/hr (10/10/22 0700)    LOS: 8 days   Raiford Noble, DO [facility] Available via Epic secure chat 7am-7pm After these hours, please refer to coverage provider listed on [email or web address] 10/10/2022, 8:21 AM

## 2022-10-11 DIAGNOSIS — I2089 Other forms of angina pectoris: Secondary | ICD-10-CM | POA: Diagnosis not present

## 2022-10-11 DIAGNOSIS — N1831 Chronic kidney disease, stage 3a: Secondary | ICD-10-CM | POA: Diagnosis not present

## 2022-10-11 DIAGNOSIS — I2510 Atherosclerotic heart disease of native coronary artery without angina pectoris: Secondary | ICD-10-CM

## 2022-10-11 DIAGNOSIS — E1122 Type 2 diabetes mellitus with diabetic chronic kidney disease: Secondary | ICD-10-CM | POA: Diagnosis not present

## 2022-10-11 DIAGNOSIS — R079 Chest pain, unspecified: Secondary | ICD-10-CM | POA: Diagnosis not present

## 2022-10-11 DIAGNOSIS — I5032 Chronic diastolic (congestive) heart failure: Secondary | ICD-10-CM | POA: Diagnosis not present

## 2022-10-11 DIAGNOSIS — R9439 Abnormal result of other cardiovascular function study: Secondary | ICD-10-CM | POA: Diagnosis not present

## 2022-10-11 DIAGNOSIS — I200 Unstable angina: Secondary | ICD-10-CM | POA: Diagnosis not present

## 2022-10-11 LAB — CBC
HCT: 33.5 % — ABNORMAL LOW (ref 36.0–46.0)
Hemoglobin: 11.1 g/dL — ABNORMAL LOW (ref 12.0–15.0)
MCH: 31.2 pg (ref 26.0–34.0)
MCHC: 33.1 g/dL (ref 30.0–36.0)
MCV: 94.1 fL (ref 80.0–100.0)
Platelets: 357 10*3/uL (ref 150–400)
RBC: 3.56 MIL/uL — ABNORMAL LOW (ref 3.87–5.11)
RDW: 13.2 % (ref 11.5–15.5)
WBC: 10.1 10*3/uL (ref 4.0–10.5)
nRBC: 0 % (ref 0.0–0.2)

## 2022-10-11 LAB — BASIC METABOLIC PANEL
Anion gap: 8 (ref 5–15)
BUN: 27 mg/dL — ABNORMAL HIGH (ref 8–23)
CO2: 24 mmol/L (ref 22–32)
Calcium: 9.4 mg/dL (ref 8.9–10.3)
Chloride: 105 mmol/L (ref 98–111)
Creatinine, Ser: 1.51 mg/dL — ABNORMAL HIGH (ref 0.44–1.00)
GFR, Estimated: 38 mL/min — ABNORMAL LOW (ref >60)
Glucose, Bld: 242 mg/dL — ABNORMAL HIGH (ref 70–99)
Potassium: 4.1 mmol/L (ref 3.5–5.1)
Sodium: 137 mmol/L (ref 135–145)

## 2022-10-11 LAB — GLUCOSE, CAPILLARY
Glucose-Capillary: 233 mg/dL — ABNORMAL HIGH (ref 70–99)
Glucose-Capillary: 246 mg/dL — ABNORMAL HIGH (ref 70–99)
Glucose-Capillary: 270 mg/dL — ABNORMAL HIGH (ref 70–99)
Glucose-Capillary: 212 mg/dL — ABNORMAL HIGH (ref 70–99)

## 2022-10-11 LAB — ABO/RH: ABO/RH(D): A POS

## 2022-10-11 LAB — HEPARIN LEVEL (UNFRACTIONATED): Heparin Unfractionated: 0.58 IU/mL (ref 0.30–0.70)

## 2022-10-11 MED ORDER — VANCOMYCIN HCL 1250 MG/250ML IV SOLN
1250.0000 mg | INTRAVENOUS | Status: AC
  Administered 2022-10-12: 1250 mg via INTRAVENOUS
  Filled 2022-10-11: qty 250

## 2022-10-11 MED ORDER — HEPARIN 30,000 UNITS/1000 ML (OHS) CELLSAVER SOLUTION
Status: DC
  Filled 2022-10-11: qty 1000

## 2022-10-11 MED ORDER — TRANEXAMIC ACID (OHS) BOLUS VIA INFUSION
15.0000 mg/kg | INTRAVENOUS | Status: AC
  Administered 2022-10-12: 1230 mg via INTRAVENOUS
  Filled 2022-10-11: qty 1230

## 2022-10-11 MED ORDER — INSULIN REGULAR(HUMAN) IN NACL 100-0.9 UT/100ML-% IV SOLN
INTRAVENOUS | Status: DC
  Filled 2022-10-11: qty 100

## 2022-10-11 MED ORDER — TRANEXAMIC ACID (OHS) PUMP PRIME SOLUTION
2.0000 mg/kg | INTRAVENOUS | Status: DC
  Filled 2022-10-11: qty 1.64

## 2022-10-11 MED ORDER — BISACODYL 5 MG PO TBEC
5.0000 mg | DELAYED_RELEASE_TABLET | Freq: Once | ORAL | Status: AC
  Administered 2022-10-11: 5 mg via ORAL

## 2022-10-11 MED ORDER — POTASSIUM CHLORIDE 2 MEQ/ML IV SOLN
80.0000 meq | INTRAVENOUS | Status: DC
  Filled 2022-10-11: qty 40

## 2022-10-11 MED ORDER — TRANEXAMIC ACID 1000 MG/10ML IV SOLN
1.5000 mg/kg/h | INTRAVENOUS | Status: DC
  Filled 2022-10-11: qty 25

## 2022-10-11 MED ORDER — MAGNESIUM SULFATE 50 % IJ SOLN
40.0000 meq | INTRAMUSCULAR | Status: DC
  Filled 2022-10-11: qty 9.85

## 2022-10-11 MED ORDER — TEMAZEPAM 7.5 MG PO CAPS: 15.0000 mg | ORAL_CAPSULE | Freq: Once | ORAL | Status: DC | PRN

## 2022-10-11 MED ORDER — METOPROLOL TARTRATE 12.5 MG HALF TABLET
12.5000 mg | ORAL_TABLET | Freq: Once | ORAL | Status: AC
  Administered 2022-10-12: 12.5 mg via ORAL
  Filled 2022-10-11: qty 1

## 2022-10-11 MED ORDER — PHENYLEPHRINE HCL-NACL 20-0.9 MG/250ML-% IV SOLN
30.0000 ug/min | INTRAVENOUS | Status: DC
  Filled 2022-10-11: qty 250

## 2022-10-11 MED ORDER — CHLORHEXIDINE GLUCONATE CLOTH 2 % EX PADS: 6.0000 | MEDICATED_PAD | Freq: Once | CUTANEOUS | Status: DC

## 2022-10-11 MED ORDER — DEXMEDETOMIDINE HCL IN NACL 400 MCG/100ML IV SOLN
0.1000 ug/kg/h | INTRAVENOUS | Status: DC
  Filled 2022-10-11: qty 100

## 2022-10-11 MED ORDER — DIAZEPAM 2 MG PO TABS
2.0000 mg | ORAL_TABLET | Freq: Once | ORAL | Status: AC
  Administered 2022-10-12: 2 mg via ORAL
  Filled 2022-10-11: qty 1

## 2022-10-11 MED ORDER — CHLORHEXIDINE GLUCONATE CLOTH 2 % EX PADS
6.0000 | MEDICATED_PAD | Freq: Once | CUTANEOUS | Status: AC
  Administered 2022-10-11: 6 via TOPICAL

## 2022-10-11 MED ORDER — MILRINONE LACTATE IN DEXTROSE 20-5 MG/100ML-% IV SOLN
0.3000 ug/kg/min | INTRAVENOUS | Status: DC
  Filled 2022-10-11: qty 100

## 2022-10-11 MED ORDER — CEFAZOLIN SODIUM-DEXTROSE 2-4 GM/100ML-% IV SOLN
2.0000 g | INTRAVENOUS | Status: DC
  Filled 2022-10-11: qty 100

## 2022-10-11 MED ORDER — PLASMA-LYTE A IV SOLN
INTRAVENOUS | Status: DC
  Filled 2022-10-11: qty 2.5

## 2022-10-11 MED ORDER — EPINEPHRINE HCL 5 MG/250ML IV SOLN IN NS
0.0000 ug/min | INTRAVENOUS | Status: DC
  Filled 2022-10-11: qty 250

## 2022-10-11 MED ORDER — CHLORHEXIDINE GLUCONATE 0.12 % MT SOLN
15.0000 mL | Freq: Once | OROMUCOSAL | Status: AC
  Administered 2022-10-12: 15 mL via OROMUCOSAL
  Filled 2022-10-11: qty 15

## 2022-10-11 MED ORDER — NOREPINEPHRINE 4 MG/250ML-% IV SOLN
0.0000 ug/min | INTRAVENOUS | Status: DC
  Filled 2022-10-11: qty 250

## 2022-10-11 MED ORDER — CEFAZOLIN SODIUM-DEXTROSE 2-4 GM/100ML-% IV SOLN
2.0000 g | INTRAVENOUS | Status: AC
  Administered 2022-10-12 (×2): 2 g via INTRAVENOUS
  Filled 2022-10-11: qty 100

## 2022-10-11 NOTE — Anesthesia Preprocedure Evaluation (Addendum)
Anesthesia Evaluation  Patient identified by MRN, date of birth, ID band Patient awake    Reviewed: Allergy & Precautions, NPO status , Patient's Chart, lab work & pertinent test results, reviewed documented beta blocker date and time   History of Anesthesia Complications Negative for: history of anesthetic complications  Airway Mallampati: III  TM Distance: >3 FB Neck ROM: Full    Dental  (+) Dental Advisory Given, Missing, Poor Dentition   Pulmonary former smoker   Pulmonary exam normal breath sounds clear to auscultation       Cardiovascular hypertension, Pt. on home beta blockers and Pt. on medications + angina with exertion and at rest + CAD and + Cardiac Stents (2013)  Normal cardiovascular exam Rhythm:Regular Rate:Normal  Cath 10/23:   1st Mrg lesion is 10% stenosed. 1.  Severe multivessel disease consisting of chronic total occlusion of right coronary artery (which has a high anterior takeoff), proximal LAD, and first diagonal disease. 2.  Patent first obtuse marginal stent. 3.  LVEDP of 26 mmHg.  Recommendation: Cardiothoracic surgery consultation; results discussed with Dr. Harl Bowie.  Nuc study: Small, reversible, moderate defect present in the basal inferolateral segment consistent with ischemia. EF drops with stress (68->63%). TID is present (1.19). Severely reduced coronary flow reserve (1.38). Findings are concerning for either balanced ischemia in the setting of 3-vessel CAD versus microvascular dysfunction. The patient reported chest pain on and off for the past 4 days occuring at rest. She was directed to the ER for work-up of possible unstable angina. .  LV perfusion is abnormal. There is evidence of ischemia. There is no evidence of infarction. Defect 1: There is a small defect with moderate reduction in uptake present in the basal inferolateral location(s) that is reversible. There is normal wall motion in the  defect area. Consistent with ischemia. Marland Kitchen  Rest left ventricular function is normal. Rest EF: 68 %. Stress left ventricular function is normal. Stress EF: 63 %. End diastolic cavity size is normal. .  Myocardial blood flow was computed to be 1.9m/g/min at rest and 1.549mg/min at stress. Global myocardial blood flow reserve was 1.38 and was highly abnormal. .  Coronary calcium was absent on the attenuation correction CT images. .  Findings are consistent with ischemia. The study is high risk.   ECHO 10/23 1. Left ventricular ejection fraction, by estimation, is 70 to 75%. The  left ventricle has hyperdynamic function. The left ventricle has no  regional wall motion abnormalities. There is mild concentric left  ventricular hypertrophy. Left ventricular  diastolic parameters are consistent with Grade I diastolic dysfunction  (impaired relaxation).  2. Right ventricular systolic function is normal. The right ventricular  size is normal.  3. The mitral valve is normal in structure. No evidence of mitral valve  regurgitation. No evidence of mitral stenosis.  4. The aortic valve is normal in structure. Aortic valve regurgitation is  not visualized. No aortic stenosis is present.  5. The inferior vena cava is normal in size with greater than 50%  respiratory variability, suggesting right atrial pressure of 3 mmHg.    Neuro/Psych  Headaches  negative psych ROS   GI/Hepatic Neg liver ROS,GERD  Medicated,,  Endo/Other  diabetes, Poorly Controlled, Type 2, Oral Hypoglycemic Agents, Insulin Dependent    Renal/GU CRFRenal disease  negative genitourinary   Musculoskeletal Gangrenous toe   Abdominal   Peds  Hematology negative hematology ROS (+)   Anesthesia Other Findings   Reproductive/Obstetrics  Anesthesia Physical Anesthesia Plan  ASA: 4  Anesthesia Plan: General   Post-op Pain Management: Precedex and Tylenol PO  (pre-op)*   Induction: Intravenous  PONV Risk Score and Plan: 4 or greater and Ondansetron  Airway Management Planned: Oral ETT  Additional Equipment: Arterial line, 3D TEE, PA Cath and Ultrasound Guidance Line Placement  Intra-op Plan:   Post-operative Plan: Post-operative intubation/ventilation  Informed Consent: I have reviewed the patients History and Physical, chart, labs and discussed the procedure including the risks, benefits and alternatives for the proposed anesthesia with the patient or authorized representative who has indicated his/her understanding and acceptance.     Dental advisory given  Plan Discussed with: Anesthesiologist  Anesthesia Plan Comments:         Anesthesia Quick Evaluation

## 2022-10-11 NOTE — Inpatient Diabetes Management (Signed)
Inpatient Diabetes Program Recommendations  AACE/ADA: New Consensus Statement on Inpatient Glycemic Control (2015)  Target Ranges:  Prepandial:   less than 140 mg/dL      Peak postprandial:   less than 180 mg/dL (1-2 hours)      Critically ill patients:  140 - 180 mg/dL   Lab Results  Component Value Date   GLUCAP 233 (H) 10/11/2022   HGBA1C 11.3 (H) 10/03/2022    Review of Glycemic Control  Latest Reference Range & Units 10/10/22 06:25 10/10/22 11:49 10/10/22 16:19 10/10/22 21:39 10/11/22 06:38  Glucose-Capillary 70 - 99 mg/dL 180 (H) 231 (H) 151 (H) 283 (H) 233 (H)   Diabetes history: DM 2 Outpatient Diabetes medications: Amaryl 2 mg TID, Lantus 50 units QD  Current orders for Inpatient glycemic control:  Semglee 20 units Novolog 0-15 units tid and hs Novolog 4 units tid meal coverage  Inpatient Diabetes Program Recommendations:    -  Increase Semglee to 25 units -  Increase Novolog meal coverage to 6 units tid if eating >50% of meals.  Thanks,  Tama Headings RN, MSN, BC-ADM Inpatient Diabetes Coordinator Team Pager [phone] (8a-5p)

## 2022-10-11 NOTE — Progress Notes (Signed)
PROGRESS NOTE    Amanda Mendez  AXE:[id number] DOB: May 03, 1956 DOA: 10/02/2022 PCP: Redmond School, MD   Brief Narrative:  The patient is a 66 year old obese Caucasian female with past medical history of non insulin dependent diabetes mellitus type 2, GERD, coronary artery disease, hypertension, diastolic congestive heart failure (Echo 2015 EF 65-70% with G1DD), chronic kidney disease stage IIIa, anemia of chronic disease, secondary hyperparathyroidism, vitamin D deficiency, MGUS who presents to [facility] emergency department with complaints of chest pain.Chest pain 3-4 days.    Cards ordered a stress test as an outpatient that was performed earlier today ([place] [facility]) resulting in worsening chest pain and Troponin of 22.  No ECG change.  She was subsequently transferred to [facility] for further evaluation underwent a cardiac catheterization which found chronic total occlusion of the RCA proximal LAD as well as first diagonal and a patent obtuse marginal.  Cardiothoracic surgery was consulted and CTA PE protocol was negative and current plan is for her to undergo a CABG on Friday, 10/12/2022.   Given that she had gangrene of her left fifth toe vascular surgery was consulted and did an angiogram and she underwent angiogram podiatry was consulted and she subsequently underwent amputation of the left fifth toe by Podiatry on 10/06/2022.  Assessment and Plan: Chest pain and Unstable Angina with Severe Multivessel Disease on Cath 10/26 CAD in Native Artery -Patient presenting with 4-day history of near constant chest discomfort with a preceding 1 year history of intermittent chest discomfort with exertion -While presentation is quite atypical and reproducible on palpation, this could very well be unstable angina considering the progression of symptoms -ECHO Done and showed EF of 70-75% and G1DD -Cardiac perfusion imaging earlier today abnormal with evidence of reversible  ischemia -Continued Nitroglycerin infusion initiated by APP which can be titrated for chest pain relief and is now on Sublingual NTG -Continuing Monitoring on Telemetry -Cath done and showed "1st Mrg lesion is 10% stenosed. Severe  multivessel disease consisting of chronic total occlusion of right coronary artery (which has a high anterior takeoff), proximal LAD, and first diagonal disease. Patent first obtuse marginal stent. LVEDP of 26 mmHg." -Continue Current Regimen with ASA 81 mg po Daily, Carvedilol 12.5 mg po BID, Ezetimibe 10 mg po Daily, Heparin gtt, NTG 0.4 mg sL q6mn PRN Chest Pain, and Rosuvastatin 40 mg po Daily  -Plan is for CABG on Friday 10/12/22 and Cardiothoracic Surgery has written Preop Orders    Chronic Diastolic HF (Heart Failure) (HExline -No clinical evidence of cardiogenic volume overload -Strict input and output monitoring; Patient is +5.224 Liters since admission  -Daily weights -Low-sodium diet -Continue to monitor for signs or symptoms of volume overload   Dry gangrene left fifth toe -Angio 10/27 good flow status post amputation 10/28 left fifth toe by podiatry Per podiatry--keep dressings, WBAT -f/u 1 week postdischarge do not remove dressing   AKI Chronic Kidney Disease Stage 3a -Patient's Cr has been ranging from 1.15-1.51 during hospitalization; -BUN/Cr has gone from 23/1.51 -> 27/1.51 -Strict intake and output monitoring -Holding Hyzaar 100-25 mg po Daily -Avoid further nephrotoxic medications, contrast dyes, hypotension and dehydration to ensure adequate renal perfusion and will need to renally adjust medications -Repeat CMP in a.m.   Diabetes Mellitus Type 2 with Stage 3a CKD without long-term current use of insulin -Continue with moderate sliding scale insulin AC and at bedtime along Accu-Cheks before every meal and nightly  -C/w Semglee 20 units subcu daily as well as insulin aspart  4 units SQ 3 times daily with meals given her elevated blood  sugars -Continue monitor blood sugars per protocol -Last hemoglobin A1c reading was -CBGs ranging from 151-283   Elevated D-Dimer -CTA Negative for PE -C/w Heparin gtt for now   Depression and Anxiety -C/w Duloxetine 30 mg po Daily    Normocytic Anemia -Patient's Hgb/Hct went from 10.6/32.0 -> 11.0/31.9 -> 11.1/33.5 -Check Anemia Panel in the AM -Continue to Monitor for S/Sx of Bleeding; No overt bleeding noted -Repeat CBC in the AM   Essential Hypertension -Continue with 12.5 mg of p.o. Carvedilol increased -IV hydralazine 10 mg every 6 as needed for systolic blood pressure greater than 010 or diastolic blood pressure in 110 and with labetalol 10 mg IV every 10 as needed for -Continue with nitroglycerin 0.4 mg sublingually every 5 minutes as needed for Chest Pain -Continue to monitor blood pressures per protocol -Last blood pressure reading was 144/45   GERD without esophagitis/GI prophylaxis -Continue with PPI with Pantoprazole 40 g p.o. daily   Obesity -Complicates overall prognosis and care -Estimated body mass index is 34.16 kg/m as calculated from the following:   Height as of this encounter: 5' 0.98" (1.549 m).   Weight as of this encounter: 82 kg.  -Weight Loss and Dietary Counseling given  DVT prophylaxis: SCDs Start: 10/06/22 1523 SCDs Start: 10/02/22 2101    Code Status: Full Code Family Communication: No family present at bedside  Disposition Plan:  Level of care: Progressive Cardiac Status is: Inpatient Remains inpatient appropriate because: Patient is going for CABG on Friday 10/12/22    Consultants:  Cardiology Cardiothoracic surgery Vascular Surgery Podiatry  Procedures:  Cardiac cath 10/25 Conclusion:       1st Mrg lesion is 10% stenosed.   1.  Severe multivessel disease consisting of chronic total occlusion of right coronary artery (which has a high anterior takeoff), proximal LAD, and first diagonal disease. 2.  Patent first obtuse  marginal stent. 3.  LVEDP of 26 mmHg.   Angiogram 10/27 OPERATIVE FINDINGS:  Terminal aorta and iliac arteries: Ulcerated plaque in infrarenal aorta No flow limiting stenosis   Left lower extremity: Common femoral artery: patent without hemodynamically significant stenosis.  Profunda femoris artery: patent without hemodynamically significant stenosis.   Superficial femoral artery: patent without hemodynamically significant stenosis.  Popliteal artery: patent without hemodynamically significant stenosis.  Anterior tibial artery: disease about the origin, but patent without hemodynamically significant stenosis.  Tibioperoneal trunk: disease about the origin, but patent without hemodynamically significant stenosis.  Peroneal artery: disease about the origin, but patent without hemodynamically significant stenosis.  Posterior tibial artery: disease about the origin, but patent without hemodynamically significant stenosis.  Pedal circulation: fills via PT / peroneal  Antimicrobials:  Anti-infectives (From admission, onward)    None       Subjective: Seen and examined at bedside and she was sleeping and awoken from sleep. States she is doing "alright" and had no CP or SOB. Had a bowel movement this AM. Ambulating without issues. No other concerns or complaints at this time.   Objective: Vitals:   10/10/22 2014 10/10/22 2305 10/11/22 0311 10/11/22 0826  BP: 139/65 (!) 146/61 (!) 145/63 (!) 144/45  Pulse: 91 84 82 83  Resp: '20 20 19 16  '$ Temp: 97.9 F (36.6 C) 98.5 F (36.9 C) 97.9 F (36.6 C) 97.9 F (36.6 C)  TempSrc: Oral Oral Oral Oral  SpO2: 94% 94% 93% 96%  Weight:      Height:  Intake/Output Summary (Last 24 hours) at 10/11/2022 0850 Last data filed at 10/11/2022 0700 Gross per 24 hour  Intake 457.12 ml  Output --  Net 457.12 ml   Filed Weights   10/05/22 0346 10/06/22 1303 10/10/22 0331  Weight: 84.8 kg 84.8 kg 82 kg   Examination: Physical  Exam:  Constitutional: WN/WD obese Caucasian female in NAD appears calm and comfortable  Respiratory: Diminished to auscultation bilaterally, no wheezing, rales, rhonchi or crackles. Normal respiratory effort and patient is not tachypenic. No accessory muscle use. Unlabored breathing  Cardiovascular: RRR, no murmurs / rubs / gallops. S1 and S2 auscultated.  Abdomen: Soft, non-tender, distended 2/2 body habitus. Bowel sounds positive.  GU: Deferred. Musculoskeletal: No clubbing / cyanosis of digits/nails. Left foot is wrapped Skin: No rashes, lesions, ulcers on a limited skin evaluation. No induration; Warm and dry.  Neurologic: CN 2-12 grossly intact with no focal deficits. Romberg sign and cerebellar reflexes not assessed.  Psychiatric: Normal judgment and insight. Alert and oriented x 3. Normal mood and appropriate affect.   Data Reviewed: I have personally reviewed following labs and imaging studies  CBC: Recent Labs  Lab 10/07/22 0108 10/08/22 0111 10/09/22 0139 10/10/22 0151 10/11/22 0126  WBC 7.5 9.1 9.6 10.4 10.1  HGB 10.8* 11.1* 10.6* 11.0* 11.1*  HCT 31.7* 32.5* 32.0* 31.9* 33.5*  MCV 93.2 92.3 95.2 91.9 94.1  PLT 297 302 303 342 124   Basic Metabolic Panel: Recent Labs  Lab 10/07/22 0108 10/08/22 0111 10/09/22 0139 10/10/22 0151 10/11/22 0126  NA 139 139 136 138 137  K 3.9 4.3 4.3 4.1 4.1  CL 107 104 105 103 105  CO2 '22 24 25 25 24  '$ GLUCOSE 236* 146* 235* 207* 242*  BUN '12 14 18 23 '$ 27*  CREATININE 1.39* 1.19* 1.48* 1.51* 1.51*  CALCIUM 9.1 9.2 9.3 9.5 9.4   GFR: Estimated Creatinine Clearance: 35.1 mL/min (A) (by C-G formula based on SCr of 1.51 mg/dL (H)). Liver Function Tests: No results for input(s): "AST", "ALT", "ALKPHOS", "BILITOT", "PROT", "ALBUMIN" in the last 168 hours. No results for input(s): "LIPASE", "AMYLASE" in the last 168 hours. No results for input(s): "AMMONIA" in the last 168 hours. Coagulation Profile: No results for input(s): "INR",  "PROTIME" in the last 168 hours. Cardiac Enzymes: No results for input(s): "CKTOTAL", "CKMB", "CKMBINDEX", "TROPONINI" in the last 168 hours. BNP (last 3 results) No results for input(s): "PROBNP" in the last 8760 hours. HbA1C: No results for input(s): "HGBA1C" in the last 72 hours. CBG: Recent Labs  Lab 10/10/22 0625 10/10/22 1149 10/10/22 1619 10/10/22 2139 10/11/22 0638  GLUCAP 180* 231* 151* 283* 233*   Lipid Profile: No results for input(s): "CHOL", "HDL", "LDLCALC", "TRIG", "CHOLHDL", "LDLDIRECT" in the last 72 hours. Thyroid Function Tests: No results for input(s): "TSH", "T4TOTAL", "FREET4", "T3FREE", "THYROIDAB" in the last 72 hours. Anemia Panel: No results for input(s): "VITAMINB12", "FOLATE", "FERRITIN", "TIBC", "IRON", "RETICCTPCT" in the last 72 hours. Sepsis Labs: No results for input(s): "PROCALCITON", "LATICACIDVEN" in the last 168 hours.  Recent Results (from the past 240 hour(s))  Surgical pcr screen     Status: None   Collection Time: 10/04/22  8:49 PM   Specimen: Nasal Mucosa; Nasal Swab  Result Value Ref Range Status   MRSA, PCR NEGATIVE NEGATIVE Final   Staphylococcus aureus NEGATIVE NEGATIVE Final    Comment: (NOTE) The Xpert SA Assay (FDA approved for NASAL specimens in patients 41 years of age and older), is one component of a comprehensive surveillance  program. It is not intended to diagnose infection nor to guide or monitor treatment. Performed at [facility], [place] [address] [place], [place] [address]     Radiology Studies: No results found.  Scheduled Meds:  aspirin  81 mg Oral Daily   carvedilol  12.5 mg Oral BID WC   DULoxetine  30 mg Oral Daily   ezetimibe  10 mg Oral Daily   insulin aspart  0-15 Units Subcutaneous TID AC & HS   insulin aspart  4 Units Subcutaneous TID WC   insulin glargine-yfgn  20 Units Subcutaneous Daily   pantoprazole  40 mg Oral Daily   rosuvastatin  40 mg Oral Daily   sodium chloride flush  3 mL  Intravenous Q12H   Continuous Infusions:  sodium chloride     sodium chloride     heparin 1,300 Units/hr (10/10/22 2332)    LOS: 9 days   Raiford Noble, DO [facility] Available via Epic secure chat 7am-7pm After these hours, please refer to coverage provider listed on [email or web address] 10/11/2022, 8:50 AM

## 2022-10-11 NOTE — Progress Notes (Signed)
ANTICOAGULATION CONSULT NOTE   Pharmacy Consult for heparin Indication: chest pain/ACS  Allergies  Allergen Reactions   Dapagliflozin Itching   Codeine Nausea And Vomiting    But has tolerated morphine    Patient Measurements: Height: 5' 0.98" (154.9 cm) Weight: 82 kg (180 lb 11.2 oz) IBW/kg (Calculated) : 47.76 Heparin Dosing Weight: 65 kg  Vital Signs: Temp: 97.9 F (36.6 C) (11/02 0826) Temp Source: Oral (11/02 0826) BP: 144/45 (11/02 0826) Pulse Rate: 83 (11/02 0826)  Labs: Recent Labs    10/09/22 0139 10/10/22 0151 10/11/22 0126  HGB 10.6* 11.0* 11.1*  HCT 32.0* 31.9* 33.5*  PLT 303 342 357  HEPARINUNFRC 0.41 0.50 0.58  CREATININE 1.48* 1.51* 1.51*     Estimated Creatinine Clearance: 35.1 mL/min (A) (by C-G formula based on SCr of 1.51 mg/dL (H)).   Medical History: Past Medical History:  Diagnosis Date   Anginal pain ([place])    Coronary artery disease    angioplasty 9/13   Exertional dyspnea    "usually; today it was with nothing" (11/03/2012)   GERD (gastroesophageal reflux disease)    Hypercholesterolemia with hypertriglyceridemia    Hypertension    Kidney stones    "I've had them 5 times; lithotripsy 1st time; flushed out  all the others" (11/03/2012)   Obesity (BMI 30-39.9)    Pneumonia 2012   Sinus headache    "weekly" (11/03/2012)   Type II diabetes mellitus ([place]) 2007     Assessment: 66 year old female with unstable angina presented with chest pressure. Pharmacy consulted for management of heparin infusion for ACS. No anticoagulation noted PTA. Pharmacy consulted to resume heparin gtt now s/p cardiac catheterization on 10/25 and angiogram on 10/27.   Patient underwent amputation for gangrenous left fifth toe 10/28. Plan for CABG 11/3.  Heparin level 0.58 is therapeutic on 1300 units/hr. CBC stable    Goal of Therapy:  Heparin level 0.3-0.7 units/ml Monitor platelets by anticoagulation protocol: Yes   Plan:  Continue heparin  infusion at 1300 units/hr  Monitor daily heparin level, CBC CABG in am  Hildred Laser, PharmD Clinical Pharmacist **Pharmacist phone directory can now be found on [email or web address] ([email or web address]).  Listed under Campus.

## 2022-10-11 NOTE — H&P (View-Only) (Signed)
5 Days Post-Op Procedure(s) (LRB): AMPUTATION LEFT FIFTH TOE (Left) Subjective:  No complaints. Has been ambulating without chest pain.  Objective: Vital signs in last 24 hours: Temp:  [97.9 F (36.6 C)-98.5 F (36.9 C)] 97.9 F (36.6 C) (11/02 0311) Pulse Rate:  [82-91] 82 (11/02 0311) Cardiac Rhythm: Normal sinus rhythm (11/01 2100) Resp:  [19-20] 19 (11/02 0311) BP: (139-163)/(61-69) 145/63 (11/02 0311) SpO2:  [93 %-94 %] 93 % (11/02 0311)  Hemodynamic parameters for last 24 hours:    Intake/Output from previous day: 11/01 0701 - 11/02 0700 In: 217.1 [I.V.:217.1] Out: -  Intake/Output this shift: No intake/output data recorded.    Lab Results: Recent Labs    10/10/22 0151 10/11/22 0126  WBC 10.4 10.1  HGB 11.0* 11.1*  HCT 31.9* 33.5*  PLT 342 357   BMET:  Recent Labs    10/10/22 0151 10/11/22 0126  NA 138 137  K 4.1 4.1  CL 103 105  CO2 25 24  GLUCOSE 207* 242*  BUN 23 27*  CREATININE 1.51* 1.51*  CALCIUM 9.5 9.4    PT/INR: No results for input(s): "LABPROT", "INR" in the last 72 hours. ABG    Component Value Date/Time   PHART 7.419 08/14/2012 1119   HCO3 29.7 (H) 08/14/2012 1119   TCO2 25 09/22/2012 1308   O2SAT 88.0 08/14/2012 1119   CBG (last 3)  Recent Labs    10/10/22 1619 10/10/22 2139 10/11/22 0638  GLUCAP 151* 283* 233*    Assessment/Plan:  Severe multi-vessel CAD. Plan CABG in am. She has no further questions. Preop orders written.   LOS: 9 days    Gaye Pollack 10/11/2022

## 2022-10-11 NOTE — Progress Notes (Signed)
Progress Note  Patient Name: Amanda Mendez Date of Encounter: 10/11/2022  Primary Cardiologist: Minus Breeding, MD   Subjective   Patient seen and examined at her bedside. She offers no complaints.   Inpatient Medications    Scheduled Meds:  aspirin  81 mg Oral Daily   carvedilol  12.5 mg Oral BID WC   DULoxetine  30 mg Oral Daily   ezetimibe  10 mg Oral Daily   insulin aspart  0-15 Units Subcutaneous TID AC & HS   insulin aspart  4 Units Subcutaneous TID WC   insulin glargine-yfgn  20 Units Subcutaneous Daily   pantoprazole  40 mg Oral Daily   rosuvastatin  40 mg Oral Daily   sodium chloride flush  3 mL Intravenous Q12H   Continuous Infusions:  sodium chloride     sodium chloride     heparin 1,300 Units/hr (10/10/22 2332)   PRN Meds: sodium chloride, sodium chloride, acetaminophen, acetaminophen, hydrALAZINE, hydrALAZINE, labetalol, loperamide, nitroGLYCERIN, ondansetron **OR** ondansetron (ZOFRAN) IV, ondansetron (ZOFRAN) IV, mouth rinse   Vital Signs    Vitals:   10/10/22 2014 10/10/22 2305 10/11/22 0311 10/11/22 0826  BP: 139/65 (!) 146/61 (!) 145/63 (!) 144/45  Pulse: 91 84 82 83  Resp: '20 20 19 16  '$ Temp: 97.9 F (36.6 C) 98.5 F (36.9 C) 97.9 F (36.6 C) 97.9 F (36.6 C)  TempSrc: Oral Oral Oral Oral  SpO2: 94% 94% 93% 96%  Weight:      Height:        Intake/Output Summary (Last 24 hours) at 10/11/2022 0828 Last data filed at 10/10/2022 2342 Gross per 24 hour  Intake 217.12 ml  Output --  Net 217.12 ml   Filed Weights   10/05/22 0346 10/06/22 1303 10/10/22 0331  Weight: 84.8 kg 84.8 kg 82 kg    Telemetry    Sinus rhythm- Personally Reviewed  ECG    None today - Personally Reviewed  Physical Exam    General: Comfortable, sitting up in a chair Head: Atraumatic, normal size  Eyes: PEERLA, EOMI  Neck: Supple, normal JVD Cardiac: Normal S1, S2; RRR; no murmurs, rubs, or gallops Lungs: Clear to auscultation bilaterally Abd: Soft,  nontender, no hepatomegaly  Ext: warm, no edema Musculoskeletal: No deformities, BUE and BLE strength normal and equal Skin: Warm and dry, no rashes   Neuro: Alert and oriented to person, place, time, and situation, CNII-XII grossly intact, no focal deficits  Psych: Normal mood and affect   Labs    Chemistry Recent Labs  Lab 10/09/22 0139 10/10/22 0151 10/11/22 0126  NA 136 138 137  K 4.3 4.1 4.1  CL 105 103 105  CO2 '25 25 24  '$ GLUCOSE 235* 207* 242*  BUN 18 23 27*  CREATININE 1.48* 1.51* 1.51*  CALCIUM 9.3 9.5 9.4  GFRNONAA 39* 38* 38*  ANIONGAP '6 10 8     '$ Hematology Recent Labs  Lab 10/09/22 0139 10/10/22 0151 10/11/22 0126  WBC 9.6 10.4 10.1  RBC 3.36* 3.47* 3.56*  HGB 10.6* 11.0* 11.1*  HCT 32.0* 31.9* 33.5*  MCV 95.2 91.9 94.1  MCH 31.5 31.7 31.2  MCHC 33.1 34.5 33.1  RDW 13.0 13.2 13.2  PLT 303 342 357    Cardiac EnzymesNo results for input(s): "TROPONINI" in the last 168 hours. No results for input(s): "TROPIPOC" in the last 168 hours.   BNPNo results for input(s): "BNP", "PROBNP" in the last 168 hours.   DDimer  No results for input(s): "DDIMER" in the  last 168 hours.    Radiology    No results found.  Cardiac Studies   CATH:   Diagnostic Dominance: Right  Intervention     ECHO:      1. Left ventricular ejection fraction, by estimation, is 70 to 75%. The  left ventricle has hyperdynamic function. The left ventricle has no  regional wall motion abnormalities. There is mild concentric left  ventricular hypertrophy. Left ventricular  diastolic parameters are consistent with Grade I diastolic dysfunction  (impaired relaxation).   2. Right ventricular systolic function is normal. The right ventricular  size is normal.   3. The mitral valve is normal in structure. No evidence of mitral valve  regurgitation. No evidence of mitral stenosis.   4. The aortic valve is normal in structure. Aortic valve regurgitation is  not visualized. No  aortic stenosis is present.   5. The inferior vena cava is normal in size with greater than 50%  respiratory variability, suggesting right atrial pressure of 3 mmHg.   Patient Profile     66 y.o. female with a hx of CAD s/p stenting to OM in 2013, hypertension, diabetes, hyperlipidemia and intermittent renal insufficiency.  Assessment & Plan    Coronary artery disease- CABG planned for tomorrow.  We will continue her current regimen which includes aspirin, heparin drip, Lipitor, Zetia and Lopressor.  Hypertension-blood pressure improved, will keep her on the coreg 12.'5mg'$  Bid  Diabetes mellitus-this is being managed by the primary team.  For questions or updates, please contact [place] Please consult [email or web address] for contact info under Cardiology/STEMI.      Signed, Berniece Salines, DO  10/11/2022, 8:28 AM

## 2022-10-11 NOTE — Progress Notes (Signed)
5 Days Post-Op Procedure(s) (LRB): AMPUTATION LEFT FIFTH TOE (Left) Subjective:  No complaints. Has been ambulating without chest pain.  Objective: Vital signs in last 24 hours: Temp:  [97.9 F (36.6 C)-98.5 F (36.9 C)] 97.9 F (36.6 C) (11/02 0311) Pulse Rate:  [82-91] 82 (11/02 0311) Cardiac Rhythm: Normal sinus rhythm (11/01 2100) Resp:  [19-20] 19 (11/02 0311) BP: (139-163)/(61-69) 145/63 (11/02 0311) SpO2:  [93 %-94 %] 93 % (11/02 0311)  Hemodynamic parameters for last 24 hours:    Intake/Output from previous day: 11/01 0701 - 11/02 0700 In: 217.1 [I.V.:217.1] Out: -  Intake/Output this shift: No intake/output data recorded.    Lab Results: Recent Labs    10/10/22 0151 10/11/22 0126  WBC 10.4 10.1  HGB 11.0* 11.1*  HCT 31.9* 33.5*  PLT 342 357   BMET:  Recent Labs    10/10/22 0151 10/11/22 0126  NA 138 137  K 4.1 4.1  CL 103 105  CO2 25 24  GLUCOSE 207* 242*  BUN 23 27*  CREATININE 1.51* 1.51*  CALCIUM 9.5 9.4    PT/INR: No results for input(s): "LABPROT", "INR" in the last 72 hours. ABG    Component Value Date/Time   PHART 7.419 08/14/2012 1119   HCO3 29.7 (H) 08/14/2012 1119   TCO2 25 09/22/2012 1308   O2SAT 88.0 08/14/2012 1119   CBG (last 3)  Recent Labs    10/10/22 1619 10/10/22 2139 10/11/22 0638  GLUCAP 151* 283* 233*    Assessment/Plan:  Severe multi-vessel CAD. Plan CABG in am. She has no further questions. Preop orders written.   LOS: 9 days    Gaye Pollack 10/11/2022

## 2022-10-11 NOTE — Progress Notes (Signed)
CARDIAC REHAB PHASE I      Pt resting in bed, feeling a little tired this morning. Would like to rest now and walk later today. Eager to have surgery tomorrow and begin recovery process. Will continue to follow.    [id number]  Vanessa Barbara, RN BSN 10/11/2022 11:13 AM

## 2022-10-12 ENCOUNTER — Other Ambulatory Visit: Payer: Self-pay

## 2022-10-12 ENCOUNTER — Inpatient Hospital Stay (HOSPITAL_COMMUNITY): Payer: Medicare Other

## 2022-10-12 ENCOUNTER — Inpatient Hospital Stay (HOSPITAL_COMMUNITY): Payer: Medicare Other | Admitting: Anesthesiology

## 2022-10-12 ENCOUNTER — Inpatient Hospital Stay (HOSPITAL_COMMUNITY)
Admission: EM | Disposition: A | Discharge status: Home / Self Care | Payer: Self-pay | Source: Home / Self Care | Attending: Family Medicine

## 2022-10-12 DIAGNOSIS — E1122 Type 2 diabetes mellitus with diabetic chronic kidney disease: Secondary | ICD-10-CM | POA: Diagnosis not present

## 2022-10-12 DIAGNOSIS — I25119 Atherosclerotic heart disease of native coronary artery with unspecified angina pectoris: Secondary | ICD-10-CM

## 2022-10-12 DIAGNOSIS — I129 Hypertensive chronic kidney disease with stage 1 through stage 4 chronic kidney disease, or unspecified chronic kidney disease: Secondary | ICD-10-CM

## 2022-10-12 DIAGNOSIS — N189 Chronic kidney disease, unspecified: Secondary | ICD-10-CM | POA: Diagnosis not present

## 2022-10-12 DIAGNOSIS — Z7984 Long term (current) use of oral hypoglycemic drugs: Secondary | ICD-10-CM

## 2022-10-12 DIAGNOSIS — I2510 Atherosclerotic heart disease of native coronary artery without angina pectoris: Secondary | ICD-10-CM | POA: Diagnosis not present

## 2022-10-12 DIAGNOSIS — Z951 Presence of aortocoronary bypass graft: Secondary | ICD-10-CM

## 2022-10-12 DIAGNOSIS — Z794 Long term (current) use of insulin: Secondary | ICD-10-CM

## 2022-10-12 DIAGNOSIS — Z87891 Personal history of nicotine dependence: Secondary | ICD-10-CM

## 2022-10-12 HISTORY — PX: TEE WITHOUT CARDIOVERSION: SHX5443

## 2022-10-12 HISTORY — PX: CORONARY ARTERY BYPASS GRAFT: SHX141

## 2022-10-12 HISTORY — DX: Presence of aortocoronary bypass graft: Z95.1

## 2022-10-12 LAB — COMPREHENSIVE METABOLIC PANEL
ALT: 24 U/L (ref 0–44)
AST: 16 U/L (ref 15–41)
Albumin: 3.3 g/dL — ABNORMAL LOW (ref 3.5–5.0)
Alkaline Phosphatase: 91 U/L (ref 38–126)
Anion gap: 8 (ref 5–15)
BUN: 32 mg/dL — ABNORMAL HIGH (ref 8–23)
CO2: 24 mmol/L (ref 22–32)
Calcium: 9.3 mg/dL (ref 8.9–10.3)
Chloride: 104 mmol/L (ref 98–111)
Creatinine, Ser: 1.61 mg/dL — ABNORMAL HIGH (ref 0.44–1.00)
GFR, Estimated: 35 mL/min — ABNORMAL LOW (ref >60)
Glucose, Bld: 253 mg/dL — ABNORMAL HIGH (ref 70–99)
Potassium: 4.2 mmol/L (ref 3.5–5.1)
Sodium: 136 mmol/L (ref 135–145)
Total Bilirubin: 0.6 mg/dL (ref 0.3–1.2)
Total Protein: 6.7 g/dL (ref 6.5–8.1)

## 2022-10-12 LAB — POCT I-STAT 7, (LYTES, BLD GAS, ICA,H+H)
Acid-Base Excess: 7 mmol/L — ABNORMAL HIGH (ref 0.0–2.0)
Acid-Base Excess: 1 mmol/L (ref 0.0–2.0)
Acid-Base Excess: 3 mmol/L — ABNORMAL HIGH (ref 0.0–2.0)
Acid-base deficit: 2 mmol/L (ref 0.0–2.0)
Acid-base deficit: 2 mmol/L (ref 0.0–2.0)
Acid-base deficit: 5 mmol/L — ABNORMAL HIGH (ref 0.0–2.0)
Acid-base deficit: 2 mmol/L (ref 0.0–2.0)
Acid-base deficit: 3 mmol/L — ABNORMAL HIGH (ref 0.0–2.0)
Bicarbonate: 24.3 mmol/L (ref 20.0–28.0)
Bicarbonate: 32.7 mmol/L — ABNORMAL HIGH (ref 20.0–28.0)
Bicarbonate: 25.3 mmol/L (ref 20.0–28.0)
Bicarbonate: 27.6 mmol/L (ref 20.0–28.0)
Bicarbonate: 24.4 mmol/L (ref 20.0–28.0)
Bicarbonate: 23.2 mmol/L (ref 20.0–28.0)
Bicarbonate: 26.3 mmol/L (ref 20.0–28.0)
Bicarbonate: 23.3 mmol/L (ref 20.0–28.0)
Calcium, Ion: 1.2 mmol/L (ref 1.15–1.40)
Calcium, Ion: 1 mmol/L — ABNORMAL LOW (ref 1.15–1.40)
Calcium, Ion: 0.98 mmol/L — ABNORMAL LOW (ref 1.15–1.40)
Calcium, Ion: 1.1 mmol/L — ABNORMAL LOW (ref 1.15–1.40)
Calcium, Ion: 1.1 mmol/L — ABNORMAL LOW (ref 1.15–1.40)
Calcium, Ion: 1.15 mmol/L (ref 1.15–1.40)
Calcium, Ion: 1.1 mmol/L — ABNORMAL LOW (ref 1.15–1.40)
Calcium, Ion: 1.14 mmol/L — ABNORMAL LOW (ref 1.15–1.40)
HCT: 38 % (ref 36.0–46.0)
HCT: 25 % — ABNORMAL LOW (ref 36.0–46.0)
HCT: 21 % — ABNORMAL LOW (ref 36.0–46.0)
HCT: 27 % — ABNORMAL LOW (ref 36.0–46.0)
HCT: 29 % — ABNORMAL LOW (ref 36.0–46.0)
HCT: 36 % (ref 36.0–46.0)
HCT: 35 % — ABNORMAL LOW (ref 36.0–46.0)
HCT: 34 % — ABNORMAL LOW (ref 36.0–46.0)
Hemoglobin: 12.9 g/dL (ref 12.0–15.0)
Hemoglobin: 8.5 g/dL — ABNORMAL LOW (ref 12.0–15.0)
Hemoglobin: 7.1 g/dL — ABNORMAL LOW (ref 12.0–15.0)
Hemoglobin: 9.2 g/dL — ABNORMAL LOW (ref 12.0–15.0)
Hemoglobin: 9.9 g/dL — ABNORMAL LOW (ref 12.0–15.0)
Hemoglobin: 12.2 g/dL (ref 12.0–15.0)
Hemoglobin: 11.9 g/dL — ABNORMAL LOW (ref 12.0–15.0)
Hemoglobin: 11.6 g/dL — ABNORMAL LOW (ref 12.0–15.0)
O2 Saturation: 100 %
O2 Saturation: 100 %
O2 Saturation: 100 %
O2 Saturation: 100 %
O2 Saturation: 97 %
O2 Saturation: 90 %
O2 Saturation: 90 %
O2 Saturation: 96 %
Patient temperature: 36.7
Patient temperature: 36.9
Patient temperature: 37
Potassium: 4.3 mmol/L (ref 3.5–5.1)
Potassium: 4.5 mmol/L (ref 3.5–5.1)
Potassium: 5.5 mmol/L — ABNORMAL HIGH (ref 3.5–5.1)
Potassium: 6 mmol/L — ABNORMAL HIGH (ref 3.5–5.1)
Potassium: 5.4 mmol/L — ABNORMAL HIGH (ref 3.5–5.1)
Potassium: 4.5 mmol/L (ref 3.5–5.1)
Potassium: 4.8 mmol/L (ref 3.5–5.1)
Potassium: 4.4 mmol/L (ref 3.5–5.1)
Sodium: 137 mmol/L (ref 135–145)
Sodium: 138 mmol/L (ref 135–145)
Sodium: 136 mmol/L (ref 135–145)
Sodium: 136 mmol/L (ref 135–145)
Sodium: 136 mmol/L (ref 135–145)
Sodium: 136 mmol/L (ref 135–145)
Sodium: 137 mmol/L (ref 135–145)
Sodium: 138 mmol/L (ref 135–145)
TCO2: 26 mmol/L (ref 22–32)
TCO2: 34 mmol/L — ABNORMAL HIGH (ref 22–32)
TCO2: 26 mmol/L (ref 22–32)
TCO2: 29 mmol/L (ref 22–32)
TCO2: 26 mmol/L (ref 22–32)
TCO2: 25 mmol/L (ref 22–32)
TCO2: 28 mmol/L (ref 22–32)
TCO2: 25 mmol/L (ref 22–32)
pCO2 arterial: 46.3 mmHg (ref 32–48)
pCO2 arterial: 50.1 mmHg — ABNORMAL HIGH (ref 32–48)
pCO2 arterial: 38.3 mmHg (ref 32–48)
pCO2 arterial: 40.4 mmHg (ref 32–48)
pCO2 arterial: 46 mmHg (ref 32–48)
pCO2 arterial: 55.4 mmHg — ABNORMAL HIGH (ref 32–48)
pCO2 arterial: 61.6 mmHg — ABNORMAL HIGH (ref 32–48)
pCO2 arterial: 47.8 mmHg (ref 32–48)
pH, Arterial: 7.328 — ABNORMAL LOW (ref 7.35–7.45)
pH, Arterial: 7.424 (ref 7.35–7.45)
pH, Arterial: 7.428 (ref 7.35–7.45)
pH, Arterial: 7.443 (ref 7.35–7.45)
pH, Arterial: 7.333 — ABNORMAL LOW (ref 7.35–7.45)
pH, Arterial: 7.228 — ABNORMAL LOW (ref 7.35–7.45)
pH, Arterial: 7.238 — ABNORMAL LOW (ref 7.35–7.45)
pH, Arterial: 7.297 — ABNORMAL LOW (ref 7.35–7.45)
pO2, Arterial: 442 mmHg — ABNORMAL HIGH (ref 83–108)
pO2, Arterial: 469 mmHg — ABNORMAL HIGH (ref 83–108)
pO2, Arterial: 439 mmHg — ABNORMAL HIGH (ref 83–108)
pO2, Arterial: 519 mmHg — ABNORMAL HIGH (ref 83–108)
pO2, Arterial: 96 mmHg (ref 83–108)
pO2, Arterial: 71 mmHg — ABNORMAL LOW (ref 83–108)
pO2, Arterial: 69 mmHg — ABNORMAL LOW (ref 83–108)
pO2, Arterial: 91 mmHg (ref 83–108)

## 2022-10-12 LAB — CBC WITH DIFFERENTIAL/PLATELET
Abs Immature Granulocytes: 0.17 10*3/uL — ABNORMAL HIGH (ref 0.00–0.07)
Basophils Absolute: 0.1 10*3/uL (ref 0.0–0.1)
Basophils Relative: 1 %
Eosinophils Absolute: 0.7 10*3/uL — ABNORMAL HIGH (ref 0.0–0.5)
Eosinophils Relative: 6 %
HCT: 32 % — ABNORMAL LOW (ref 36.0–46.0)
Hemoglobin: 10.7 g/dL — ABNORMAL LOW (ref 12.0–15.0)
Immature Granulocytes: 2 %
Lymphocytes Relative: 26 %
Lymphs Abs: 2.7 10*3/uL (ref 0.7–4.0)
MCH: 31.2 pg (ref 26.0–34.0)
MCHC: 33.4 g/dL (ref 30.0–36.0)
MCV: 93.3 fL (ref 80.0–100.0)
Monocytes Absolute: 1 10*3/uL (ref 0.1–1.0)
Monocytes Relative: 9 %
Neutro Abs: 6 10*3/uL (ref 1.7–7.7)
Neutrophils Relative %: 56 %
Platelets: 319 10*3/uL (ref 150–400)
RBC: 3.43 MIL/uL — ABNORMAL LOW (ref 3.87–5.11)
RDW: 13.2 % (ref 11.5–15.5)
WBC: 10.6 10*3/uL — ABNORMAL HIGH (ref 4.0–10.5)
nRBC: 0 % (ref 0.0–0.2)

## 2022-10-12 LAB — POCT I-STAT, CHEM 8
BUN: 26 mg/dL — ABNORMAL HIGH (ref 8–23)
BUN: 27 mg/dL — ABNORMAL HIGH (ref 8–23)
BUN: 27 mg/dL — ABNORMAL HIGH (ref 8–23)
BUN: 27 mg/dL — ABNORMAL HIGH (ref 8–23)
Calcium, Ion: 1.15 mmol/L (ref 1.15–1.40)
Calcium, Ion: 1.09 mmol/L — ABNORMAL LOW (ref 1.15–1.40)
Calcium, Ion: 1.28 mmol/L (ref 1.15–1.40)
Calcium, Ion: 1.11 mmol/L — ABNORMAL LOW (ref 1.15–1.40)
Chloride: 103 mmol/L (ref 98–111)
Chloride: 100 mmol/L (ref 98–111)
Chloride: 102 mmol/L (ref 98–111)
Chloride: 101 mmol/L (ref 98–111)
Creatinine, Ser: 1.2 mg/dL — ABNORMAL HIGH (ref 0.44–1.00)
Creatinine, Ser: 1.2 mg/dL — ABNORMAL HIGH (ref 0.44–1.00)
Creatinine, Ser: 1.2 mg/dL — ABNORMAL HIGH (ref 0.44–1.00)
Creatinine, Ser: 1.1 mg/dL — ABNORMAL HIGH (ref 0.44–1.00)
Glucose, Bld: 246 mg/dL — ABNORMAL HIGH (ref 70–99)
Glucose, Bld: 159 mg/dL — ABNORMAL HIGH (ref 70–99)
Glucose, Bld: 195 mg/dL — ABNORMAL HIGH (ref 70–99)
Glucose, Bld: 223 mg/dL — ABNORMAL HIGH (ref 70–99)
HCT: 23 % — ABNORMAL LOW (ref 36.0–46.0)
HCT: 23 % — ABNORMAL LOW (ref 36.0–46.0)
HCT: 28 % — ABNORMAL LOW (ref 36.0–46.0)
HCT: 52 % — ABNORMAL HIGH (ref 36.0–46.0)
Hemoglobin: 7.8 g/dL — ABNORMAL LOW (ref 12.0–15.0)
Hemoglobin: 7.8 g/dL — ABNORMAL LOW (ref 12.0–15.0)
Hemoglobin: 9.5 g/dL — ABNORMAL LOW (ref 12.0–15.0)
Hemoglobin: 17.7 g/dL — ABNORMAL HIGH (ref 12.0–15.0)
Potassium: 4.4 mmol/L (ref 3.5–5.1)
Potassium: 4.3 mmol/L (ref 3.5–5.1)
Potassium: 4.7 mmol/L (ref 3.5–5.1)
Potassium: 5.4 mmol/L — ABNORMAL HIGH (ref 3.5–5.1)
Sodium: 137 mmol/L (ref 135–145)
Sodium: 137 mmol/L (ref 135–145)
Sodium: 138 mmol/L (ref 135–145)
Sodium: 137 mmol/L (ref 135–145)
TCO2: 27 mmol/L (ref 22–32)
TCO2: 24 mmol/L (ref 22–32)
TCO2: 26 mmol/L (ref 22–32)
TCO2: 25 mmol/L (ref 22–32)

## 2022-10-12 LAB — IRON AND TIBC
Iron: 68 ug/dL (ref 28–170)
Saturation Ratios: 18 % (ref 10.4–31.8)
TIBC: 375 ug/dL (ref 250–450)
UIBC: 307 ug/dL

## 2022-10-12 LAB — CBC
HCT: 37.5 % (ref 36.0–46.0)
HCT: 33.1 % — ABNORMAL LOW (ref 36.0–46.0)
Hemoglobin: 12.6 g/dL (ref 12.0–15.0)
Hemoglobin: 11.6 g/dL — ABNORMAL LOW (ref 12.0–15.0)
MCH: 30.8 pg (ref 26.0–34.0)
MCH: 31.7 pg (ref 26.0–34.0)
MCHC: 33.6 g/dL (ref 30.0–36.0)
MCHC: 35 g/dL (ref 30.0–36.0)
MCV: 91.7 fL (ref 80.0–100.0)
MCV: 90.4 fL (ref 80.0–100.0)
Platelets: 238 10*3/uL (ref 150–400)
Platelets: 215 10*3/uL (ref 150–400)
RBC: 4.09 MIL/uL (ref 3.87–5.11)
RBC: 3.66 MIL/uL — ABNORMAL LOW (ref 3.87–5.11)
RDW: 13.5 % (ref 11.5–15.5)
RDW: 14 % (ref 11.5–15.5)
WBC: 18.6 10*3/uL — ABNORMAL HIGH (ref 4.0–10.5)
WBC: 15.2 10*3/uL — ABNORMAL HIGH (ref 4.0–10.5)
nRBC: 0 % (ref 0.0–0.2)
nRBC: 0 % (ref 0.0–0.2)

## 2022-10-12 LAB — GLUCOSE, CAPILLARY
Glucose-Capillary: 251 mg/dL — ABNORMAL HIGH (ref 70–99)
Glucose-Capillary: 211 mg/dL — ABNORMAL HIGH (ref 70–99)
Glucose-Capillary: 205 mg/dL — ABNORMAL HIGH (ref 70–99)
Glucose-Capillary: 175 mg/dL — ABNORMAL HIGH (ref 70–99)
Glucose-Capillary: 153 mg/dL — ABNORMAL HIGH (ref 70–99)
Glucose-Capillary: 128 mg/dL — ABNORMAL HIGH (ref 70–99)
Glucose-Capillary: 146 mg/dL — ABNORMAL HIGH (ref 70–99)
Glucose-Capillary: 160 mg/dL — ABNORMAL HIGH (ref 70–99)
Glucose-Capillary: 144 mg/dL — ABNORMAL HIGH (ref 70–99)
Glucose-Capillary: 114 mg/dL — ABNORMAL HIGH (ref 70–99)
Glucose-Capillary: 147 mg/dL — ABNORMAL HIGH (ref 70–99)
Glucose-Capillary: 161 mg/dL — ABNORMAL HIGH (ref 70–99)

## 2022-10-12 LAB — HEMOGLOBIN AND HEMATOCRIT, BLOOD
HCT: 26 % — ABNORMAL LOW (ref 36.0–46.0)
Hemoglobin: 8.8 g/dL — ABNORMAL LOW (ref 12.0–15.0)

## 2022-10-12 LAB — FOLATE: Folate: 9.9 ng/mL (ref >5.9)

## 2022-10-12 LAB — BASIC METABOLIC PANEL
Anion gap: 8 (ref 5–15)
BUN: 24 mg/dL — ABNORMAL HIGH (ref 8–23)
CO2: 23 mmol/L (ref 22–32)
Calcium: 7.9 mg/dL — ABNORMAL LOW (ref 8.9–10.3)
Chloride: 107 mmol/L (ref 98–111)
Creatinine, Ser: 1.18 mg/dL — ABNORMAL HIGH (ref 0.44–1.00)
GFR, Estimated: 51 mL/min — ABNORMAL LOW (ref >60)
Glucose, Bld: 168 mg/dL — ABNORMAL HIGH (ref 70–99)
Potassium: 4.5 mmol/L (ref 3.5–5.1)
Sodium: 138 mmol/L (ref 135–145)

## 2022-10-12 LAB — ECHO INTRAOPERATIVE TEE
Height: 60.984 in
Weight: 2891.2 oz

## 2022-10-12 LAB — MAGNESIUM
Magnesium: 1.8 mg/dL (ref 1.7–2.4)
Magnesium: 3.1 mg/dL — ABNORMAL HIGH (ref 1.7–2.4)

## 2022-10-12 LAB — RETICULOCYTES
Immature Retic Fract: 30.1 % — ABNORMAL HIGH (ref 2.3–15.9)
RBC.: 3.42 MIL/uL — ABNORMAL LOW (ref 3.87–5.11)
Retic Count, Absolute: 118.7 10*3/uL (ref 19.0–186.0)
Retic Ct Pct: 3.5 % — ABNORMAL HIGH (ref 0.4–3.1)

## 2022-10-12 LAB — FERRITIN: Ferritin: 63 ng/mL (ref 11–307)

## 2022-10-12 LAB — APTT: aPTT: 33 seconds (ref 24–36)

## 2022-10-12 LAB — HEPARIN LEVEL (UNFRACTIONATED): Heparin Unfractionated: 0.6 IU/mL (ref 0.30–0.70)

## 2022-10-12 LAB — PROTIME-INR
INR: 1.1 (ref 0.8–1.2)
Prothrombin Time: 14.2 seconds (ref 11.4–15.2)

## 2022-10-12 LAB — PLATELET COUNT: Platelets: 190 10*3/uL (ref 150–400)

## 2022-10-12 LAB — VITAMIN B12: Vitamin B-12: 188 pg/mL (ref 180–914)

## 2022-10-12 LAB — PREPARE RBC (CROSSMATCH)

## 2022-10-12 LAB — PHOSPHORUS: Phosphorus: 3.2 mg/dL (ref 2.5–4.6)

## 2022-10-12 SURGERY — CORONARY ARTERY BYPASS GRAFTING (CABG)
Anesthesia: General | Site: Chest

## 2022-10-12 MED ORDER — SODIUM CHLORIDE (PF) 0.9 % IJ SOLN
OROMUCOSAL | Status: DC | PRN
  Administered 2022-10-12 (×3): 4 mL via TOPICAL

## 2022-10-12 MED ORDER — ARTIFICIAL TEARS OPHTHALMIC OINT
TOPICAL_OINTMENT | OPHTHALMIC | Status: AC
  Filled 2022-10-12: qty 3.5

## 2022-10-12 MED ORDER — SODIUM BICARBONATE 8.4 % IV SOLN
50.0000 meq | Freq: Once | INTRAVENOUS | Status: AC
  Administered 2022-10-12: 50 meq via INTRAVENOUS

## 2022-10-12 MED ORDER — ALBUMIN HUMAN 5 % IV SOLN
250.0000 mL | INTRAVENOUS | Status: DC | PRN
  Administered 2022-10-12 (×2): 12.5 g via INTRAVENOUS
  Filled 2022-10-12 (×2): qty 250

## 2022-10-12 MED ORDER — PHENYLEPHRINE 80 MCG/ML (10ML) SYRINGE FOR IV PUSH (FOR BLOOD PRESSURE SUPPORT)
PREFILLED_SYRINGE | INTRAVENOUS | Status: AC
  Filled 2022-10-12: qty 10

## 2022-10-12 MED ORDER — ACETAMINOPHEN 160 MG/5ML PO SOLN: 650.0000 mg | Freq: Once | ORAL | Status: AC

## 2022-10-12 MED ORDER — THROMBIN 20000 UNITS EX SOLR
CUTANEOUS | Status: DC | PRN
  Administered 2022-10-12: 20000 [IU] via TOPICAL

## 2022-10-12 MED ORDER — PHENYLEPHRINE HCL-NACL 20-0.9 MG/250ML-% IV SOLN: 0.0000 ug/min | INTRAVENOUS | Status: DC

## 2022-10-12 MED ORDER — BISACODYL 10 MG RE SUPP: 10.0000 mg | Freq: Every day | RECTAL | Status: DC

## 2022-10-12 MED ORDER — LACTATED RINGERS IV SOLN: INTRAVENOUS | Status: DC

## 2022-10-12 MED ORDER — ACETAMINOPHEN 160 MG/5ML PO SOLN: 1000.0000 mg | Freq: Four times a day (QID) | ORAL | Status: AC

## 2022-10-12 MED ORDER — NITROGLYCERIN IN D5W 200-5 MCG/ML-% IV SOLN
0.0000 ug/min | INTRAVENOUS | Status: DC
  Administered 2022-10-12: 5 ug/min via INTRAVENOUS
  Filled 2022-10-12: qty 250

## 2022-10-12 MED ORDER — HEPARIN SODIUM (PORCINE) 1000 UNIT/ML IJ SOLN
INTRAMUSCULAR | Status: AC
  Filled 2022-10-12: qty 1

## 2022-10-12 MED ORDER — DEXTROSE 50 % IV SOLN: 0.0000 mL | INTRAVENOUS | Status: DC | PRN

## 2022-10-12 MED ORDER — LACTATED RINGERS IV SOLN: INTRAVENOUS | Status: DC | PRN

## 2022-10-12 MED ORDER — MIDAZOLAM HCL (PF) 10 MG/2ML IJ SOLN
INTRAMUSCULAR | Status: AC
  Filled 2022-10-12: qty 2

## 2022-10-12 MED ORDER — FENTANYL CITRATE (PF) 250 MCG/5ML IJ SOLN
INTRAMUSCULAR | Status: AC
  Filled 2022-10-12: qty 5

## 2022-10-12 MED ORDER — MAGNESIUM SULFATE 4 GM/100ML IV SOLN
4.0000 g | Freq: Once | INTRAVENOUS | Status: AC
  Administered 2022-10-12: 4 g via INTRAVENOUS
  Filled 2022-10-12: qty 100

## 2022-10-12 MED ORDER — ASPIRIN 325 MG PO TBEC
325.0000 mg | DELAYED_RELEASE_TABLET | Freq: Every day | ORAL | Status: DC
  Administered 2022-10-13 – 2022-10-14 (×2): 325 mg via ORAL
  Filled 2022-10-12 (×2): qty 1

## 2022-10-12 MED ORDER — MIDAZOLAM HCL 2 MG/2ML IJ SOLN: 2.0000 mg | INTRAMUSCULAR | Status: DC | PRN

## 2022-10-12 MED ORDER — PHENYLEPHRINE HCL-NACL 20-0.9 MG/250ML-% IV SOLN
INTRAVENOUS | Status: DC | PRN
  Administered 2022-10-12: 25 ug/min via INTRAVENOUS

## 2022-10-12 MED ORDER — METOPROLOL TARTRATE 12.5 MG HALF TABLET
12.5000 mg | ORAL_TABLET | Freq: Two times a day (BID) | ORAL | Status: DC
  Administered 2022-10-13 – 2022-10-14 (×3): 12.5 mg via ORAL
  Filled 2022-10-12 (×3): qty 1

## 2022-10-12 MED ORDER — PROPOFOL 10 MG/ML IV BOLUS
INTRAVENOUS | Status: AC
  Filled 2022-10-12: qty 20

## 2022-10-12 MED ORDER — SODIUM CHLORIDE 0.45 % IV SOLN: INTRAVENOUS | Status: DC | PRN

## 2022-10-12 MED ORDER — CEFAZOLIN SODIUM-DEXTROSE 2-4 GM/100ML-% IV SOLN
2.0000 g | Freq: Three times a day (TID) | INTRAVENOUS | Status: AC
  Administered 2022-10-12 – 2022-10-14 (×6): 2 g via INTRAVENOUS
  Filled 2022-10-12 (×6): qty 100

## 2022-10-12 MED ORDER — ORAL CARE MOUTH RINSE: 15.0000 mL | OROMUCOSAL | Status: DC | PRN

## 2022-10-12 MED ORDER — PROPOFOL 10 MG/ML IV BOLUS
INTRAVENOUS | Status: DC | PRN
  Administered 2022-10-12: 100 mg via INTRAVENOUS

## 2022-10-12 MED ORDER — ROCURONIUM BROMIDE 10 MG/ML (PF) SYRINGE
PREFILLED_SYRINGE | INTRAVENOUS | Status: DC | PRN
  Administered 2022-10-12 (×2): 50 mg via INTRAVENOUS
  Administered 2022-10-12: 40 mg via INTRAVENOUS
  Administered 2022-10-12: 100 mg via INTRAVENOUS

## 2022-10-12 MED ORDER — DOPAMINE-DEXTROSE 3.2-5 MG/ML-% IV SOLN
INTRAVENOUS | Status: DC | PRN
  Administered 2022-10-12: 2 ug/kg/min via INTRAVENOUS

## 2022-10-12 MED ORDER — HEMOSTATIC AGENTS (NO CHARGE) OPTIME
TOPICAL | Status: DC | PRN
  Administered 2022-10-12: 1 via TOPICAL

## 2022-10-12 MED ORDER — INSULIN REGULAR(HUMAN) IN NACL 100-0.9 UT/100ML-% IV SOLN
INTRAVENOUS | Status: DC | PRN
  Administered 2022-10-12: 1 [IU]/h via INTRAVENOUS

## 2022-10-12 MED ORDER — FENTANYL CITRATE (PF) 250 MCG/5ML IJ SOLN
INTRAMUSCULAR | Status: DC | PRN
  Administered 2022-10-12 (×2): 100 ug via INTRAVENOUS
  Administered 2022-10-12: 200 ug via INTRAVENOUS
  Administered 2022-10-12: 50 ug via INTRAVENOUS
  Administered 2022-10-12 (×3): 100 ug via INTRAVENOUS
  Administered 2022-10-12 (×2): 50 ug via INTRAVENOUS
  Administered 2022-10-12 (×4): 100 ug via INTRAVENOUS

## 2022-10-12 MED ORDER — PANTOPRAZOLE SODIUM 40 MG PO TBEC
40.0000 mg | DELAYED_RELEASE_TABLET | Freq: Every day | ORAL | Status: DC
  Administered 2022-10-14 – 2022-10-18 (×5): 40 mg via ORAL
  Filled 2022-10-12 (×5): qty 1

## 2022-10-12 MED ORDER — DOPAMINE-DEXTROSE 3.2-5 MG/ML-% IV SOLN
2.0000 ug/kg/min | INTRAVENOUS | Status: DC
  Administered 2022-10-12 – 2022-10-13 (×2): 2 ug/kg/min via INTRAVENOUS

## 2022-10-12 MED ORDER — MIDAZOLAM HCL 2 MG/2ML IJ SOLN
INTRAMUSCULAR | Status: DC | PRN
  Administered 2022-10-12: 2 mg via INTRAVENOUS
  Administered 2022-10-12: 1 mg via INTRAVENOUS

## 2022-10-12 MED ORDER — LACTATED RINGERS IV SOLN: 500.0000 mL | Freq: Once | INTRAVENOUS | Status: DC | PRN

## 2022-10-12 MED ORDER — PROTAMINE SULFATE 10 MG/ML IV SOLN
INTRAVENOUS | Status: DC | PRN
  Administered 2022-10-12: 250 mg via INTRAVENOUS

## 2022-10-12 MED ORDER — TRANEXAMIC ACID 1000 MG/10ML IV SOLN
INTRAVENOUS | Status: DC | PRN
  Administered 2022-10-12: 1.5 mg/kg/h via INTRAVENOUS

## 2022-10-12 MED ORDER — BISACODYL 5 MG PO TBEC
10.0000 mg | DELAYED_RELEASE_TABLET | Freq: Every day | ORAL | Status: DC
  Administered 2022-10-13 – 2022-10-14 (×2): 10 mg via ORAL
  Filled 2022-10-12 (×2): qty 2

## 2022-10-12 MED ORDER — OXYCODONE HCL 5 MG PO TABS
5.0000 mg | ORAL_TABLET | ORAL | Status: DC | PRN
  Administered 2022-10-13: 5 mg via ORAL
  Administered 2022-10-13 – 2022-10-15 (×6): 10 mg via ORAL
  Filled 2022-10-12 (×5): qty 2
  Filled 2022-10-12: qty 1
  Filled 2022-10-12: qty 2

## 2022-10-12 MED ORDER — CHLORHEXIDINE GLUCONATE 0.12 % MT SOLN
15.0000 mL | OROMUCOSAL | Status: AC
  Administered 2022-10-12: 15 mL via OROMUCOSAL

## 2022-10-12 MED ORDER — POTASSIUM CHLORIDE 10 MEQ/50ML IV SOLN: 10.0000 meq | INTRAVENOUS | Status: AC

## 2022-10-12 MED ORDER — METOPROLOL TARTRATE 25 MG/10 ML ORAL SUSPENSION
12.5000 mg | Freq: Two times a day (BID) | ORAL | Status: DC
  Administered 2022-10-12: 12.5 mg
  Filled 2022-10-12: qty 10

## 2022-10-12 MED ORDER — TRAMADOL HCL 50 MG PO TABS
50.0000 mg | ORAL_TABLET | ORAL | Status: DC | PRN
  Administered 2022-10-13: 100 mg via ORAL
  Administered 2022-10-13: 50 mg via ORAL
  Administered 2022-10-14: 100 mg via ORAL
  Administered 2022-10-14: 50 mg via ORAL
  Administered 2022-10-15: 100 mg via ORAL
  Filled 2022-10-12: qty 2
  Filled 2022-10-12: qty 1
  Filled 2022-10-12: qty 2
  Filled 2022-10-12: qty 1
  Filled 2022-10-12: qty 2

## 2022-10-12 MED ORDER — ACETAMINOPHEN 500 MG PO TABS
1000.0000 mg | ORAL_TABLET | Freq: Four times a day (QID) | ORAL | Status: AC
  Administered 2022-10-13 – 2022-10-17 (×18): 1000 mg via ORAL
  Filled 2022-10-12 (×19): qty 2

## 2022-10-12 MED ORDER — PLASMA-LYTE A IV SOLN
INTRAVENOUS | Status: DC | PRN
  Administered 2022-10-12: 500 mL

## 2022-10-12 MED ORDER — SODIUM CHLORIDE 0.9% FLUSH
3.0000 mL | Freq: Two times a day (BID) | INTRAVENOUS | Status: DC
  Administered 2022-10-13 – 2022-10-14 (×4): 3 mL via INTRAVENOUS

## 2022-10-12 MED ORDER — ARTIFICIAL TEARS OPHTHALMIC OINT
TOPICAL_OINTMENT | OPHTHALMIC | Status: DC | PRN
  Administered 2022-10-12: 1 via OPHTHALMIC

## 2022-10-12 MED ORDER — ROCURONIUM BROMIDE 10 MG/ML (PF) SYRINGE
PREFILLED_SYRINGE | INTRAVENOUS | Status: AC
  Filled 2022-10-12: qty 30

## 2022-10-12 MED ORDER — SODIUM CHLORIDE 0.9% FLUSH: 3.0000 mL | INTRAVENOUS | Status: DC | PRN

## 2022-10-12 MED ORDER — DEXMEDETOMIDINE HCL IN NACL 400 MCG/100ML IV SOLN
0.0000 ug/kg/h | INTRAVENOUS | Status: DC
  Administered 2022-10-12: 0.7 ug/kg/h via INTRAVENOUS

## 2022-10-12 MED ORDER — ONDANSETRON HCL 4 MG/2ML IJ SOLN
4.0000 mg | Freq: Four times a day (QID) | INTRAMUSCULAR | Status: DC | PRN
  Administered 2022-10-12 – 2022-10-13 (×2): 4 mg via INTRAVENOUS
  Filled 2022-10-12 (×3): qty 2

## 2022-10-12 MED ORDER — 0.9 % SODIUM CHLORIDE (POUR BTL) OPTIME
TOPICAL | Status: DC | PRN
  Administered 2022-10-12: 5000 mL
  Administered 2022-10-12 (×2): 500 mL

## 2022-10-12 MED ORDER — ORAL CARE MOUTH RINSE
15.0000 mL | OROMUCOSAL | Status: DC
  Administered 2022-10-12 (×2): 15 mL via OROMUCOSAL

## 2022-10-12 MED ORDER — SODIUM CHLORIDE 0.9 % IV SOLN: INTRAVENOUS | Status: DC

## 2022-10-12 MED ORDER — MORPHINE SULFATE (PF) 2 MG/ML IV SOLN
1.0000 mg | INTRAVENOUS | Status: DC | PRN
  Administered 2022-10-12 – 2022-10-13 (×7): 2 mg via INTRAVENOUS
  Filled 2022-10-12 (×7): qty 1

## 2022-10-12 MED ORDER — DOCUSATE SODIUM 100 MG PO CAPS
200.0000 mg | ORAL_CAPSULE | Freq: Every day | ORAL | Status: DC
  Administered 2022-10-13 – 2022-10-14 (×2): 200 mg via ORAL
  Filled 2022-10-12 (×2): qty 2

## 2022-10-12 MED ORDER — LIDOCAINE 2% (20 MG/ML) 5 ML SYRINGE
INTRAMUSCULAR | Status: DC | PRN
  Administered 2022-10-12: 100 mg via INTRAVENOUS

## 2022-10-12 MED ORDER — ACETAMINOPHEN 650 MG RE SUPP
650.0000 mg | Freq: Once | RECTAL | Status: AC
  Administered 2022-10-12: 650 mg via RECTAL

## 2022-10-12 MED ORDER — SODIUM CHLORIDE (PF) 0.9 % IJ SOLN
INTRAMUSCULAR | Status: AC
  Filled 2022-10-12: qty 10

## 2022-10-12 MED ORDER — FAMOTIDINE IN NACL 20-0.9 MG/50ML-% IV SOLN
20.0000 mg | Freq: Two times a day (BID) | INTRAVENOUS | Status: AC
  Administered 2022-10-12 (×2): 20 mg via INTRAVENOUS
  Filled 2022-10-12 (×2): qty 50

## 2022-10-12 MED ORDER — ASPIRIN 81 MG PO CHEW: 324.0000 mg | CHEWABLE_TABLET | Freq: Every day | ORAL | Status: DC

## 2022-10-12 MED ORDER — THROMBIN (RECOMBINANT) 20000 UNITS EX SOLR
CUTANEOUS | Status: AC
  Filled 2022-10-12: qty 20000

## 2022-10-12 MED ORDER — VANCOMYCIN HCL IN DEXTROSE 1-5 GM/200ML-% IV SOLN
1000.0000 mg | Freq: Once | INTRAVENOUS | Status: AC
  Administered 2022-10-12: 1000 mg via INTRAVENOUS
  Filled 2022-10-12: qty 200

## 2022-10-12 MED ORDER — SODIUM CHLORIDE 0.9 % IV SOLN: 250.0000 mL | INTRAVENOUS | Status: DC

## 2022-10-12 MED ORDER — CHLORHEXIDINE GLUCONATE CLOTH 2 % EX PADS
6.0000 | MEDICATED_PAD | Freq: Every day | CUTANEOUS | Status: DC
  Administered 2022-10-12 – 2022-10-14 (×4): 6 via TOPICAL

## 2022-10-12 MED ORDER — METOPROLOL TARTRATE 5 MG/5ML IV SOLN
2.5000 mg | INTRAVENOUS | Status: DC | PRN
  Administered 2022-10-12 (×2): 2.5 mg via INTRAVENOUS
  Administered 2022-10-13 (×2): 5 mg via INTRAVENOUS
  Filled 2022-10-12 (×3): qty 5

## 2022-10-12 MED ORDER — PROTAMINE SULFATE 10 MG/ML IV SOLN
INTRAVENOUS | Status: AC
  Filled 2022-10-12: qty 25

## 2022-10-12 MED ORDER — INSULIN REGULAR(HUMAN) IN NACL 100-0.9 UT/100ML-% IV SOLN
INTRAVENOUS | Status: DC
  Administered 2022-10-12: 5.5 [IU]/h via INTRAVENOUS
  Filled 2022-10-12: qty 100

## 2022-10-12 MED ORDER — HEPARIN SODIUM (PORCINE) 1000 UNIT/ML IJ SOLN
INTRAMUSCULAR | Status: DC | PRN
  Administered 2022-10-12: 29000 [IU] via INTRAVENOUS

## 2022-10-12 MED ORDER — DEXMEDETOMIDINE HCL IN NACL 400 MCG/100ML IV SOLN
INTRAVENOUS | Status: DC | PRN
  Administered 2022-10-12: .7 ug/kg/h via INTRAVENOUS

## 2022-10-12 MED FILL — Heparin Sodium (Porcine) Inj 1000 Unit/ML: INTRAMUSCULAR | Qty: 2500 | Status: AC

## 2022-10-12 SURGICAL SUPPLY — 118 items
ADAPTER MULTI PERFUSION 15 (ADAPTER) IMPLANT
BAG DECANTER FOR FLEXI CONT (MISCELLANEOUS) ×2 IMPLANT
BLADE CLIPPER SURG (BLADE) ×2 IMPLANT
BLADE STERNUM SYSTEM 6 (BLADE) ×2 IMPLANT
BLADE SURG 11 STRL SS (BLADE) IMPLANT
BNDG ELASTIC 4X5.8 VLCR STR LF (GAUZE/BANDAGES/DRESSINGS) ×2 IMPLANT
BNDG ELASTIC 6X5.8 VLCR STR LF (GAUZE/BANDAGES/DRESSINGS) ×2 IMPLANT
BNDG GAUZE DERMACEA FLUFF 4 (GAUZE/BANDAGES/DRESSINGS) ×2 IMPLANT
CANISTER SUCT 3000ML PPV (MISCELLANEOUS) ×2 IMPLANT
CANNULA AORTIC ROOT 9FR (CANNULA) IMPLANT
CANNULA ARTERIAL VENT 3/8 20FR (CANNULA) IMPLANT
CANNULA MC2 2 STG 36/46 NON-V (CANNULA) IMPLANT
CANNULA VENOUS 2 STG 34/46 (CANNULA) ×2
CATH ROBINSON RED A/P 18FR (CATHETERS) ×4 IMPLANT
CATH THORACIC 28FR (CATHETERS) ×2 IMPLANT
CATH THORACIC 36FR (CATHETERS) ×2 IMPLANT
CATH THORACIC 36FR RT ANG (CATHETERS) ×2 IMPLANT
CLIP TI WIDE RED SMALL 24 (CLIP) IMPLANT
CLIP VESOCCLUDE MED 24/CT (CLIP) IMPLANT
CLIP VESOCCLUDE SM WIDE 24/CT (CLIP) IMPLANT
CONTAINER PROTECT SURGISLUSH (MISCELLANEOUS) ×4 IMPLANT
DEFOGGER ANTIFOG KIT (MISCELLANEOUS) IMPLANT
DERMABOND ADVANCED .7 DNX12 (GAUZE/BANDAGES/DRESSINGS) IMPLANT
DRAPE CARDIOVASCULAR INCISE (DRAPES) ×2
DRAPE SRG 135X102X78XABS (DRAPES) ×2 IMPLANT
DRAPE WARM FLUID 44X44 (DRAPES) ×2 IMPLANT
DRSG COVADERM 4X14 (GAUZE/BANDAGES/DRESSINGS) ×2 IMPLANT
DRSG EMULSION OIL 3X3 NADH (GAUZE/BANDAGES/DRESSINGS) IMPLANT
ELECT CAUTERY BLADE 6.4 (BLADE) ×2 IMPLANT
ELECT REM PT RETURN 9FT ADLT (ELECTROSURGICAL) ×4
ELECTRODE REM PT RTRN 9FT ADLT (ELECTROSURGICAL) ×4 IMPLANT
FELT TEFLON 1X6 (MISCELLANEOUS) ×4 IMPLANT
GAUZE 4X4 16PLY ~~LOC~~+RFID DBL (SPONGE) ×2 IMPLANT
GAUZE SPONGE 4X4 12PLY STRL (GAUZE/BANDAGES/DRESSINGS) ×4 IMPLANT
GAUZE SPONGE 4X4 12PLY STRL LF (GAUZE/BANDAGES/DRESSINGS) IMPLANT
GLOVE BIO SURGEON STRL SZ 6 (GLOVE) IMPLANT
GLOVE BIO SURGEON STRL SZ 6.5 (GLOVE) IMPLANT
GLOVE BIO SURGEON STRL SZ7 (GLOVE) IMPLANT
GLOVE BIO SURGEON STRL SZ7.5 (GLOVE) IMPLANT
GLOVE BIOGEL PI IND STRL 6 (GLOVE) IMPLANT
GLOVE BIOGEL PI IND STRL 6.5 (GLOVE) IMPLANT
GLOVE BIOGEL PI IND STRL 7.0 (GLOVE) IMPLANT
GLOVE BIOGEL PI IND STRL 7.5 (GLOVE) IMPLANT
GLOVE ORTHO TXT STRL SZ7.5 (GLOVE) IMPLANT
GLOVE SS BIOGEL STRL SZ 6 (GLOVE) IMPLANT
GLOVE SURG MICRO LTX SZ7 (GLOVE) ×4 IMPLANT
GLOVE SURG SS PI 8.0 STRL IVOR (GLOVE) IMPLANT
GOWN STRL REUS W/ TWL LRG LVL3 (GOWN DISPOSABLE) ×8 IMPLANT
GOWN STRL REUS W/ TWL XL LVL3 (GOWN DISPOSABLE) ×2 IMPLANT
GOWN STRL REUS W/TWL LRG LVL3 (GOWN DISPOSABLE) ×24
GOWN STRL REUS W/TWL XL LVL3 (GOWN DISPOSABLE) ×10
HEMOSTAT POWDER SURGIFOAM 1G (HEMOSTASIS) ×6 IMPLANT
HEMOSTAT SURGICEL 2X14 (HEMOSTASIS) ×2 IMPLANT
INSERT FOGARTY 61MM (MISCELLANEOUS) IMPLANT
INSERT FOGARTY XLG (MISCELLANEOUS) IMPLANT
KIT BASIN OR (CUSTOM PROCEDURE TRAY) ×2 IMPLANT
KIT CATH CPB BARTLE (MISCELLANEOUS) ×2 IMPLANT
KIT SUCTION CATH 14FR (SUCTIONS) ×2 IMPLANT
KIT TURNOVER KIT B (KITS) ×2 IMPLANT
KIT VASOVIEW HEMOPRO 2 VH 4000 (KITS) ×2 IMPLANT
NS IRRIG 1000ML POUR BTL (IV SOLUTION) ×10 IMPLANT
NS IRRIG 500ML POUR BTL (IV SOLUTION) IMPLANT
PACK E OPEN HEART (SUTURE) ×2 IMPLANT
PACK OPEN HEART (CUSTOM PROCEDURE TRAY) ×2 IMPLANT
PAD ARMBOARD 7.5X6 YLW CONV (MISCELLANEOUS) ×4 IMPLANT
PAD ELECT DEFIB RADIOL ZOLL (MISCELLANEOUS) ×2 IMPLANT
PENCIL BUTTON HOLSTER BLD 10FT (ELECTRODE) ×2 IMPLANT
POSITIONER HEAD DONUT 9IN (MISCELLANEOUS) ×2 IMPLANT
PUNCH AORTIC ROTATE 4.0MM (MISCELLANEOUS) IMPLANT
PUNCH AORTIC ROTATE 4.5MM 8IN (MISCELLANEOUS) ×2 IMPLANT
PUNCH AORTIC ROTATE 5MM 8IN (MISCELLANEOUS) IMPLANT
SET MPS 3-ND DEL (MISCELLANEOUS) IMPLANT
SPONGE INTESTINAL PEANUT (DISPOSABLE) IMPLANT
SPONGE T-LAP 18X18 ~~LOC~~+RFID (SPONGE) ×8 IMPLANT
SPONGE T-LAP 4X18 ~~LOC~~+RFID (SPONGE) ×4 IMPLANT
STRIP CLOSURE SKIN 1/2X4 (GAUZE/BANDAGES/DRESSINGS) IMPLANT
STRIP CLOSURE SKIN 1/4X3 (GAUZE/BANDAGES/DRESSINGS) IMPLANT
SUPPORT HEART JANKE-BARRON (MISCELLANEOUS) ×2 IMPLANT
SUT BONE WAX W31G (SUTURE) ×2 IMPLANT
SUT MNCRL AB 4-0 PS2 18 (SUTURE) IMPLANT
SUT PROLENE 3 0 SH DA (SUTURE) IMPLANT
SUT PROLENE 3 0 SH1 36 (SUTURE) ×2 IMPLANT
SUT PROLENE 4 0 RB 1 (SUTURE) ×2
SUT PROLENE 4 0 SH DA (SUTURE) IMPLANT
SUT PROLENE 4-0 RB1 .5 CRCL 36 (SUTURE) IMPLANT
SUT PROLENE 5 0 C 1 36 (SUTURE) IMPLANT
SUT PROLENE 6 0 C 1 30 (SUTURE) IMPLANT
SUT PROLENE 7 0 BV 1 (SUTURE) IMPLANT
SUT PROLENE 7 0 BV1 MDA (SUTURE) ×2 IMPLANT
SUT PROLENE 8 0 BV175 6 (SUTURE) IMPLANT
SUT SILK  1 MH (SUTURE)
SUT SILK 1 MH (SUTURE) IMPLANT
SUT SILK 2 0 SH (SUTURE) IMPLANT
SUT SILK 2 0 SH CR/8 (SUTURE) IMPLANT
SUT STEEL 6MS V (SUTURE) IMPLANT
SUT STEEL STERNAL CCS#1 18IN (SUTURE) IMPLANT
SUT STEEL SZ 6 DBL 3X14 BALL (SUTURE) IMPLANT
SUT VIC AB 1 CTX 36 (SUTURE) ×8
SUT VIC AB 1 CTX36XBRD ANBCTR (SUTURE) ×4 IMPLANT
SUT VIC AB 2-0 CT1 27 (SUTURE) ×4
SUT VIC AB 2-0 CT1 TAPERPNT 27 (SUTURE) IMPLANT
SUT VIC AB 2-0 CTX 27 (SUTURE) IMPLANT
SUT VIC AB 3-0 SH 27 (SUTURE)
SUT VIC AB 3-0 SH 27X BRD (SUTURE) IMPLANT
SUT VIC AB 3-0 X1 27 (SUTURE) IMPLANT
SUT VICRYL 4-0 PS2 18IN ABS (SUTURE) IMPLANT
SYSTEM SAHARA CHEST DRAIN ATS (WOUND CARE) ×2 IMPLANT
TAPE CLOTH SURG 4X10 WHT LF (GAUZE/BANDAGES/DRESSINGS) IMPLANT
TAPE PAPER 2X10 WHT MICROPORE (GAUZE/BANDAGES/DRESSINGS) IMPLANT
TOWEL GREEN STERILE (TOWEL DISPOSABLE) ×2 IMPLANT
TOWEL GREEN STERILE FF (TOWEL DISPOSABLE) ×2 IMPLANT
TRAY FOLEY SLVR 16FR TEMP STAT (SET/KITS/TRAYS/PACK) ×2 IMPLANT
TUBE CONNECTING 20X1/4 (TUBING) IMPLANT
TUBE SUCT INTRACARD DLP 20F (MISCELLANEOUS) IMPLANT
TUBE SUCTION CARDIAC 10FR (CANNULA) IMPLANT
TUBING LAP HI FLOW INSUFFLATIO (TUBING) ×2 IMPLANT
UNDERPAD 30X36 HEAVY ABSORB (UNDERPADS AND DIAPERS) ×2 IMPLANT
WATER STERILE IRR 1000ML POUR (IV SOLUTION) ×4 IMPLANT

## 2022-10-12 NOTE — Anesthesia Procedure Notes (Signed)
Procedure Name: Intubation Date/Time: 10/12/2022 7:52 AM  Performed by: Bryson Corona, CRNAPre-anesthesia Checklist: Patient identified, Emergency Drugs available, Suction available and Patient being monitored Patient Re-evaluated:Patient Re-evaluated prior to induction Oxygen Delivery Method: Circle System Utilized Preoxygenation: Pre-oxygenation with 100% oxygen Induction Type: IV induction Ventilation: Mask ventilation without difficulty Laryngoscope Size: Mac and 3 Grade View: Grade I Tube type: Oral Number of attempts: 1 Airway Equipment and Method: Stylet and Oral airway Placement Confirmation: ETT inserted through vocal cords under direct vision, positive ETCO2 and breath sounds checked- equal and bilateral Secured at: 22 cm Tube secured with: Tape Dental Injury: Teeth and Oropharynx as per pre-operative assessment

## 2022-10-12 NOTE — Progress Notes (Signed)
  Echocardiogram 2D Echocardiogram has been performed.  Amanda Mendez 10/12/2022, 9:31 AM

## 2022-10-12 NOTE — Transfer of Care (Signed)
Immediate Anesthesia Transfer of Care Note  Patient: Amanda Mendez  Procedure(s) Performed: CORONARY ARTERY BYPASS GRAFTING (CABG) TIMES THREE USING LEFT INTERNAL MAMMARY ARTERY AND RIGHT LEG GREAT SAPHENOUS VEIN HARVESTED ENDOSCOPICALLY (Chest) TRANSESOPHAGEAL ECHOCARDIOGRAM (TEE)  Patient Location: SICU  Anesthesia Type:General  Level of Consciousness: Patient remains intubated per anesthesia plan  Airway & Oxygen Therapy: Patient remains intubated per anesthesia plan and Patient placed on Ventilator (see vital sign flow sheet for setting)  Post-op Assessment: Report given to RN and Post -op Vital signs reviewed and stable  Post vital signs: Reviewed and stable  Last Vitals:  Vitals Value Taken Time  BP 133/70 .10/12/22 1312      Pulse 80 10/12/22 1312  Resp 14 10/12/22 1312  SpO2 92 10/12/22 1312    Last Pain:  Vitals:   10/12/22 0420  TempSrc: Oral  PainSc:       Patients Stated Pain Goal: 0 (28/36/62 9476)  Complications: No notable events documented.

## 2022-10-12 NOTE — Anesthesia Procedure Notes (Signed)
Central Venous Catheter Insertion Performed by: Santa Lighter, MD, anesthesiologist Start/End11/02/2022 7:00 AM, 10/12/2022 7:07 AM Patient location: Pre-op. Preanesthetic checklist: patient identified, IV checked, site marked, risks and benefits discussed, surgical consent, monitors and equipment checked, pre-op evaluation and timeout performed Position: Trendelenburg Hand hygiene performed  and maximum sterile barriers used  Total catheter length 100. PA cath was placed.Swan type:thermodilution PA Cath depth:48 Procedure performed without using ultrasound guided technique. Attempts: 1 Patient tolerated the procedure well with no immediate complications.

## 2022-10-12 NOTE — Progress Notes (Signed)
Patient was taken to the OR early this a.m. for CABG prior to my evaluation and currently remains in the OR at this time. Appreciate excellent CVTS Care and management. Patient to go to the ICU post-op and TRH will sign off case at this time but please do not hesitate to consult [place] back for medical management if needed.

## 2022-10-12 NOTE — Anesthesia Postprocedure Evaluation (Signed)
Anesthesia Post Note  Patient: Amanda Mendez  Procedure(s) Performed: CORONARY ARTERY BYPASS GRAFTING (CABG) TIMES THREE USING LEFT INTERNAL MAMMARY ARTERY AND RIGHT LEG GREAT SAPHENOUS VEIN HARVESTED ENDOSCOPICALLY (Chest) TRANSESOPHAGEAL ECHOCARDIOGRAM (TEE)     Patient location during evaluation: SICU Anesthesia Type: General Level of consciousness: sedated Pain management: pain level controlled Vital Signs Assessment: post-procedure vital signs reviewed and stable Respiratory status: patient remains intubated per anesthesia plan Cardiovascular status: stable Postop Assessment: no apparent nausea or vomiting Anesthetic complications: no   No notable events documented.  Last Vitals:  Vitals:   10/12/22 1320 10/12/22 1429  BP:    Pulse:    Resp:    Temp:    SpO2: 94% 94%    Last Pain:  Vitals:   10/12/22 1255  TempSrc: Core  PainSc:                  Santa Lighter

## 2022-10-12 NOTE — Discharge Summary (Signed)
[address]       [place] [address]             [phone]    Physician Discharge Summary  Patient ID: Amanda Mendez MRN: [id number] DOB/AGE: [id number] y.o.  Admit date: 10/02/2022 Discharge date: 10/18/2022  Admission Diagnoses:  Patient Active Problem List   Diagnosis Date Noted   S/P CABG x 3 10/12/2022   Unstable angina (HCC)    Abnormal cardiovascular stress test    Elevated troponin 10/02/2022   Chronic kidney disease, stage 3a ([place]) 10/02/2022   Chronic diastolic HF (heart failure) ([place]) 09/26/2021   Fall 09/10/2021   Left leg pain 09/10/2021   Need for 23-polyvalent pneumococcal polysaccharide vaccine 09/25/2016   Need for immunization against influenza 09/25/2016   Gastroesophageal reflux disease without esophagitis 06/25/2016   Dyslipidemia (high LDL; low HDL) 03/26/2016   Vitamin D deficiency 06/16/2015   Obesity (BMI 30-39.9) 03/24/2015   Gastroenteritis, infectious 05/29/2014   Abdominal pain 05/28/2014   HLD (hyperlipidemia) 05/28/2014   CAD in native artery 09/03/2012   Type 2 diabetes mellitus with stage 3a chronic kidney disease, without long-term current use of insulin ([place]) 08/14/2012   Essential (primary) hypertension 08/14/2012   Chest pain 03/88/8280   Acute diastolic heart failure ([place]) 08/13/2012     Discharge Diagnoses:  Patient Active Problem List   Diagnosis Date Noted   S/P CABG x 3 10/12/2022   Unstable angina (HCC)    Abnormal cardiovascular stress test    Elevated troponin 10/02/2022   Chronic kidney disease, stage 3a ([place]) 10/02/2022   Chronic diastolic HF (heart failure) ([place]) 09/26/2021   Fall 09/10/2021   Left leg pain 09/10/2021   Need for 23-polyvalent pneumococcal polysaccharide vaccine 09/25/2016   Need for immunization against influenza 09/25/2016   Gastroesophageal reflux disease without esophagitis 06/25/2016   Dyslipidemia (high LDL; low HDL) 03/26/2016   Vitamin D deficiency 06/16/2015    Obesity (BMI 30-39.9) 03/24/2015   Gastroenteritis, infectious 05/29/2014   Abdominal pain 05/28/2014   HLD (hyperlipidemia) 05/28/2014   CAD in native artery 09/03/2012   Type 2 diabetes mellitus with stage 3a chronic kidney disease, without long-term current use of insulin ([place]) 08/14/2012   Essential (primary) hypertension 08/14/2012   Chest pain 03/49/1791   Acute diastolic heart failure ([place]) 08/13/2012     Discharged Condition: stable  History of Present Illness:  Amanda Mendez is a 66 yo obese female with history of CAD S/p PCI with stent to OM which remains patent, DM uncontrolled, HTN, and CHF.  She presented to Dr. Percival Spanish for routine evaluation on 08/22/2022.  At that visit the patient admitted to experiencing chest discomfort over the past several months.  She is unable to relate this to particular activity.  She is mostly sedentary ambulating with a cane.  She does state it was similar to her chest pain in the past.  She denies associated N/V, but does have some shortness of breath.  It was felt she should undergo PET CT scan to assess for worsening CAD.  This was performed on 10/02/2022  and was suggestive of 3V CAD and the patient experienced angina symptoms.  Due to this it was recommended she report to the nearest emergency room for evaluation.  Workup was negative for NSTEMI/STEMI.Marland Kitchen However it was recommended the patient be admitted to [facility] for further cardiac evaluation.  She underwent catheterization by Dr. Ali Lowe on 10/25 and was found to have multivessel  CAD with a patent stent to her OM.  It was felt coronary bypass grafting would be indicated and Cardiothoracic surgery consultation was requested.    Hospital Course:  The patient was evaluated by Dr. Cyndia Bent at which time she was chest pain free.  She does admit to having some shortness of breath.  She has long standing history of DM currently of which is poorly controlled, A1c obtained is 11.  The  patient states this used to be under better control, however her insurance stopped paying for Jardiance.  Due to this it was recommended she take insulin.  The patient initially refused, but has recently agreed and has been using Lantus for the past month.  Overall she feels she is active, however she can not walk up a flight of stairs without experiencing symptoms.  She does have pain along her left leg.  Workup for this back in 2022 felt this was likely due to Neuropathy.  However, she now has an issue with her "little toe."  She is a former smoker having smoked 1 ppd off and on.  However, she has been smoke free for the past 10 years.  She states her mother and father had heart issues in her 81s.  The patient was explained benefits/risks of the procedure and she was agreeable to proceed.  The patient underwent evaluation by Vascular surgery for a necrotic left 5th toe.  They performed and angiogram which did not show any vascular disease requiring intervention from their part.  It was felt she would require amputation of her left 5th toe by podiatry.  This was performed by Dr. Amalia Hailey on 10/06/2022.  She did well from that surgery.  She remained chest pain free on Heparin drip.  She was taken to the operating room on 10/12/2022.  She underwent CABG x 3 utilizing LIMA to LAD, SVG to PDA, and SVG to Diagonal.  She also underwent endoscopic harvest of greater saphenous vein from her right leg.  She tolerated the procedure without difficulty and was taken to the SICU in stable condition on low-dose IV dopamine.  Vital signs, hemodynamics, respiratory status remained stable.  He was weaned from the ventilator and extubated by 11 PM on the day of surgery.  On postop day 1, she was mildly hypertensive.  Dopamine was weaned and discontinued.  She was started on aspirin, atorvastatin, and metoprolol.  She was diuresed.  Monitoring lines were removed.  Chest tubes were removed on postop day 1.  She was mobilized on the first  postoperative day and tolerated this well.Mild hypertension persisted, metoprolol was increased to 25 mg twice daily her Hyzaar was resumed.  Her diabetes was initially managed with an insulin drip and later transitioned to Lantus scale insulin.  After her oral intake improved, she was started back on Amaryl on postop day 2 and managed with before meals and at bedtime sliding scale insulin. On POD-3, orders were given for transition to [place].  Diuresis continued for expected volume excess. She progressed to independent mobility and had return of normal bowel and bladder function. She was evaluated by PT and OT and was felt to be sufficiently mobile for planned discharge to home. Her incisions were healing with no sign of complication.   Consults: None  Significant Diagnostic Studies: angiography:     1st Mrg lesion is 10% stenosed.   1.  Severe multivessel disease consisting of chronic total occlusion of right coronary artery (which has a high anterior takeoff), proximal  LAD, and first diagonal disease. 2.  Patent first obtuse marginal stent. 3.  LVEDP of 26 mmHg.   Recommendation: Cardiothoracic surgery consultation; results discussed with Dr. Harl Bowie.   ABDOMINAL AORTOGRAM W/LOWER EXTREMITY   OPERATIVE FINDINGS:  Terminal aorta and iliac arteries: Ulcerated plaque in infrarenal aorta No flow limiting stenosis   Left lower extremity: Common femoral artery: patent without hemodynamically significant stenosis.  Profunda femoris artery: patent without hemodynamically significant stenosis.   Superficial femoral artery: patent without hemodynamically significant stenosis.  Popliteal artery: patent without hemodynamically significant stenosis.  Anterior tibial artery: disease about the origin, but patent without hemodynamically significant stenosis.  Tibioperoneal trunk: disease about the origin, but patent without hemodynamically significant stenosis.  Peroneal artery: disease  about the origin, but patent without hemodynamically significant stenosis.  Posterior tibial artery: disease about the origin, but patent without hemodynamically significant stenosis.  Pedal circulation: fills via PT / peroneal  Treatments: surgery:   OPERATIVE REPORT Patient name: Amanda Mendez MRN: [id number] DOB: [birth year]   DOS: 10/06/22   Preop Dx: Gangrene left fifth toe Postop Dx: same   Procedure:  1.  Left fifth toe amputation   Surgeon: Edrick Kins DPM   Discharge Exam: Blood pressure (!) 156/75, pulse 78, temperature 97.6 F (36.4 C), temperature source Oral, resp. rate 20, height _0  (1.549 m), weight 82.5 kg, SpO2 95 %.  General appearance: alert and cooperative Neurologic: intact Heart: regular rate and rhythm Lungs: clear to auscultation bilaterally Extremities: Minimal LE edema Wound: the sternotomy incision and RLE EVH incision are intact and dry.   Discharge Medications:  The patient has been discharged on:   1.Beta Blocker:  Yes [  x ]                              No   [   ]                              If No, reason:  2.Ace Inhibitor/ARB: Yes [ x  ]                                     No  [    ]                                     If No, reason:  3.Statin:   Yes [ x  ]                  No  [   ]                  If No, reason:  4.Ecasa:  Yes  [  x ]                  No   [   ]                  If No, reason:  Patient had ACS upon admission: No  Plavix/P2Y12 inhibitor: Yes [   ]  No  [ x  ]     Discharge Instructions     Amb Referral to Cardiac Rehabilitation   Complete by: As directed    Diagnosis: CABG   CABG X ___: 3   After initial evaluation and assessments completed: Virtual Based Care may be provided alone or in conjunction with Phase 2 Cardiac Rehab based on patient barriers.: Yes   Intensive Cardiac Rehabilitation (ICR) [place] location only OR Traditional Cardiac Rehabilitation (TCR)  *If criteria for ICR are not met will enroll in TCR [facility] only): Yes      Allergies as of 10/18/2022       Reactions   Dapagliflozin Itching   Codeine Nausea And Vomiting   But has tolerated morphine        Medication List     TAKE these medications    acetaminophen 500 MG tablet Commonly known as: TYLENOL Take 500 mg by mouth every 4 (four) hours as needed for moderate pain or mild pain.   aspirin EC 325 MG tablet Take 1 tablet (325 mg total) by mouth daily. What changed:  medication strength how much to take additional instructions   DULoxetine 30 MG capsule Commonly known as: Cymbalta Take 1 capsule (30 mg total) by mouth daily.   empagliflozin 10 MG Tabs tablet Commonly known as: JARDIANCE Take 1 tablet (10 mg total) by mouth daily.   ezetimibe 10 MG tablet Commonly known as: ZETIA Take 1 tablet (10 mg total) by mouth daily.   glimepiride 2 MG tablet Commonly known as: AMARYL Take 2 mg by mouth 3 (three) times daily.   Lantus SoloStar 100 UNIT/ML Solostar Pen Generic drug: insulin glargine Inject into the skin.   losartan-hydrochlorothiazide 100-25 MG tablet Commonly known as: HYZAAR Take 1 tablet by mouth daily.   metoprolol tartrate 50 MG tablet Commonly known as: LOPRESSOR Take 50 mg by mouth 2 (two) times daily.   omeprazole 20 MG capsule Commonly known as: PRILOSEC Take 20 mg by mouth daily.   rosuvastatin 20 MG tablet Commonly known as: CRESTOR Take 1 tablet (20 mg total) by mouth daily.   traMADol 50 MG tablet Commonly known as: ULTRAM Take 1 tablet (50 mg total) by mouth every 6 (six) hours as needed for up to 5 days for moderate pain.   VITAMIN B6 PO Take 1 capsule by mouth daily.   Vitamin D3 1.25 MG (50000 UT) Tabs Take 5,000 Units by mouth.        Follow-up Information     [facility] [place]. Go on 10/22/2022.   Specialty: Cardiothoracic Surgery Why: Your appt for suture removal is at  11:30am Contact information: [place], [place]        Gaye Pollack, MD. Go on 11/14/2022.   Specialty: Cardiothoracic Surgery Why: Your appointment is at 11:30. [facility] 1 hour before the appointment with Dr. Cyndia Bent for a chest X-ray. Contact information: [address] [place] [address] [phone]         Minus Breeding, MD. Go on 11/09/2022.   Specialty: Cardiology Why: Your appointment is at 10:20am. Contact information: [address] [place] [address] [phone]                 Signed:  Antony Odea, PA-C  10/18/2022, 8:31 AM

## 2022-10-12 NOTE — Interval H&P Note (Signed)
History and Physical Interval Note:  10/12/2022 6:25 AM  Amanda Mendez  has presented today for surgery, with the diagnosis of CAD.  The various methods of treatment have been discussed with the patient and family. After consideration of risks, benefits and other options for treatment, the patient has consented to  Procedure(s): CORONARY ARTERY BYPASS GRAFTING (CABG) (N/A) TRANSESOPHAGEAL ECHOCARDIOGRAM (TEE) (N/A) as a surgical intervention.  The patient's history has been reviewed, patient examined, no change in status, stable for surgery.  I have reviewed the patient's chart and labs.  Questions were answered to the patient's satisfaction.     Gaye Pollack

## 2022-10-12 NOTE — Discharge Instructions (Signed)

## 2022-10-12 NOTE — Brief Op Note (Signed)
10/02/2022 - 10/12/2022  12:16 PM  PATIENT:  Amanda Mendez  66 y.o. female  PRE-OPERATIVE DIAGNOSIS:  CAD  POST-OPERATIVE DIAGNOSIS:  * No post-op diagnosis entered *  PROCEDURE:  Procedure(s):  CORONARY ARTERY BYPASS GRAFTING x 3 -LIMA to LAD -SVG to PDA -SVG to DIAGONAL  TRANSESOPHAGEAL ECHOCARDIOGRAM (TEE) (N/A)  ENDOSCOPIC HARVEST GREATER SAPHENOUS VEIN -Right leg Vein harvest time: 45 min Vein prep time: 15 min  SURGEON:  Surgeon(s) and Role:    * Bartle, Payton Doughty, MD - Primary  PHYSICIAN ASSISTANT: Aloha Gell PA- C, Braiden Presutti PA-C  ASSISTANTS: Darlina Rumpf RNFA   ANESTHESIA:   general  EBL:  150 mL   BLOOD ADMINISTERED:1 unit PRBC and CELLSAVER  DRAINS:  Left Pleural Chest Tube, Mediastinal Chest Drain    LOCAL MEDICATIONS USED:  NONE  SPECIMEN:  No Specimen  DISPOSITION OF SPECIMEN:  N/A  COUNTS:  YES  TOURNIQUET:  * No tourniquets in log *  DICTATION: .Dragon Dictation  PLAN OF CARE: Admit to inpatient   PATIENT DISPOSITION:  ICU - intubated and hemodynamically stable.   Delay start of Pharmacological VTE agent (>24hrs) due to surgical blood loss or risk of bleeding: yes

## 2022-10-12 NOTE — Procedures (Signed)
Extubation Procedure Note  Patient Details:   Name: Amanda Mendez DOB: June 18, 1956 MRN: [id number]   Airway Documentation:  Airway 8 mm (Active)  Secured at (cm) 22 cm 10/12/22 2221  Measured From Lips 10/12/22 2221  Secured Location Right 10/12/22 2221  Secured By Pink Tape 10/12/22 2221  Prone position No 10/12/22 1557  Cuff Pressure (cm H2O) Green OR 18-26 CmH2O 10/12/22 1928  Site Condition Dry 10/12/22 2221   Vent end date: (not recorded) Vent end time: (not recorded)   Evaluation  O2 sats: stable throughout Complications: No apparent complications Patient did tolerate procedure well. Bilateral Breath Sounds: Clear, Diminished   Yes  Graciella Freer 10/12/2022, 10:56 PM   Pt extubated per rapid wean protocol, pt able to perform VC of 558m, and NIF of -30 with great effort. Pt had positive cuff leak.

## 2022-10-12 NOTE — Anesthesia Procedure Notes (Signed)
Arterial Line Insertion Start/End11/02/2022 7:05 AM, 10/12/2022 7:10 AM Performed by: Bryson Corona, CRNA, CRNA  Patient location: Pre-op. Preanesthetic checklist: patient identified, IV checked, site marked, risks and benefits discussed, surgical consent, monitors and equipment checked, pre-op evaluation, timeout performed and anesthesia consent Lidocaine 1% used for infiltration Left, radial was placed Catheter size: 20 G Hand hygiene performed  and maximum sterile barriers used   Attempts: 1 Procedure performed without using ultrasound guided technique. Following insertion, dressing applied and Biopatch. Post procedure assessment: normal and unchanged  Patient tolerated the procedure well with no immediate complications.

## 2022-10-12 NOTE — Op Note (Signed)
CARDIOVASCULAR SURGERY OPERATIVE NOTE  10/12/2022  Surgeon:  Gaye Pollack, MD  First Assistant: Wynelle Beckmann PA-C and Ellwood Handler PA-C :   An experienced assistant was required given the complexity of this surgery and the standard of surgical care. The assistant was needed for endoscopic vein harvesting, exposure, dissection, suctioning, retraction of delicate tissues and sutures, instrument exchange and for overall help during this procedure.   Preoperative Diagnosis:  Severe multi-vessel coronary artery disease   Postoperative Diagnosis:  Same   Procedure:  Median Sternotomy Extracorporeal circulation 3.   Coronary artery bypass grafting x 3  Left internal mammary artery graft to the LAD SVG to diagonal SVG to RCA  4.   Endoscopic vein harvest from the right leg   Anesthesia:  General Endotracheal   Clinical History/Surgical Indication:  This 66 year old woman presents with recurrent angina at low level of exertion as well as tiredness and exertional fatigue. PET scan shows multi-vessel ischemia. Cath films reviewed and show chronically occluded proximal RCA with LCX to distal RCA collaterals. There is 80% proximal LAD and 90% ostial diagonal stenoses and they are both graftable vessels. The LCX is patent at old stent site. LVEF normal with no significant valvular disease. I agree that CABG is the best long term treatment for this diabetic woman with multivessel disease. She also has poorly controlled DM, stage 3 CKD, and PVD with a gangrenous left 5th toe that was treated with amputation by Podiatry.  Preparation:  The patient was seen in the preoperative holding area and the correct patient, correct operation were confirmed with the patient after reviewing the medical record and catheterization. The consent was signed by me. Preoperative antibiotics were given. A pulmonary  arterial line and radial arterial line were placed by the anesthesia team. The patient was taken back to the operating room and positioned supine on the operating room table. After being placed under general endotracheal anesthesia by the anesthesia team a foley catheter was placed. The neck, chest, abdomen, and both legs were prepped with betadine soap and solution and draped in the usual sterile manner. A surgical time-out was taken and the correct patient and operative procedure were confirmed with the nursing and anesthesia staff.   Cardiopulmonary Bypass:  A median sternotomy was performed. The pericardium was opened in the midline. Right ventricular function appeared normal. The ascending aorta was of normal size and had no palpable plaque. There were no contraindications to aortic cannulation or cross-clamping. The patient was fully systemically heparinized and the ACT was maintained > 400 sec. The proximal aortic arch was cannulated with a 20 F aortic cannula for arterial inflow. Venous cannulation was performed via the right atrial appendage using a two-staged venous cannula. An antegrade cardioplegia/vent cannula was inserted into the mid-ascending aorta. Aortic occlusion was performed with a single cross-clamp. Systemic cooling to 32 degrees Centigrade and topical cooling of the heart with iced saline were used. Hyperkalemic antegrade cold blood cardioplegia was used to induce diastolic arrest and was then given at about 20 minute intervals throughout the period of arrest to maintain myocardial temperature at or below 10 degrees centigrade. A temperature probe was inserted into the interventricular septum and an insulating pad was placed in the pericardium.   Left internal mammary artery harvest:  The left side of the sternum was retracted using the Rultract retractor. The left internal mammary artery was harvested as a pedicle graft. All side branches were clipped. It was a medium-sized vessel  of good  quality with excellent blood flow. It was ligated distally and divided. It was sprayed with topical papaverine solution to prevent vasospasm.   Endoscopic vein harvest:  The right greater saphenous vein was harvested endoscopically through a 2 cm incision medial to the right knee. It was harvested from the upper thigh to below the knee. It was a medium-sized vein of good quality. The side branches were all ligated with 4-0 silk ties.    Coronary arteries:  The coronary arteries were examined.  LAD:  medium caliber vessel with no disease distally. The diagonal was a medium caliber vessel with no distal disease. LCX:  no stenosis on cath RCA:  medium caliber vessel with no disease distally.   Grafts:  LIMA to the LAD: 2.0 mm. It was sewn end to side using 8-0 prolene continuous suture. SVG to diagonal:  1.5 mm. It was sewn end to side using 7-0 prolene continuous suture. SVG to RCA:  1.6 mm. It was sewn end to side using 7-0 prolene continuous suture.  The proximal vein graft anastomoses were performed to the mid-ascending aorta using continuous 6-0 prolene suture. Graft markers were placed around the proximal anastomoses.   Completion:  The patient was rewarmed to 37 degrees Centigrade. The clamp was removed from the LIMA pedicle and there was rapid warming of the septum and return of ventricular fibrillation. The crossclamp was removed with a time of  73 minutes. There was spontaneous return of sinus rhythm. The distal and proximal anastomoses were checked for hemostasis. The position of the grafts was satisfactory. Two temporary epicardial pacing wires were placed on the right atrium and two on the right ventricle. The patient was weaned from CPB without difficulty on low dose dopamine that was run at 2 mcg during the pump run for renal perfusion. CPB time was 90 minutes. Cardiac output was 5 LPM. TEE Showed normal LV systolic function.  Heparin was fully reversed with protamine  and the aortic and venous cannulas removed. Hemostasis was achieved. Mediastinal and left pleural drainage tubes were placed. The sternum was closed with  #6 stainless steel wires. The fascia was closed with continuous # 1 vicryl suture. The subcutaneous tissue was closed with 2-0 vicryl continuous suture. The skin was closed with 3-0 vicryl subcuticular suture. All sponge, needle, and instrument counts were reported correct at the end of the case. Dry sterile dressings were placed over the incisions and around the chest tubes which were connected to pleurevac suction. The patient was then transported to the surgical intensive care unit in stable condition.

## 2022-10-12 NOTE — Anesthesia Procedure Notes (Signed)
Central Venous Catheter Insertion Performed by: Santa Lighter, MD, anesthesiologist Start/End11/02/2022 6:50 AM, 10/12/2022 7:00 AM Patient location: Pre-op. Preanesthetic checklist: patient identified, IV checked, site marked, risks and benefits discussed, surgical consent, monitors and equipment checked, pre-op evaluation, timeout performed and anesthesia consent Position: Trendelenburg Lidocaine 1% used for infiltration and patient sedated Hand hygiene performed , maximum sterile barriers used  and Seldinger technique used Catheter size: 9 Fr Central line was placed.MAC introducer Procedure performed using ultrasound guided technique. Ultrasound Notes:anatomy identified, needle tip was noted to be adjacent to the nerve/plexus identified, no ultrasound evidence of intravascular and/or intraneural injection and image(s) printed for medical record Attempts: 1 Following insertion, line sutured, dressing applied and Biopatch. Post procedure assessment: free fluid flow, blood return through all ports and no air  Patient tolerated the procedure well with no immediate complications.

## 2022-10-12 NOTE — Hospital Course (Signed)
History of Present Illness:  Amanda Mendez is a 66 yo obese female with history of CAD S/p PCI with stent to OM which remains patent, DM uncontrolled, HTN, and CHF.  She presented to Dr. Percival Spanish for routine evaluation on 08/22/2022.  At that visit the patient admitted to experiencing chest discomfort over the past several months.  She is unable to relate this to particular activity.  She is mostly sedentary ambulating with a cane.  She does state it was similar to her chest pain in the past.  She denies associated N/V, but does have some shortness of breath.  It was felt she should undergo PET CT scan to assess for worsening CAD.  This was performed on 10/02/2022  and was suggestive of 3V CAD and the patient experienced angina symptoms.  Due to this it was recommended she report to the nearest emergency room for evaluation.  Workup was negative for NSTEMI/STEMI.Marland Kitchen However it was recommended the patient be admitted to [facility] for further cardiac evaluation.  She underwent catheterization by Dr. Ali Lowe on 10/25 and was found to have multivessel CAD with a patent stent to her OM.  It was felt coronary bypass grafting would be indicated and Cardiothoracic surgery consultation was requested.    Hospital Course:  The patient was evaluated by Dr. Cyndia Bent at which time she was chest pain free.  She does admit to having some shortness of breath.  She has long standing history of DM currently of which is poorly controlled, A1c obtained is 11.  The patient states this used to be under better control, however her insurance stopped paying for Jardiance.  Due to this it was recommended she take insulin.  The patient initially refused, but has recently agreed and has been using Lantus for the past month.  Overall she feels she is active, however she can not walk up a flight of stairs without experiencing symptoms.  She does have pain along her left leg.  Workup for this back in 2022 felt this was likely due to  Neuropathy.  However, she now has an issue with her "little toe."  She is a former smoker having smoked 1 ppd off and on.  However, she has been smoke free for the past 10 years.  She states her mother and father had heart issues in her 67s.  The patient was explained benefits/risks of the procedure and she was agreeable to proceed.  The patient underwent evaluation by Vascular surgery for a necrotic left 5th toe.  They performed and angiogram which did not show any vascular disease requiring intervention from their part.  It was felt she would require amputation of her left 5th toe by podiatry.  This was performed by Dr. Amalia Hailey on 10/06/2022.  She did well from that surgery.  She remained chest pain free on Heparin drip.  She was taken to the operating room on 10/12/2022.  She underwent CABG x 3 utilizing LIMA to LAD, SVG to PDA, and SVG to Diagonal.  She also underwent endoscopic harvest of greater saphenous vein from her right leg.  She tolerated the procedure without difficulty and was taken to the SICU in stable condition.

## 2022-10-12 NOTE — Progress Notes (Signed)
Rapid wean started at this time.

## 2022-10-12 NOTE — Progress Notes (Signed)
  Echocardiogram 2D Echocardiogram has been performed.  Amanda Mendez 10/12/2022, 9:27 AM

## 2022-10-12 NOTE — Progress Notes (Signed)
      [address]       [place] [address]             [phone]      S/p CABG x 3  Intubated, sedated  BP 112/60   Pulse 80   Temp 98.4 F (36.9 C)   Resp 18   Ht '5\' 1"'$  (1.549 m)   Wt 82 kg   SpO2 99%   BMI 34.14 kg/m  27/13 Ci 1.7 On dopamine at 2   Intake/Output Summary (Last 24 hours) at 10/12/2022 1813 Last data filed at 10/12/2022 1700 Gross per 24 hour  Intake 4401.05 ml  Output 1225 ml  Net 3176.05 ml   Minimal CT output Hct 34 K 4.4  Doing well early postop  Remo Lipps C. Roxan Hockey, MD [facility] [phone]

## 2022-10-12 NOTE — Progress Notes (Signed)
Patient arrived from OR with sats 89-90 on SIMV vent settings.  RT spoke with Dr. Cyndia Bent and did a lung recruitment maneuver for 2 minutes.  PC 25, Peep 5, 100%, R 10, iT 3.00.  Sats improved to 99% at 12:58. Patient placed back on SIMV/PRVC/PS.

## 2022-10-13 ENCOUNTER — Inpatient Hospital Stay (HOSPITAL_COMMUNITY): Payer: Medicare Other

## 2022-10-13 LAB — CBC
HCT: 31.5 % — ABNORMAL LOW (ref 36.0–46.0)
HCT: 31.8 % — ABNORMAL LOW (ref 36.0–46.0)
Hemoglobin: 10.6 g/dL — ABNORMAL LOW (ref 12.0–15.0)
Hemoglobin: 10.6 g/dL — ABNORMAL LOW (ref 12.0–15.0)
MCH: 31 pg (ref 26.0–34.0)
MCH: 31.2 pg (ref 26.0–34.0)
MCHC: 33.7 g/dL (ref 30.0–36.0)
MCHC: 33.3 g/dL (ref 30.0–36.0)
MCV: 92.1 fL (ref 80.0–100.0)
MCV: 93.5 fL (ref 80.0–100.0)
Platelets: 216 10*3/uL (ref 150–400)
Platelets: 200 10*3/uL (ref 150–400)
RBC: 3.42 MIL/uL — ABNORMAL LOW (ref 3.87–5.11)
RBC: 3.4 MIL/uL — ABNORMAL LOW (ref 3.87–5.11)
RDW: 14.3 % (ref 11.5–15.5)
RDW: 14.5 % (ref 11.5–15.5)
WBC: 15.8 10*3/uL — ABNORMAL HIGH (ref 4.0–10.5)
WBC: 15.6 10*3/uL — ABNORMAL HIGH (ref 4.0–10.5)
nRBC: 0 % (ref 0.0–0.2)
nRBC: 0 % (ref 0.0–0.2)

## 2022-10-13 LAB — POCT I-STAT 7, (LYTES, BLD GAS, ICA,H+H)
Acid-base deficit: 3 mmol/L — ABNORMAL HIGH (ref 0.0–2.0)
Acid-base deficit: 3 mmol/L — ABNORMAL HIGH (ref 0.0–2.0)
Bicarbonate: 22.3 mmol/L (ref 20.0–28.0)
Bicarbonate: 22.6 mmol/L (ref 20.0–28.0)
Calcium, Ion: 1.15 mmol/L (ref 1.15–1.40)
Calcium, Ion: 1.17 mmol/L (ref 1.15–1.40)
HCT: 31 % — ABNORMAL LOW (ref 36.0–46.0)
HCT: 32 % — ABNORMAL LOW (ref 36.0–46.0)
Hemoglobin: 10.5 g/dL — ABNORMAL LOW (ref 12.0–15.0)
Hemoglobin: 10.9 g/dL — ABNORMAL LOW (ref 12.0–15.0)
O2 Saturation: 91 %
O2 Saturation: 94 %
Patient temperature: 37.2
Patient temperature: 37.2
Potassium: 4.2 mmol/L (ref 3.5–5.1)
Potassium: 4.6 mmol/L (ref 3.5–5.1)
Sodium: 138 mmol/L (ref 135–145)
Sodium: 138 mmol/L (ref 135–145)
TCO2: 24 mmol/L (ref 22–32)
TCO2: 24 mmol/L (ref 22–32)
pCO2 arterial: 42.3 mmHg (ref 32–48)
pCO2 arterial: 42.4 mmHg (ref 32–48)
pH, Arterial: 7.331 — ABNORMAL LOW (ref 7.35–7.45)
pH, Arterial: 7.336 — ABNORMAL LOW (ref 7.35–7.45)
pO2, Arterial: 65 mmHg — ABNORMAL LOW (ref 83–108)
pO2, Arterial: 77 mmHg — ABNORMAL LOW (ref 83–108)

## 2022-10-13 LAB — BASIC METABOLIC PANEL
Anion gap: 7 (ref 5–15)
Anion gap: 12 (ref 5–15)
BUN: 22 mg/dL (ref 8–23)
BUN: 25 mg/dL — ABNORMAL HIGH (ref 8–23)
CO2: 24 mmol/L (ref 22–32)
CO2: 24 mmol/L (ref 22–32)
Calcium: 8.2 mg/dL — ABNORMAL LOW (ref 8.9–10.3)
Calcium: 8.5 mg/dL — ABNORMAL LOW (ref 8.9–10.3)
Chloride: 108 mmol/L (ref 98–111)
Chloride: 102 mmol/L (ref 98–111)
Creatinine, Ser: 1.32 mg/dL — ABNORMAL HIGH (ref 0.44–1.00)
Creatinine, Ser: 1.61 mg/dL — ABNORMAL HIGH (ref 0.44–1.00)
GFR, Estimated: 44 mL/min — ABNORMAL LOW (ref >60)
GFR, Estimated: 35 mL/min — ABNORMAL LOW (ref >60)
Glucose, Bld: 118 mg/dL — ABNORMAL HIGH (ref 70–99)
Glucose, Bld: 263 mg/dL — ABNORMAL HIGH (ref 70–99)
Potassium: 4.1 mmol/L (ref 3.5–5.1)
Potassium: 4.3 mmol/L (ref 3.5–5.1)
Sodium: 139 mmol/L (ref 135–145)
Sodium: 138 mmol/L (ref 135–145)

## 2022-10-13 LAB — TYPE AND SCREEN
ABO/RH(D): A POS
Antibody Screen: NEGATIVE
Unit division: 0
Unit division: 0

## 2022-10-13 LAB — BPAM RBC
Blood Product Expiration Date: 202311222359
Blood Product Expiration Date: 202311232359
ISSUE DATE / TIME: 202311031037
ISSUE DATE / TIME: 202311031037
Unit Type and Rh: 6200
Unit Type and Rh: 6200

## 2022-10-13 LAB — GLUCOSE, CAPILLARY
Glucose-Capillary: 109 mg/dL — ABNORMAL HIGH (ref 70–99)
Glucose-Capillary: 114 mg/dL — ABNORMAL HIGH (ref 70–99)
Glucose-Capillary: 122 mg/dL — ABNORMAL HIGH (ref 70–99)
Glucose-Capillary: 124 mg/dL — ABNORMAL HIGH (ref 70–99)
Glucose-Capillary: 133 mg/dL — ABNORMAL HIGH (ref 70–99)
Glucose-Capillary: 149 mg/dL — ABNORMAL HIGH (ref 70–99)
Glucose-Capillary: 150 mg/dL — ABNORMAL HIGH (ref 70–99)
Glucose-Capillary: 246 mg/dL — ABNORMAL HIGH (ref 70–99)
Glucose-Capillary: 271 mg/dL — ABNORMAL HIGH (ref 70–99)
Glucose-Capillary: 95 mg/dL (ref 70–99)

## 2022-10-13 LAB — MAGNESIUM
Magnesium: 2.5 mg/dL — ABNORMAL HIGH (ref 1.7–2.4)
Magnesium: 2.4 mg/dL (ref 1.7–2.4)

## 2022-10-13 MED ORDER — METOCLOPRAMIDE HCL 5 MG/ML IJ SOLN
10.0000 mg | Freq: Four times a day (QID) | INTRAMUSCULAR | Status: AC
  Administered 2022-10-13 (×4): 10 mg via INTRAVENOUS
  Filled 2022-10-13 (×4): qty 2

## 2022-10-13 MED ORDER — FUROSEMIDE 10 MG/ML IJ SOLN
20.0000 mg | Freq: Two times a day (BID) | INTRAMUSCULAR | Status: AC
  Administered 2022-10-13 – 2022-10-14 (×4): 20 mg via INTRAVENOUS
  Filled 2022-10-13 (×4): qty 2

## 2022-10-13 MED ORDER — INSULIN ASPART 100 UNIT/ML IJ SOLN
0.0000 [IU] | INTRAMUSCULAR | Status: DC
  Administered 2022-10-13: 8 [IU] via SUBCUTANEOUS
  Administered 2022-10-13: 12 [IU] via SUBCUTANEOUS
  Administered 2022-10-13: 2 [IU] via SUBCUTANEOUS
  Administered 2022-10-14: 4 [IU] via SUBCUTANEOUS

## 2022-10-13 MED ORDER — ENOXAPARIN SODIUM 40 MG/0.4ML IJ SOSY
40.0000 mg | PREFILLED_SYRINGE | Freq: Every day | INTRAMUSCULAR | Status: DC
  Administered 2022-10-13 – 2022-10-17 (×5): 40 mg via SUBCUTANEOUS
  Filled 2022-10-13 (×5): qty 0.4

## 2022-10-13 MED ORDER — PHENOL 1.4 % MT LIQD
1.0000 | OROMUCOSAL | Status: DC | PRN
  Administered 2022-10-14: 1 via OROMUCOSAL
  Filled 2022-10-13: qty 177

## 2022-10-13 MED ORDER — INSULIN DETEMIR 100 UNIT/ML ~~LOC~~ SOLN
35.0000 [IU] | Freq: Two times a day (BID) | SUBCUTANEOUS | Status: DC
  Administered 2022-10-13 – 2022-10-14 (×3): 35 [IU] via SUBCUTANEOUS
  Filled 2022-10-13 (×4): qty 0.35

## 2022-10-13 NOTE — Progress Notes (Signed)
      [address]       [place] [address]             [phone]      POD # 1 CABG  Did well with ambulation today  BP (!) 150/63   Pulse 87   Temp 98.2 F (36.8 C) (Oral)   Resp (!) 22   Ht '5\' 1"'$  (1.549 m)   Wt 87.6 kg   SpO2 95%   BMI 36.49 kg/m    Intake/Output Summary (Last 24 hours) at 10/13/2022 1757 Last data filed at 10/13/2022 1700 Gross per 24 hour  Intake 2350.81 ml  Output 2106 ml  Net 244.81 ml   Hct 32  CBG elevated this afternoon, better this evening  Remo Lipps C. Roxan Hockey, MD [facility] [phone]

## 2022-10-13 NOTE — Progress Notes (Signed)
1 Day Post-Op Procedure(s) (LRB): CORONARY ARTERY BYPASS GRAFTING (CABG) TIMES THREE USING LEFT INTERNAL MAMMARY ARTERY AND RIGHT LEG GREAT SAPHENOUS VEIN HARVESTED ENDOSCOPICALLY (N/A) TRANSESOPHAGEAL ECHOCARDIOGRAM (TEE) (N/A) Subjective: C/o incisional pain and nausea  Objective: Vital signs in last 24 hours: Temp:  [98.1 F (36.7 C)-99.5 F (37.5 C)] 99.3 F (37.4 C) (11/04 0700) Pulse Rate:  [77-92] 88 (11/04 0700) Cardiac Rhythm: Normal sinus rhythm (11/04 0600) Resp:  [0-24] 19 (11/04 0700) BP: (88-125)/(60-80) 122/73 (11/04 0630) SpO2:  [89 %-100 %] 89 % (11/04 0700) Arterial Line BP: (100-161)/(50-84) 148/64 (11/04 0700) FiO2 (%):  [40 %-60 %] 40 % (11/03 2221) Weight:  [87.6 kg] 87.6 kg (11/04 0500)  Hemodynamic parameters for last 24 hours: PAP: (22-42)/(13-29) 35/20 CO:  [3.4 L/min-5.4 L/min] 3.8 L/min CI:  [1.9 L/min/m2-3 L/min/m2] 2.1 L/min/m2  Intake/Output from previous day: 11/03 0701 - 11/04 0700 In: 5593.9 [P.O.:25; I.V.:3931.7; Blood:400; NG/GT:60; IV Piggyback:1177.2] Out: 1981 [Urine:1530; Emesis/NG output:1; Blood:150; Chest Tube:300] Intake/Output this shift: No intake/output data recorded.  General appearance: alert, cooperative, and no distress Neurologic: intact Heart: regular rate and rhythm Lungs: diminished breath sounds bibasilar Abdomen: normal findings: soft, non-tender  Lab Results: Recent Labs    10/12/22 1834 10/12/22 2247 10/12/22 2346 10/13/22 0257  WBC 15.2*  --   --  15.8*  HGB 11.6*   < > 10.5* 10.6*  HCT 33.1*   < > 31.0* 31.5*  PLT 215  --   --  216   < > = values in this interval not displayed.   BMET:  Recent Labs    10/12/22 1834 10/12/22 2247 10/12/22 2346 10/13/22 0257  NA 138   < > 138 139  K 4.5   < > 4.2 4.1  CL 107  --   --  108  CO2 23  --   --  24  GLUCOSE 168*  --   --  118*  BUN 24*  --   --  22  CREATININE 1.18*  --   --  1.32*  CALCIUM 7.9*  --   --  8.2*   < > = values in this interval not  displayed.    PT/INR:  Recent Labs    10/12/22 1305  LABPROT 14.2  INR 1.1   ABG    Component Value Date/Time   PHART 7.336 (L) 10/12/2022 2346   HCO3 22.6 10/12/2022 2346   TCO2 24 10/12/2022 2346   ACIDBASEDEF 3.0 (H) 10/12/2022 2346   O2SAT 94 10/12/2022 2346   CBG (last 3)  Recent Labs    10/13/22 0107 10/13/22 0201 10/13/22 0307  GLUCAP 122* 133* 114*    Assessment/Plan: S/P Procedure(s) (LRB): CORONARY ARTERY BYPASS GRAFTING (CABG) TIMES THREE USING LEFT INTERNAL MAMMARY ARTERY AND RIGHT LEG GREAT SAPHENOUS VEIN HARVESTED ENDOSCOPICALLY (N/A) TRANSESOPHAGEAL ECHOCARDIOGRAM (TEE) (N/A) POD # 1 NEURO- intact CV- in Sr with good index,   Hypertensive- wean dopamine  ASA, statin, beta blocker RESP- IS for atelectasis RENAL- creatinine 1.32, baseline ~ 1.5  Diurese ENDO- CBG controlled with insulin drip  Transition to levemir + SSI Gi- advance diet as tolerated Anemia secondary to ABL- mild, follow SCD + enoxaparin for DVT prophylaxis Minimal CT output - dc Cardiac rehab   LOS: 11 days    Melrose Nakayama 10/13/2022

## 2022-10-14 ENCOUNTER — Inpatient Hospital Stay (HOSPITAL_COMMUNITY): Payer: Medicare Other

## 2022-10-14 LAB — BASIC METABOLIC PANEL
Anion gap: 8 (ref 5–15)
BUN: 24 mg/dL — ABNORMAL HIGH (ref 8–23)
CO2: 26 mmol/L (ref 22–32)
Calcium: 8.5 mg/dL — ABNORMAL LOW (ref 8.9–10.3)
Chloride: 105 mmol/L (ref 98–111)
Creatinine, Ser: 1.56 mg/dL — ABNORMAL HIGH (ref 0.44–1.00)
GFR, Estimated: 36 mL/min — ABNORMAL LOW (ref >60)
Glucose, Bld: 71 mg/dL (ref 70–99)
Potassium: 3.7 mmol/L (ref 3.5–5.1)
Sodium: 139 mmol/L (ref 135–145)

## 2022-10-14 LAB — GLUCOSE, CAPILLARY
Glucose-Capillary: 68 mg/dL — ABNORMAL LOW (ref 70–99)
Glucose-Capillary: 107 mg/dL — ABNORMAL HIGH (ref 70–99)
Glucose-Capillary: 189 mg/dL — ABNORMAL HIGH (ref 70–99)
Glucose-Capillary: 248 mg/dL — ABNORMAL HIGH (ref 70–99)
Glucose-Capillary: 130 mg/dL — ABNORMAL HIGH (ref 70–99)
Glucose-Capillary: 158 mg/dL — ABNORMAL HIGH (ref 70–99)

## 2022-10-14 LAB — CBC
HCT: 32.2 % — ABNORMAL LOW (ref 36.0–46.0)
Hemoglobin: 10.4 g/dL — ABNORMAL LOW (ref 12.0–15.0)
MCH: 31.1 pg (ref 26.0–34.0)
MCHC: 32.3 g/dL (ref 30.0–36.0)
MCV: 96.4 fL (ref 80.0–100.0)
Platelets: 213 10*3/uL (ref 150–400)
RBC: 3.34 MIL/uL — ABNORMAL LOW (ref 3.87–5.11)
RDW: 14.3 % (ref 11.5–15.5)
WBC: 14.9 10*3/uL — ABNORMAL HIGH (ref 4.0–10.5)
nRBC: 0 % (ref 0.0–0.2)

## 2022-10-14 MED ORDER — INSULIN ASPART 100 UNIT/ML IJ SOLN
4.0000 [IU] | Freq: Three times a day (TID) | INTRAMUSCULAR | Status: DC
  Administered 2022-10-15 – 2022-10-16 (×5): 4 [IU] via SUBCUTANEOUS

## 2022-10-14 MED ORDER — METOPROLOL TARTRATE 25 MG PO TABS
25.0000 mg | ORAL_TABLET | Freq: Two times a day (BID) | ORAL | Status: DC
  Administered 2022-10-14: 25 mg via ORAL
  Filled 2022-10-14: qty 1

## 2022-10-14 MED ORDER — HYDROCHLOROTHIAZIDE 25 MG PO TABS
25.0000 mg | ORAL_TABLET | Freq: Every day | ORAL | Status: DC
  Administered 2022-10-14: 25 mg via ORAL
  Filled 2022-10-14: qty 1

## 2022-10-14 MED ORDER — LOSARTAN POTASSIUM 50 MG PO TABS
100.0000 mg | ORAL_TABLET | Freq: Every day | ORAL | Status: DC
  Administered 2022-10-14 – 2022-10-18 (×5): 100 mg via ORAL
  Filled 2022-10-14 (×6): qty 2

## 2022-10-14 MED ORDER — METOPROLOL TARTRATE 25 MG/10 ML ORAL SUSPENSION: 25.0000 mg | Freq: Two times a day (BID) | ORAL | Status: DC

## 2022-10-14 MED ORDER — LOSARTAN POTASSIUM-HCTZ 100-25 MG PO TABS: 1.0000 | ORAL_TABLET | Freq: Every day | ORAL | Status: DC

## 2022-10-14 MED ORDER — GLIMEPIRIDE 2 MG PO TABS
2.0000 mg | ORAL_TABLET | Freq: Two times a day (BID) | ORAL | Status: DC
  Administered 2022-10-14 – 2022-10-18 (×9): 2 mg via ORAL
  Filled 2022-10-14 (×12): qty 1

## 2022-10-14 MED ORDER — FUROSEMIDE 40 MG PO TABS
40.0000 mg | ORAL_TABLET | Freq: Every day | ORAL | Status: DC
  Administered 2022-10-15 – 2022-10-16 (×2): 40 mg via ORAL
  Filled 2022-10-14 (×2): qty 1

## 2022-10-14 MED ORDER — POTASSIUM CHLORIDE CRYS ER 10 MEQ PO TBCR
20.0000 meq | EXTENDED_RELEASE_TABLET | Freq: Every day | ORAL | Status: DC
  Administered 2022-10-14 – 2022-10-16 (×3): 20 meq via ORAL
  Filled 2022-10-14 (×7): qty 2

## 2022-10-14 MED ORDER — INSULIN ASPART 100 UNIT/ML IJ SOLN
0.0000 [IU] | Freq: Three times a day (TID) | INTRAMUSCULAR | Status: DC
  Administered 2022-10-14: 3 [IU] via SUBCUTANEOUS
  Administered 2022-10-14: 5 [IU] via SUBCUTANEOUS
  Administered 2022-10-14: 2 [IU] via SUBCUTANEOUS
  Administered 2022-10-15 (×2): 5 [IU] via SUBCUTANEOUS
  Administered 2022-10-16: 3 [IU] via SUBCUTANEOUS
  Administered 2022-10-16: 8 [IU] via SUBCUTANEOUS
  Administered 2022-10-16: 5 [IU] via SUBCUTANEOUS
  Administered 2022-10-17: 3 [IU] via SUBCUTANEOUS

## 2022-10-14 NOTE — Progress Notes (Signed)
      [address]       [place] [address]             [phone]      No complaints  BP 126/65   Pulse 85   Temp 98.1 F (36.7 C) (Oral)   Resp (!) 23   Ht '5\' 1"'$  (1.549 m)   Wt 87.1 kg   SpO2 90%   BMI 36.28 kg/m   Intake/Output Summary (Last 24 hours) at 10/14/2022 1804 Last data filed at 10/14/2022 1700 Gross per 24 hour  Intake 1639.87 ml  Output 2525 ml  Net -885.13 ml   CBG still elevated after meals- will add meal coverage  Remo Lipps C. Roxan Hockey, MD [facility] [phone]

## 2022-10-14 NOTE — Progress Notes (Signed)
2 Days Post-Op Procedure(s) (LRB): CORONARY ARTERY BYPASS GRAFTING (CABG) TIMES THREE USING LEFT INTERNAL MAMMARY ARTERY AND RIGHT LEG GREAT SAPHENOUS VEIN HARVESTED ENDOSCOPICALLY (N/A) TRANSESOPHAGEAL ECHOCARDIOGRAM (TEE) (N/A) Subjective: C/o sore throat, o/w feels well  Objective: Vital signs in last 24 hours: Temp:  [97.8 F (36.6 C)-98.5 F (36.9 C)] 98.5 F (36.9 C) (11/05 0815) Pulse Rate:  [71-93] 91 (11/05 0900) Cardiac Rhythm: Normal sinus rhythm (11/05 0800) Resp:  [14-24] 21 (11/05 0900) BP: (122-156)/(61-97) 156/64 (11/05 0900) SpO2:  [89 %-99 %] 95 % (11/05 0900) Weight:  [87.1 kg] 87.1 kg (11/05 0515)  Hemodynamic parameters for last 24 hours:    Intake/Output from previous day: 11/04 0701 - 11/05 0700 In: 2034.6 [P.O.:1440; I.V.:294.7; IV Piggyback:299.9] Out: 2075 [Urine:2075] Intake/Output this shift: Total I/O In: 240 [P.O.:240] Out: 550 [Urine:550]  General appearance: alert, cooperative, and no distress Neurologic: intact Heart: regular rate and rhythm Lungs: diminished breath sounds bibasilar Abdomen: normal findings: soft, non-tender  Lab Results: Recent Labs    10/13/22 1657 10/14/22 0313  WBC 15.6* 14.9*  HGB 10.6* 10.4*  HCT 31.8* 32.2*  PLT 200 213   BMET:  Recent Labs    10/13/22 1657 10/14/22 0313  NA 138 139  K 4.3 3.7  CL 102 105  CO2 24 26  GLUCOSE 263* 71  BUN 25* 24*  CREATININE 1.61* 1.56*  CALCIUM 8.5* 8.5*    PT/INR:  Recent Labs    10/12/22 1305  LABPROT 14.2  INR 1.1   ABG    Component Value Date/Time   PHART 7.336 (L) 10/12/2022 2346   HCO3 22.6 10/12/2022 2346   TCO2 24 10/12/2022 2346   ACIDBASEDEF 3.0 (H) 10/12/2022 2346   O2SAT 94 10/12/2022 2346   CBG (last 3)  Recent Labs    10/14/22 0312 10/14/22 0358 10/14/22 0827  GLUCAP 68* 107* 189*    Assessment/Plan: S/P Procedure(s) (LRB): CORONARY ARTERY BYPASS GRAFTING (CABG) TIMES THREE USING LEFT INTERNAL MAMMARY ARTERY AND RIGHT LEG  GREAT SAPHENOUS VEIN HARVESTED ENDOSCOPICALLY (N/A) TRANSESOPHAGEAL ECHOCARDIOGRAM (TEE) (N/A) POD # 2  Doing well NEURO- intact CV- in Sr  Hypertensive- increase Lopressor to 25 BID, resume Hyzaar  ASA, statin RESP- continue IS RENAL- creatinine down slightly this AM  Still fluid overloaded- continue IV Lasix today ENDO - CBG variable  Resume Amaryl, change SSI to [facility] and HS GI- tolerating diet Anemia secondary to ABL- stable, monitor Cardiac rehab   LOS: 12 days    Melrose Nakayama 10/14/2022

## 2022-10-15 ENCOUNTER — Encounter (HOSPITAL_COMMUNITY): Payer: Self-pay | Admitting: Surgery

## 2022-10-15 DIAGNOSIS — I2510 Atherosclerotic heart disease of native coronary artery without angina pectoris: Secondary | ICD-10-CM | POA: Diagnosis not present

## 2022-10-15 DIAGNOSIS — Z951 Presence of aortocoronary bypass graft: Secondary | ICD-10-CM | POA: Diagnosis not present

## 2022-10-15 DIAGNOSIS — E118 Type 2 diabetes mellitus with unspecified complications: Secondary | ICD-10-CM

## 2022-10-15 LAB — BASIC METABOLIC PANEL
Anion gap: 11 (ref 5–15)
BUN: 24 mg/dL — ABNORMAL HIGH (ref 8–23)
CO2: 28 mmol/L (ref 22–32)
Calcium: 8.9 mg/dL (ref 8.9–10.3)
Chloride: 101 mmol/L (ref 98–111)
Creatinine, Ser: 1.51 mg/dL — ABNORMAL HIGH (ref 0.44–1.00)
GFR, Estimated: 38 mL/min — ABNORMAL LOW (ref >60)
Glucose, Bld: 79 mg/dL (ref 70–99)
Potassium: 3.7 mmol/L (ref 3.5–5.1)
Sodium: 140 mmol/L (ref 135–145)

## 2022-10-15 LAB — CBC
HCT: 32.1 % — ABNORMAL LOW (ref 36.0–46.0)
Hemoglobin: 10.2 g/dL — ABNORMAL LOW (ref 12.0–15.0)
MCH: 30.6 pg (ref 26.0–34.0)
MCHC: 31.8 g/dL (ref 30.0–36.0)
MCV: 96.4 fL (ref 80.0–100.0)
Platelets: 241 10*3/uL (ref 150–400)
RBC: 3.33 MIL/uL — ABNORMAL LOW (ref 3.87–5.11)
RDW: 14 % (ref 11.5–15.5)
WBC: 14 10*3/uL — ABNORMAL HIGH (ref 4.0–10.5)
nRBC: 0 % (ref 0.0–0.2)

## 2022-10-15 LAB — GLUCOSE, CAPILLARY
Glucose-Capillary: 133 mg/dL — ABNORMAL HIGH (ref 70–99)
Glucose-Capillary: 206 mg/dL — ABNORMAL HIGH (ref 70–99)
Glucose-Capillary: 222 mg/dL — ABNORMAL HIGH (ref 70–99)
Glucose-Capillary: 254 mg/dL — ABNORMAL HIGH (ref 70–99)

## 2022-10-15 MED ORDER — ASPIRIN 325 MG PO TBEC
325.0000 mg | DELAYED_RELEASE_TABLET | Freq: Every day | ORAL | Status: DC
  Administered 2022-10-15 – 2022-10-18 (×4): 325 mg via ORAL
  Filled 2022-10-15 (×4): qty 1

## 2022-10-15 MED ORDER — SODIUM CHLORIDE 0.9 % IV SOLN: 250.0000 mL | INTRAVENOUS | Status: DC | PRN

## 2022-10-15 MED ORDER — OXYCODONE HCL 5 MG PO TABS
5.0000 mg | ORAL_TABLET | ORAL | Status: DC | PRN
  Administered 2022-10-16 – 2022-10-17 (×2): 5 mg via ORAL
  Administered 2022-10-18: 10 mg via ORAL
  Filled 2022-10-15: qty 2
  Filled 2022-10-15 (×2): qty 1

## 2022-10-15 MED ORDER — METOPROLOL TARTRATE 25 MG PO TABS
25.0000 mg | ORAL_TABLET | Freq: Two times a day (BID) | ORAL | Status: DC
  Administered 2022-10-15 – 2022-10-18 (×7): 25 mg via ORAL
  Filled 2022-10-15 (×7): qty 1

## 2022-10-15 MED ORDER — ~~LOC~~ CARDIAC SURGERY, PATIENT & FAMILY EDUCATION: Freq: Once | Status: AC

## 2022-10-15 MED ORDER — ONDANSETRON HCL 4 MG/2ML IJ SOLN: 4.0000 mg | Freq: Four times a day (QID) | INTRAMUSCULAR | Status: DC | PRN

## 2022-10-15 MED ORDER — SODIUM CHLORIDE 0.9% FLUSH
3.0000 mL | Freq: Two times a day (BID) | INTRAVENOUS | Status: DC
  Administered 2022-10-15 – 2022-10-18 (×7): 3 mL via INTRAVENOUS

## 2022-10-15 MED ORDER — SODIUM CHLORIDE 0.9% FLUSH: 3.0000 mL | INTRAVENOUS | Status: DC | PRN

## 2022-10-15 MED ORDER — TRAMADOL HCL 50 MG PO TABS
50.0000 mg | ORAL_TABLET | Freq: Four times a day (QID) | ORAL | Status: DC | PRN
  Administered 2022-10-15 – 2022-10-18 (×6): 50 mg via ORAL
  Filled 2022-10-15 (×6): qty 1

## 2022-10-15 MED ORDER — SENNOSIDES-DOCUSATE SODIUM 8.6-50 MG PO TABS: 1.0000 | ORAL_TABLET | Freq: Two times a day (BID) | ORAL | Status: DC | PRN

## 2022-10-15 MED ORDER — ONDANSETRON HCL 4 MG PO TABS: 4.0000 mg | ORAL_TABLET | Freq: Four times a day (QID) | ORAL | Status: DC | PRN

## 2022-10-15 MED FILL — Thrombin (Recombinant) For Soln 20000 Unit: CUTANEOUS | Qty: 1 | Status: AC

## 2022-10-15 NOTE — Progress Notes (Signed)
Pt admitted to rm 15 from [facility]. CHG wipe given. Initiated tele. VSS. Call bell within reach.   Lavenia Atlas, RN

## 2022-10-15 NOTE — Progress Notes (Signed)
Rounding Note    Patient Name: Amanda Mendez Date of Encounter: 10/15/2022  [place] Cardiologist: Minus Breeding, MD   Subjective   Doing well overall, pain well controlled.  Inpatient Medications    Scheduled Meds:  acetaminophen  1,000 mg Oral Q6H   Or   acetaminophen (TYLENOL) oral liquid 160 mg/5 mL  1,000 mg Per Tube Q6H   aspirin EC  325 mg Oral Daily   DULoxetine  30 mg Oral Daily   enoxaparin (LOVENOX) injection  40 mg Subcutaneous QHS   ezetimibe  10 mg Oral Daily   furosemide  40 mg Oral Daily   glimepiride  2 mg Oral BID   insulin aspart  0-15 Units Subcutaneous TID WC   insulin aspart  4 Units Subcutaneous TID WC   losartan  100 mg Oral Daily   metoprolol tartrate  25 mg Oral BID   pantoprazole  40 mg Oral Daily   potassium chloride  20 mEq Oral Daily   rosuvastatin  40 mg Oral Daily   sodium chloride flush  3 mL Intravenous Q12H   Continuous Infusions:  sodium chloride     PRN Meds: sodium chloride, ondansetron **OR** ondansetron (ZOFRAN) IV, mouth rinse, oxyCODONE, phenol, senna-docusate, sodium chloride flush, traMADol   Vital Signs    Vitals:   10/15/22 1300 10/15/22 1357 10/15/22 1400 10/15/22 1604  BP:    114/66  Pulse:    74  Resp: (!) '23 20 20 18  '$ Temp:    97.8 F (36.6 C)  TempSrc:    Oral  SpO2:    94%  Weight:      Height:        Intake/Output Summary (Last 24 hours) at 10/15/2022 1657 Last data filed at 10/15/2022 1200 Gross per 24 hour  Intake 1190 ml  Output 1690 ml  Net -500 ml      10/15/2022    5:15 AM 10/14/2022    5:15 AM 10/13/2022    5:00 AM  Last 3 Weights  Weight (lbs) 188 lb 15 oz 192 lb 0.3 oz 193 lb 2 oz  Weight (kg) 85.7 kg 87.1 kg 87.6 kg      Telemetry    SR with occasional PVC - Personally Reviewed  ECG    No new - Personally Reviewed  Physical Exam   GEN: No acute distress.   Neck: No JVD Cardiac: RRR, no murmurs, rubs, or gallops.  Respiratory: Clear to auscultation  bilaterally. GI: Soft, nontender, non-distended  MS: trivial LE edema; L foot dressed with bandage, L fifth toe site with mild hematoma but no warmth or drainage Neuro:  Nonfocal  Psych: Normal affect   Labs    High Sensitivity Troponin:   Recent Labs  Lab 10/02/22 1529 10/02/22 1710 10/03/22 0158  TROPONINIHS 22* 22* 21*     Chemistry Recent Labs  Lab 10/12/22 0113 10/12/22 0807 10/12/22 1834 10/12/22 2247 10/13/22 0257 10/13/22 1657 10/14/22 0313 10/15/22 0320  NA 136   < > 138   < > 139 138 139 140  K 4.2   < > 4.5   < > 4.1 4.3 3.7 3.7  CL 104   < > 107  --  108 102 105 101  CO2 24  --  23  --  '24 24 26 28  '$ GLUCOSE 253*   < > 168*  --  118* 263* 71 79  BUN 32*   < > 24*  --  22 25* 24*  24*  CREATININE 1.61*   < > 1.18*  --  1.32* 1.61* 1.56* 1.51*  CALCIUM 9.3  --  7.9*  --  8.2* 8.5* 8.5* 8.9  MG 1.8  --  3.1*  --  2.5* 2.4  --   --   PROT 6.7  --   --   --   --   --   --   --   ALBUMIN 3.3*  --   --   --   --   --   --   --   AST 16  --   --   --   --   --   --   --   ALT 24  --   --   --   --   --   --   --   ALKPHOS 91  --   --   --   --   --   --   --   BILITOT 0.6  --   --   --   --   --   --   --   GFRNONAA 35*  --  51*  --  44* 35* 36* 38*  ANIONGAP 8  --  8  --  '7 12 8 11   '$ < > = values in this interval not displayed.    Lipids No results for input(s): "CHOL", "TRIG", "HDL", "LABVLDL", "LDLCALC", "CHOLHDL" in the last 168 hours.  Hematology Recent Labs  Lab 10/13/22 1657 10/14/22 0313 10/15/22 0320  WBC 15.6* 14.9* 14.0*  RBC 3.40* 3.34* 3.33*  HGB 10.6* 10.4* 10.2*  HCT 31.8* 32.2* 32.1*  MCV 93.5 96.4 96.4  MCH 31.2 31.1 30.6  MCHC 33.3 32.3 31.8  RDW 14.5 14.3 14.0  PLT 200 213 241   Thyroid No results for input(s): "TSH", "FREET4" in the last 168 hours.  BNPNo results for input(s): "BNP", "PROBNP" in the last 168 hours.  DDimer No results for input(s): "DDIMER" in the last 168 hours.   Radiology    DG Chest Port 1 View  Result  Date: 10/14/2022 CLINICAL DATA:  Status post coronary bypass grafting EXAM: PORTABLE CHEST 1 VIEW COMPARISON:  10/13/2022 FINDINGS: Cardiac shadow remains enlarged. Postsurgical changes are again noted. Swan-Ganz catheter has been removed in the interval as has a mediastinal drain and left-sided chest tube. Pericardial drain is also been removed. No thorax is identified. Mild left basilar atelectasis is seen. Right jugular sheath remains. IMPRESSION: Interval removal of tubes and lines as described. Mild left basilar atelectasis. Electronically Signed   By: Inez Catalina M.D.   On: 10/14/2022 08:43    Cardiac Studies   Cath 10/03/22   1st Mrg lesion is 10% stenosed.   1.  Severe multivessel disease consisting of chronic total occlusion of right coronary artery (which has a high anterior takeoff), proximal LAD, and first diagonal disease. 2.  Patent first obtuse marginal stent. 3.  LVEDP of 26 mmHg.   Recommendation: Cardiothoracic surgery consultation; results discussed with Dr. Harl Bowie.  Echo 10/03/22  1. Left ventricular ejection fraction, by estimation, is 70 to 75%. The  left ventricle has hyperdynamic function. The left ventricle has no  regional wall motion abnormalities. There is mild concentric left  ventricular hypertrophy. Left ventricular  diastolic parameters are consistent with Grade I diastolic dysfunction  (impaired relaxation).   2. Right ventricular systolic function is normal. The right ventricular  size is normal.   3. The mitral valve is normal in  structure. No evidence of mitral valve  regurgitation. No evidence of mitral stenosis.   4. The aortic valve is normal in structure. Aortic valve regurgitation is  not visualized. No aortic stenosis is present.   5. The inferior vena cava is normal in size with greater than 50%  respiratory variability, suggesting right atrial pressure of 3 mmHg.   Patient Profile     66 y.o. female with CAD, hypertension, type II diabetes  presenting with chest pain, now s/p CABG.  Assessment & Plan    CAD S/P CABG x 3 (10/12/22, Dr. Cyndia Bent) LIMA-LAD, SVG-diag, SVG-RCA -recovering well post CABG -continue aspirin, rosuvastatin, ezetimibe -continue beta blocker  Hypertension -continue metoprolol, losartan  Dry gangrene, left 5th toe -wound inspected today, small hematoma but healing well overall without discharge -was followed by podiatry, dressing changed today by team  Type 2 diabetes with chronic kidney disease stage 3a Chronic diastolic heart failure -continue lasix -will aim for SGLT2i this admission  For questions or updates, please contact [place] Please consult [email or web address] for contact info under        Signed, Buford Dresser, MD  10/15/2022, 4:57 PM

## 2022-10-15 NOTE — Progress Notes (Signed)
CARDIAC REHAB PHASE I   Pt resting in bed feeling well today. Reports three walks today tolerating well. Back to bed now for afternoon rest. Encouraged IS use, able to reach 1000 today. Reviewed proper sternal precautions and move in the tub. States able to adhere to those restrictions with help from ICU staff. Will continue to follow.   [id number]   Vanessa Barbara, RN BSN 10/15/2022 1:41 PM

## 2022-10-15 NOTE — Progress Notes (Signed)
3 Days Post-Op Procedure(s) (LRB): CORONARY ARTERY BYPASS GRAFTING (CABG) TIMES THREE USING LEFT INTERNAL MAMMARY ARTERY AND RIGHT LEG GREAT SAPHENOUS VEIN HARVESTED ENDOSCOPICALLY (N/A) TRANSESOPHAGEAL ECHOCARDIOGRAM (TEE) (N/A) Subjective: No complaints. Ambulating well.  Objective: Vital signs in last 24 hours: Temp:  [97.8 F (36.6 C)-98.5 F (36.9 C)] 97.8 F (36.6 C) (11/06 0400) Pulse Rate:  [69-93] 74 (11/06 0500) Cardiac Rhythm: Normal sinus rhythm (11/06 0400) Resp:  [3-25] 14 (11/06 0500) BP: (100-159)/(54-100) 100/66 (11/06 0500) SpO2:  [89 %-100 %] 92 % (11/06 0500) Weight:  [85.7 kg] 85.7 kg (11/06 0515)  Hemodynamic parameters for last 24 hours:    Intake/Output from previous day: 11/05 0701 - 11/06 0700 In: 1190 [P.O.:1190] Out: 2925 [Urine:2925] Intake/Output this shift: No intake/output data recorded.  General appearance: alert and cooperative Neurologic: intact Heart: regular rate and rhythm, S1, S2 normal, no murmur Lungs: clear to auscultation bilaterally Extremities: edema mild Wound: dressing dry  Lab Results: Recent Labs    10/14/22 0313 10/15/22 0320  WBC 14.9* 14.0*  HGB 10.4* 10.2*  HCT 32.2* 32.1*  PLT 213 241   BMET:  Recent Labs    10/14/22 0313 10/15/22 0320  NA 139 140  K 3.7 3.7  CL 105 101  CO2 26 28  GLUCOSE 71 79  BUN 24* 24*  CREATININE 1.56* 1.51*  CALCIUM 8.5* 8.9    PT/INR:  Recent Labs    10/12/22 1305  LABPROT 14.2  INR 1.1   ABG    Component Value Date/Time   PHART 7.336 (L) 10/12/2022 2346   HCO3 22.6 10/12/2022 2346   TCO2 24 10/12/2022 2346   ACIDBASEDEF 3.0 (H) 10/12/2022 2346   O2SAT 94 10/12/2022 2346   CBG (last 3)  Recent Labs    10/14/22 1118 10/14/22 1551 10/14/22 2148  GLUCAP 248* 130* 158*    Assessment/Plan: S/P Procedure(s) (LRB): CORONARY ARTERY BYPASS GRAFTING (CABG) TIMES THREE USING LEFT INTERNAL MAMMARY ARTERY AND RIGHT LEG GREAT SAPHENOUS VEIN HARVESTED ENDOSCOPICALLY  (N/A) TRANSESOPHAGEAL ECHOCARDIOGRAM (TEE) (N/A)  POD 3 Hemodynamically stable in sinus rhythm. Continue Lopressor and Cozaar for HTN.  DM: glucose under adequate control. Continue Amaryl and SSI with meal coverage. Poorly controlled preop.  Volume excess: - 1700 cc yesterday and wt down 3 lbs. Still 9 lbs over preop. Continue daily lasix.  DC sleeve and foley.  IS, mobilization.  Transfer to 4E.   LOS: 13 days    Gaye Pollack 10/15/2022

## 2022-10-16 ENCOUNTER — Telehealth (HOSPITAL_COMMUNITY): Payer: Self-pay | Admitting: Pharmacy Technician

## 2022-10-16 ENCOUNTER — Telehealth: Payer: Self-pay | Admitting: Podiatry

## 2022-10-16 ENCOUNTER — Other Ambulatory Visit (HOSPITAL_COMMUNITY): Payer: Self-pay

## 2022-10-16 DIAGNOSIS — I10 Essential (primary) hypertension: Secondary | ICD-10-CM | POA: Diagnosis not present

## 2022-10-16 DIAGNOSIS — Z951 Presence of aortocoronary bypass graft: Secondary | ICD-10-CM | POA: Diagnosis not present

## 2022-10-16 DIAGNOSIS — E118 Type 2 diabetes mellitus with unspecified complications: Secondary | ICD-10-CM | POA: Diagnosis not present

## 2022-10-16 DIAGNOSIS — I2510 Atherosclerotic heart disease of native coronary artery without angina pectoris: Secondary | ICD-10-CM | POA: Diagnosis not present

## 2022-10-16 LAB — BASIC METABOLIC PANEL WITH GFR
Anion gap: 9 (ref 5–15)
BUN: 31 mg/dL — ABNORMAL HIGH (ref 8–23)
CO2: 28 mmol/L (ref 22–32)
Calcium: 8.8 mg/dL — ABNORMAL LOW (ref 8.9–10.3)
Chloride: 102 mmol/L (ref 98–111)
Creatinine, Ser: 1.71 mg/dL — ABNORMAL HIGH (ref 0.44–1.00)
GFR, Estimated: 32 mL/min — ABNORMAL LOW
Glucose, Bld: 205 mg/dL — ABNORMAL HIGH (ref 70–99)
Potassium: 4.3 mmol/L (ref 3.5–5.1)
Sodium: 139 mmol/L (ref 135–145)

## 2022-10-16 LAB — GLUCOSE, CAPILLARY
Glucose-Capillary: 179 mg/dL — ABNORMAL HIGH (ref 70–99)
Glucose-Capillary: 279 mg/dL — ABNORMAL HIGH (ref 70–99)
Glucose-Capillary: 240 mg/dL — ABNORMAL HIGH (ref 70–99)
Glucose-Capillary: 188 mg/dL — ABNORMAL HIGH (ref 70–99)

## 2022-10-16 MED ORDER — TORSEMIDE 20 MG PO TABS
40.0000 mg | ORAL_TABLET | Freq: Every day | ORAL | Status: DC
  Administered 2022-10-17 – 2022-10-18 (×2): 40 mg via ORAL
  Filled 2022-10-16 (×2): qty 2

## 2022-10-16 NOTE — Telephone Encounter (Signed)
[place] Physical therapist Colbert Ewing is on the phone reqarding this pt she has surgery on 10.28 and it was noted pt should be in a surgical shoe and pt has not been given one. So she has been walking around without one. The therapist is asking should she put pt in a surgical shoe. Pt has left 5th toe amputation and has been walking on it since surgery Her cell number is [phone]

## 2022-10-16 NOTE — Evaluation (Addendum)
Occupational Therapy Evaluation Patient Details Name: Amanda Mendez MRN: [id number] DOB: Aug 02, 1956 Today's Date: 10/16/2022   History of Present Illness 66yo female admitted 10/24 with chest pain. 10/27 angiography due to left 5th toe gangrene. 10/28 Left 5th toe amputation. 11/3 CABGx 3. PMHx: CAD, HTN, T2DM   Clinical Impression   Pt independent at baseline with ADLs and functional mobility, lives with family who can assist at d/c. Pt currently needing min guard-min A for ADLs, mod A for bed mobility and min guard for transfers with RW and L post op shoe donned. Pt educated on sternal precautions, needing min cues throughout session to adhere. Began education on compensatory strategies for ADLs. Pt presenting with impairments listed below, will follow acutely. Recommend HHOT at d/c pending progression.      Recommendations for follow up therapy are one component of a multi-disciplinary discharge planning process, led by the attending physician.  Recommendations may be updated based on patient status, additional functional criteria and insurance authorization.   Follow Up Recommendations  Home health OT (pending progression)    Assistance Recommended at Discharge Frequent or constant Supervision/Assistance  Patient can return home with the following A little help with walking and/or transfers;A little help with bathing/dressing/bathroom;Direct supervision/assist for medications management;Direct supervision/assist for financial management;Assist for transportation;Help with stairs or ramp for entrance    Functional Status Assessment  Patient has had a recent decline in their functional status and demonstrates the ability to make significant improvements in function in a reasonable and predictable amount of time.  Equipment Recommendations  BSC/3in1    Recommendations for Other Services PT consult     Precautions / Restrictions Precautions Precautions: Sternal Precaution Booklet  Issued: No Precaution Comments: has surgical book Restrictions Weight Bearing Restrictions: Yes LLE Weight Bearing: Weight bearing as tolerated Other Position/Activity Restrictions: LLE post op shoe      Mobility Bed Mobility Overal bed mobility: Needs Assistance       Supine to sit: Mod assist     General bed mobility comments: cues for precautions    Transfers Overall transfer level: Needs assistance   Transfers: Sit to/from Stand Sit to Stand: Min guard                  Balance Overall balance assessment: Needs assistance Sitting-balance support: Bilateral upper extremity supported, Feet supported Sitting balance-Leahy Scale: Good Sitting balance - Comments: able to reach outside BOS without LOB   Standing balance support: During functional activity, Reliant on assistive device for balance Standing balance-Leahy Scale: Fair Standing balance comment: stands statically without external support                           ADL either performed or assessed with clinical judgement   ADL Overall ADL's : Needs assistance/impaired Eating/Feeding: Modified independent   Grooming: Min guard   Upper Body Bathing: Minimal assistance   Lower Body Bathing: Minimal assistance   Upper Body Dressing : Minimal assistance   Lower Body Dressing: Minimal assistance   Toilet Transfer: Min guard   Toileting- Clothing Manipulation and Hygiene: Min guard       Functional mobility during ADLs: Min guard;Rolling walker (2 wheels)       Vision   Vision Assessment?: No apparent visual deficits     Perception     Praxis      Pertinent Vitals/Pain Pain Assessment Pain Assessment: No/denies pain Pain Score: 2  Faces Pain Scale: Hurts a little bit  Pain Location: incision Pain Descriptors / Indicators: Discomfort Pain Intervention(s): RN gave pain meds during session, Limited activity within patient's tolerance, Monitored during session, Repositioned      Hand Dominance Right   Extremity/Trunk Assessment Upper Extremity Assessment Upper Extremity Assessment: Generalized weakness   Lower Extremity Assessment Lower Extremity Assessment: Generalized weakness   Cervical / Trunk Assessment Cervical / Trunk Assessment: Kyphotic   Communication Communication Communication: No difficulties   Cognition Arousal/Alertness: Awake/alert Behavior During Therapy: WFL for tasks assessed/performed Overall Cognitive Status: Within Functional Limits for tasks assessed                                       General Comments  VSS on RA    Exercises     Shoulder Instructions      Home Living Family/patient expects to be discharged to:: Private residence Living Arrangements: Other (Comment) (son and daughter/granddaugghter) Available Help at Discharge: Family;Available PRN/intermittently Type of Home: House Home Access: Ramped entrance     Home Layout: One level     Bathroom Shower/Tub: Occupational psychologist: Standard Bathroom Accessibility: Yes   Home Equipment: Cane - single point;Shower seat;Hand held shower head          Prior Functioning/Environment Prior Level of Function : Independent/Modified Independent             Mobility Comments: used cane ADLs Comments: ind        OT Problem List: Decreased strength;Decreased activity tolerance;Decreased range of motion;Decreased coordination;Decreased safety awareness;Cardiopulmonary status limiting activity      OT Treatment/Interventions: Self-care/ADL training;Therapeutic exercise;Energy conservation;DME and/or AE instruction;Therapeutic activities;Patient/family education;Balance training    OT Goals(Current goals can be found in the care plan section) Acute Rehab OT Goals Patient Stated Goal: none stated OT Goal Formulation: With patient Time For Goal Achievement: 10/30/22 Potential to Achieve Goals: Good ADL Goals Pt Will Perform  Upper Body Dressing: with supervision;sitting;standing Pt Will Perform Lower Body Dressing: with supervision;sitting/lateral leans;sit to/from stand Pt Will Transfer to Toilet: with supervision;ambulating;regular height toilet Pt Will Perform Tub/Shower Transfer: Shower transfer;with supervision;ambulating;shower seat Additional ADL Goal #1: Pt will perform bed mobility mod I in prep for ADLs  OT Frequency: Min 2X/week    Co-evaluation              AM-PAC OT "6 Clicks" Daily Activity     Outcome Measure Help from another person eating meals?: None Help from another person taking care of personal grooming?: A Little Help from another person toileting, which includes using toliet, bedpan, or urinal?: A Little Help from another person bathing (including washing, rinsing, drying)?: A Little Help from another person to put on and taking off regular upper body clothing?: A Little Help from another person to put on and taking off regular lower body clothing?: A Little 6 Click Score: 19   End of Session Equipment Utilized During Treatment: Rolling walker (2 wheels);Other (comment) (post op shoe) Nurse Communication: Mobility status  Activity Tolerance: Patient tolerated treatment well Patient left: in bed;with call bell/phone within reach;with bed alarm set  OT Visit Diagnosis: Unsteadiness on feet (R26.81);Other abnormalities of gait and mobility (R26.89);Muscle weakness (generalized) (M62.81)                Time: 9211-9417 OT Time Calculation (min): 30 min Charges:  OT General Charges $OT Visit: 1 Visit OT Evaluation $OT Eval Moderate Complexity: 1 Mod OT  Treatments $Self Care/Home Management : 8-22 mins  Lynnda Child, OTD, OTR/L Acute Rehab [phone]  Kaylyn Lim 10/16/2022, 5:31 PM

## 2022-10-16 NOTE — Inpatient Diabetes Management (Addendum)
Inpatient Diabetes Program Recommendations  AACE/ADA: New Consensus Statement on Inpatient Glycemic Control (2015)  Target Ranges:  Prepandial:   less than 140 mg/dL      Peak postprandial:   less than 180 mg/dL (1-2 hours)      Critically ill patients:  140 - 180 mg/dL   Lab Results  Component Value Date   GLUCAP 179 (H) 10/16/2022   HGBA1C 11.3 (H) 10/03/2022    Review of Glycemic Control  Latest Reference Range & Units 10/15/22 08:04 10/15/22 11:19 10/15/22 17:05 10/15/22 21:34 10/16/22 06:21  Glucose-Capillary 70 - 99 mg/dL 133 (H) 206 (H)  Novolog 9 units 222 (H)  Novolog 9 units 254 (H) 179 (H)  Novolog 7 units   Diabetes history: DM 2 Outpatient Diabetes medications: Amaryl 2 mg tid, Lantus (dose not listed in chart) Current orders for Inpatient glycemic control:  Novolog 0-15 units tid Novolog 4 units tid meal coverage Amaryl 2 mg BID  A1c 11.3% on 10/03/22  Inpatient Diabetes Program Recommendations:    -  Consider increasing Novolog meal coverage to 6 units tid if eating >50% of meals -  Add Novolog hs scale  Thanks,  Tama Headings RN, MSN, BC-ADM Inpatient Diabetes Coordinator Team Pager [phone] (8a-5p)

## 2022-10-16 NOTE — Progress Notes (Addendum)
[address]       [place] [address]             [phone]      4 Days Post-Op Procedure(s) (LRB): CORONARY ARTERY BYPASS GRAFTING (CABG) TIMES THREE USING LEFT INTERNAL MAMMARY ARTERY AND RIGHT LEG GREAT SAPHENOUS VEIN HARVESTED ENDOSCOPICALLY (N/A) TRANSESOPHAGEAL ECHOCARDIOGRAM (TEE) (N/A) Subjective: Transferred from ICU yesterday.  Sitting up finishing breakfast. Feels she is progressing well, has no complaints.  Walked in the hall yesterday.   Passing gas, no BM yet. On RA  Objective: Vital signs in last 24 hours: Temp:  [97.7 F (36.5 C)-98 F (36.7 C)] 98 F (36.7 C) (11/07 0419) Pulse Rate:  [73-84] 73 (11/07 0419) Cardiac Rhythm: Normal sinus rhythm (11/06 1939) Resp:  [14-23] 20 (11/07 0419) BP: (105-145)/(45-66) 141/61 (11/07 0419) SpO2:  [88 %-96 %] 96 % (11/07 0419) Weight:  [86.3 kg] 86.3 kg (11/07 0419)    Intake/Output from previous day: 11/06 0701 - 11/07 0700 In: 960 [P.O.:960] Out: 90 [Urine:90] Intake/Output this shift: No intake/output data recorded.  General appearance: alert and cooperative Neurologic: intact Heart: regular rate and rhythm Lungs: clear to auscultation bilaterally Extremities: Minimal LE edema Wound: the sternotomy incision and RLE EVH incision are intact and dry.  Lab Results: Recent Labs    10/14/22 0313 10/15/22 0320  WBC 14.9* 14.0*  HGB 10.4* 10.2*  HCT 32.2* 32.1*  PLT 213 241    BMET:  Recent Labs    10/15/22 0320 10/16/22 0128  NA 140 139  K 3.7 4.3  CL 101 102  CO2 28 28  GLUCOSE 79 205*  BUN 24* 31*  CREATININE 1.51* 1.71*  CALCIUM 8.9 8.8*     PT/INR:  No results for input(s): "LABPROT", "INR" in the last 72 hours.  ABG    Component Value Date/Time   PHART 7.336 (L) 10/12/2022 2346   HCO3 22.6 10/12/2022 2346   TCO2 24 10/12/2022 2346   ACIDBASEDEF 3.0 (H) 10/12/2022 2346   O2SAT 94 10/12/2022 2346   CBG (last 3)  Recent Labs    10/15/22 1705  10/15/22 2134 10/16/22 0621  GLUCAP 222* 254* 179*     Assessment/Plan: S/P Procedure(s) (LRB): CORONARY ARTERY BYPASS GRAFTING (CABG) TIMES THREE USING LEFT INTERNAL MAMMARY ARTERY AND RIGHT LEG GREAT SAPHENOUS VEIN HARVESTED ENDOSCOPICALLY (N/A) TRANSESOPHAGEAL ECHOCARDIOGRAM (TEE) (N/A)  -POD 4 CABG x 3. Pre-op EF 70%.Stable VS and cardiac rhythm. On ASA, statin, BB.    -H/O HTN- Good control. Continue Lopressor and Cozaar.  -ENDO-Type 2 DM: CBG's 180-250.  Continue Amaryl and SSI with meal coverage. Was not well controlled pre-op.  -RENAL- Stage 3 CKD with creat near baseline. Has olume excess with Wt about 10lbs+.  I&O not accurately recorded yesterday. Continue diuresis with Lasix.  -Gangrene left 5 toe- S/P amputation on 10/28 by Dr. Amalia Hailey. Dressing changed yesterday per cardiology notes and found to be healing well.   -Disposition- planning for eventual discharge to home. Will request PT / OT eval to assess needs.     LOS: 14 days    Amanda Mendez [phone] 10/16/2022   Chart reviewed, patient examined, agree with above. Says she feels well overall and ambulating. On room air. Says she was short of breath laying down last night and had to sleep in chair. She is still 10 lbs over preop wt. Did not diurese with lasix 40 but creat 1.7 today. Baseline 1.5. Will check 2V CXR in  am. Change lasix to Demedex 40. Repeat BMET in am.

## 2022-10-16 NOTE — Evaluation (Signed)
Physical Therapy Evaluation/ Discharge Patient Details Name: Amanda Mendez MRN: [id number] DOB: 1956/08/17 Today's Date: 10/16/2022  History of Present Illness  66yo female admitted 10/24 with chest pain. 10/27 angiography due to left 5th toe gangrene. 10/28 Left 5th toe amputation. 11/3 CABGx 3. PMHx: CAD, HTN, T2DM  Clinical Impression  Pt very pleasant, moving well and aware of 3/4 sternal precautions. Pt with education for all sternal precautions, bed mobility, scooting, walking program, RW use and progression. Pt with decreased activity tolerance and encouraged mobility with cardiac rehab and mobility specialists without further acute therapy needs. Pt agreeable and will sign off. Pt has all necessary DME at home.   HR 84 BP 139/61      Recommendations for follow up therapy are one component of a multi-disciplinary discharge planning process, led by the attending physician.  Recommendations may be updated based on patient status, additional functional criteria and insurance authorization.  Follow Up Recommendations No PT follow up      Assistance Recommended at Discharge PRN  Patient can return home with the following  Assistance with cooking/housework;Assist for transportation    Equipment Recommendations None recommended by PT  Recommendations for Other Services       Functional Status Assessment Patient has not had a recent decline in their functional status     Precautions / Restrictions Precautions Precautions: Sternal Precaution Booklet Issued: No Precaution Comments: has surgical book Restrictions Weight Bearing Restrictions: Yes LLE Weight Bearing: Weight bearing as tolerated Other Position/Activity Restrictions: LLE post op shoe      Mobility  Bed Mobility Overal bed mobility: Needs Assistance Bed Mobility: Supine to Sit, Sit to Supine     Supine to sit: Min assist Sit to supine: Min guard   General bed mobility comments: cues for sequence and  precautions with min assist to lift trunk from surface, guarding for transition to supine with cues for sequence and precautions    Transfers Overall transfer level: Needs assistance   Transfers: Sit to/from Stand Sit to Stand: Supervision           General transfer comment: pt mod I for transfers sit to stand/ supervision for scooting to not push with bil UE    Ambulation/Gait Ambulation/Gait assistance: Modified independent (Device/Increase time) Gait Distance (Feet): 300 Feet Assistive device: Rolling walker (2 wheels) Gait Pattern/deviations: Step-through pattern   Gait velocity interpretation: 1.31 - 2.62 ft/sec, indicative of limited community ambulator   General Gait Details: pt able to walk 5' with HHA but reports not ready to walk further without RW, educated for posture and proximity to [employer] [place] (Stroke Patients Only)       Balance Overall balance assessment: Mild deficits observed, not formally tested                                           Pertinent Vitals/Pain Pain Assessment Pain Assessment: No/denies pain    Home Living                          Prior Function                       Hand Dominance        Extremity/Trunk Assessment  Upper Extremity Assessment Upper Extremity Assessment: Generalized weakness    Lower Extremity Assessment Lower Extremity Assessment: Generalized weakness    Cervical / Trunk Assessment Cervical / Trunk Assessment: Kyphotic  Communication      Cognition Arousal/Alertness: Awake/alert Behavior During Therapy: WFL for tasks assessed/performed Overall Cognitive Status: Within Functional Limits for tasks assessed                                          General Comments      Exercises     Assessment/Plan    PT Assessment Patient does not need any further PT services  PT Problem List          PT Treatment Interventions      PT Goals (Current goals can be found in the Care Plan section)  Acute Rehab PT Goals PT Goal Formulation: All assessment and education complete, DC therapy    Frequency       Co-evaluation               AM-PAC PT "6 Clicks" Mobility  Outcome Measure Help needed turning from your back to your side while in a flat bed without using bedrails?: A Little Help needed moving from lying on your back to sitting on the side of a flat bed without using bedrails?: A Little Help needed moving to and from a bed to a chair (including a wheelchair)?: None Help needed standing up from a chair using your arms (e.g., wheelchair or bedside chair)?: None Help needed to walk in hospital room?: None Help needed climbing 3-5 steps with a railing? : A Little 6 Click Score: 21    End of Session   Activity Tolerance: Patient tolerated treatment well Patient left: in chair;with call bell/phone within reach Nurse Communication: Mobility status PT Visit Diagnosis: Other abnormalities of gait and mobility (R26.89)    Time: 1157-2620 PT Time Calculation (min) (ACUTE ONLY): 21 min   Charges:   PT Evaluation $PT Eval Moderate Complexity: 1 Mod          [place], PT Acute Rehabilitation Services Office: [place] 10/16/2022, 1:19 PM

## 2022-10-16 NOTE — Progress Notes (Signed)
Mobility Specialist Progress Note:   10/16/22 0851  Mobility  Activity Ambulated with assistance in hallway  Level of Assistance Standby assist, set-up cues, supervision of patient - no hands on  Assistive Device Front wheel walker  Distance Ambulated (ft) 450 ft  Activity Response Tolerated well  Mobility Referral Yes  $Mobility charge 1 Mobility   Pt received in chair willing to participate in mobility. No complaints of pain. Left in chair with call bell in reach and all needs met.   [facility] Surveyor, mining Chat only

## 2022-10-16 NOTE — Telephone Encounter (Signed)
Pharmacy Patient Advocate Encounter  Insurance verification completed.    The patient is insured through [employer] Medicare Part D   The patient is currently admitted and ran test claims for the following: Farxiga, Jardiance.  Copays and coinsurance results were relayed to Inpatient clinical team.  

## 2022-10-16 NOTE — Progress Notes (Signed)
Orthopedic Tech Progress Note Patient Details:  [place] Krist 12-10-1956 [id number]  Ortho Devices Type of Ortho Device: Postop shoe/boot Ortho Device/Splint Location: LLE Ortho Device/Splint Interventions: Ordered, Application, Removal   Post Interventions Patient Tolerated: Well Instructions Provided: Care of device  Janit Pagan 10/16/2022, 12:38 PM

## 2022-10-16 NOTE — Progress Notes (Signed)
CARDIAC REHAB PHASE I     Pt sitting on side of bed feeling well today. Ambulating in halls tolerating well. Walked this morning with mobility. Plans to walk again after lunch. Post OHS home education including site care, restrictions, heart healthy diabetic diet, move in the tub, sternal precautions, exercise guidelines, risk factors, home needs at discharge, IS use at home and CRP2 reviewed. All questions and concerns addressed. Pt has DME  at home. Will refer to AP for CRP2. Will return to offer walk later today as time permits. Will continue to follow.    [id number]  Vanessa Barbara, RN BSN 10/16/2022 10:54 AM

## 2022-10-16 NOTE — Telephone Encounter (Signed)
Taken care of. Thanks, DR. Amalia Hailey

## 2022-10-16 NOTE — Progress Notes (Signed)
Rounding Note    Patient Name: Amanda Mendez Date of Encounter: 10/16/2022  [place] Cardiologist: Minus Breeding, MD   Subjective   Doing well overall, pain well controlled. Reviewed medications. Discussed SGLT2i. She was on invokana remotely, had yeast infections. Dr. Percival Spanish wanted her to be on jardiance or farxiga, but these were very expensive and she could not afford. Discussed options today.  Inpatient Medications    Scheduled Meds:  acetaminophen  1,000 mg Oral Q6H   Or   acetaminophen (TYLENOL) oral liquid 160 mg/5 mL  1,000 mg Per Tube Q6H   aspirin EC  325 mg Oral Daily   DULoxetine  30 mg Oral Daily   enoxaparin (LOVENOX) injection  40 mg Subcutaneous QHS   ezetimibe  10 mg Oral Daily   furosemide  40 mg Oral Daily   glimepiride  2 mg Oral BID   insulin aspart  0-15 Units Subcutaneous TID WC   insulin aspart  4 Units Subcutaneous TID WC   losartan  100 mg Oral Daily   metoprolol tartrate  25 mg Oral BID   pantoprazole  40 mg Oral Daily   potassium chloride  20 mEq Oral Daily   rosuvastatin  40 mg Oral Daily   sodium chloride flush  3 mL Intravenous Q12H   Continuous Infusions:  sodium chloride     PRN Meds: sodium chloride, ondansetron **OR** ondansetron (ZOFRAN) IV, mouth rinse, oxyCODONE, phenol, senna-docusate, sodium chloride flush, traMADol   Vital Signs    Vitals:   10/15/22 1956 10/15/22 2345 10/16/22 0419 10/16/22 0805  BP: (!) 122/52 (!) 145/58 (!) 141/61 134/60  Pulse: 78 74 73 79  Resp: 20 (!) _0 Temp: 97.7 F (36.5 C) 98 F (36.7 C) 98 F (36.7 C) 97.7 F (36.5 C)  TempSrc: Oral Oral Oral Oral  SpO2: 92% 93% 96% 94%  Weight:   86.3 kg   Height:        Intake/Output Summary (Last 24 hours) at 10/16/2022 0937 Last data filed at 10/16/2022 0806 Gross per 24 hour  Intake 960 ml  Output --  Net 960 ml      10/16/2022    4:19 AM 10/15/2022    5:15 AM 10/14/2022    5:15 AM  Last 3 Weights  Weight (lbs) 190 lb  3.2 oz 188 lb 15 oz 192 lb 0.3 oz  Weight (kg) 86.274 kg 85.7 kg 87.1 kg      Telemetry    SR - Personally Reviewed  ECG    No new - Personally Reviewed  Physical Exam   GEN: Well nourished, well developed in no acute distress NECK: No JVD CARDIAC: regular rhythm, normal S1 and S2, no rubs or gallops. No murmur. VASCULAR: Radial pulses 2+ bilaterally.  RESPIRATORY:  Clear to auscultation without rales, wheezing or rhonchi, slightly diminished at bases ABDOMEN: Soft, non-tender, non-distended MUSCULOSKELETAL:  Moves all 4 limbs independently, LLE wrapped SKIN: Warm and dry, trivial edema NEUROLOGIC:  No focal neuro deficits noted. PSYCHIATRIC:  Normal affect    Labs    High Sensitivity Troponin:   Recent Labs  Lab 10/02/22 1529 10/02/22 1710 10/03/22 0158  TROPONINIHS 22* 22* 21*     Chemistry Recent Labs  Lab 10/12/22 0113 10/12/22 0807 10/12/22 1834 10/12/22 2247 10/13/22 0257 10/13/22 1657 10/14/22 0313 10/15/22 0320 10/16/22 0128  NA 136   < > 138   < > 139 138 139 140 139  K 4.2   < >  4.5   < > 4.1 4.3 3.7 3.7 4.3  CL 104   < > 107  --  108 102 105 101 102  CO2 24  --  23  --  _0 GLUCOSE 253*   < > 168*  --  118* 263* 71 79 205*  BUN 32*   < > 24*  --  22 25* 24* 24* 31*  CREATININE 1.61*   < > 1.18*  --  1.32* 1.61* 1.56* 1.51* 1.71*  CALCIUM 9.3  --  7.9*  --  8.2* 8.5* 8.5* 8.9 8.8*  MG 1.8  --  3.1*  --  2.5* 2.4  --   --   --   PROT 6.7  --   --   --   --   --   --   --   --   ALBUMIN 3.3*  --   --   --   --   --   --   --   --   AST 16  --   --   --   --   --   --   --   --   ALT 24  --   --   --   --   --   --   --   --   ALKPHOS 91  --   --   --   --   --   --   --   --   BILITOT 0.6  --   --   --   --   --   --   --   --   GFRNONAA 35*  --  51*  --  44* 35* 36* 38* 32*  ANIONGAP 8  --  8  --  _1 < > = values in this interval not displayed.    Lipids No results for input(s): "CHOL", "TRIG", "HDL", "LABVLDL",  "LDLCALC", "CHOLHDL" in the last 168 hours.  Hematology Recent Labs  Lab 10/13/22 1657 10/14/22 0313 10/15/22 0320  WBC 15.6* 14.9* 14.0*  RBC 3.40* 3.34* 3.33*  HGB 10.6* 10.4* 10.2*  HCT 31.8* 32.2* 32.1*  MCV 93.5 96.4 96.4  MCH 31.2 31.1 30.6  MCHC 33.3 32.3 31.8  RDW 14.5 14.3 14.0  PLT 200 213 241   Thyroid No results for input(s): "TSH", "FREET4" in the last 168 hours.  BNPNo results for input(s): "BNP", "PROBNP" in the last 168 hours.  DDimer No results for input(s): "DDIMER" in the last 168 hours.   Radiology    No results found.  Cardiac Studies   Cath 10/03/22   1st Mrg lesion is 10% stenosed.   1.  Severe multivessel disease consisting of chronic total occlusion of right coronary artery (which has a high anterior takeoff), proximal LAD, and first diagonal disease. 2.  Patent first obtuse marginal stent. 3.  LVEDP of 26 mmHg.   Recommendation: Cardiothoracic surgery consultation; results discussed with Dr. Harl Bowie.  Echo 10/03/22  1. Left ventricular ejection fraction, by estimation, is 70 to 75%. The  left ventricle has hyperdynamic function. The left ventricle has no  regional wall motion abnormalities. There is mild concentric left  ventricular hypertrophy. Left ventricular  diastolic parameters are consistent with Grade I diastolic dysfunction  (impaired relaxation).   2. Right ventricular systolic function is normal. The right ventricular  size is normal.   3. The mitral valve is normal in structure.  No evidence of mitral valve  regurgitation. No evidence of mitral stenosis.   4. The aortic valve is normal in structure. Aortic valve regurgitation is  not visualized. No aortic stenosis is present.   5. The inferior vena cava is normal in size with greater than 50%  respiratory variability, suggesting right atrial pressure of 3 mmHg.   Patient Profile     66 y.o. female with CAD, hypertension, type II diabetes presenting with chest pain, now s/p  CABG.  Assessment & Plan    CAD S/P CABG x 3 (10/12/22, Dr. Cyndia Bent) LIMA-LAD, SVG-diag, SVG-RCA -recovering well post CABG -continue aspirin, rosuvastatin, ezetimibe -continue beta blocker  Hypertension -continue metoprolol, losartan  Dry gangrene, left 5th toe -followed by podiatry  Type 2 diabetes with chronic kidney disease stage 3a Chronic diastolic heart failure -continue lasix -will aim for SGLT2i this admission, will ask for pharmacy to run copay costs. Updated: copay is $47 once her deductible is met (still has $420 left on her deductible). May be expensive again at the start of the year, will need to reassess.  For questions or updates, please contact [place] Please consult [email or web address] for contact info under     Signed, Buford Dresser, MD  10/16/2022, 9:37 AM

## 2022-10-16 NOTE — TOC Benefit Eligibility Note (Signed)
Patient Teacher, English as a foreign language completed.    The patient is currently admitted and upon discharge could be taking Farxiga 10 mg.  The current 30 day co-pay is $467.00 due to a $420.00 deductible.  Will be $47.00 once deductible is met.   The patient is currently admitted and upon discharge could be taking Jardiance 10 mg.  The current 30 day co-pay is $467.00 due to a $420.00 deductible.  Will be $47.00 once deductible is met.   The patient is insured through [place], [place] Patient Advocate Specialist [place] Patient Advocate Team Direct Number: [phone]  Fax: [phone]

## 2022-10-17 ENCOUNTER — Inpatient Hospital Stay (HOSPITAL_COMMUNITY): Payer: Medicare Other

## 2022-10-17 DIAGNOSIS — Z794 Long term (current) use of insulin: Secondary | ICD-10-CM

## 2022-10-17 DIAGNOSIS — I10 Essential (primary) hypertension: Secondary | ICD-10-CM | POA: Diagnosis not present

## 2022-10-17 DIAGNOSIS — Z951 Presence of aortocoronary bypass graft: Secondary | ICD-10-CM | POA: Diagnosis not present

## 2022-10-17 DIAGNOSIS — I2510 Atherosclerotic heart disease of native coronary artery without angina pectoris: Secondary | ICD-10-CM | POA: Diagnosis not present

## 2022-10-17 DIAGNOSIS — E118 Type 2 diabetes mellitus with unspecified complications: Secondary | ICD-10-CM | POA: Diagnosis not present

## 2022-10-17 LAB — GLUCOSE, CAPILLARY
Glucose-Capillary: 175 mg/dL — ABNORMAL HIGH (ref 70–99)
Glucose-Capillary: 261 mg/dL — ABNORMAL HIGH (ref 70–99)
Glucose-Capillary: 213 mg/dL — ABNORMAL HIGH (ref 70–99)
Glucose-Capillary: 256 mg/dL — ABNORMAL HIGH (ref 70–99)

## 2022-10-17 LAB — BASIC METABOLIC PANEL
Anion gap: 15 (ref 5–15)
BUN: 32 mg/dL — ABNORMAL HIGH (ref 8–23)
CO2: 18 mmol/L — ABNORMAL LOW (ref 22–32)
Calcium: 8.9 mg/dL (ref 8.9–10.3)
Chloride: 105 mmol/L (ref 98–111)
Creatinine, Ser: 1.48 mg/dL — ABNORMAL HIGH (ref 0.44–1.00)
GFR, Estimated: 39 mL/min — ABNORMAL LOW (ref >60)
Glucose, Bld: 196 mg/dL — ABNORMAL HIGH (ref 70–99)
Potassium: 5.3 mmol/L — ABNORMAL HIGH (ref 3.5–5.1)
Sodium: 138 mmol/L (ref 135–145)

## 2022-10-17 MED ORDER — SORBITOL 70 % SOLN
30.0000 mL | Freq: Once | Status: DC
  Filled 2022-10-17: qty 30

## 2022-10-17 MED ORDER — INSULIN ASPART 100 UNIT/ML IJ SOLN
6.0000 [IU] | Freq: Three times a day (TID) | INTRAMUSCULAR | Status: DC
  Administered 2022-10-17 (×2): 6 [IU] via SUBCUTANEOUS

## 2022-10-17 MED ORDER — EMPAGLIFLOZIN 10 MG PO TABS
10.0000 mg | ORAL_TABLET | Freq: Every day | ORAL | Status: DC
  Administered 2022-10-17 – 2022-10-18 (×2): 10 mg via ORAL
  Filled 2022-10-17 (×2): qty 1

## 2022-10-17 MED ORDER — INSULIN ASPART 100 UNIT/ML IJ SOLN
0.0000 [IU] | Freq: Three times a day (TID) | INTRAMUSCULAR | Status: DC
  Administered 2022-10-17: 8 [IU] via SUBCUTANEOUS
  Administered 2022-10-17: 5 [IU] via SUBCUTANEOUS
  Administered 2022-10-17: 8 [IU] via SUBCUTANEOUS
  Administered 2022-10-18: 3 [IU] via SUBCUTANEOUS

## 2022-10-17 NOTE — Plan of Care (Signed)
  Problem: Nutritional: Goal: Maintenance of adequate nutrition will improve Outcome: Progressing   Problem: Tissue Perfusion: Goal: Adequacy of tissue perfusion will improve Outcome: Progressing   Problem: Activity: Goal: Ability to return to baseline activity level will improve Outcome: Progressing

## 2022-10-17 NOTE — Progress Notes (Signed)
Rounding Note    Patient Name: Amanda Mendez Date of Encounter: 10/17/2022  [place] Cardiologist: Minus Breeding, MD   Subjective   Continues to improved. Does not think she will have enough help at home immediately post discharge, seeing if she qualifies for rehab stay. Reviewed SLGT2i costs today. She has a reported itching allergy to [place], but she does not recall ever being on this. She did have a yeast infection on Invokana in the past. Her son takes [place] and she is amenable to trialing this.  Inpatient Medications    Scheduled Meds:  acetaminophen  1,000 mg Oral Q6H   Or   acetaminophen (TYLENOL) oral liquid 160 mg/5 mL  1,000 mg Per Tube Q6H   aspirin EC  325 mg Oral Daily   DULoxetine  30 mg Oral Daily   enoxaparin (LOVENOX) injection  40 mg Subcutaneous QHS   ezetimibe  10 mg Oral Daily   glimepiride  2 mg Oral BID   insulin aspart  0-15 Units Subcutaneous TID WC   insulin aspart  4 Units Subcutaneous TID WC   losartan  100 mg Oral Daily   metoprolol tartrate  25 mg Oral BID   pantoprazole  40 mg Oral Daily   potassium chloride  20 mEq Oral Daily   rosuvastatin  40 mg Oral Daily   sodium chloride flush  3 mL Intravenous Q12H   torsemide  40 mg Oral Daily   Continuous Infusions:  sodium chloride     PRN Meds: sodium chloride, ondansetron **OR** ondansetron (ZOFRAN) IV, mouth rinse, oxyCODONE, phenol, senna-docusate, sodium chloride flush, traMADol   Vital Signs    Vitals:   10/16/22 1312 10/16/22 2029 10/16/22 2329 10/17/22 0531  BP:  (!) 150/68 139/66   Pulse: 84 74 65   Resp:  20 20   Temp:  97.7 F (36.5 C) 97.6 F (36.4 C)   TempSrc:  Oral Oral   SpO2:  92% 100%   Weight:    85.2 kg  Height:        Intake/Output Summary (Last 24 hours) at 10/17/2022 0803 Last data filed at 10/17/2022 0538 Gross per 24 hour  Intake 240 ml  Output 1000 ml  Net -760 ml      10/17/2022    5:31 AM 10/16/2022    4:19 AM 10/15/2022    5:15 AM   Last 3 Weights  Weight (lbs) 187 lb 12.8 oz 190 lb 3.2 oz 188 lb 15 oz  Weight (kg) 85.186 kg 86.274 kg 85.7 kg      Telemetry    SR - Personally Reviewed  ECG    No new - Personally Reviewed  Physical Exam   GEN: Well nourished, well developed in no acute distress NECK: No JVD CARDIAC: regular rhythm, normal S1 and S2, no rubs or gallops. No murmur. VASCULAR: Radial pulses 2+ bilaterally.  RESPIRATORY:  Clear to auscultation without rales, wheezing or rhonchi in upper fields, slightly diminished at bilateral bases ABDOMEN: Soft, non-tender, non-distended MUSCULOSKELETAL:  Moves all 4 limbs independently SKIN: Warm and dry, trivial LE edema, LLE wrapped with protective shoe NEUROLOGIC:  No focal neuro deficits noted. PSYCHIATRIC:  Normal affect    Labs    High Sensitivity Troponin:   Recent Labs  Lab 10/02/22 1529 10/02/22 1710 10/03/22 0158  TROPONINIHS 22* 22* 21*     Chemistry Recent Labs  Lab 10/12/22 0113 10/12/22 8657 10/12/22 1834 10/12/22 2247 10/13/22 0257 10/13/22 1657 10/14/22 0313 10/15/22 0320  10/16/22 0128 10/17/22 0145  NA 136   < > 138   < > 139 138   < > 140 139 138  K 4.2   < > 4.5   < > 4.1 4.3   < > 3.7 4.3 5.3*  CL 104   < > 107  --  108 102   < > 101 102 105  CO2 24  --  23  --  24 24   < > 28 28 18*  GLUCOSE 253*   < > 168*  --  118* 263*   < > 79 205* 196*  BUN 32*   < > 24*  --  22 25*   < > 24* 31* 32*  CREATININE 1.61*   < > 1.18*  --  1.32* 1.61*   < > 1.51* 1.71* 1.48*  CALCIUM 9.3  --  7.9*  --  8.2* 8.5*   < > 8.9 8.8* 8.9  MG 1.8  --  3.1*  --  2.5* 2.4  --   --   --   --   PROT 6.7  --   --   --   --   --   --   --   --   --   ALBUMIN 3.3*  --   --   --   --   --   --   --   --   --   AST 16  --   --   --   --   --   --   --   --   --   ALT 24  --   --   --   --   --   --   --   --   --   ALKPHOS 91  --   --   --   --   --   --   --   --   --   BILITOT 0.6  --   --   --   --   --   --   --   --   --   GFRNONAA 35*  --   51*  --  44* 35*   < > 38* 32* 39*  ANIONGAP 8  --  8  --  7 12   < > '11 9 15   '$ < > = values in this interval not displayed.    Lipids No results for input(s): "CHOL", "TRIG", "HDL", "LABVLDL", "LDLCALC", "CHOLHDL" in the last 168 hours.  Hematology Recent Labs  Lab 10/13/22 1657 10/14/22 0313 10/15/22 0320  WBC 15.6* 14.9* 14.0*  RBC 3.40* 3.34* 3.33*  HGB 10.6* 10.4* 10.2*  HCT 31.8* 32.2* 32.1*  MCV 93.5 96.4 96.4  MCH 31.2 31.1 30.6  MCHC 33.3 32.3 31.8  RDW 14.5 14.3 14.0  PLT 200 213 241   Thyroid No results for input(s): "TSH", "FREET4" in the last 168 hours.  BNPNo results for input(s): "BNP", "PROBNP" in the last 168 hours.  DDimer No results for input(s): "DDIMER" in the last 168 hours.   Radiology    No results found.  Cardiac Studies   Cath 10/03/22   1st Mrg lesion is 10% stenosed.   1.  Severe multivessel disease consisting of chronic total occlusion of right coronary artery (which has a high anterior takeoff), proximal LAD, and first diagonal disease. 2.  Patent first obtuse marginal stent. 3.  LVEDP of 26 mmHg.   Recommendation:  Cardiothoracic surgery consultation; results discussed with Dr. Harl Bowie.  Echo 10/03/22  1. Left ventricular ejection fraction, by estimation, is 70 to 75%. The  left ventricle has hyperdynamic function. The left ventricle has no  regional wall motion abnormalities. There is mild concentric left  ventricular hypertrophy. Left ventricular  diastolic parameters are consistent with Grade I diastolic dysfunction  (impaired relaxation).   2. Right ventricular systolic function is normal. The right ventricular  size is normal.   3. The mitral valve is normal in structure. No evidence of mitral valve  regurgitation. No evidence of mitral stenosis.   4. The aortic valve is normal in structure. Aortic valve regurgitation is  not visualized. No aortic stenosis is present.   5. The inferior vena cava is normal in size with greater than  50%  respiratory variability, suggesting right atrial pressure of 3 mmHg.   Patient Profile     66 y.o. female with CAD, hypertension, type II diabetes presenting with chest pain, now s/p CABG.  Assessment & Plan    CAD S/P CABG x 3 (10/12/22, Dr. Cyndia Bent) LIMA-LAD, SVG-diag, SVG-RCA -recovering well post CABG -continue aspirin, rosuvastatin, ezetimibe -continue beta blocker  Hypertension -continue metoprolol, losartan  Dry gangrene, left 5th toe -followed by podiatry  Type 2 diabetes with chronic kidney disease stage 3a Chronic diastolic heart failure -continue lasix -discussed SGLT2i. She feels that copay of $47 might be manageable. She does not recall being on Farxiga (dapagliflozin) before but did have a yeast infection on Invokana before (appears she was on this in 2016, changed to [place] after). Will trial Jardiance, discussed potential side effects, she will watch for these. -blood glucose has been elevated but improving. Would continue to monitor, may be able to scale back on amaryl or lantus if control improves post discharge  [place] will sign off.   Medication Recommendations:   -Continue aspirin -Started Jardiance, continue at discharge -Continue ezetimibe, rosuvastatin, metoprolol, losartan -Started on torsemide. Defer to surgical team, suspect she will not need this long term (especially with jardiance) but is still slightly volume up post op Other recommendations (labs, testing, etc):  none Follow up as an outpatient:  She has an appt with Dr. Percival Spanish on 11/09/22 at the [facility] office   For questions or updates, please contact [place] Please consult [email or web address] for contact info under     Signed, Buford Dresser, MD  10/17/2022, 8:03 AM

## 2022-10-17 NOTE — Progress Notes (Signed)
[address]       [place] [address]             [phone]      5 Days Post-Op Procedure(s) (LRB): CORONARY ARTERY BYPASS GRAFTING (CABG) TIMES THREE USING LEFT INTERNAL MAMMARY ARTERY AND RIGHT LEG GREAT SAPHENOUS VEIN HARVESTED ENDOSCOPICALLY (N/A) TRANSESOPHAGEAL ECHOCARDIOGRAM (TEE) (N/A) Subjective:  Sitting up on side of bed. Feels she is progressing well, has no complaints. Said she is concerned about the plan for returning to home at discharge as she does not have any family members that can stay with her full time.   Walked in the hall yesterday.   Passing gas, no BM yet. On RA  Objective: Vital signs in last 24 hours: Temp:  [97.6 F (36.4 C)-97.8 F (36.6 C)] 97.8 F (36.6 C) (11/08 0818) Pulse Rate:  [65-84] 80 (11/08 0818) Cardiac Rhythm: Normal sinus rhythm (11/07 1900) Resp:  [14-25] 25 (11/08 0818) BP: (136-150)/(61-68) 136/63 (11/08 0818) SpO2:  [92 %-100 %] 98 % (11/08 0818) Weight:  [85.2 kg] 85.2 kg (11/08 0531)    Intake/Output from previous day: 11/07 0701 - 11/08 0700 In: 240 [P.O.:240] Out: 1000 [Urine:1000] Intake/Output this shift: Total I/O In: 240 [P.O.:240] Out: -   General appearance: alert and cooperative Neurologic: intact Heart: regular rate and rhythm Lungs: clear to auscultation bilaterally Extremities: Minimal LE edema Wound: the sternotomy incision and RLE EVH incision are intact and dry.  Lab Results: Recent Labs    10/15/22 0320  WBC 14.0*  HGB 10.2*  HCT 32.1*  PLT 241    BMET:  Recent Labs    10/16/22 0128 10/17/22 0145  NA 139 138  K 4.3 5.3*  CL 102 105  CO2 28 18*  GLUCOSE 205* 196*  BUN 31* 32*  CREATININE 1.71* 1.48*  CALCIUM 8.8* 8.9     PT/INR:  No results for input(s): "LABPROT", "INR" in the last 72 hours.  ABG    Component Value Date/Time   PHART 7.336 (L) 10/12/2022 2346   HCO3 22.6 10/12/2022 2346   TCO2 24 10/12/2022 2346   ACIDBASEDEF 3.0 (H) 10/12/2022  2346   O2SAT 94 10/12/2022 2346   CBG (last 3)  Recent Labs    10/16/22 1657 10/16/22 2139 10/17/22 0608  GLUCAP 240* 188* 175*     Assessment/Plan: S/P Procedure(s) (LRB): CORONARY ARTERY BYPASS GRAFTING (CABG) TIMES THREE USING LEFT INTERNAL MAMMARY ARTERY AND RIGHT LEG GREAT SAPHENOUS VEIN HARVESTED ENDOSCOPICALLY (N/A) TRANSESOPHAGEAL ECHOCARDIOGRAM (TEE) (N/A)  -POD 5 CABG x 3. Pre-op EF 70%.Stable VS and cardiac rhythm. On ASA, statin, BB. D/C pacer wires.   -H/O HTN- Good control. Continue Lopressor and Cozaar.  -ENDO-Type 2 DM: CBG's 180-250.  Continue Amaryl and SSI and incresasing the meal coverage to 6 unitsTID as recommended by Diabetes Coordinator.  Jardiance starting today per cardiology.  -RENAL- Stage 3 CKD with creat near baseline. K+ 5.3, not getting any potassium supplement. Has volume excess with Wt about 10lbs+. Continue diuresis with Demadex.  Monitor BMP  GI- tolerating PO;s but no BM yet. She is not uncomfortable. Sorbitol today.   -Gangrene left 5 toe- S/P amputation on 10/28 by Dr. Amalia Hailey. Dressing dry, has ortho boot now.  -Disposition- we have been planning for eventual discharge to home. PT and OT recommending discharge to home with [facility] OT and BSC.  Ms. Klapper told us this morning that she does not have adequate help at home and will need to  go to a SNF at discharge.  TOC team to discuss planning and options with patient today.    LOS: 15 days    Antony Odea, [place] [phone] 10/17/2022

## 2022-10-17 NOTE — TOC Initial Note (Signed)
Transition of Care  [facility]) - Initial/Assessment Note    Patient Details  Name: Amanda Mendez MRN: [id number] Date of Birth: 1956/05/05  Transition of Care [facility]) CM/SW Contact:    Vinie Sill, LCSW Phone Number: 10/17/2022, 12:29 PM  Clinical Narrative:                 CSW met with patient. CSW introduced self and explained role. CSW completed SDOH assessment and discussed possible resources in [facility]. Patient reports no other questions or concerns at this time.   CSW discussed PT recommendation- informed, unfortunately, insurance will not approve for short term rehab she does not meet criteria for skilled level of care.   Clinical Social Worker will sign off for now as social work intervention is no longer needed. Please consult [place] if new need arises.    Thurmond Butts, LCSW Clinical Social Worker   Expected Discharge Plan: [place]     Patient Goals and CMS Choice        Expected Discharge Plan and Services Expected Discharge Plan: [place]                                              Prior Living Arrangements/Services   Lives with:: Adult Children, Self, Minor Children                   Activities of Daily Living Home Assistive Devices/Equipment: Cane (specify quad or straight), Bedside commode/3-in-1, Shower chair with back ADL Screening (condition at time of admission) Patient's cognitive ability adequate to safely complete daily activities?: Yes Is the patient deaf or have difficulty hearing?: No Does the patient have difficulty seeing, even when wearing glasses/contacts?: No (had blurry vision, got new glasses and blurriness went away) Does the patient have difficulty concentrating, remembering, or making decisions?: Yes (pt states she forgets things) Patient able to express need for assistance with ADLs?: Yes Does the patient have difficulty dressing or bathing?: No Independently  performs ADLs?: Yes (appropriate for developmental age) Does the patient have difficulty walking or climbing stairs?: No Weakness of Legs: Both Weakness of Arms/Hands: Both  Permission Sought/Granted                  Emotional Assessment Appearance:: Appears stated age Attitude/Demeanor/Rapport: Engaged Affect (typically observed): Pleasant Orientation: : Oriented to Self, Oriented to Place, Oriented to  Time, Oriented to Situation Alcohol / Substance Use: Not Applicable Psych Involvement: No (comment)  Admission diagnosis:  Unstable angina ([place]) [I20.0] Patient Active Problem List   Diagnosis Date Noted   S/P CABG x 3 10/12/2022   Unstable angina (H[facility])    Abnormal cardiovascular stress test    Elevated troponin 10/02/2022   Chronic kidney disease, stage 3a ([place]) 10/02/2022   Chronic diastolic HF (heart failure) ([place]) 09/26/2021   Fall 09/10/2021   Left leg pain 09/10/2021   Need for 23-polyvalent pneumococcal polysaccharide vaccine 09/25/2016   Need for immunization against influenza 09/25/2016   Gastroesophageal reflux disease without esophagitis 06/25/2016   Dyslipidemia (high LDL; low HDL) 03/26/2016   Vitamin D deficiency 06/16/2015   Obesity (BMI 30-39.9) 03/24/2015   Gastroenteritis, infectious 05/29/2014   Abdominal pain 05/28/2014   HLD (hyperlipidemia) 05/28/2014   CAD in native artery 09/03/2012   Type 2 diabetes mellitus with stage 3a chronic kidney disease, without long-term  current use of insulin ([place]) 08/14/2012   Essential (primary) hypertension 08/14/2012   Chest pain 06/30/8287   Acute diastolic heart failure ([place]) 08/13/2012   PCP:  Redmond School, MD Pharmacy:   [facility] [address] [place] [address] [place] [address] Phone: [phone] Fax: [phone]  [id number] [id number]: [phone]:  [phone]    Social Determinants of Health (SDOH) Interventions    Readmission Risk Interventions     No data to display

## 2022-10-17 NOTE — Progress Notes (Signed)
CARDIAC REHAB PHASE I   PRE:  Rate/Rhythm: 22 SR  BP:  Sitting: 154/78      SaO2: 99  MODE:  Ambulation: 450 ft   POST:  Rate/Rhythm: 91 SR  BP:  Sitting: 153/65      SaO2: 99 RA  Pt ambulated in hall using front wheel walker with steady gait stand by assist only. Returned to room to chair with call bell and bedside table in reach. Encouraged IS use and continued ambulation. Will continue to follow.   [phone]  Vanessa Barbara, RN BSN 10/17/2022 10:33 AM

## 2022-10-17 NOTE — Progress Notes (Signed)
Mobility Specialist Progress Note:   10/17/22 1434  Mobility  Activity Ambulated with assistance in hallway  Level of Assistance Standby assist, set-up cues, supervision of patient - no hands on  Assistive Device Front wheel walker  Distance Ambulated (ft) 300 ft  Activity Response Tolerated well  $Mobility charge 1 Mobility   Pt received in bed willing in participate in mobility. No complaints of pain. Left in bed with call bell in reach and all needs met.   [facility] Surveyor, mining Chat only

## 2022-10-17 NOTE — Progress Notes (Signed)
Patient EPW removed as ordered, all ends intact. Patient reminded to lie supine approximately one hour. Bp 157/62 hr 71 on monitor call bell with in reach.Destiny Trickey, Bettina Gavia RN

## 2022-10-18 LAB — BASIC METABOLIC PANEL
Anion gap: 12 (ref 5–15)
BUN: 34 mg/dL — ABNORMAL HIGH (ref 8–23)
CO2: 28 mmol/L (ref 22–32)
Calcium: 9.3 mg/dL (ref 8.9–10.3)
Chloride: 96 mmol/L — ABNORMAL LOW (ref 98–111)
Creatinine, Ser: 1.68 mg/dL — ABNORMAL HIGH (ref 0.44–1.00)
GFR, Estimated: 33 mL/min — ABNORMAL LOW (ref >60)
Glucose, Bld: 375 mg/dL — ABNORMAL HIGH (ref 70–99)
Potassium: 3.8 mmol/L (ref 3.5–5.1)
Sodium: 136 mmol/L (ref 135–145)

## 2022-10-18 LAB — GLUCOSE, CAPILLARY: Glucose-Capillary: 184 mg/dL — ABNORMAL HIGH (ref 70–99)

## 2022-10-18 MED ORDER — TRAMADOL HCL 50 MG PO TABS: 50.0000 mg | ORAL_TABLET | Freq: Four times a day (QID) | ORAL | Status: DC | PRN

## 2022-10-18 MED ORDER — EMPAGLIFLOZIN 10 MG PO TABS: 10.0000 mg | ORAL_TABLET | Freq: Every day | ORAL | 2 refills | Status: DC

## 2022-10-18 MED ORDER — EZETIMIBE 10 MG PO TABS: 10.0000 mg | ORAL_TABLET | Freq: Every day | ORAL | 5 refills | Status: DC

## 2022-10-18 MED ORDER — ASPIRIN 325 MG PO TBEC: 325.0000 mg | DELAYED_RELEASE_TABLET | Freq: Every day | ORAL | 4 refills | Status: DC

## 2022-10-18 MED ORDER — TRAMADOL HCL 50 MG PO TABS: 50.0000 mg | ORAL_TABLET | Freq: Four times a day (QID) | ORAL | Status: AC | PRN

## 2022-10-18 MED FILL — Lidocaine HCl Local Soln Prefilled Syringe 100 MG/5ML (2%): INTRAMUSCULAR | Qty: 5 | Status: AC

## 2022-10-18 MED FILL — Albumin, Human Inj 5%: INTRAVENOUS | Qty: 250 | Status: AC

## 2022-10-18 MED FILL — Mannitol IV Soln 20%: INTRAVENOUS | Qty: 500 | Status: AC

## 2022-10-18 MED FILL — Electrolyte-R (PH 7.4) Solution: INTRAVENOUS | Qty: 3000 | Status: AC

## 2022-10-18 MED FILL — Sodium Chloride IV Soln 0.9%: INTRAVENOUS | Qty: 2000 | Status: AC

## 2022-10-18 NOTE — TOC Transition Note (Addendum)
Transition of Care [facility]) - CM/SW Discharge Note   Patient Details  Name: Amanda Mendez MRN: [id number] Date of Birth: 1956-11-07  [facility] CM/SW Contact:  Verdell Carmine, RN Phone Number: 10/18/2022, 11:06 AM   Clinical Narrative:     Patient discharging today. Has all equipment needed at home ( walker, cane, BSC). IS amenable to home health. Wants a agency that works well with her insurance. She understands that she will go to cardiac rehab most likely in 3 months.  Reached out to [facility] for acceptance. Orders are in system.  [facility] could not accept,  [facility] will send in for PT. Information added to AVS  Final next level of care: Home w Home Health Services Barriers to Discharge: No Barriers Identified   Patient Goals and CMS Choice   CMS Medicare.gov Compare Post Acute Care list provided to:: Patient Choice offered to / list presented to : Patient  Discharge Placement               Home with Home health        Discharge Plan and Services                DME Arranged:  (Has Walker, cane BSC at home no further needs)         HH Arranged: PT, OT HH Agency: [place] Date [place]: 10/18/22 Time [place] Agency Contacted: 81 Representative spoke with at [place]: Adela Lank  Social Determinants of Health (SDOH) Interventions     Readmission Risk Interventions     No data to display

## 2022-10-18 NOTE — Progress Notes (Signed)
Occupational Therapy Treatment Patient Details Name: Amanda Mendez MRN: [id number] DOB: Oct 22, 1956 Today's Date: 10/18/2022   History of present illness 66yo female admitted 10/24 with chest pain. 10/27 angiography due to left 5th toe gangrene. 10/28 Left 5th toe amputation. 11/3 CABGx 3. PMHx: CAD, HTN, T2DM   OT comments  Pt able to verbalize sternal precautions, but needing verbal cues to generalize with bed mobility. Pt donned post op shoe, ambulated to bathroom and sink with RW and supervision. Educated pt in energy conservation strategies and IADLs she is to avoid to adhere to sternal precautions. Pt is eager to go home today.    Recommendations for follow up therapy are one component of a multi-disciplinary discharge planning process, led by the attending physician.  Recommendations may be updated based on patient status, additional functional criteria and insurance authorization.    Follow Up Recommendations  Home health OT    Assistance Recommended at Discharge Intermittent Supervision/Assistance  Patient can return home with the following  A little help with walking and/or transfers;A little help with bathing/dressing/bathroom;Direct supervision/assist for medications management;Direct supervision/assist for financial management;Assist for transportation;Help with stairs or ramp for entrance   Equipment Recommendations  None recommended by OT    Recommendations for Other Services      Precautions / Restrictions Precautions Precautions: Sternal Precaution Booklet Issued: Yes (comment) Precaution Comments: pt able to state sternal precautions, cues to generalize Restrictions Weight Bearing Restrictions: Yes LLE Weight Bearing: Weight bearing as tolerated Other Position/Activity Restrictions: LLE post op shoe       Mobility Bed Mobility Overal bed mobility: Needs Assistance Bed Mobility: Rolling, Sidelying to Sit Rolling: Supervision Sidelying to sit: Supervision        General bed mobility comments: cues for precautions    Transfers Overall transfer level: Needs assistance Equipment used: Rolling walker (2 wheels) Transfers: Sit to/from Stand Sit to Stand: Supervision           General transfer comment: pt awaiting cane from home     Balance Overall balance assessment: Needs assistance   Sitting balance-Leahy Scale: Good     Standing balance support: During functional activity, Reliant on assistive device for balance Standing balance-Leahy Scale: Fair                             ADL either performed or assessed with clinical judgement   ADL Overall ADL's : Needs assistance/impaired     Grooming: Supervision/safety;Standing;Wash/dry hands               Lower Body Dressing: Set up;Sitting/lateral leans Lower Body Dressing Details (indicate cue type and reason): donned post op shoe Toilet Transfer: Supervision/safety;Rolling walker (2 wheels)   Toileting- Clothing Manipulation and Hygiene: Modified independent;Sit to/from stand       Functional mobility during ADLs: Supervision/safety;Rolling walker (2 wheels)      Extremity/Trunk Assessment              Vision       Perception     Praxis      Cognition Arousal/Alertness: Awake/alert Behavior During Therapy: WFL for tasks assessed/performed Overall Cognitive Status: Within Functional Limits for tasks assessed                                          Exercises      Shoulder Instructions  General Comments      Pertinent Vitals/ Pain       Pain Assessment Pain Assessment: No/denies pain  Home Living                                          Prior Functioning/Environment              Frequency  Min 2X/week        Progress Toward Goals  OT Goals(current goals can now be found in the care plan section)  Progress towards OT goals: Progressing toward goals  Acute Rehab OT Goals OT  Goal Formulation: With patient Time For Goal Achievement: 10/30/22 Potential to Achieve Goals: Good  Plan Discharge plan remains appropriate    Co-evaluation                 AM-PAC OT "6 Clicks" Daily Activity     Outcome Measure   Help from another person eating meals?: None Help from another person taking care of personal grooming?: A Little Help from another person toileting, which includes using toliet, bedpan, or urinal?: A Little Help from another person bathing (including washing, rinsing, drying)?: A Little Help from another person to put on and taking off regular upper body clothing?: None Help from another person to put on and taking off regular lower body clothing?: A Little 6 Click Score: 20    End of Session Equipment Utilized During Treatment: Rolling walker (2 wheels)  OT Visit Diagnosis: Unsteadiness on feet (R26.81);Other abnormalities of gait and mobility (R26.89);Muscle weakness (generalized) (M62.81)   Activity Tolerance Patient tolerated treatment well   Patient Left in bed;with call bell/phone within reach;with family/visitor present   Nurse Communication          Time: 1003-1016 OT Time Calculation (min): 13 min  Charges: OT General Charges $OT Visit: 1 Visit OT Treatments $Self Care/Home Management : 8-22 mins  Cleta Alberts, OTR/L Acute Rehabilitation Services Office: [phone]  Malka So 10/18/2022, 11:37 AM

## 2022-10-18 NOTE — Progress Notes (Signed)
Pt discharging home with family.  All instructions and prescriptions given and reviewed, follow up appts in place.  All questions answered.

## 2022-10-18 NOTE — Inpatient Diabetes Management (Addendum)
Inpatient Diabetes Program Recommendations  AACE/ADA: New Consensus Statement on Inpatient Glycemic Control (2015)  Target Ranges:  Prepandial:   less than 140 mg/dL      Peak postprandial:   less than 180 mg/dL (1-2 hours)      Critically ill patients:  140 - 180 mg/dL   Lab Results  Component Value Date   GLUCAP 184 (H) 10/18/2022   HGBA1C 11.3 (H) 10/03/2022    Review of Glycemic Control  Latest Reference Range & Units 10/17/22 06:08 10/17/22 11:11 10/17/22 16:51 10/17/22 21:03 10/18/22 06:18  Glucose-Capillary 70 - 99 mg/dL 175 (H) 261 (H) 213 (H) 256 (H) 184 (H)   Diabetes history: DM 2 Outpatient Diabetes medications: Amaryl 2 mg tid, Lantus (dose not listed in chart) Current orders for Inpatient glycemic control:  Novolog 0-15 units tid Novolog 6 units tid meal coverage Amaryl 2 mg BID Jardiance 10 mg Daily (started yesterday 11/8)  A1c 11.3% on 10/03/22  Inpatient Diabetes Program Recommendations:   If in the plan of care: -  Consider increasing Novolog meal coverage to 8 units tid if eating >50% of meals   Thanks,  Tama Headings RN, MSN, BC-ADM Inpatient Diabetes Coordinator Team Pager [phone] (8a-5p)

## 2022-10-18 NOTE — Progress Notes (Signed)
I was asked by my colleague to put discharge order in. We were awaiting BMET as were following potassium, which is decreased to 3.8.

## 2022-10-18 NOTE — Progress Notes (Signed)
CARDIAC REHAB PHASE I   Pt resting in bed feeling well today. States Doctor said I can go home today. Feeling ready for discharge home, looking forward to it. Reviewed home education no questions or concerns. Has all DME needed for home.   [phone] Vanessa Barbara, RN BSN 10/18/2022 10:21 AM

## 2022-10-18 NOTE — Progress Notes (Signed)
Mobility Specialist Progress Note:   10/18/22 1304  Mobility  Activity Ambulated independently to bathroom  Level of Assistance Independent after set-up  Assistive Device Front wheel walker  Distance Ambulated (ft) 30 ft  Activity Response Tolerated well  Mobility Referral Yes  $Mobility charge 1 Mobility   Pt in bathroom, able to have a BM. No complaints of pain. Left in chair with call bell in reach and all needs met.   Amanda Mendez Mobility Specialist Secure Chat only   

## 2022-10-18 NOTE — Progress Notes (Addendum)
[address]       [place] [address]             [phone]      6 Days Post-Op Procedure(s) (LRB): CORONARY ARTERY BYPASS GRAFTING (CABG) TIMES THREE USING LEFT INTERNAL MAMMARY ARTERY AND RIGHT LEG GREAT SAPHENOUS VEIN HARVESTED ENDOSCOPICALLY (N/A) TRANSESOPHAGEAL ECHOCARDIOGRAM (TEE) (N/A) Subjective:  Rested well last night and says she feels good. No new concerns.  Walked in the hall twice yesterday with minimal assistance required.  BM yesterday morning.  On RA  Objective: Vital signs in last 24 hours: Temp:  [97.6 F (36.4 C)-97.9 F (36.6 C)] 97.6 F (36.4 C) (11/09 0341) Pulse Rate:  [69-87] 78 (11/09 0341) Cardiac Rhythm: Normal sinus rhythm (11/08 2000) Resp:  [19-25] 20 (11/09 0341) BP: (136-157)/(62-77) 156/75 (11/09 0341) SpO2:  [93 %-99 %] 95 % (11/09 0341) Weight:  [82.5 kg] 82.5 kg (11/09 0347)    Intake/Output from previous day: 11/08 0701 - 11/09 0700 In: 720 [P.O.:720] Out: -  Intake/Output this shift: No intake/output data recorded.  General appearance: alert and cooperative Neurologic: intact Heart: regular rate and rhythm Lungs: clear to auscultation bilaterally Extremities: Minimal LE edema Wound: the sternotomy incision and RLE EVH incision are intact and dry.  Lab Results: No results for input(s): "WBC", "HGB", "HCT", "PLT" in the last 72 hours.  BMET:  Recent Labs    10/16/22 0128 10/17/22 0145  NA 139 138  K 4.3 5.3*  CL 102 105  CO2 28 18*  GLUCOSE 205* 196*  BUN 31* 32*  CREATININE 1.71* 1.48*  CALCIUM 8.8* 8.9     PT/INR:  No results for input(s): "LABPROT", "INR" in the last 72 hours.  ABG    Component Value Date/Time   PHART 7.336 (L) 10/12/2022 2346   HCO3 22.6 10/12/2022 2346   TCO2 24 10/12/2022 2346   ACIDBASEDEF 3.0 (H) 10/12/2022 2346   O2SAT 94 10/12/2022 2346   CBG (last 3)  Recent Labs    10/17/22 1651 10/17/22 2103 10/18/22 0618  GLUCAP 213* 256* 184*      Assessment/Plan: S/P Procedure(s) (LRB): CORONARY ARTERY BYPASS GRAFTING (CABG) TIMES THREE USING LEFT INTERNAL MAMMARY ARTERY AND RIGHT LEG GREAT SAPHENOUS VEIN HARVESTED ENDOSCOPICALLY (N/A) TRANSESOPHAGEAL ECHOCARDIOGRAM (TEE) (N/A)  -POD 6 CABG x 3. Pre-op EF 70%.Stable VS and cardiac rhythm. On ASA, statin, BB.   -H/O HTN- Reasonable control on Lopressor and Cozaar.  Will plan to increase to her pre-admission dose of Lopressor at discharge  ('50mg'$  BID)  -ENDO-Type 2 DM: CBG's 180-250. On Amaryl, Jardiance and SSI   -RENAL- Stage 3 CKD with creat near baseline. K+ 5.3, not getting any potassium supplement. Wt now near pre-op.  BMP this morning to f/u on K+.  GI- tolerating PO's, BM yesterday.  -Gangrene left 5 toe- S/P amputation on 10/28 by Dr. Amalia Hailey. Dressing dry, has ortho boot now.  -Disposition- we have been planning for eventual discharge to home. PT and OT recommending discharge to home with [facility] OT and BSC.  She requested discharge to SNF yesterday saying that the family members who live with her are all "busy". She admits this morning that someone would be available to her to help around the clock and she does not anticipate having any trouble getting her meals or navigating around her single-level home.  Will arrange for home health OT/ PT as recommended. She has a bathroom near her bed and doesn't think she will need a  BSC.  Possible discharge later today.    LOS: 16 days    Antony Odea, [place] [phone] 10/18/2022

## 2022-10-18 NOTE — Care Management Important Message (Signed)
Important Message  Patient Details  Name: Amanda Mendez MRN: [id number] Date of Birth: 12/13/1955   Medicare Important Message Given:  Yes     Shelda Altes 10/18/2022, 11:03 AM

## 2022-10-19 DIAGNOSIS — E1122 Type 2 diabetes mellitus with diabetic chronic kidney disease: Secondary | ICD-10-CM | POA: Diagnosis not present

## 2022-10-19 DIAGNOSIS — N1831 Chronic kidney disease, stage 3a: Secondary | ICD-10-CM | POA: Diagnosis not present

## 2022-10-19 DIAGNOSIS — N2581 Secondary hyperparathyroidism of renal origin: Secondary | ICD-10-CM | POA: Diagnosis not present

## 2022-10-19 DIAGNOSIS — D631 Anemia in chronic kidney disease: Secondary | ICD-10-CM | POA: Diagnosis not present

## 2022-10-19 DIAGNOSIS — I5033 Acute on chronic diastolic (congestive) heart failure: Secondary | ICD-10-CM | POA: Diagnosis not present

## 2022-10-19 DIAGNOSIS — I130 Hypertensive heart and chronic kidney disease with heart failure and stage 1 through stage 4 chronic kidney disease, or unspecified chronic kidney disease: Secondary | ICD-10-CM | POA: Diagnosis not present

## 2022-10-25 DIAGNOSIS — N1831 Chronic kidney disease, stage 3a: Secondary | ICD-10-CM | POA: Diagnosis not present

## 2022-10-25 DIAGNOSIS — I130 Hypertensive heart and chronic kidney disease with heart failure and stage 1 through stage 4 chronic kidney disease, or unspecified chronic kidney disease: Secondary | ICD-10-CM | POA: Diagnosis not present

## 2022-10-25 DIAGNOSIS — E1122 Type 2 diabetes mellitus with diabetic chronic kidney disease: Secondary | ICD-10-CM | POA: Diagnosis not present

## 2022-10-25 DIAGNOSIS — N2581 Secondary hyperparathyroidism of renal origin: Secondary | ICD-10-CM | POA: Diagnosis not present

## 2022-10-25 DIAGNOSIS — I5033 Acute on chronic diastolic (congestive) heart failure: Secondary | ICD-10-CM | POA: Diagnosis not present

## 2022-10-25 DIAGNOSIS — D631 Anemia in chronic kidney disease: Secondary | ICD-10-CM | POA: Diagnosis not present

## 2022-10-26 DIAGNOSIS — Z4802 Encounter for removal of sutures: Secondary | ICD-10-CM

## 2022-10-29 DIAGNOSIS — I5033 Acute on chronic diastolic (congestive) heart failure: Secondary | ICD-10-CM | POA: Diagnosis not present

## 2022-10-29 DIAGNOSIS — N2581 Secondary hyperparathyroidism of renal origin: Secondary | ICD-10-CM | POA: Diagnosis not present

## 2022-10-29 DIAGNOSIS — I130 Hypertensive heart and chronic kidney disease with heart failure and stage 1 through stage 4 chronic kidney disease, or unspecified chronic kidney disease: Secondary | ICD-10-CM | POA: Diagnosis not present

## 2022-10-29 DIAGNOSIS — E1122 Type 2 diabetes mellitus with diabetic chronic kidney disease: Secondary | ICD-10-CM | POA: Diagnosis not present

## 2022-10-29 DIAGNOSIS — N1831 Chronic kidney disease, stage 3a: Secondary | ICD-10-CM | POA: Diagnosis not present

## 2022-10-29 DIAGNOSIS — D631 Anemia in chronic kidney disease: Secondary | ICD-10-CM | POA: Diagnosis not present

## 2022-10-31 ENCOUNTER — Telehealth: Payer: Self-pay | Admitting: *Deleted

## 2022-10-31 NOTE — Telephone Encounter (Signed)
Patient is calling to see if she can stop wearing her Darco shoe(uncomfortable),replace it with a regular one. Suggested  that she should continue to wear until upcoming appointment but will sent message to physician for any other recommendations/instructions.

## 2022-11-05 ENCOUNTER — Ambulatory Visit: Payer: Medicare Other | Admitting: Cardiology

## 2022-11-05 NOTE — Telephone Encounter (Signed)
Called patient,no answer, left voice message giving physician's recommendations.

## 2022-11-05 NOTE — Telephone Encounter (Signed)
Yes, I'd rather wait until f/u appt. Thanks, Dr. Amalia Hailey

## 2022-11-06 DIAGNOSIS — E1122 Type 2 diabetes mellitus with diabetic chronic kidney disease: Secondary | ICD-10-CM | POA: Diagnosis not present

## 2022-11-06 DIAGNOSIS — N2581 Secondary hyperparathyroidism of renal origin: Secondary | ICD-10-CM | POA: Diagnosis not present

## 2022-11-06 DIAGNOSIS — I130 Hypertensive heart and chronic kidney disease with heart failure and stage 1 through stage 4 chronic kidney disease, or unspecified chronic kidney disease: Secondary | ICD-10-CM | POA: Diagnosis not present

## 2022-11-06 DIAGNOSIS — N1831 Chronic kidney disease, stage 3a: Secondary | ICD-10-CM | POA: Diagnosis not present

## 2022-11-06 DIAGNOSIS — D631 Anemia in chronic kidney disease: Secondary | ICD-10-CM | POA: Diagnosis not present

## 2022-11-06 DIAGNOSIS — I5033 Acute on chronic diastolic (congestive) heart failure: Secondary | ICD-10-CM | POA: Diagnosis not present

## 2022-11-07 DIAGNOSIS — I130 Hypertensive heart and chronic kidney disease with heart failure and stage 1 through stage 4 chronic kidney disease, or unspecified chronic kidney disease: Secondary | ICD-10-CM | POA: Diagnosis not present

## 2022-11-07 DIAGNOSIS — E1122 Type 2 diabetes mellitus with diabetic chronic kidney disease: Secondary | ICD-10-CM | POA: Diagnosis not present

## 2022-11-07 DIAGNOSIS — I5033 Acute on chronic diastolic (congestive) heart failure: Secondary | ICD-10-CM | POA: Diagnosis not present

## 2022-11-07 DIAGNOSIS — D631 Anemia in chronic kidney disease: Secondary | ICD-10-CM | POA: Diagnosis not present

## 2022-11-07 DIAGNOSIS — N2581 Secondary hyperparathyroidism of renal origin: Secondary | ICD-10-CM | POA: Diagnosis not present

## 2022-11-07 DIAGNOSIS — N1831 Chronic kidney disease, stage 3a: Secondary | ICD-10-CM | POA: Diagnosis not present

## 2022-11-09 ENCOUNTER — Ambulatory Visit: Payer: Medicare Other | Admitting: Cardiology

## 2022-11-13 ENCOUNTER — Other Ambulatory Visit: Payer: Self-pay | Admitting: Surgery

## 2022-11-13 DIAGNOSIS — N2581 Secondary hyperparathyroidism of renal origin: Secondary | ICD-10-CM | POA: Diagnosis not present

## 2022-11-13 DIAGNOSIS — D631 Anemia in chronic kidney disease: Secondary | ICD-10-CM | POA: Diagnosis not present

## 2022-11-13 DIAGNOSIS — N1831 Chronic kidney disease, stage 3a: Secondary | ICD-10-CM | POA: Diagnosis not present

## 2022-11-13 DIAGNOSIS — E1122 Type 2 diabetes mellitus with diabetic chronic kidney disease: Secondary | ICD-10-CM | POA: Diagnosis not present

## 2022-11-13 DIAGNOSIS — Z951 Presence of aortocoronary bypass graft: Secondary | ICD-10-CM

## 2022-11-13 DIAGNOSIS — I5033 Acute on chronic diastolic (congestive) heart failure: Secondary | ICD-10-CM | POA: Diagnosis not present

## 2022-11-13 DIAGNOSIS — I130 Hypertensive heart and chronic kidney disease with heart failure and stage 1 through stage 4 chronic kidney disease, or unspecified chronic kidney disease: Secondary | ICD-10-CM | POA: Diagnosis not present

## 2022-11-14 ENCOUNTER — Encounter: Payer: Self-pay | Admitting: Surgery

## 2022-11-14 ENCOUNTER — Encounter: Payer: Self-pay | Admitting: Podiatry

## 2022-11-14 ENCOUNTER — Ambulatory Visit (INDEPENDENT_AMBULATORY_CARE_PROVIDER_SITE_OTHER): Payer: Medicare Other | Admitting: Podiatry

## 2022-11-14 ENCOUNTER — Ambulatory Visit (INDEPENDENT_AMBULATORY_CARE_PROVIDER_SITE_OTHER): Payer: Self-pay | Admitting: Surgery

## 2022-11-14 ENCOUNTER — Ambulatory Visit
Admission: RE | Admit: 2022-11-14 | Discharge: 2022-11-14 | Disposition: A | Discharge status: Home / Self Care | Payer: Medicare Other | Source: Ambulatory Visit | Attending: Surgery | Admitting: Surgery

## 2022-11-14 VITALS — BP 156/83 | HR 109 | Resp 20 | Ht 61.0 in | Wt 175.0 lb

## 2022-11-14 VITALS — BP 181/87 | HR 114

## 2022-11-14 DIAGNOSIS — Z951 Presence of aortocoronary bypass graft: Secondary | ICD-10-CM

## 2022-11-14 DIAGNOSIS — Z9889 Other specified postprocedural states: Secondary | ICD-10-CM

## 2022-11-14 NOTE — Progress Notes (Signed)
Chief Complaint  Patient presents with   Follow-up    Patient is here for removal of stiches on left foot.    Subjective:  Patient presents today status post amputation of the fifth digit left foot. DOS: 10/06/2022 performed inpatient.  Patient states that nurses are coming to the home weekly to change the dressings.  She continues to be weightbearing in the surgical shoe.  Past Medical History:  Diagnosis Date   Anginal pain ([place])    Coronary artery disease    angioplasty 9/13   Exertional dyspnea    "usually; today it was with nothing" (11/03/2012)   GERD (gastroesophageal reflux disease)    Hypercholesterolemia with hypertriglyceridemia    Hypertension    Kidney stones    "I've had them 5 times; lithotripsy 1st time; flushed out  all the others" (11/03/2012)   Obesity (BMI 30-39.9)    Pneumonia 2012   Sinus headache    "weekly" (11/03/2012)   Type II diabetes mellitus ([place]) 2007    Past Surgical History:  Procedure Laterality Date   ABDOMINAL AORTOGRAM W/LOWER EXTREMITY Left 09/12/2021   Procedure: ABDOMINAL AORTOGRAM W/LOWER EXTREMITY;  Surgeon: Serafina Mitchell, MD;  Location: [place] [facility];  Service: Cardiovascular;  Laterality: Left;   ABDOMINAL AORTOGRAM W/LOWER EXTREMITY N/A 10/05/2022   Procedure: ABDOMINAL AORTOGRAM W/LOWER EXTREMITY;  Surgeon: Cherre Robins, MD;  Location: [place] [facility];  Service: Cardiovascular;  Laterality: N/A;   AMPUTATION TOE Left 10/06/2022   Procedure: AMPUTATION LEFT FIFTH TOE;  Surgeon: Edrick Kins, DPM;  Location: [place];  Service: Podiatry;  Laterality: Left;   CARDIAC CATHETERIZATION  11/25/29013   CORONARY ANGIOPLASTY WITH STENT PLACEMENT  08/2012   "1"   CORONARY ARTERY BYPASS GRAFT N/A 10/12/2022   Procedure: CORONARY ARTERY BYPASS GRAFTING (CABG) TIMES THREE USING LEFT INTERNAL MAMMARY ARTERY AND RIGHT LEG GREAT SAPHENOUS VEIN HARVESTED ENDOSCOPICALLY;  Surgeon: Gaye Pollack, MD;  Location: [place];  Service: Open  Heart Surgery;  Laterality: N/A;   HEMATOMA EVACUATION  09/22/2012   Procedure: EVACUATION HEMATOMA;  Surgeon: Wynonia Sours, MD;  Location: [place];  Service: Orthopedics;  Laterality: Right;  Evacuation Hematoma right hand   LEFT AND RIGHT HEART CATHETERIZATION WITH CORONARY ANGIOGRAM N/A 08/14/2012   Procedure: LEFT AND RIGHT HEART CATHETERIZATION WITH CORONARY ANGIOGRAM;  Surgeon: Minus Breeding, MD;  Location[facility] CATH LAB;  Service: Cardiovascular;  Laterality: N/A;   LEFT HEART CATH AND CORONARY ANGIOGRAPHY N/A 10/03/2022   Procedure: LEFT HEART CATH AND CORONARY ANGIOGRAPHY;  Surgeon: Early Osmond, MD;  Location: [place] CV LAB;  Service: Cardiovascular;  Laterality: N/A;   LEFT HEART CATHETERIZATION WITH CORONARY ANGIOGRAM N/A 11/03/2012   Procedure: LEFT HEART CATHETERIZATION WITH CORONARY ANGIOGRAM;  Surgeon: Jolaine Artist, MD;  Location: [facility] CATH LAB;  Service: Cardiovascular;  Laterality: N/A;   LITHOTRIPSY  1990's   PERCUTANEOUS CORONARY STENT INTERVENTION (PCI-S)  08/14/2012   Procedure: PERCUTANEOUS CORONARY STENT INTERVENTION (PCI-S);  Surgeon: Minus Breeding, MD;  Location[facility] CATH LAB;  Service: Cardiovascular;;   SKIN BIOPSY  2012   "off my back; it was nothing fatal; infection caused it" (11/03/2012)   TEE WITHOUT CARDIOVERSION N/A 10/12/2022   Procedure: TRANSESOPHAGEAL ECHOCARDIOGRAM (TEE);  Surgeon: Gaye Pollack, MD;  Location: [place];  Service: Open Heart Surgery;  Laterality: N/A;   TUBAL LIGATION  1980    Allergies  Allergen Reactions   Dapagliflozin Itching   Codeine Nausea And Vomiting  But has tolerated morphine    LT foot 11/14/2022  Objective/Physical Exam Abdominal aortogram w/ lower extremity 10/05/2022 Left lower extremity: Common femoral artery: patent without hemodynamically significant stenosis.  Profunda femoris artery: patent without hemodynamically significant stenosis.   Superficial femoral artery: patent without  hemodynamically significant stenosis.  Popliteal artery: patent without hemodynamically significant stenosis.  Anterior tibial artery: disease about the origin, but patent without hemodynamically significant stenosis.  Tibioperoneal trunk: disease about the origin, but patent without hemodynamically significant stenosis.  Peroneal artery: disease about the origin, but patent without hemodynamically significant stenosis.  Posterior tibial artery: disease about the origin, but patent without hemodynamically significant stenosis.  Pedal circulation: fills via PT / peroneal  After debridement and removal of sutures there is a granular wound base with necrotic debris around the area.  No purulence.  No malodor.  There does appear to be adequate bleeding with debridement  Assessment: 1. s/p left fifth digit amputation. DOS: 10/06/2022   Plan of Care:  1. Patient was evaluated.  2.  Sutures removed as well as debridement of the amputation site 3.  Recommend Betadine wet-to-dry dressings.  Patient has home health nurse coming to the home 4.  WBAT postsurgical shoe 5.  RTC 3 weeks for follow-up and x-ray  Edrick Kins, DPM [facility]  Dr. Edrick Kins, DPM    [address], [place] [address]                Office [phone]  Fax [phone]

## 2022-11-14 NOTE — Progress Notes (Signed)
HPI: Patient returns for routine postoperative follow-up having undergone coronary bypass graft surgery x 3 on 10/12/2022.  She had amputation of a gangrenous left fifth toe preoperatively by Dr. Amalia Hailey. The patient's early postoperative recovery while in the hospital was notable for an uncomplicated postoperative course. Since hospital discharge the patient reports that she has been feeling well.  She is walking daily without chest pain or shortness of breath.  She is using a cane for stability.  She has an appointment later today to see Dr. Amalia Hailey for follow-up of her left foot.   Current Outpatient Medications  Medication Sig Dispense Refill   acetaminophen (TYLENOL) 500 MG tablet Take 500 mg by mouth every 4 (four) hours as needed for moderate pain or mild pain.     aspirin EC 325 MG tablet Take 1 tablet (325 mg total) by mouth daily. 100 tablet 4   Cholecalciferol (VITAMIN D3) 1.25 MG (50000 UT) TABS Take 5,000 Units by mouth.     empagliflozin (JARDIANCE) 10 MG TABS tablet Take 1 tablet (10 mg total) by mouth daily. 30 tablet 2   ezetimibe (ZETIA) 10 MG tablet Take 1 tablet (10 mg total) by mouth daily. 30 tablet 5   glimepiride (AMARYL) 2 MG tablet Take 2 mg by mouth 3 (three) times daily.     LANTUS SOLOSTAR 100 UNIT/ML Solostar Pen Inject into the skin.     losartan-hydrochlorothiazide (HYZAAR) 100-25 MG tablet Take 1 tablet by mouth daily. 90 tablet 3   metoprolol (LOPRESSOR) 50 MG tablet Take 50 mg by mouth 2 (two) times daily.     omeprazole (PRILOSEC) 20 MG capsule Take 20 mg by mouth daily.     Pyridoxine HCl (VITAMIN B6 PO) Take 1 capsule by mouth daily.     rosuvastatin (CRESTOR) 20 MG tablet Take 1 tablet (20 mg total) by mouth daily. 90 tablet 3   DULoxetine (CYMBALTA) 30 MG capsule Take 1 capsule (30 mg total) by mouth daily. 30 capsule 0   No current facility-administered medications for this visit.    Physical Exam: BP (!) 156/83 (BP Location: Left Arm, Patient  Position: Sitting)   Pulse (!) 109   Resp 20   Ht '5\' 1"'$  (1.549 m)   Wt 175 lb (79.4 kg)   SpO2 96% Comment: RA  BMI 33.07 kg/m  She looks well. Cardiac exam shows a regular rate and rhythm with normal heart sounds. Lungs are clear. The chest incision is healing well and the sternum is stable.  Her leg incisions are healing well and there is no peripheral edema.  Diagnostic Tests:  Narrative & Impression  CLINICAL DATA:  CABG.   EXAM: CHEST - 2 VIEW   COMPARISON:  10/17/2022   FINDINGS: Low volume film. The lungs are clear without focal pneumonia, edema, pneumothorax or pleural effusion. Cardiopericardial silhouette is at upper limits of normal for size. The visualized bony structures of the thorax are unremarkable.   IMPRESSION: No active cardiopulmonary disease.     Electronically Signed   By: Misty Stanley M.D.   On: 11/14/2022 10:22      Impression:  She is doing well 1 month following her surgery.  I encouraged her to continue walking is much as possible.  I asked her not to lift anything heavier than 10 pounds for 3 months postoperatively.  I discussed the importance of maximum cardiac risk factor reduction including tight control of her diabetes.  She said that she understands the importance of this.  Plan:  She has a follow-up appointment with Dr. Percival Spanish in March 2024.  She will return to see me if she has any problems with her incisions.  She is following up with Dr. Amalia Hailey of podiatry concerning her left foot later today.   Gaye Pollack, MD [facility] [phone]

## 2022-11-15 DIAGNOSIS — D631 Anemia in chronic kidney disease: Secondary | ICD-10-CM | POA: Diagnosis not present

## 2022-11-15 DIAGNOSIS — E1122 Type 2 diabetes mellitus with diabetic chronic kidney disease: Secondary | ICD-10-CM | POA: Diagnosis not present

## 2022-11-15 DIAGNOSIS — N2581 Secondary hyperparathyroidism of renal origin: Secondary | ICD-10-CM | POA: Diagnosis not present

## 2022-11-15 DIAGNOSIS — N1831 Chronic kidney disease, stage 3a: Secondary | ICD-10-CM | POA: Diagnosis not present

## 2022-11-15 DIAGNOSIS — I5033 Acute on chronic diastolic (congestive) heart failure: Secondary | ICD-10-CM | POA: Diagnosis not present

## 2022-11-15 DIAGNOSIS — I130 Hypertensive heart and chronic kidney disease with heart failure and stage 1 through stage 4 chronic kidney disease, or unspecified chronic kidney disease: Secondary | ICD-10-CM | POA: Diagnosis not present

## 2022-11-20 DIAGNOSIS — N2581 Secondary hyperparathyroidism of renal origin: Secondary | ICD-10-CM | POA: Diagnosis not present

## 2022-11-20 DIAGNOSIS — I130 Hypertensive heart and chronic kidney disease with heart failure and stage 1 through stage 4 chronic kidney disease, or unspecified chronic kidney disease: Secondary | ICD-10-CM | POA: Diagnosis not present

## 2022-11-20 DIAGNOSIS — N1831 Chronic kidney disease, stage 3a: Secondary | ICD-10-CM | POA: Diagnosis not present

## 2022-11-20 DIAGNOSIS — D631 Anemia in chronic kidney disease: Secondary | ICD-10-CM | POA: Diagnosis not present

## 2022-11-20 DIAGNOSIS — T8131XA Disruption of external operation (surgical) wound, not elsewhere classified, initial encounter: Secondary | ICD-10-CM | POA: Diagnosis not present

## 2022-11-20 DIAGNOSIS — E1122 Type 2 diabetes mellitus with diabetic chronic kidney disease: Secondary | ICD-10-CM | POA: Diagnosis not present

## 2022-11-20 DIAGNOSIS — I5033 Acute on chronic diastolic (congestive) heart failure: Secondary | ICD-10-CM | POA: Diagnosis not present

## 2022-11-20 DIAGNOSIS — L97529 Non-pressure chronic ulcer of other part of left foot with unspecified severity: Secondary | ICD-10-CM | POA: Diagnosis not present

## 2022-11-22 DIAGNOSIS — I5033 Acute on chronic diastolic (congestive) heart failure: Secondary | ICD-10-CM | POA: Diagnosis not present

## 2022-11-22 DIAGNOSIS — I130 Hypertensive heart and chronic kidney disease with heart failure and stage 1 through stage 4 chronic kidney disease, or unspecified chronic kidney disease: Secondary | ICD-10-CM | POA: Diagnosis not present

## 2022-11-22 DIAGNOSIS — D631 Anemia in chronic kidney disease: Secondary | ICD-10-CM | POA: Diagnosis not present

## 2022-11-22 DIAGNOSIS — N1831 Chronic kidney disease, stage 3a: Secondary | ICD-10-CM | POA: Diagnosis not present

## 2022-11-22 DIAGNOSIS — E1122 Type 2 diabetes mellitus with diabetic chronic kidney disease: Secondary | ICD-10-CM | POA: Diagnosis not present

## 2022-11-22 DIAGNOSIS — N2581 Secondary hyperparathyroidism of renal origin: Secondary | ICD-10-CM | POA: Diagnosis not present

## 2022-11-27 DIAGNOSIS — N2581 Secondary hyperparathyroidism of renal origin: Secondary | ICD-10-CM | POA: Diagnosis not present

## 2022-11-27 DIAGNOSIS — N1831 Chronic kidney disease, stage 3a: Secondary | ICD-10-CM | POA: Diagnosis not present

## 2022-11-27 DIAGNOSIS — D631 Anemia in chronic kidney disease: Secondary | ICD-10-CM | POA: Diagnosis not present

## 2022-11-27 DIAGNOSIS — I130 Hypertensive heart and chronic kidney disease with heart failure and stage 1 through stage 4 chronic kidney disease, or unspecified chronic kidney disease: Secondary | ICD-10-CM | POA: Diagnosis not present

## 2022-11-27 DIAGNOSIS — E1122 Type 2 diabetes mellitus with diabetic chronic kidney disease: Secondary | ICD-10-CM | POA: Diagnosis not present

## 2022-11-27 DIAGNOSIS — I5033 Acute on chronic diastolic (congestive) heart failure: Secondary | ICD-10-CM | POA: Diagnosis not present

## 2022-12-05 DIAGNOSIS — I130 Hypertensive heart and chronic kidney disease with heart failure and stage 1 through stage 4 chronic kidney disease, or unspecified chronic kidney disease: Secondary | ICD-10-CM | POA: Diagnosis not present

## 2022-12-05 DIAGNOSIS — N1831 Chronic kidney disease, stage 3a: Secondary | ICD-10-CM | POA: Diagnosis not present

## 2022-12-05 DIAGNOSIS — I5033 Acute on chronic diastolic (congestive) heart failure: Secondary | ICD-10-CM | POA: Diagnosis not present

## 2022-12-05 DIAGNOSIS — D631 Anemia in chronic kidney disease: Secondary | ICD-10-CM | POA: Diagnosis not present

## 2022-12-05 DIAGNOSIS — E1122 Type 2 diabetes mellitus with diabetic chronic kidney disease: Secondary | ICD-10-CM | POA: Diagnosis not present

## 2022-12-05 DIAGNOSIS — N2581 Secondary hyperparathyroidism of renal origin: Secondary | ICD-10-CM | POA: Diagnosis not present

## 2022-12-12 ENCOUNTER — Ambulatory Visit (INDEPENDENT_AMBULATORY_CARE_PROVIDER_SITE_OTHER): Payer: Medicare HMO | Admitting: Podiatry

## 2022-12-12 DIAGNOSIS — Z9889 Other specified postprocedural states: Secondary | ICD-10-CM

## 2022-12-12 DIAGNOSIS — L97522 Non-pressure chronic ulcer of other part of left foot with fat layer exposed: Secondary | ICD-10-CM

## 2022-12-12 NOTE — Progress Notes (Signed)
Chief Complaint  Patient presents with   Foot Problem    - L 5TH TOE F/U    Subjective:  Patient presents today status post amputation of the fifth digit left foot. DOS: 10/06/2022 performed inpatient.  Patient doing well.  She has been applying the Betadine daily to the foot wound.  She has also been WBAT postsurgical shoe  Past Medical History:  Diagnosis Date   Anginal pain ([place])    Coronary artery disease    angioplasty 9/13   Exertional dyspnea    "usually; today it was with nothing" (11/03/2012)   GERD (gastroesophageal reflux disease)    Hypercholesterolemia with hypertriglyceridemia    Hypertension    Kidney stones    "I've had them 5 times; lithotripsy 1st time; flushed out  all the others" (11/03/2012)   Obesity (BMI 30-39.9)    Pneumonia 2012   Sinus headache    "weekly" (11/03/2012)   Type II diabetes mellitus ([place]) 2007    Past Surgical History:  Procedure Laterality Date   ABDOMINAL AORTOGRAM W/LOWER EXTREMITY Left 09/12/2021   Procedure: ABDOMINAL AORTOGRAM W/LOWER EXTREMITY;  Surgeon: Serafina Mitchell, MD;  Location: [place] [facility];  Service: Cardiovascular;  Laterality: Left;   ABDOMINAL AORTOGRAM W/LOWER EXTREMITY N/A 10/05/2022   Procedure: ABDOMINAL AORTOGRAM W/LOWER EXTREMITY;  Surgeon: Cherre Robins, MD;  Location: [place] CV LAB;  Service: Cardiovascular;  Laterality: N/A;   AMPUTATION TOE Left 10/06/2022   Procedure: AMPUTATION LEFT FIFTH TOE;  Surgeon: Edrick Kins, DPM;  Location: [place];  Service: Podiatry;  Laterality: Left;   CARDIAC CATHETERIZATION  11/25/29013   CORONARY ANGIOPLASTY WITH STENT PLACEMENT  08/2012   "1"   CORONARY ARTERY BYPASS GRAFT N/A 10/12/2022   Procedure: CORONARY ARTERY BYPASS GRAFTING (CABG) TIMES THREE USING LEFT INTERNAL MAMMARY ARTERY AND RIGHT LEG GREAT SAPHENOUS VEIN HARVESTED ENDOSCOPICALLY;  Surgeon: Gaye Pollack, MD;  Location: [place];  Service: Open Heart Surgery;  Laterality: N/A;   HEMATOMA  EVACUATION  09/22/2012   Procedure: EVACUATION HEMATOMA;  Surgeon: Wynonia Sours, MD;  Location: [place];  Service: Orthopedics;  Laterality: Right;  Evacuation Hematoma right hand   LEFT AND RIGHT HEART CATHETERIZATION WITH CORONARY ANGIOGRAM N/A 08/14/2012   Procedure: LEFT AND RIGHT HEART CATHETERIZATION WITH CORONARY ANGIOGRAM;  Surgeon: Minus Breeding, MD;  Location: [facility] CATH LAB;  Service: Cardiovascular;  Laterality: N/A;   LEFT HEART CATH AND CORONARY ANGIOGRAPHY N/A 10/03/2022   Procedure: LEFT HEART CATH AND CORONARY ANGIOGRAPHY;  Surgeon: Early Osmond, MD;  Location: [place] [facility] LAB;  Service: Cardiovascular;  Laterality: N/A;   LEFT HEART CATHETERIZATION WITH CORONARY ANGIOGRAM N/A 11/03/2012   Procedure: LEFT HEART CATHETERIZATION WITH CORONARY ANGIOGRAM;  Surgeon: Jolaine Artist, MD;  Location: [facility] CATH LAB;  Service: Cardiovascular;  Laterality: N/A;   LITHOTRIPSY  1990's   PERCUTANEOUS CORONARY STENT INTERVENTION (PCI-S)  08/14/2012   Procedure: PERCUTANEOUS CORONARY STENT INTERVENTION (PCI-S);  Surgeon: Minus Breeding, MD;  Location: [facility] C[facility] LAB;  Service: Cardiovascular;;   SKIN BIOPSY  2012   "off my back; it was nothing fatal; infection caused it" (11/03/2012)   TEE WITHOUT CARDIOVERSION N/A 10/12/2022   Procedure: TRANSESOPHAGEAL ECHOCARDIOGRAM (TEE);  Surgeon: Gaye Pollack, MD;  Location: [place];  Service: Open Heart Surgery;  Laterality: N/A;   TUBAL LIGATION  1980    Allergies  Allergen Reactions   Dapagliflozin Itching   Codeine Nausea And Vomiting    But has tolerated  morphine    LT foot 11/14/2022   12/12/2022  Objective/Physical Exam Abdominal aortogram w/ lower extremity 10/05/2022 Left lower extremity: Common femoral artery: patent without hemodynamically significant stenosis.  Profunda femoris artery: patent without hemodynamically significant stenosis.   Superficial femoral artery: patent without hemodynamically significant  stenosis.  Popliteal artery: patent without hemodynamically significant stenosis.  Anterior tibial artery: disease about the origin, but patent without hemodynamically significant stenosis.  Tibioperoneal trunk: disease about the origin, but patent without hemodynamically significant stenosis.  Peroneal artery: disease about the origin, but patent without hemodynamically significant stenosis.  Posterior tibial artery: disease about the origin, but patent without hemodynamically significant stenosis.  Pedal circulation: fills via PT / peroneal  There continues to be a granular wound base with necrotic debris around the area and well adhered eschar to the majority of the wound base.  No purulence.  No malodor.  There does appear to be adequate bleeding with debridement.  There is exposed tendon to the central portion of the wound.  Assessment: 1. s/p left fifth digit amputation. DOS: 10/06/2022 -Patient evaluated -Continue Betadine wet-to-dry dressings.  Patient has home health nurse coming to the home which ends this week -Continue WBAT postsurgical shoe -Referral placed for [facility] wound care center.  I do believe they will be able to manage the wound appropriately and their specialty is always greatly appreciated -Return to clinic 8 weeks  Edrick Kins, DPM [facility]  Dr. Edrick Kins, DPM    [address], [place] [address]                Office [phone]  Fax [phone]

## 2022-12-18 ENCOUNTER — Telehealth: Payer: Self-pay | Admitting: *Deleted

## 2022-12-18 NOTE — Telephone Encounter (Signed)
Amanda Mendez w/ [facility] wound center is requesting the referral, unable to see in epic, faxed to them '@[phone]'$ [phone].

## 2022-12-20 DIAGNOSIS — M542 Cervicalgia: Secondary | ICD-10-CM | POA: Diagnosis not present

## 2022-12-20 DIAGNOSIS — S0990XA Unspecified injury of head, initial encounter: Secondary | ICD-10-CM | POA: Diagnosis not present

## 2022-12-20 DIAGNOSIS — M79603 Pain in arm, unspecified: Secondary | ICD-10-CM | POA: Diagnosis not present

## 2022-12-20 DIAGNOSIS — W1830XA Fall on same level, unspecified, initial encounter: Secondary | ICD-10-CM | POA: Diagnosis not present

## 2022-12-20 DIAGNOSIS — M25519 Pain in unspecified shoulder: Secondary | ICD-10-CM | POA: Diagnosis not present

## 2022-12-20 DIAGNOSIS — R58 Hemorrhage, not elsewhere classified: Secondary | ICD-10-CM | POA: Diagnosis not present

## 2022-12-20 DIAGNOSIS — S134XXA Sprain of ligaments of cervical spine, initial encounter: Secondary | ICD-10-CM | POA: Diagnosis not present

## 2022-12-20 DIAGNOSIS — S42221A 2-part displaced fracture of surgical neck of right humerus, initial encounter for closed fracture: Secondary | ICD-10-CM | POA: Diagnosis not present

## 2022-12-20 DIAGNOSIS — I10 Essential (primary) hypertension: Secondary | ICD-10-CM | POA: Diagnosis not present

## 2022-12-20 DIAGNOSIS — S4991XA Unspecified injury of right shoulder and upper arm, initial encounter: Secondary | ICD-10-CM | POA: Diagnosis not present

## 2022-12-21 DIAGNOSIS — M25511 Pain in right shoulder: Secondary | ICD-10-CM | POA: Diagnosis not present

## 2022-12-28 DIAGNOSIS — M25511 Pain in right shoulder: Secondary | ICD-10-CM | POA: Diagnosis not present

## 2023-01-15 ENCOUNTER — Ambulatory Visit (HOSPITAL_BASED_OUTPATIENT_CLINIC_OR_DEPARTMENT_OTHER): Payer: Medicare Other | Admitting: General Surgery

## 2023-02-19 NOTE — Progress Notes (Unsigned)
Cardiology Office Note   Date:  02/20/2023   ID:  Amanda Mendez, DOB 1956-07-30, MRN [id number]  PCP:  Redmond School, MD  Cardiologist:   Minus Breeding, MD Referring:  Redmond School, MD  Chief Complaint  Patient presents with   Coronary Artery Disease     History of Present Illness: Amanda Mendez is a 67 y.o. female who presents for evaluation of coronary artery disease and history of difficult to control hypertension.   She has had coronary artery disease in the distant past.  She has had a history of chronic diastolic dysfunction.  Last echocardiogram in 2015 suggested moderate left ventricular hypertrophy but well-preserved ejection fraction.  There is a somewhat suboptimal study.    She had SOB when I saw her late I 2023.  She was found to have disease as below and is now status post CABG.    This is her first visit with me post CABG.  Unfortunately she fell and broke her arm.  She says she is done very well from a cardiac standpoint.  The chest discomfort that she was having is resolved.  She is not having any new shortness of breath, PND or orthopnea.  She is not having any new palpitations, presyncope or syncope.  She has had no weight gain or edema.   Past Medical History:  Diagnosis Date   Anginal pain ([place])    Coronary artery disease    angioplasty 9/13   Exertional dyspnea    "usually; today it was with nothing" (11/03/2012)   GERD (gastroesophageal reflux disease)    Hypercholesterolemia with hypertriglyceridemia    Hypertension    Kidney stones    "I've had them 5 times; lithotripsy 1st time; flushed out  all the others" (11/03/2012)   Obesity (BMI 30-39.9)    Pneumonia 2012   Sinus headache    "weekly" (11/03/2012)   Type II diabetes mellitus ([place]) 2007    Past Surgical History:  Procedure Laterality Date   ABDOMINAL AORTOGRAM W/LOWER EXTREMITY Left 09/12/2021   Procedure: ABDOMINAL AORTOGRAM W/LOWER EXTREMITY;  Surgeon: Serafina Mitchell, MD;   Location: [place] CV LAB;  Service: Cardiovascular;  Laterality: Left;   ABDOMINAL AORTOGRAM W/LOWER EXTREMITY N/A 10/05/2022   Procedure: ABDOMINAL AORTOGRAM W/LOWER EXTREMITY;  Surgeon: Cherre Robins, MD;  Location: [place] CV LAB;  Service: Cardiovascular;  Laterality: N/A;   AMPUTATION TOE Left 10/06/2022   Procedure: AMPUTATION LEFT FIFTH TOE;  Surgeon: Edrick Kins, DPM;  Location: [place];  Service: Podiatry;  Laterality: Left;   CARDIAC CATHETERIZATION  11/25/29013   CORONARY ANGIOPLASTY WITH STENT PLACEMENT  08/2012   "1"   CORONARY ARTERY BYPASS GRAFT N/A 10/12/2022   Procedure: CORONARY ARTERY BYPASS GRAFTING (CABG) TIMES THREE USING LEFT INTERNAL MAMMARY ARTERY AND RIGHT LEG GREAT SAPHENOUS VEIN HARVESTED ENDOSCOPICALLY;  Surgeon: Gaye Pollack, MD;  Location: [place];  Service: Open Heart Surgery;  Laterality: N/A;   HEMATOMA EVACUATION  09/22/2012   Procedure: EVACUATION HEMATOMA;  Surgeon: Wynonia Sours, MD;  Location: [place];  Service: Orthopedics;  Laterality: Right;  Evacuation Hematoma right hand   LEFT AND RIGHT HEART CATHETERIZATION WITH CORONARY ANGIOGRAM N/A 08/14/2012   Procedure: LEFT AND RIGHT HEART CATHETERIZATION WITH CORONARY ANGIOGRAM;  Surgeon: Minus Breeding, MD;  Location: [facility] CATH LAB;  Service: Cardiovascular;  Laterality: N/A;   LEFT HEART CATH AND CORONARY ANGIOGRAPHY N/A 10/03/2022   Procedure: LEFT HEART CATH AND CORONARY ANGIOGRAPHY;  Surgeon: Early Osmond,  MD;  Location: [place] CV LAB;  Service: Cardiovascular;  Laterality: N/A;   LEFT HEART CATHETERIZATION WITH CORONARY ANGIOGRAM N/A 11/03/2012   Procedure: LEFT HEART CATHETERIZATION WITH CORONARY ANGIOGRAM;  Surgeon: Jolaine Artist, MD;  Location: [facility] CATH LAB;  Service: Cardiovascular;  Laterality: N/A;   LITHOTRIPSY  1990's   PERCUTANEOUS CORONARY STENT INTERVENTION (PCI-S)  08/14/2012   Procedure: PERCUTANEOUS CORONARY STENT INTERVENTION (PCI-S);  Surgeon: Minus Breeding, MD;  Location: [facility] CATH LAB;  Service: Cardiovascular;;   SKIN BIOPSY  2012   "off my back; it was nothing fatal; infection caused it" (11/03/2012)   TEE WITHOUT CARDIOVERSION N/A 10/12/2022   Procedure: TRANSESOPHAGEAL ECHOCARDIOGRAM (TEE);  Surgeon: Gaye Pollack, MD;  Location: [place];  Service: Open Heart Surgery;  Laterality: N/A;   TUBAL LIGATION  1980     Current Outpatient Medications  Medication Sig Dispense Refill   acetaminophen (TYLENOL) 500 MG tablet Take 500 mg by mouth every 4 (four) hours as needed for moderate pain or mild pain.     aspirin EC 325 MG tablet Take 1 tablet (325 mg total) by mouth daily. 100 tablet 4   Cholecalciferol (VITAMIN D3) 1.25 MG (50000 UT) TABS Take 5,000 Units by mouth.     DULoxetine (CYMBALTA) 30 MG capsule Take 1 capsule (30 mg total) by mouth daily. 30 capsule 0   empagliflozin (JARDIANCE) 10 MG TABS tablet Take 1 tablet (10 mg total) by mouth daily. 30 tablet 2   ezetimibe (ZETIA) 10 MG tablet Take 1 tablet (10 mg total) by mouth daily. 30 tablet 5   glimepiride (AMARYL) 2 MG tablet Take 2 mg by mouth 3 (three) times daily.     LANTUS SOLOSTAR 100 UNIT/ML Solostar Pen Inject into the skin.     losartan-hydrochlorothiazide (HYZAAR) 100-25 MG tablet Take 1 tablet by mouth daily. 90 tablet 3   metoprolol (LOPRESSOR) 50 MG tablet Take 50 mg by mouth 2 (two) times daily.     omeprazole (PRILOSEC) 20 MG capsule Take 20 mg by mouth daily.     Pyridoxine HCl (VITAMIN B6 PO) Take 1 capsule by mouth daily.     rosuvastatin (CRESTOR) 20 MG tablet Take 1 tablet (20 mg total) by mouth daily. 90 tablet 3   No current facility-administered medications for this visit.    Allergies:   Dapagliflozin and Codeine    ROS:  Please see the history of present illness.   Otherwise, review of systems are positive for none.   All other systems are reviewed and negative.    PHYSICAL EXAM: VS:  BP 132/86   Pulse (!) 104   Ht '5\' 2"'$  (1.575 m)   Wt 162 lb  (73.5 kg)   BMI 29.63 kg/m  , BMI Body mass index is 29.63 kg/m. GENERAL:  Well appearing NECK:  No jugular venous distention, waveform within normal limits, carotid upstroke brisk and symmetric, no bruits, no thyromegaly LUNGS:  Clear to auscultation bilaterally CHEST:  Well healed sternotomy scar. HEART:  PMI not displaced or sustained,S1 and S2 within normal limits, no S3, no S4, no clicks, no rubs, no murmurs ABD:  Flat, positive bowel sounds normal in frequency in pitch, no bruits, no rebound, no guarding, no midline pulsatile mass, no hepatomegaly, no splenomegaly EXT:  2 plus pulses throughout, no edema, no cyanosis no clubbing  T:  2 plus pulses throughout, no edema, no cyanosis no clubbing  EKG:  EKG is not ordered today.    Recent Labs:  10/12/2022: ALT 24 10/13/2022: Magnesium 2.4 10/15/2022: Hemoglobin 10.2; Platelets 241 10/18/2022: BUN 34; Creatinine, Ser 1.68; Potassium 3.8; Sodium 136    Lipid Panel    Component Value Date/Time   CHOL 160 10/06/2022 0635   TRIG 260 (H) 10/06/2022 0635   HDL 46 10/06/2022 0635   CHOLHDL 3.5 10/06/2022 0635   VLDL 52 (H) 10/06/2022 0635   LDLCALC 62 10/06/2022 0635      Wt Readings from Last 3 Encounters:  02/20/23 162 lb (73.5 kg)  11/14/22 175 lb (79.4 kg)  10/18/22 181 lb 14.1 oz (82.5 kg)     Diagnostic cath 10/25  [place]: Right     Other studies Reviewed: Additional studies/ records that were reviewed today include: Hospital records Review of the above records demonstrates:  Please see elsewhere in the note.     ASSESSMENT AND PLAN:  CAD - She is status post CABG. she will continue with risk reduct.  Her computer you by the way [employer] specified because I also I am in [place] almost all the time ion.  No further testing.   Diabetes mellitus  - A1c was 8.5 I believe most recently but I do not have these records.  We talked about the importance of good blood sugar control and she will follow-up with Redmond School, MD   Hypertension - The blood pressure is at target.  No change in therapy.    Hypercholesterolemia with hypertriglyceridemia - LDL was 62 with an HDL of 46.  No change in therapy.  Diastolic heart failure - She is euvolemic.  No change in therapy.   Current medicines are reviewed at length with the patient today.  The patient does not have concerns regarding medicines.  The following changes have been made: None  Labs/ tests ordered today include: None  No orders of the defined types were placed in this encounter.  Disposition:   FU with 6 months   Signed, Minus Breeding, MD  02/20/2023 4:14 PM    [facility]

## 2023-02-20 ENCOUNTER — Encounter: Payer: Self-pay | Admitting: Cardiology

## 2023-02-20 ENCOUNTER — Ambulatory Visit (INDEPENDENT_AMBULATORY_CARE_PROVIDER_SITE_OTHER): Payer: Medicare HMO | Admitting: Cardiology

## 2023-02-20 VITALS — BP 132/86 | HR 104 | Ht 62.0 in | Wt 162.0 lb

## 2023-02-20 DIAGNOSIS — I5032 Chronic diastolic (congestive) heart failure: Secondary | ICD-10-CM | POA: Diagnosis not present

## 2023-02-20 DIAGNOSIS — E785 Hyperlipidemia, unspecified: Secondary | ICD-10-CM

## 2023-02-20 DIAGNOSIS — I10 Essential (primary) hypertension: Secondary | ICD-10-CM | POA: Diagnosis not present

## 2023-02-20 DIAGNOSIS — I2510 Atherosclerotic heart disease of native coronary artery without angina pectoris: Secondary | ICD-10-CM | POA: Diagnosis not present

## 2023-02-20 DIAGNOSIS — Z89422 Acquired absence of other left toe(s): Secondary | ICD-10-CM | POA: Diagnosis not present

## 2023-02-20 NOTE — Patient Instructions (Signed)
Medication Instructions:  The current medical regimen is effective;  continue present plan and medications.  *If you need a refill on your cardiac medications before your next appointment, please call your pharmacy*  Follow-Up: At [facility], you and your health needs are our priority.  As part of our continuing mission to provide you with exceptional heart care, we have created designated Provider Care Teams.  These Care Teams include your primary Cardiologist (physician) and Advanced Practice Providers (APPs -  Physician Assistants and Nurse Practitioners) who all work together to provide you with the care you need, when you need it.  We recommend signing up for the patient portal called "MyChart".  Sign up information is provided on this After Visit Summary.  MyChart is used to connect with patients for Virtual Visits (Telemedicine).  Patients are able to view lab/test results, encounter notes, upcoming appointments, etc.  Non-urgent messages can be sent to your provider as well.   To learn more about what you can do with MyChart, go to [email or web address].    Your next appointment:   6 month(s)  Provider:   James Hochrein, MD    

## 2023-03-27 ENCOUNTER — Other Ambulatory Visit: Payer: Self-pay | Admitting: Physician Assistant

## 2023-04-02 ENCOUNTER — Other Ambulatory Visit: Payer: Self-pay | Admitting: Physician Assistant

## 2023-04-05 ENCOUNTER — Other Ambulatory Visit: Payer: Self-pay | Admitting: Physician Assistant

## 2023-04-23 ENCOUNTER — Other Ambulatory Visit: Payer: Self-pay | Admitting: Physician Assistant

## 2023-05-07 DIAGNOSIS — E6609 Other obesity due to excess calories: Secondary | ICD-10-CM | POA: Diagnosis not present

## 2023-05-07 DIAGNOSIS — E1165 Type 2 diabetes mellitus with hyperglycemia: Secondary | ICD-10-CM | POA: Diagnosis not present

## 2023-05-07 DIAGNOSIS — Z6830 Body mass index (BMI) 30.0-30.9, adult: Secondary | ICD-10-CM | POA: Diagnosis not present

## 2023-05-07 DIAGNOSIS — I130 Hypertensive heart and chronic kidney disease with heart failure and stage 1 through stage 4 chronic kidney disease, or unspecified chronic kidney disease: Secondary | ICD-10-CM | POA: Diagnosis not present

## 2023-05-07 DIAGNOSIS — E1122 Type 2 diabetes mellitus with diabetic chronic kidney disease: Secondary | ICD-10-CM | POA: Diagnosis not present

## 2023-05-07 DIAGNOSIS — I700 Atherosclerosis of aorta: Secondary | ICD-10-CM | POA: Diagnosis not present

## 2023-05-07 DIAGNOSIS — I10 Essential (primary) hypertension: Secondary | ICD-10-CM | POA: Diagnosis not present

## 2023-05-07 DIAGNOSIS — N390 Urinary tract infection, site not specified: Secondary | ICD-10-CM | POA: Diagnosis not present

## 2023-05-07 DIAGNOSIS — N1831 Chronic kidney disease, stage 3a: Secondary | ICD-10-CM | POA: Diagnosis not present

## 2023-08-18 NOTE — Progress Notes (Deleted)
Cardiology Office Note:   Date:  08/18/2023  ID:  Amanda Mendez, DOB [birth year], MRN [id number] PCP: Elfredia Nevins, MD  [facility] Providers Cardiologist:  Rollene Rotunda, MD {  History of Present Illness:   Amanda Mendez is a 67 y.o. female who presents for evaluation of coronary artery disease and history of difficult to control hypertension.   She has had coronary artery disease in the distant past.  She has had a history of chronic diastolic dysfunction.  Last echocardiogram in 2015 suggested moderate left ventricular hypertrophy but well-preserved ejection fraction.  There is a somewhat suboptimal study.    She had SOB when I saw her late I 2023.  She was found to have disease as below and is now status post CABG.    ***   ***  This is her first visit with me post CABG.  Unfortunately she fell and broke her arm.  She says she is done very well from a cardiac standpoint.  The chest discomfort that she was having is resolved.  She is not having any new shortness of breath, PND or orthopnea.  She is not having any new palpitations, presyncope or syncope.  She has had no weight gain or edema.      ROS: ***  Studies Reviewed:    EKG:       ***  Risk Assessment/Calculations:   {Does this patient have ATRIAL FIBRILLATION?:[phone]} No BP recorded.  {Refresh Note OR Click here to enter BP  :1}***        Physical Exam:   VS:  There were no vitals taken for this visit.   Wt Readings from Last 3 Encounters:  02/20/23 162 lb (73.5 kg)  11/14/22 175 lb (79.4 kg)  10/18/22 181 lb 14.1 oz (82.5 kg)     GEN: Well nourished, well developed in no acute distress NECK: No JVD; No carotid bruits CARDIAC: ***RRR, no murmurs, rubs, gallops RESPIRATORY:  Clear to auscultation without rales, wheezing or rhonchi  ABDOMEN: Soft, non-tender, non-distended EXTREMITIES:  No edema; No deformity   ASSESSMENT AND PLAN:   CAD - *** She is status post CABG. she will continue with risk  reduct.  Her computer you by the way [employer] specified because I also I am in [place] almost all the time ion.  No further testing.    Diabetes mellitus  - A1c was ***  8.5 I believe most recently but I do not have these records.  We talked about the importance of good blood sugar control and she will follow-up with Elfredia Nevins, MD    Hypertension - The blood pressure is *** at target.  No change in therapy.     Hypercholesterolemia with hypertriglyceridemia - LDL was ***  62 with an HDL of 46.  No change in therapy.   Diastolic heart failure - ***  euvolemic.  No change in therapy.       {Are you ordering a CV Procedure (e.g. stress test, cath, DCCV, TEE, etc)?   Press F2        :[id number]}  Follow up ***  Signed, Rollene Rotunda, MD

## 2023-08-21 ENCOUNTER — Ambulatory Visit: Payer: Medicare HMO | Admitting: Cardiology

## 2023-08-21 DIAGNOSIS — E785 Hyperlipidemia, unspecified: Secondary | ICD-10-CM

## 2023-08-21 DIAGNOSIS — I10 Essential (primary) hypertension: Secondary | ICD-10-CM

## 2023-08-21 DIAGNOSIS — E118 Type 2 diabetes mellitus with unspecified complications: Secondary | ICD-10-CM

## 2023-08-21 DIAGNOSIS — I5032 Chronic diastolic (congestive) heart failure: Secondary | ICD-10-CM

## 2023-08-21 DIAGNOSIS — I2510 Atherosclerotic heart disease of native coronary artery without angina pectoris: Secondary | ICD-10-CM

## 2023-09-27 ENCOUNTER — Other Ambulatory Visit: Payer: Self-pay | Admitting: Cardiology

## 2023-10-24 ENCOUNTER — Other Ambulatory Visit: Payer: Self-pay | Admitting: Internal Medicine

## 2023-10-24 DIAGNOSIS — Z1231 Encounter for screening mammogram for malignant neoplasm of breast: Secondary | ICD-10-CM

## 2023-11-26 DIAGNOSIS — I5032 Chronic diastolic (congestive) heart failure: Secondary | ICD-10-CM | POA: Diagnosis not present

## 2023-11-26 DIAGNOSIS — I5033 Acute on chronic diastolic (congestive) heart failure: Secondary | ICD-10-CM | POA: Diagnosis not present

## 2023-11-26 DIAGNOSIS — N1831 Chronic kidney disease, stage 3a: Secondary | ICD-10-CM | POA: Diagnosis not present

## 2023-11-26 DIAGNOSIS — E538 Deficiency of other specified B group vitamins: Secondary | ICD-10-CM | POA: Diagnosis not present

## 2023-11-26 DIAGNOSIS — Z9229 Personal history of other drug therapy: Secondary | ICD-10-CM | POA: Diagnosis not present

## 2023-11-26 DIAGNOSIS — I700 Atherosclerosis of aorta: Secondary | ICD-10-CM | POA: Diagnosis not present

## 2023-11-26 DIAGNOSIS — R251 Tremor, unspecified: Secondary | ICD-10-CM | POA: Diagnosis not present

## 2023-11-26 DIAGNOSIS — I130 Hypertensive heart and chronic kidney disease with heart failure and stage 1 through stage 4 chronic kidney disease, or unspecified chronic kidney disease: Secondary | ICD-10-CM | POA: Diagnosis not present

## 2023-11-26 DIAGNOSIS — E1122 Type 2 diabetes mellitus with diabetic chronic kidney disease: Secondary | ICD-10-CM | POA: Diagnosis not present

## 2023-11-26 DIAGNOSIS — E1165 Type 2 diabetes mellitus with hyperglycemia: Secondary | ICD-10-CM | POA: Diagnosis not present

## 2023-11-26 DIAGNOSIS — I110 Hypertensive heart disease with heart failure: Secondary | ICD-10-CM | POA: Diagnosis not present

## 2023-11-26 DIAGNOSIS — I10 Essential (primary) hypertension: Secondary | ICD-10-CM | POA: Diagnosis not present

## 2023-11-26 DIAGNOSIS — E118 Type 2 diabetes mellitus with unspecified complications: Secondary | ICD-10-CM | POA: Diagnosis not present

## 2024-02-26 ENCOUNTER — Telehealth: Payer: Self-pay | Admitting: Cardiology

## 2024-02-26 ENCOUNTER — Other Ambulatory Visit: Payer: Self-pay | Admitting: Orthopaedic Surgery

## 2024-02-26 DIAGNOSIS — M79675 Pain in left toe(s): Secondary | ICD-10-CM | POA: Diagnosis not present

## 2024-02-26 NOTE — Telephone Encounter (Signed)
   Name: Shanecia Hoganson  DOB: Mar 24, 1956  MRN: [id number]  Primary Cardiologist: Rollene Rotunda, MD Last OV: 02/20/23  Chart reviewed as part of pre-operative protocol coverage. Because of Gilbert Giammarino's past medical history and time since last visit, she will require a follow-up in-office visit in order to better assess preoperative cardiovascular risk.  Pre-op covering staff: - Please schedule appointment and call patient to inform them. If patient already had an upcoming appointment within acceptable timeframe, please add "pre-op clearance" to the appointment notes so provider is aware. - Please contact requesting surgeon's office via preferred method (i.e, phone, fax) to inform them of need for appointment prior to surgery.  Rip Harbour, NP  02/26/2024, 3:32 PM

## 2024-02-26 NOTE — Telephone Encounter (Signed)
   Pre-operative Risk Assessment    Patient Name: Amanda Mendez  DOB: Dec 10, 1956 MRN: [id number]   Date of last office visit: 02/20/23 Date of next office visit: overdue for 6 month F/U   Request for Surgical Clearance    Procedure:   Left Forth toe amputation   Date of Surgery:  Clearance 03/03/24                                Surgeon: Dr. Nicki Guadalajara Surgeon's Group or Practice Name:  [facility] Phone number:  [phone] Fax number:  [phone]   Type of Clearance Requested:   - Medical  - Pharmacy:  Hold Defer to Cards      Type of Anesthesia:   Choice   Additional requests/questions:    SignedAllean Found   02/26/2024, 3:06 PM

## 2024-02-27 NOTE — Telephone Encounter (Signed)
 Pt is going to need an appt per preop APP as pt also is over due for her 6 mnth f/u. In trying to see where we could get the pt in the office today between the [facility], [facility] and [facility] locations, Perlie Gold, NP stated he would be able to see her at 10:05. Per NP to please let pt know she will to be here and checked in and ready to be seen at 10:05, as he has is going to need a bit more time with her as he has not been able to prechart on the pt.    Left message on her home# and vm on cell is full.   I will update the surgeon office as well as FYI.

## 2024-03-02 ENCOUNTER — Other Ambulatory Visit: Payer: Self-pay

## 2024-03-02 ENCOUNTER — Encounter (HOSPITAL_COMMUNITY): Payer: Self-pay | Admitting: Orthopaedic Surgery

## 2024-03-02 NOTE — Progress Notes (Addendum)
 Left message with Lurena Joiner at Dr. Donnie Mesa office in regards to cardiology clearance.    Update: Per Lurena Joiner, surgery will not be postponed due to no cardiac clearance.

## 2024-03-02 NOTE — Anesthesia Preprocedure Evaluation (Addendum)
 Anesthesia Evaluation  Patient identified by MRN, date of birth, ID band Patient awake    Reviewed: Allergy & Precautions, NPO status , Patient's Chart, lab work & pertinent test results, reviewed documented beta blocker date and time   Airway Mallampati: II  TM Distance: >3 FB Neck ROM: Full    Dental  (+) Teeth Intact, Dental Advisory Given   Pulmonary former smoker   Pulmonary exam normal breath sounds clear to auscultation       Cardiovascular hypertension, Pt. on home beta blockers + CAD, + Cardiac Stents, + CABG and + DOE  Normal cardiovascular exam Rhythm:Regular Rate:Normal     Neuro/Psych  Headaches    GI/Hepatic Neg liver ROS,GERD  Medicated,,  Endo/Other  diabetes, Type 2, Insulin Dependent, Oral Hypoglycemic Agents  Obesity   Renal/GU Renal disease     Musculoskeletal LEFT FOURTH TOE WET GANGRENE, FOREFOOT ABSCESS   Abdominal   Peds  Hematology negative hematology ROS (+)   Anesthesia Other Findings   Reproductive/Obstetrics                             Anesthesia Physical Anesthesia Plan  ASA: 3  Anesthesia Plan: General   Post-op Pain Management: Tylenol PO (pre-op)*   Induction: Intravenous  PONV Risk Score and Plan: 3 and Dexamethasone, Ondansetron and Midazolam  Airway Management Planned: LMA  Additional Equipment:   Intra-op Plan:   Post-operative Plan: Extubation in OR  Informed Consent: I have reviewed the patients History and Physical, chart, labs and discussed the procedure including the risks, benefits and alternatives for the proposed anesthesia with the patient or authorized representative who has indicated his/her understanding and acceptance.     Dental advisory given  Plan Discussed with: CRNA  Anesthesia Plan Comments: (PAT note written 03/02/2024 by Shonna Chock, PA-C.  )       Anesthesia Quick Evaluation

## 2024-03-02 NOTE — Progress Notes (Signed)
 SDW CALL  Patient was given pre-op instructions over the phone. The opportunity was given for the patient to ask questions. No further questions asked. Patient verbalized understanding of instructions given.   PCP - Dr. Elfredia Nevins Cardiologist - Dr. Rollene Rotunda - LOV was 02/20/23      PPM/ICD - denies   Chest x-ray - denies EKG - DOS Stress Test -  10/02/22  ECHO - 10/12/22 Cardiac Cath - 10/03/22  Sleep Study - denies   Fasting Blood Sugar - 120 Checks Blood Sugar __1___ times a day  Last dose of GLP1 agonist-  n/a GLP1 instructions: n/a  Blood Thinner Instructions: n/a Aspirin Instructions: patient instructed to call the surgeon's office for instructions - patient has not taken dose on 3/24  ERAS Protcol - clears until 0415   COVID TEST- pt states she tested positive for covid 1 month ago but all symptoms have resolved   Anesthesia review: yes - cardiac history  Patient denies shortness of breath, fever, cough and chest pain over the phone call   All instructions explained to the patient, with a verbal understanding of the material. Patient agrees to go over the instructions while at home for a better understanding.

## 2024-03-02 NOTE — Progress Notes (Signed)
 Anesthesia Chart Review: SAME DAY WORK-UP  Case: [id number] Date/Time: 03/03/24 0700   Procedures:      AMPUTATION, TOE (Left: Toe) - LEFT FOURTH TOE AMPUTATION, IRRIGATION AND DRIANAGE ABSCESS     IRRIGATION AND DEBRIDEMENT WOUND (Left)   Anesthesia type: Choice   Pre-op diagnosis: LEFT FOURTH TOE WET GANGRENE, FOREFOOT ABSCESS   [facility] 08 / [facility]   Surgeons: Terance Hart, MD       DISCUSSION: Patient is a 68 year old female scheduled for the above procedure.  History includes former smoker (quit 06/11/12), HTN, hypercholesterolemia, exertional dyspnea, CAD (DES OM1 08/14/12; CABG: LIMA-LAD, SVG-DIAG, SVG-RCA 10/12/22), diastolic HF, DM2, GERD, toe amputations (left 5th toe 10/06/22).    Last cardiology visit with Dr. Antoine Poche was on 02/20/23. No additional testing recommended at that visit. Six months follow-up recommended. Dr. Donnie Mesa office reached out to [facility] for preoperative risk assessment, but they would not comment without an office visit which she did not have. Reportedly, given left 4th toe wet gangrene and abscess, surgeon did not feel case could be delayed.   Patient reported last ASA 03/02/24. She reported COVID 1 month ago and has since fully recovered. She uses a walker with ambulation outside of the hose. She denied dyspnea and chest pain. In March 2024, she reported her A1c as 8.5% (no recent labs currently available.)  Anesthesia team to evaluate on the day of surgery.    VS:  Wt Readings from Last 3 Encounters:  02/20/23 73.5 kg  11/14/22 79.4 kg  10/18/22 82.5 kg   BP Readings from Last 3 Encounters:  02/20/23 132/86  11/14/22 (!) 181/87  11/14/22 (!) 156/83   Pulse Readings from Last 3 Encounters:  02/20/23 (!) 104  11/14/22 (!) 114  11/14/22 (!) 109     PROVIDERS: Elfredia Nevins, MD is PCP  Rollene Rotunda, MD is cardiologist  LABS: For day of surgery.    EKG: Last EKG noted is from 10/13/22. For day of surgery as indicated.     CV: TEE (Intraoperative CABG) 10/12/22: POST-OP IMPRESSIONS  - Left Ventricle: The left ventricle is unchanged from pre-bypass. has  Normal systolic function, with an ejection fraction of 60%. The cavity size was normal. The wall motion is normal.  - Right Ventricle: normal function. The cavity was mildly dilated. The wall  motion is normal.  - Aorta: The aorta appears unchanged from pre-bypass.  - Left Atrial Appendage: The left atrial appendage appears unchanged from pre-bypass.  - Aortic Valve: The aortic valve appears unchanged from pre-bypass.  - Mitral Valve: The mitral valve appears unchanged from pre-bypass.  - Tricuspid Valve: The tricuspid valve appears unchanged from pre-bypass.  There is mild regurgitation.  - Pulmonic Valve: The pulmonic valve appears unchanged from pre-bypass. - Interatrial Septum: The interatrial septum appears unchanged from  pre-bypass.  - Pericardium: The pericardium appears unchanged from pre-bypass.  - Comments: Post-bypass images reviewed with surgeon. Mild dilation of  right ventricle improved with time and dopamine infusion post-bypass.   PRE-OP FINDINGS  - Left Ventricle: The left ventricle has normal systolic function, with an  ejection fraction of 60-65%. The cavity size was normal. There is mild  concentric left ventricular hypertrophy.  - Right Ventricle: The right ventricle has normal systolic function. The  cavity was normal. There is no increase in right ventricular wall  thickness.  - Left Atrium: Left atrial size was normal in size. No left atrial/left  atrial appendage thrombus was detected.  Left atrial appendage velocity is  normal at greater than 40 cm/s.  - Right Atrium: Right atrial size was normal in size.  - Interatrial Septum: No atrial level shunt detected by color flow Doppler.  - Pericardium: There is no evidence of pericardial effusion.  - Mitral Valve: The mitral valve is normal in structure. Mitral valve   regurgitation is trivial by color flow Doppler. There is No evidence of  mitral stenosis.  - Tricuspid Valve: The tricuspid valve was normal in structure. Tricuspid  valve regurgitation is mild by color flow Doppler.  - Aortic Valve: The aortic valve is tricuspid Aortic valve regurgitation was  not visualized by color flow Doppler.  - Pulmonic Valve: The pulmonic valve was normal in structure.  Pulmonic valve regurgitation is not visualized by color flow Doppler.  - Aorta: There is evidence of plaque in the ascending aorta, aortic arch and  descending aorta; Grade IV, measuring >44mm in size.  - Pulmonary Artery: The pulmonary artery is of normal size.    US Carotid 10/04/22: Summary:  - Right Carotid: The extracranial vessels were near-normal with only minimal wall thickening or plaque.  - Left Carotid: The extracranial vessels were near-normal with only minimal  wall thickening or plaque.  - Vertebrals:  Bilateral vertebral arteries demonstrate antegrade flow.  - Subclavians: Normal flow hemodynamics were seen in bilateral subclavian arteries.    Echo 10/03/22: IMPRESSIONS:  1. Left ventricular ejection fraction, by estimation, is 70 to 75%. The  left ventricle has hyperdynamic function. The left ventricle has no  regional wall motion abnormalities. There is mild concentric left  ventricular hypertrophy. Left ventricular  diastolic parameters are consistent with Grade I diastolic dysfunction  (impaired relaxation).   2. Right ventricular systolic function is normal. The right ventricular  size is normal.   3. The mitral valve is normal in structure. No evidence of mitral valve  regurgitation. No evidence of mitral stenosis.   4. The aortic valve is normal in structure. Aortic valve regurgitation is  not visualized. No aortic stenosis is present.   5. The inferior vena cava is normal in size with greater than 50%  respiratory variability, suggesting right atrial pressure of 3  mmHg.   Last cardiac cath was on 10/03/22, pre-CABG.  Past Medical History:  Diagnosis Date   Anginal pain (HCC)    Coronary artery disease    angioplasty 9/13   Exertional dyspnea    "usually; today it was with nothing" (11/03/2012)   GERD (gastroesophageal reflux disease)    Hypercholesterolemia with hypertriglyceridemia    Hypertension    Kidney stones    "I've had them 5 times; lithotripsy 1st time; flushed out  all the others" (11/03/2012)   Obesity (BMI 30-39.9)    Pneumonia 2012   Sinus headache    "weekly" (11/03/2012)   Type II diabetes mellitus (HCC) 2007    Past Surgical History:  Procedure Laterality Date   ABDOMINAL AORTOGRAM W/LOWER EXTREMITY Left 09/12/2021   Procedure: ABDOMINAL AORTOGRAM W/LOWER EXTREMITY;  Surgeon: Nada Libman, MD;  [facility];  Service: Cardiovascular;  Laterality: Left;   ABDOMINAL AORTOGRAM W/LOWER EXTREMITY N/A 10/05/2022   Procedure: ABDOMINAL AORTOGRAM W/LOWER EXTREMITY;  Surgeon: Leonie Douglas, MD;  [facility];  Service: Cardiovascular;  Laterality: N/A;   AMPUTATION TOE Left 10/06/2022   Procedure: AMPUTATION LEFT FIFTH TOE;  Surgeon: Felecia Shelling, DPM;  Location: [facility] OR;  Service: Podiatry;  Laterality: Left;   CARDIAC  CATHETERIZATION  11/25/29013   CORONARY ANGIOPLASTY WITH STENT PLACEMENT  08/2012   "1"   CORONARY ARTERY BYPASS GRAFT N/A 10/12/2022   Procedure: CORONARY ARTERY BYPASS GRAFTING (CABG) TIMES THREE USING LEFT INTERNAL MAMMARY ARTERY AND RIGHT LEG GREAT SAPHENOUS VEIN HARVESTED ENDOSCOPICALLY;  Surgeon: Alleen Borne, MD;  Location: [facility];  Service: Open Heart Surgery;  Laterality: N/A;   HEMATOMA EVACUATION  09/22/2012   Procedure: EVACUATION HEMATOMA;  Surgeon: Nicki Reaper, MD;  Location: [facility];  Service: Orthopedics;  Laterality: Right;  Evacuation Hematoma right hand   LEFT AND RIGHT HEART CATHETERIZATION WITH CORONARY ANGIOGRAM N/A 08/14/2012    Procedure: LEFT AND RIGHT HEART CATHETERIZATION WITH CORONARY ANGIOGRAM;  Surgeon: Rollene Rotunda, MD;  Location: [facility] CATH LAB;  Service: Cardiovascular;  Laterality: N/A;   LEFT HEART CATH AND CORONARY ANGIOGRAPHY N/A 10/03/2022   Procedure: LEFT HEART CATH AND CORONARY ANGIOGRAPHY;  Surgeon: Orbie Pyo, MD;  [facility];  Service: Cardiovascular;  Laterality: N/A;   LEFT HEART CATHETERIZATION WITH CORONARY ANGIOGRAM N/A 11/03/2012   Procedure: LEFT HEART CATHETERIZATION WITH CORONARY ANGIOGRAM;  Surgeon: Dolores Patty, MD;  Location: [facility] CATH LAB;  Service: Cardiovascular;  Laterality: N/A;   LITHOTRIPSY  1990's   PERCUTANEOUS CORONARY STENT INTERVENTION (PCI-S)  08/14/2012   Procedure: PERCUTANEOUS CORONARY STENT INTERVENTION (PCI-S);  Surgeon: Rollene Rotunda, MD;  Location: [facility] CATH LAB;  Service: Cardiovascular;;   SKIN BIOPSY  2012   "off my back; it was nothing fatal; infection caused it" (11/03/2012)   TEE WITHOUT CARDIOVERSION N/A 10/12/2022   Procedure: TRANSESOPHAGEAL ECHOCARDIOGRAM (TEE);  Surgeon: Alleen Borne, MD;  Location: [facility] OR;  Service: Open Heart Surgery;  Laterality: N/A;   TUBAL LIGATION  1980    MEDICATIONS: No current facility-administered medications for this encounter.    acetaminophen (TYLENOL) 500 MG tablet   ascorbic acid (VITAMIN C) 500 MG tablet   aspirin EC 325 MG tablet   cholecalciferol (VITAMIN D3) 25 MCG (1000 UNIT) tablet   DULoxetine (CYMBALTA) 30 MG capsule   glimepiride (AMARYL) 4 MG tablet   LANTUS SOLOSTAR 100 UNIT/ML Solostar Pen   losartan-hydrochlorothiazide (HYZAAR) 100-25 MG tablet   metoprolol (LOPRESSOR) 50 MG tablet   omeprazole (PRILOSEC) 20 MG capsule   Polyethylene Glycol 400 (VISINE DRY EYE RELIEF) 1 % SOLN   Pyridoxine HCl (VITAMIN B6) 100 MG TABS   rosuvastatin (CRESTOR) 40 MG tablet   sodium chloride (OCEAN) 0.65 % SOLN nasal spray    Shonna Chock, PA-C Surgical Short Stay/Anesthesiology [facility] Phone  [phone] [facility] Phone [phone] 03/02/2024 4:15 PM

## 2024-03-03 ENCOUNTER — Ambulatory Visit (HOSPITAL_COMMUNITY)
Admission: RE | Admit: 2024-03-03 | Discharge: 2024-03-03 | Disposition: A | Discharge status: Home / Self Care | Attending: Orthopaedic Surgery | Admitting: Orthopaedic Surgery

## 2024-03-03 ENCOUNTER — Other Ambulatory Visit: Payer: Self-pay

## 2024-03-03 ENCOUNTER — Encounter (HOSPITAL_COMMUNITY)
Admission: RE | Disposition: A | Discharge status: Home / Self Care | Payer: Self-pay | Source: Home / Self Care | Attending: Orthopaedic Surgery

## 2024-03-03 ENCOUNTER — Ambulatory Visit (HOSPITAL_BASED_OUTPATIENT_CLINIC_OR_DEPARTMENT_OTHER): Payer: Self-pay | Admitting: Vascular Surgery

## 2024-03-03 ENCOUNTER — Encounter (HOSPITAL_COMMUNITY): Payer: Self-pay | Admitting: Orthopaedic Surgery

## 2024-03-03 ENCOUNTER — Ambulatory Visit (HOSPITAL_COMMUNITY): Payer: Self-pay | Admitting: Vascular Surgery

## 2024-03-03 ENCOUNTER — Other Ambulatory Visit (HOSPITAL_COMMUNITY): Payer: Self-pay

## 2024-03-03 DIAGNOSIS — Z794 Long term (current) use of insulin: Secondary | ICD-10-CM | POA: Diagnosis not present

## 2024-03-03 DIAGNOSIS — Z8616 Personal history of COVID-19: Secondary | ICD-10-CM | POA: Insufficient documentation

## 2024-03-03 DIAGNOSIS — E1152 Type 2 diabetes mellitus with diabetic peripheral angiopathy with gangrene: Secondary | ICD-10-CM | POA: Diagnosis not present

## 2024-03-03 DIAGNOSIS — L02612 Cutaneous abscess of left foot: Secondary | ICD-10-CM | POA: Diagnosis not present

## 2024-03-03 DIAGNOSIS — I110 Hypertensive heart disease with heart failure: Secondary | ICD-10-CM | POA: Insufficient documentation

## 2024-03-03 DIAGNOSIS — Z7984 Long term (current) use of oral hypoglycemic drugs: Secondary | ICD-10-CM | POA: Diagnosis not present

## 2024-03-03 DIAGNOSIS — Z87891 Personal history of nicotine dependence: Secondary | ICD-10-CM | POA: Insufficient documentation

## 2024-03-03 DIAGNOSIS — I2510 Atherosclerotic heart disease of native coronary artery without angina pectoris: Secondary | ICD-10-CM

## 2024-03-03 DIAGNOSIS — Z6831 Body mass index (BMI) 31.0-31.9, adult: Secondary | ICD-10-CM | POA: Insufficient documentation

## 2024-03-03 DIAGNOSIS — Z951 Presence of aortocoronary bypass graft: Secondary | ICD-10-CM | POA: Insufficient documentation

## 2024-03-03 DIAGNOSIS — E669 Obesity, unspecified: Secondary | ICD-10-CM | POA: Diagnosis not present

## 2024-03-03 DIAGNOSIS — Z955 Presence of coronary angioplasty implant and graft: Secondary | ICD-10-CM | POA: Diagnosis not present

## 2024-03-03 DIAGNOSIS — M79672 Pain in left foot: Secondary | ICD-10-CM

## 2024-03-03 DIAGNOSIS — Z7982 Long term (current) use of aspirin: Secondary | ICD-10-CM | POA: Insufficient documentation

## 2024-03-03 DIAGNOSIS — I96 Gangrene, not elsewhere classified: Secondary | ICD-10-CM | POA: Diagnosis not present

## 2024-03-03 DIAGNOSIS — I5032 Chronic diastolic (congestive) heart failure: Secondary | ICD-10-CM | POA: Diagnosis not present

## 2024-03-03 DIAGNOSIS — N1831 Chronic kidney disease, stage 3a: Secondary | ICD-10-CM | POA: Diagnosis not present

## 2024-03-03 DIAGNOSIS — I130 Hypertensive heart and chronic kidney disease with heart failure and stage 1 through stage 4 chronic kidney disease, or unspecified chronic kidney disease: Secondary | ICD-10-CM | POA: Diagnosis not present

## 2024-03-03 HISTORY — PX: INCISION AND DRAINAGE OF WOUND: SHX1803

## 2024-03-03 HISTORY — PX: AMPUTATION TOE: SHX6595

## 2024-03-03 HISTORY — DX: Personal history of urinary calculi: Z87.442

## 2024-03-03 LAB — BASIC METABOLIC PANEL
Anion gap: 15 (ref 5–15)
BUN: 28 mg/dL — ABNORMAL HIGH (ref 8–23)
CO2: 24 mmol/L (ref 22–32)
Calcium: 9.4 mg/dL (ref 8.9–10.3)
Chloride: 96 mmol/L — ABNORMAL LOW (ref 98–111)
Creatinine, Ser: 1.47 mg/dL — ABNORMAL HIGH (ref 0.44–1.00)
GFR, Estimated: 39 mL/min — ABNORMAL LOW (ref >60)
Glucose, Bld: 367 mg/dL — ABNORMAL HIGH (ref 70–99)
Potassium: 3.6 mmol/L (ref 3.5–5.1)
Sodium: 135 mmol/L (ref 135–145)

## 2024-03-03 LAB — CBC
HCT: 40.1 % (ref 36.0–46.0)
Hemoglobin: 13.2 g/dL (ref 12.0–15.0)
MCH: 31.1 pg (ref 26.0–34.0)
MCHC: 32.9 g/dL (ref 30.0–36.0)
MCV: 94.4 fL (ref 80.0–100.0)
Platelets: 312 10*3/uL (ref 150–400)
RBC: 4.25 MIL/uL (ref 3.87–5.11)
RDW: 13 % (ref 11.5–15.5)
WBC: 9.1 10*3/uL (ref 4.0–10.5)
nRBC: 0 % (ref 0.0–0.2)

## 2024-03-03 LAB — GLUCOSE, CAPILLARY
Glucose-Capillary: 382 mg/dL — ABNORMAL HIGH (ref 70–99)
Glucose-Capillary: 267 mg/dL — ABNORMAL HIGH (ref 70–99)

## 2024-03-03 SURGERY — AMPUTATION, TOE
Anesthesia: General | Site: Toe | Laterality: Left

## 2024-03-03 MED ORDER — LIDOCAINE 2% (20 MG/ML) 5 ML SYRINGE
INTRAMUSCULAR | Status: DC | PRN
  Administered 2024-03-03: 80 mg via INTRAVENOUS

## 2024-03-03 MED ORDER — CEFAZOLIN SODIUM-DEXTROSE 2-4 GM/100ML-% IV SOLN
2.0000 g | INTRAVENOUS | Status: AC
Start: 2024-03-03 — End: 2024-03-03
  Administered 2024-03-03: 2 g via INTRAVENOUS
  Filled 2024-03-03: qty 100

## 2024-03-03 MED ORDER — 0.9 % SODIUM CHLORIDE (POUR BTL) OPTIME
TOPICAL | Status: DC | PRN
  Administered 2024-03-03: 1000 mL

## 2024-03-03 MED ORDER — ONDANSETRON HCL 4 MG/2ML IJ SOLN
INTRAMUSCULAR | Status: AC
  Filled 2024-03-03: qty 2

## 2024-03-03 MED ORDER — LIDOCAINE 2% (20 MG/ML) 5 ML SYRINGE
INTRAMUSCULAR | Status: AC
  Filled 2024-03-03: qty 5

## 2024-03-03 MED ORDER — INSULIN ASPART 100 UNIT/ML IJ SOLN
0.0000 [IU] | INTRAMUSCULAR | Status: DC | PRN
  Administered 2024-03-03: 14 [IU] via SUBCUTANEOUS
  Filled 2024-03-03: qty 1

## 2024-03-03 MED ORDER — BUPIVACAINE HCL (PF) 0.25 % IJ SOLN
INTRAMUSCULAR | Status: DC | PRN
  Administered 2024-03-03: 20 mL

## 2024-03-03 MED ORDER — FENTANYL CITRATE (PF) 100 MCG/2ML IJ SOLN: 25.0000 ug | INTRAMUSCULAR | Status: DC | PRN

## 2024-03-03 MED ORDER — ACETAMINOPHEN 500 MG PO TABS
1000.0000 mg | ORAL_TABLET | Freq: Once | ORAL | Status: AC
  Administered 2024-03-03: 1000 mg via ORAL
  Filled 2024-03-03: qty 2

## 2024-03-03 MED ORDER — PROPOFOL 10 MG/ML IV BOLUS
INTRAVENOUS | Status: DC | PRN
  Administered 2024-03-03: 130 mg via INTRAVENOUS

## 2024-03-03 MED ORDER — ONDANSETRON HCL 4 MG/2ML IJ SOLN
INTRAMUSCULAR | Status: DC | PRN
  Administered 2024-03-03: 4 mg via INTRAVENOUS

## 2024-03-03 MED ORDER — CHLORHEXIDINE GLUCONATE 0.12 % MT SOLN: 15.0000 mL | Freq: Once | OROMUCOSAL | Status: AC

## 2024-03-03 MED ORDER — ORAL CARE MOUTH RINSE: 15.0000 mL | Freq: Once | OROMUCOSAL | Status: AC

## 2024-03-03 MED ORDER — FENTANYL CITRATE (PF) 250 MCG/5ML IJ SOLN
INTRAMUSCULAR | Status: AC
  Filled 2024-03-03: qty 5

## 2024-03-03 MED ORDER — CEFADROXIL 500 MG PO CAPS
500.0000 mg | ORAL_CAPSULE | Freq: Two times a day (BID) | ORAL | 0 refills | Status: DC
  Filled 2024-03-03: qty 14, 7d supply, fill #0

## 2024-03-03 MED ORDER — MIDAZOLAM HCL 2 MG/2ML IJ SOLN
INTRAMUSCULAR | Status: AC
  Filled 2024-03-03: qty 2

## 2024-03-03 MED ORDER — ONDANSETRON HCL 4 MG/2ML IJ SOLN: 4.0000 mg | Freq: Once | INTRAMUSCULAR | Status: DC | PRN

## 2024-03-03 MED ORDER — PROPOFOL 10 MG/ML IV BOLUS
INTRAVENOUS | Status: AC
  Filled 2024-03-03: qty 20

## 2024-03-03 MED ORDER — LACTATED RINGERS IV SOLN: INTRAVENOUS | Status: DC

## 2024-03-03 MED ORDER — BUPIVACAINE HCL (PF) 0.25 % IJ SOLN
INTRAMUSCULAR | Status: AC
  Filled 2024-03-03: qty 30

## 2024-03-03 MED ORDER — MIDAZOLAM HCL 2 MG/2ML IJ SOLN
INTRAMUSCULAR | Status: DC | PRN
  Administered 2024-03-03: 2 mg via INTRAVENOUS

## 2024-03-03 MED ORDER — OXYCODONE HCL 5 MG PO TABS
5.0000 mg | ORAL_TABLET | ORAL | 0 refills | Status: DC | PRN
  Filled 2024-03-03: qty 20, 4d supply, fill #0

## 2024-03-03 MED ORDER — CHLORHEXIDINE GLUCONATE 0.12 % MT SOLN
OROMUCOSAL | Status: AC
  Administered 2024-03-03: 15 mL via OROMUCOSAL
  Filled 2024-03-03: qty 15

## 2024-03-03 MED ORDER — FENTANYL CITRATE (PF) 250 MCG/5ML IJ SOLN
INTRAMUSCULAR | Status: DC | PRN
  Administered 2024-03-03 (×2): 25 ug via INTRAVENOUS

## 2024-03-03 SURGICAL SUPPLY — 31 items
BAG COUNTER SPONGE SURGICOUNT (BAG) IMPLANT
BNDG ELASTIC 4INX 5YD STR LF (GAUZE/BANDAGES/DRESSINGS) IMPLANT
BNDG ELASTIC 4X5.8 VLCR STR LF (GAUZE/BANDAGES/DRESSINGS) ×2 IMPLANT
BNDG ESMARK 4X9 LF (GAUZE/BANDAGES/DRESSINGS) IMPLANT
BNDG GAUZE DERMACEA FLUFF 4 (GAUZE/BANDAGES/DRESSINGS) ×2 IMPLANT
COVER SURGICAL LIGHT HANDLE (MISCELLANEOUS) ×4 IMPLANT
DRAPE U-SHAPE 47X51 STRL (DRAPES) ×2 IMPLANT
DRSG XEROFORM 1X8 (GAUZE/BANDAGES/DRESSINGS) IMPLANT
DURAPREP 26ML APPLICATOR (WOUND CARE) ×2 IMPLANT
ELECT REM PT RETURN 9FT ADLT (ELECTROSURGICAL) ×2 IMPLANT
ELECTRODE REM PT RTRN 9FT ADLT (ELECTROSURGICAL) ×2 IMPLANT
GAUZE SPONGE 4X4 12PLY STRL (GAUZE/BANDAGES/DRESSINGS) IMPLANT
GLOVE BIOGEL M STRL SZ7.5 (GLOVE) ×2 IMPLANT
GLOVE INDICATOR 8.0 STRL GRN (GLOVE) ×2 IMPLANT
GLOVE SRG 8 PF TXTR STRL LF DI (GLOVE) ×2 IMPLANT
GOWN STRL REUS W/ TWL LRG LVL3 (GOWN DISPOSABLE) ×2 IMPLANT
GOWN STRL REUS W/ TWL XL LVL3 (GOWN DISPOSABLE) ×2 IMPLANT
KIT BASIN OR (CUSTOM PROCEDURE TRAY) ×2 IMPLANT
KIT TURNOVER KIT B (KITS) ×2 IMPLANT
MANIFOLD NEPTUNE II (INSTRUMENTS) ×2 IMPLANT
NDL 22X1.5 STRL (OR ONLY) (MISCELLANEOUS) IMPLANT
NEEDLE 22X1.5 STRL (OR ONLY) (MISCELLANEOUS) IMPLANT
NS IRRIG 1000ML POUR BTL (IV SOLUTION) ×2 IMPLANT
PACK ORTHO EXTREMITY (CUSTOM PROCEDURE TRAY) ×2 IMPLANT
PAD ARMBOARD POSITIONER FOAM (MISCELLANEOUS) ×4 IMPLANT
PAD CAST 4YDX4 CTTN HI CHSV (CAST SUPPLIES) IMPLANT
SUT ETHILON 2 0 FS 18 (SUTURE) ×2 IMPLANT
SYR CONTROL 10ML LL (SYRINGE) IMPLANT
TOWEL GREEN STERILE (TOWEL DISPOSABLE) ×2 IMPLANT
TUBE CONNECTING 20X1/4 (TUBING) ×2 IMPLANT
YANKAUER SUCT BULB TIP NO VENT (SUCTIONS) ×2 IMPLANT

## 2024-03-03 NOTE — Op Note (Signed)
 Amanda Mendez female 68 y.o. 03/03/2024  PreOperative Diagnosis: Left fourth toe gangrene Left forefoot abscess, multiple bursal spaces  PostOperative Diagnosis: Same  PROCEDURE: Left fourth toe amputation, MP joint Incision and drainage left forefoot abscess multiple bursal spaces  SURGEON: Dub Mikes, MD  ASSISTANT: None  ANESTHESIA: General LMA with infiltration of quarter percent Marcaine plain  FINDINGS: Wet gangrene of fourth toe  IMPLANTS: None  INDICATIONS:67 y.o. female presented to the clinic with necrotic and gangrenous fourth toe.  She had a history of fifth toe amputation.  She states that a year ago she injured the toe but never sought medical treatment.  The toe turned black and was causing redness and swelling in her foot.  There was concern for foot abscess as well.  She was indicated for surgery.  Initiated dressing sure yeah that is fine   patient understood the risks, benefits and alternatives to surgery which include but are not limited to wound healing complications, infection, nonunion, malunion, need for further surgery as well as damage to surrounding structures. They also understood the potential for continued pain in that there were no guarantees of acceptable outcome After weighing these risks the patient opted to proceed with surgery.  PROCEDURE: Patient was identified in the preoperative holding area.  The left leg was marked by myself.  Consent was signed by myself and the patient.  Block was performed by anesthesia in the preoperative holding area.  Patient was taken to the operative suite and placed supine on the operative table.  General LMA anesthesia was induced without difficulty. Bump was placed under the operative hip and bone foam was used.  All bony prominences were well padded.  Tourniquet was placed on the operative thigh.  Preoperative antibiotics were given. The extremity was prepped and draped in the usual sterile fashion and  surgical timeout was performed.  The limb was elevated and the tourniquet was inflated to 250 mmHg.  We began by assessing the toe.  There was some rolled skin edges and attempted autoamputation.  The toe was held on by the long flexor tendon.  Knife was used cutting sharply through skin and subcutaneous tissue to complete the amputation at the demarcation line.  Then using a 15 blade the rolled skin edges were removed.  Incision was made in an elliptical fashion at this stump site.  The incision was then carried deep.  The remaining portion of the proximal phalanx was fully dissected and transected at the MP joint.  Then hemostat was used to pull the long flexor tendon and perform a traction tenotomy.  Deep tissue and bone was sent for culture.  Then the wound was irrigated with normal saline.  There was surrounding redness about the forefoot.  Using hemostat we are able to open up the multiple bursal spaces to allow for drainage of fluid and purulent material.  Then the wound was reirrigated with normal saline.  Then it was inspected and there was found to be bleeding skin edges that would allow for tension-free closure.  2-0 nylon suture was used to close the wound.  Using quarter percent Marcaine plain the area around the wound was infiltrated with local anesthetic.  20 cc was used.  Soft dressing was in place including Xeroform, 4 x 4's and sterile sheet cotton.  Ace wrap was placed.  No tourniquet was used.  She was awakened from anesthesia and taken recovery stable condition.  POST OPERATIVE INSTRUCTIONS: Heel weightbearing in a postoperative shoe Keep dressing in place  Follow-up for suture removal May resume anticoagulation  TOURNIQUET TIME:none  BLOOD LOSS:  Minimal         DRAINS: none         SPECIMEN: none       COMPLICATIONS:  * No complications entered in OR log *         Disposition: PACU - hemodynamically stable.         Condition: stable

## 2024-03-03 NOTE — Progress Notes (Signed)
 Orthopedic Tech Progress Note Patient Details:  Amanda Mendez 15-Mar-1956 [id number]  Ortho Devices Type of Ortho Device: Postop shoe/boot Ortho Device/Splint Location: LLE Ortho Device/Splint Interventions: Ordered, Application, Adjustment, RemovalPACU RN called requesting a POST OP SHOE    Post Interventions Patient Tolerated: Well Instructions Provided: Care of device  [name] 03/03/2024, 8:23 AM

## 2024-03-03 NOTE — Transfer of Care (Signed)
 Immediate Anesthesia Transfer of Care Note  Patient: Amanda Mendez  Procedure(s) Performed: AMPUTATION, TOE (Left: Toe) IRRIGATION AND DEBRIDEMENT WOUND (Left)  Patient Location: PACU  Anesthesia Type:General  Level of Consciousness: sedated  Airway & Oxygen Therapy: Patient Spontanous Breathing and Patient connected to nasal cannula oxygen  Post-op Assessment: Report given to RN and Post -op Vital signs reviewed and stable  Post vital signs: Reviewed and stable  Last Vitals:  Vitals Value Taken Time  BP 131/71 03/03/24 0750  Temp    Pulse 69 03/03/24 0753  Resp 14 03/03/24 0753  SpO2 91 % 03/03/24 0753  Vitals shown include unfiled device data.  Last Pain:  Vitals:   03/03/24 0623  TempSrc:   PainSc: 0-No pain         Complications: No notable events documented.

## 2024-03-03 NOTE — Anesthesia Postprocedure Evaluation (Signed)
 Anesthesia Post Note  Patient: Amanda Mendez  Procedure(s) Performed: AMPUTATION, TOE (Left: Toe) IRRIGATION AND DEBRIDEMENT WOUND (Left)     Patient location during evaluation: PACU Anesthesia Type: General Level of consciousness: awake and alert Pain management: pain level controlled Vital Signs Assessment: post-procedure vital signs reviewed and stable Respiratory status: spontaneous breathing, nonlabored ventilation and respiratory function stable Cardiovascular status: blood pressure returned to baseline and stable Postop Assessment: no apparent nausea or vomiting Anesthetic complications: no   No notable events documented.  Last Vitals:  Vitals:   03/03/24 0830 03/03/24 0832  BP: (!) 152/74   Pulse: 71 70  Resp: 14 16  Temp:  36.7 C  SpO2: 96% 96%    Last Pain:  Vitals:   03/03/24 0832  TempSrc:   PainSc: 0-No pain                 Collene Schlichter

## 2024-03-03 NOTE — Discharge Instructions (Signed)
 DR. Susa Simmonds FOOT & ANKLE SURGERY POST-OP INSTRUCTIONS   Pain Management The numbing medicine and your leg will last around 18 hours, take a dose of your pain medicine as soon as you feel it wearing off to avoid rebound pain. Keep your foot elevated above heart level.  Make sure that your heel hangs free ('floats'). Take all prescribed medication as directed. If taking narcotic pain medication you may want to use an over-the-counter stool softener to avoid constipation. You may take over-the-counter NSAIDs (ibuprofen, naproxen, etc.) as well as over-the-counter acetaminophen as directed on the packaging as a supplement for your pain and may also use it to wean away from the prescription medication.  Activity Heel WB in post operative shoe. Keep dressing in place  First Postoperative Visit Your first postop visit will be at least 2 weeks after surgery.  This should be scheduled when you schedule surgery. If you do not have a postoperative visit scheduled please call [phone] to schedule an appointment. At the appointment your incision will be evaluated for suture removal, x-rays will be obtained if necessary.  General Instructions Swelling is very common after foot and ankle surgery.  It often takes 3 months for the foot and ankle to begin to feel comfortable.  Some amount of swelling will persist for 6-12 months. DO NOT change the dressing.  If there is a problem with the dressing (too tight, loose, gets wet, etc.) please contact Dr. Donnie Mesa office. DO NOT get the dressing wet.  For showers you can use an over-the-counter cast cover or wrap a washcloth around the top of your dressing and then cover it with a plastic bag and tape it to your leg. DO NOT soak the incision (no tubs, pools, bath, etc.) until you have approval from Dr. Susa Simmonds.  Contact Dr. Garret Reddish office or go to Emergency Room if: Temperature above 101 F. Increasing pain that is unresponsive to pain medication or  elevation Excessive redness or swelling in your foot Dressing problems - excessive bloody drainage, looseness or tightness, or if dressing gets wet Develop pain, swelling, warmth, or discoloration of your calf

## 2024-03-03 NOTE — Anesthesia Procedure Notes (Signed)
 Procedure Name: LMA Insertion Date/Time: 03/03/2024 7:20 AM  Performed by: Einar Grad, CRNAPre-anesthesia Checklist: Patient identified, Emergency Drugs available, Suction available and Patient being monitored Patient Re-evaluated:Patient Re-evaluated prior to induction Oxygen Delivery Method: Circle System Utilized Preoxygenation: Pre-oxygenation with 100% oxygen Induction Type: IV induction Ventilation: Mask ventilation without difficulty LMA: LMA inserted LMA Size: 4.0 Number of attempts: 1 Airway Equipment and Method: Bite block Placement Confirmation: positive ETCO2 Tube secured with: Tape Dental Injury: Teeth and Oropharynx as per pre-operative assessment

## 2024-03-03 NOTE — H&P (Signed)
 PREOPERATIVE H&P  Chief Complaint: Left fourth toe gangrene with abscess  HPI: Amanda Mendez is a 68 y.o. female who presents for preoperative history and physical with a diagnosis of left fourth toe gangrene with abscess.  She injured her toe about a year ago and it went on to gangrene.  She showed up in the clinic with gangrenous toe and forefoot cellulitis with abscess.  She is indicated for surgery.  She is here today for surgery. Symptoms are rated as moderate to severe, and have been worsening.  This is significantly impairing activities of daily living.  She has elected for surgical management.   Past Medical History:  Diagnosis Date   Anginal pain (HCC)    Coronary artery disease    angioplasty 9/13   Exertional dyspnea    "usually; today it was with nothing" (11/03/2012)   GERD (gastroesophageal reflux disease)    History of kidney stones    Hypercholesterolemia with hypertriglyceridemia    Hypertension    Kidney stones    "I've had them 5 times; lithotripsy 1st time; flushed out  all the others" (11/03/2012)   Obesity (BMI 30-39.9)    Pneumonia 12/10/2010   Sinus headache    "weekly" (11/03/2012)   Type II diabetes mellitus (HCC) 12/10/2005   Past Surgical History:  Procedure Laterality Date   ABDOMINAL AORTOGRAM W/LOWER EXTREMITY Left 09/12/2021   Procedure: ABDOMINAL AORTOGRAM W/LOWER EXTREMITY;  Surgeon: Nada Libman, MD;  Location: [facility];  Service: Cardiovascular;  Laterality: Left;   ABDOMINAL AORTOGRAM W/LOWER EXTREMITY N/A 10/05/2022   Procedure: ABDOMINAL AORTOGRAM W/LOWER EXTREMITY;  Surgeon: Leonie Douglas, MD;  Location: [facility];  Service: Cardiovascular;  Laterality: N/A;   AMPUTATION TOE Left 10/06/2022   Procedure: AMPUTATION LEFT FIFTH TOE;  Surgeon: Felecia Shelling, DPM;  Location: [facility] OR;  Service: Podiatry;  Laterality: Left;   CARDIAC CATHETERIZATION  11/25/29013   CORONARY ANGIOPLASTY WITH STENT PLACEMENT  08/2012   "1"    CORONARY ARTERY BYPASS GRAFT N/A 10/12/2022   Procedure: CORONARY ARTERY BYPASS GRAFTING (CABG) TIMES THREE USING LEFT INTERNAL MAMMARY ARTERY AND RIGHT LEG GREAT SAPHENOUS VEIN HARVESTED ENDOSCOPICALLY;  Surgeon: Alleen Borne, MD;  Location: [facility] OR;  Service: Open Heart Surgery;  Laterality: N/A;   HEMATOMA EVACUATION  09/22/2012   Procedure: EVACUATION HEMATOMA;  Surgeon: Nicki Reaper, MD;  Location: [facility];  Service: Orthopedics;  Laterality: Right;  Evacuation Hematoma right hand   LEFT AND RIGHT HEART CATHETERIZATION WITH CORONARY ANGIOGRAM N/A 08/14/2012   Procedure: LEFT AND RIGHT HEART CATHETERIZATION WITH CORONARY ANGIOGRAM;  Surgeon: Rollene Rotunda, MD;  [facility]: [facility] CATH LAB;  Service: Cardiovascular;  Laterality: N/A;   LEFT HEART CATH AND CORONARY ANGIOGRAPHY N/A 10/03/2022   Procedure: LEFT HEART CATH AND CORONARY ANGIOGRAPHY;  Surgeon: Orbie Pyo, MD;  Location: [facility];  Service: Cardiovascular;  Laterality: N/A;   LEFT HEART CATHETERIZATION WITH CORONARY ANGIOGRAM N/A 11/03/2012   Procedure: LEFT HEART CATHETERIZATION WITH CORONARY ANGIOGRAM;  Surgeon: Dolores Patty, MD;  Location: [facility] CATH LAB;  Service: Cardiovascular;  Laterality: N/A;   LITHOTRIPSY  1990's   PERCUTANEOUS CORONARY STENT INTERVENTION (PCI-S)  08/14/2012   Procedure: PERCUTANEOUS CORONARY STENT INTERVENTION (PCI-S);  Surgeon: Rollene Rotunda, MD;  Location: [facility] CATH LAB;  Service: Cardiovascular;;   SKIN BIOPSY  2012   "off my back; it was nothing fatal; infection caused it" (11/03/2012)   TEE WITHOUT CARDIOVERSION N/A 10/12/2022   Procedure:  TRANSESOPHAGEAL ECHOCARDIOGRAM (TEE);  Surgeon: Alleen Borne, MD;  Location: [facility] OR;  Service: Open Heart Surgery;  Laterality: N/A;   TUBAL LIGATION  1980   Social History   Socioeconomic History   Marital status: Married    Spouse name: Not on file   Number of children: Not on file   Years of education: Not on file   Highest  education level: Not on file  Occupational History   Not on file  Tobacco Use   Smoking status: Former    Current packs/day: 0.00    Average packs/day: 1.5 packs/day for 37.0 years (55.5 ttl pk-yrs)    Types: Cigarettes    Start date: 06/12/1975    Quit date: 06/11/2012    Years since quitting: 11.7   Smokeless tobacco: Never  Vaping Use   Vaping status: Never Used  Substance and Sexual Activity   Alcohol use: Never    Comment: 11/03/2012 "aien't drank none in ~ 10 yr; then it was just an occasional daquiri on vacation"   Drug use: Never   Sexual activity: Not Currently  Other Topics Concern   Not on file  Social History Narrative   pt living with son, daughter in-law and grandson in single story home with ramped entrance. Pt reports that prior to hospitalization she was using a quad cane for ambulation due to increased LE pain with ambulation.    Pt working as Runner, broadcasting/film/video of adults with autism with decreasing standing tolerance.    Social Drivers of Corporate investment banker Strain: Not on file  Food Insecurity: Food Insecurity Present (10/17/2022)   Hunger Vital Sign    Worried About Running Out of Food in the Last Year: Sometimes true    Ran Out of Food in the Last Year: Sometimes true  Transportation Needs: No Transportation Needs (10/02/2022)   PRAPARE - Administrator, Civil Service (Medical): No    Lack of Transportation (Non-Medical): No  Physical Activity: Not on file  Stress: No Stress Concern Present (09/21/2021)   [employer] of Occupational Health - Occupational Stress Questionnaire    Feeling of Stress : Only a little  Social Connections: Not on file   Family History  Problem Relation Age of Onset   Heart failure Mother        Died in her 60s, had angina starting in her 86s   Crohn's disease Brother    Allergies  Allergen Reactions   Farxiga [Dapagliflozin] Itching   Codeine Nausea And Vomiting    But has tolerated morphine   Invokana  [Canagliflozin] Other (See Comments)    UTI   Prior to Admission medications   Medication Sig Start Date End Date Taking? Authorizing Provider  acetaminophen (TYLENOL) 500 MG tablet Take 500 mg by mouth every 4 (four) hours as needed for moderate pain or mild pain.   Yes [provider]  ascorbic acid (VITAMIN C) 500 MG tablet Take 500 mg by mouth daily.   Yes [provider]  aspirin EC 325 MG tablet Take 1 tablet (325 mg total) by mouth daily. 10/18/22  Yes Roddenberry, Cecille Amsterdam, PA-C  cholecalciferol (VITAMIN D3) 25 MCG (1000 UNIT) tablet Take 1,000 Units by mouth daily.   Yes [provider]  DULoxetine (CYMBALTA) 30 MG capsule Take 30 mg by mouth daily.   Yes [provider]  glimepiride (AMARYL) 4 MG tablet Take 2 mg by mouth 2 (two) times daily. Patient taking once daily 04/06/22  Yes  [provider]  LANTUS SOLOSTAR 100 UNIT/ML Solostar Pen Inject 50-60 Units into the skin daily. 07/16/22  Yes [provider]  losartan-hydrochlorothiazide (HYZAAR) 100-25 MG tablet Take 1 tablet by mouth once daily 09/27/23  Yes Hochrein, Fayrene Fearing, MD  metoprolol (LOPRESSOR) 50 MG tablet Take 50 mg by mouth 2 (two) times daily.   Yes [provider]  omeprazole (PRILOSEC) 20 MG capsule Take 20 mg by mouth daily.   Yes [provider]  Polyethylene Glycol 400 (VISINE DRY EYE RELIEF) 1 % SOLN Place 1 drop into both eyes as needed (dry eyes).   Yes [provider]  Pyridoxine HCl (VITAMIN B6) 100 MG TABS Take 100 mg by mouth daily.   Yes [provider]  rosuvastatin (CRESTOR) 40 MG tablet Take 40 mg by mouth every evening. 11/28/23  Yes [provider]  sodium chloride (OCEAN) 0.65 % SOLN nasal spray Place 1 spray into both nostrils as needed for congestion.   Yes [provider]     Positive ROS: All other systems have been reviewed and were otherwise negative with the exception of those mentioned in the  HPI and as above.  Physical Exam:  Vitals:   03/03/24 0551  BP: (!) 191/86  Pulse: 62  Resp: 17  Temp: 98.3 F (36.8 C)  SpO2: 96%   General: Alert, no acute distress Cardiovascular: No pedal edema Respiratory: No cyanosis, no use of accessory musculature GI: No organomegaly, abdomen is soft and non-tender Skin: No lesions in the area of chief complaint Neurologic: Sensation intact distally Psychiatric: Patient is competent for consent with normal mood and affect Lymphatic: No axillary or cervical lymphadenopathy  MUSCULOSKELETAL: Left foot demonstrates swelling and erythema.  History and evidence of prior fifth toe amputation.  Fourth toe is black and draining well.  Gangrenous toe.  Sensory deficits present.  Foot is warm with weakly palpable DP  Assessment: Left fourth toe gangrene with surrounding cellulitis and concern for abscess in the setting of diabetes   Plan: Plan for left fourth toe amputation with incision and drainage of forefoot abscess.  Patient was placed on antibiotics in the clinic and will likely be discharged with outpatient antibiotics.  We discussed the risks, benefits and alternatives of surgery which include but are not limited to wound healing complications, infection, nonunion, malunion, need for further surgery, damage to surrounding structures and continued pain.  They understand there is no guarantees to an acceptable outcome.  After weighing these risks they opted to proceed with surgery.     Terance Hart, MD    03/03/2024 6:23 AM

## 2024-03-04 ENCOUNTER — Encounter (HOSPITAL_COMMUNITY): Payer: Self-pay | Admitting: Orthopaedic Surgery

## 2024-03-08 LAB — AEROBIC/ANAEROBIC CULTURE W GRAM STAIN (SURGICAL/DEEP WOUND): Culture: NORMAL

## 2024-07-29 DIAGNOSIS — E1142 Type 2 diabetes mellitus with diabetic polyneuropathy: Secondary | ICD-10-CM | POA: Diagnosis not present

## 2024-07-29 DIAGNOSIS — Z89422 Acquired absence of other left toe(s): Secondary | ICD-10-CM | POA: Diagnosis not present

## 2024-08-12 DIAGNOSIS — B349 Viral infection, unspecified: Secondary | ICD-10-CM | POA: Diagnosis not present

## 2024-08-25 ENCOUNTER — Other Ambulatory Visit: Payer: Self-pay | Admitting: Cardiology

## 2024-09-07 ENCOUNTER — Telehealth: Payer: Self-pay

## 2024-09-07 ENCOUNTER — Encounter: Payer: Self-pay | Admitting: Student in an Organized Health Care Education/Training Program

## 2024-09-07 ENCOUNTER — Other Ambulatory Visit

## 2024-09-07 ENCOUNTER — Ambulatory Visit (INDEPENDENT_AMBULATORY_CARE_PROVIDER_SITE_OTHER): Admitting: Student in an Organized Health Care Education/Training Program

## 2024-09-07 VITALS — BP 172/78 | HR 87 | Ht 61.5 in | Wt 167.0 lb

## 2024-09-07 DIAGNOSIS — R5381 Other malaise: Secondary | ICD-10-CM | POA: Diagnosis not present

## 2024-09-07 DIAGNOSIS — G250 Essential tremor: Secondary | ICD-10-CM | POA: Diagnosis not present

## 2024-09-07 DIAGNOSIS — N1831 Chronic kidney disease, stage 3a: Secondary | ICD-10-CM | POA: Diagnosis not present

## 2024-09-07 DIAGNOSIS — E1122 Type 2 diabetes mellitus with diabetic chronic kidney disease: Secondary | ICD-10-CM | POA: Diagnosis not present

## 2024-09-07 DIAGNOSIS — M810 Age-related osteoporosis without current pathological fracture: Secondary | ICD-10-CM

## 2024-09-07 DIAGNOSIS — I10 Essential (primary) hypertension: Secondary | ICD-10-CM | POA: Diagnosis not present

## 2024-09-07 DIAGNOSIS — Z1231 Encounter for screening mammogram for malignant neoplasm of breast: Secondary | ICD-10-CM

## 2024-09-07 DIAGNOSIS — E7800 Pure hypercholesterolemia, unspecified: Secondary | ICD-10-CM | POA: Diagnosis not present

## 2024-09-07 DIAGNOSIS — I998 Other disorder of circulatory system: Secondary | ICD-10-CM | POA: Diagnosis not present

## 2024-09-07 LAB — LIPID PANEL
Cholesterol: 265 mg/dL — ABNORMAL HIGH (ref 0–200)
HDL: 53.5 mg/dL (ref >39.00)
LDL Cholesterol: 133 mg/dL — ABNORMAL HIGH (ref 0–99)
NonHDL: 211.67
Total CHOL/HDL Ratio: 5
Triglycerides: 395 mg/dL — ABNORMAL HIGH (ref 0.0–149.0)
VLDL: 79 mg/dL — ABNORMAL HIGH (ref 0.0–40.0)

## 2024-09-07 LAB — POCT GLYCOSYLATED HEMOGLOBIN (HGB A1C): Hemoglobin A1C: 10.8 % — AB (ref 4.0–5.6)

## 2024-09-07 LAB — BASIC METABOLIC PANEL WITH GFR
BUN: 24 mg/dL — ABNORMAL HIGH (ref 6–23)
CO2: 27 meq/L (ref 19–32)
Calcium: 9.4 mg/dL (ref 8.4–10.5)
Chloride: 92 meq/L — ABNORMAL LOW (ref 96–112)
Creatinine, Ser: 1.65 mg/dL — ABNORMAL HIGH (ref 0.40–1.20)
GFR: 31.63 mL/min — ABNORMAL LOW (ref >60.00)
Glucose, Bld: 520 mg/dL (ref 70–99)
Potassium: 4 meq/L (ref 3.5–5.1)
Sodium: 132 meq/L — ABNORMAL LOW (ref 135–145)

## 2024-09-07 LAB — MICROALBUMIN / CREATININE URINE RATIO
Creatinine,U: 82.8 mg/dL
Microalb Creat Ratio: 2011.4 mg/g — ABNORMAL HIGH (ref 0.0–30.0)
Microalb, Ur: 166.4 mg/dL — ABNORMAL HIGH (ref 0.0–1.9)

## 2024-09-07 LAB — VITAMIN D 25 HYDROXY (VIT D DEFICIENCY, FRACTURES): VITD: 18.03 ng/mL — ABNORMAL LOW (ref 30.00–100.00)

## 2024-09-07 MED ORDER — ROSUVASTATIN CALCIUM 40 MG PO TABS: 40.0000 mg | ORAL_TABLET | Freq: Every evening | ORAL | 3 refills | Status: AC

## 2024-09-07 MED ORDER — LOSARTAN POTASSIUM-HCTZ 100-25 MG PO TABS
1.0000 | ORAL_TABLET | Freq: Every day | ORAL | 0 refills | Status: DC
Start: 2024-09-07 — End: 2024-10-05

## 2024-09-07 MED ORDER — LANTUS SOLOSTAR 100 UNIT/ML ~~LOC~~ SOPN
40.0000 [IU] | PEN_INJECTOR | Freq: Every day | SUBCUTANEOUS | 2 refills | Status: AC
Start: 2024-09-07 — End: ?

## 2024-09-07 MED ORDER — CARVEDILOL 6.25 MG PO TABS: 6.2500 mg | ORAL_TABLET | Freq: Two times a day (BID) | ORAL | 3 refills | Status: AC

## 2024-09-07 NOTE — Assessment & Plan Note (Signed)
 Diabetes is poorly controlled with an A1c of 10.8%, posing a high risk for complications. Her current regimen includes Lantus  and glimepiride , but adherence is inconsistent due to cost. She is open to medication adjustments and dietary improvements. Contacted the pharmacist to review insurance and explore affordable diabetes medication options. Continue Lantus , adjusting to 40 units daily in the evening.  Will stop glimepiride  at this point.  I think there are better agents that we can get access to.  Would like to access an SGLT2 inhibitor due to her comorbid CKD.  I think a GLP-1 agonist would also be helpful with her comorbid heart disease.  Instruct her to check blood sugars every morning. Schedule a follow-up in one month to review blood sugars and medication adherence.  Next

## 2024-09-07 NOTE — Patient Instructions (Signed)
  VISIT SUMMARY: Today, we discussed the management of your diabetes and cardiovascular health. We reviewed your current medications and made some adjustments to better control your conditions. We also addressed your recurrent falls, chronic pain, and essential tremor.  YOUR PLAN: -TYPE 2 DIABETES MELLITUS WITH POOR GLYCEMIC CONTROL AND DIABETIC CHRONIC KIDNEY DISEASE: Your diabetes is not well controlled, which increases the risk of complications. We will continue your Lantus  at 40 units daily in the evening and have you check your blood sugars every morning. We will also work with a pharmacist to find more affordable medication options. Please follow up in one month to review your blood sugars and medication adherence.  -CORONARY ARTERY DISEASE, POST CORONARY ARTERY BYPASS GRAFT: You had a triple bypass surgery two years ago and have not experienced chest pain since. It is important to control your blood pressure and diabetes to prevent further heart issues. Continue taking rosuvastatin , losartan , and aspirin . We are switching your metoprolol  to carvedilol  for better blood pressure control. Please monitor your blood pressure regularly.  -HYPERTENSION: Your high blood pressure needs to be well managed to prevent heart problems. Continue taking losartan  as part of the Hyzaar combination and switch from metoprolol  to carvedilol  for better control. Monitor your blood pressure regularly.  -HYPERLIPIDEMIA: Your high cholesterol is being managed with rosuvastatin . Continue taking rosuvastatin  as prescribed.  -RECURRENT FALLS WITH LOWER EXTREMITY WEAKNESS: Your frequent falls may be due to muscle weakness in your legs. We are referring you to physical therapy for exercises to improve your strength and balance. Try to include standing exercises in addition to your chair exercises.  -CHRONIC PAIN WITH NEUROPATHY: Your chronic pain and nerve pain are being managed with Cymbalta , which you should continue taking  as it is effective.  -ESSENTIAL TREMOR: Your hand tremors are likely due to essential tremor, not Parkinson's disease. We will review your medical records to find potential treatment options for this condition.  INSTRUCTIONS: Please follow up in one month to review your blood sugars and medication adherence. Monitor your blood pressure regularly and continue with your current medications as adjusted. Attend physical therapy for strengthening and balance exercises.

## 2024-09-07 NOTE — Assessment & Plan Note (Signed)
 Hyperlipidemia is managed with rosuvastatin  but she has been out of this for a few months. Continue rosuvastatin .

## 2024-09-07 NOTE — Assessment & Plan Note (Signed)
 She is status post triple coronary artery bypass graft two years ago with no current chest pain or heart failure symptoms. Emphasized controlling blood pressure and diabetes to prevent recurrence.  I have refilled rosuvastatin , but out of this for quite some time.  I recommended she continue aspirin  81 mg daily.  I refilled Hyzaar, switch metoprolol  to carvedilol  for better blood pressure control. Monitor blood pressure regularly.

## 2024-09-07 NOTE — Assessment & Plan Note (Signed)
 Clinically consistent with osteoporosis given fall from standing resulting in a fragility fracture in her right arm.  I offered the patient a DEXA scan today but she declined.  Will check renal function and calcium  levels.  Would like to offer her a bisphosphonate in the future.

## 2024-09-07 NOTE — Assessment & Plan Note (Signed)
 Chronic asymptomatic severe hypertension not well-controlled due to medication access difficulty. Hypertension is managed with Hyzaar. Blood pressure control is crucial to prevent cardiovascular complications. Continue Hyzaar (losartan /hydrochlorothiazide ) combination. Switch metoprolol  to carvedilol  for improved blood pressure management. Monitor blood pressure regularly.  Follow-up in 4 weeks.

## 2024-09-07 NOTE — Progress Notes (Signed)
 New Patient Office Visit  Subjective    Patient ID: Amanda Mendez, female    DOB: 23-Oct-1956  Age: 68 y.o. MRN: [id number]  CC:  Chief Complaint  Patient presents with   Establish Care    Ordered pended for mammogram and lung cancer screening  Eye exam-Has been over a year  Flu-patient states she received this 08/2024  Colonoscopy- patient states she completed this 3 years ago at [facility]  Dexa-declined  AWV-Patient declined     HPI   Discussed the use of AI scribe software for clinical note transcription with the patient, who gave verbal consent to proceed.  History of Present Illness Amanda Mendez is a 68 year old female with diabetes and coronary artery disease who presents for management of her diabetes and cardiovascular health.  She has a history of coronary artery disease and underwent a triple bypass surgery approximately two years ago after catheterization revealed multiple blockages. She is currently on rosuvastatin , losartan , metoprolol , and aspirin  81 mg daily for her heart condition. No significant chest pain has been noted post-surgery.  She has been managing diabetes for about ten years with Lantus  and glimepiride . She adjusts her Lantus  dose based on her blood sugar levels, taking 20 units if her morning sugar is below 120 mg/dL and 40 units if it is above 150-160 mg/dL. She previously used metformin  but discontinued it due to severe diarrhea. Her A1c is currently 10.8%. Financial constraints affect her ability to consistently take her medications, particularly Lantus , which costs over $100 per month.  She has experienced multiple falls over the past year, resulting in a broken arm in four places. She attributes her falls to potential inner ear issues, although no formal diagnosis has been made. She describes the sensation of falling as 'slow motion' and often pitches forward. She reports that her inner ears have drained in the past, but she did not report  current drainage or dizziness.  She reports a history of essential tremor for a couple of years, which affects her hands intermittently, causing difficulty holding objects like a glass of water.  She has not participated in formal physical therapy but performs chair exercises at home.    Outpatient Encounter Medications as of 09/07/2024  Medication Sig   aspirin  EC 81 MG tablet Take 81 mg by mouth daily. Swallow whole.   carvedilol  (COREG ) 6.25 MG tablet Take 1 tablet (6.25 mg total) by mouth 2 (two) times daily with a meal.   DULoxetine  (CYMBALTA ) 30 MG capsule Take 30 mg by mouth daily.   [DISCONTINUED] acetaminophen  (TYLENOL ) 500 MG tablet Take 500 mg by mouth every 4 (four) hours as needed for moderate pain or mild pain.   [DISCONTINUED] ascorbic acid (VITAMIN C) 500 MG tablet Take 500 mg by mouth daily.   [DISCONTINUED] aspirin  EC 325 MG tablet Take 1 tablet (325 mg total) by mouth daily.   [DISCONTINUED] cholecalciferol (VITAMIN D3) 25 MCG (1000 UNIT) tablet Take 1,000 Units by mouth daily.   [DISCONTINUED] glimepiride  (AMARYL ) 4 MG tablet Take 2 mg by mouth 2 (two) times daily. Patient taking once daily   [DISCONTINUED] LANTUS  SOLOSTAR 100 UNIT/ML Solostar Pen Inject 50-60 Units into the skin daily.   [DISCONTINUED] losartan -hydrochlorothiazide  (HYZAAR) 100-25 MG tablet Take 1 tablet by mouth once daily   [DISCONTINUED] metoprolol  (LOPRESSOR ) 50 MG tablet Take 50 mg by mouth 2 (two) times daily.   [DISCONTINUED] omeprazole (PRILOSEC) 20 MG capsule Take 20 mg by mouth daily.   [DISCONTINUED] Polyethylene Glycol  400 (VISINE DRY EYE RELIEF) 1 % SOLN Place 1 drop into both eyes as needed (dry eyes).   [DISCONTINUED] Pyridoxine HCl (VITAMIN B6) 100 MG TABS Take 100 mg by mouth daily.   [DISCONTINUED] rosuvastatin  (CRESTOR ) 40 MG tablet Take 40 mg by mouth every evening.   [DISCONTINUED] sodium chloride  (OCEAN) 0.65 % SOLN nasal spray Place 1 spray into both nostrils as needed for  congestion.   LANTUS  SOLOSTAR 100 UNIT/ML Solostar Pen Inject 40 Units into the skin daily.   losartan -hydrochlorothiazide  (HYZAAR) 100-25 MG tablet Take 1 tablet by mouth daily.   rosuvastatin  (CRESTOR ) 40 MG tablet Take 1 tablet (40 mg total) by mouth every evening.   [DISCONTINUED] cefadroxil  (DURICEF) 500 MG capsule Take 1 capsule (500 mg total) by mouth 2 (two) times daily. (Patient not taking: Reported on 09/07/2024)   [DISCONTINUED] oxyCODONE  (ROXICODONE ) 5 MG immediate release tablet Take 1 tablet (5 mg total) by mouth every 4 (four) hours as needed. (Patient not taking: Reported on 09/07/2024)   No facility-administered encounter medications on file as of 09/07/2024.    Past Medical History:  Diagnosis Date   Anginal pain    Coronary artery disease    angioplasty 9/13   Exertional dyspnea    usually; today it was with nothing (11/03/2012)   GERD (gastroesophageal reflux disease)    History of kidney stones    Hypercholesterolemia with hypertriglyceridemia    Hypertension    Kidney stones    I've had them 5 times; lithotripsy 1st time; flushed out  all the others (11/03/2012)   Obesity (BMI 30-39.9)    Pneumonia 12/10/2010   S/P CABG x 3 10/12/2022   Sinus headache    weekly (11/03/2012)   Type II diabetes mellitus (HCC) 12/10/2005    Past Surgical History:  Procedure Laterality Date   ABDOMINAL AORTOGRAM W/LOWER EXTREMITY Left 09/12/2021   Procedure: ABDOMINAL AORTOGRAM W/LOWER EXTREMITY;  Surgeon: Serene Gaile ORN, MD;  Location: [facility];  Service: Cardiovascular;  Laterality: Left;   ABDOMINAL AORTOGRAM W/LOWER EXTREMITY N/A 10/05/2022   Procedure: ABDOMINAL AORTOGRAM W/LOWER EXTREMITY;  Surgeon: Magda Debby SAILOR, MD;  Location: [facility];  Service: Cardiovascular;  Laterality: N/A;   AMPUTATION TOE Left 10/06/2022   Procedure: AMPUTATION LEFT FIFTH TOE;  Surgeon: Janit Thresa HERO, DPM;  Location: [facility] OR;  Service: Podiatry;  Laterality: Left;    AMPUTATION TOE Left 03/03/2024   Procedure: AMPUTATION, TOE;  Surgeon: Elsa Lonni SAUNDERS, MD;  Location: [facility] OR;  Service: Orthopedics;  Laterality: Left;  LEFT FOURTH TOE AMPUTATION, IRRIGATION AND DRIANAGE ABSCESS   CARDIAC CATHETERIZATION  11/25/29013   CORONARY ANGIOPLASTY WITH STENT PLACEMENT  08/2012   1   CORONARY ARTERY BYPASS GRAFT N/A 10/12/2022   Procedure: CORONARY ARTERY BYPASS GRAFTING (CABG) TIMES THREE USING LEFT INTERNAL MAMMARY ARTERY AND RIGHT LEG GREAT SAPHENOUS VEIN HARVESTED ENDOSCOPICALLY;  Surgeon: Lucas Dorise POUR, MD;  Location: [facility] OR;  Service: Open Heart Surgery;  Laterality: N/A;   HEMATOMA EVACUATION  09/22/2012   Procedure: EVACUATION HEMATOMA;  Surgeon: Arley SAUNDERS Curia, MD;  Location: [facility];  Service: Orthopedics;  Laterality: Right;  Evacuation Hematoma right hand   INCISION AND DRAINAGE OF WOUND Left 03/03/2024   Procedure: IRRIGATION AND DEBRIDEMENT WOUND;  Surgeon: Elsa Lonni SAUNDERS, MD;  Location[facility] OR;  Service: Orthopedics;  Laterality: Left;   LEFT AND RIGHT HEART CATHETERIZATION WITH CORONARY ANGIOGRAM N/A 08/14/2012   Procedure: LEFT AND RIGHT HEART CATHETERIZATION WITH CORONARY ANGIOGRAM;  Surgeon: Lynwood Schilling, MD;  Location: [facility] CATH LAB;  Service: Cardiovascular;  Laterality: N/A;   LEFT HEART CATH AND CORONARY ANGIOGRAPHY N/A 10/03/2022   Procedure: LEFT HEART CATH AND CORONARY ANGIOGRAPHY;  Surgeon: Wendel Lurena POUR, MD;  [facility]: [facility];  Service: Cardiovascular;  Laterality: N/A;   LEFT HEART CATHETERIZATION WITH CORONARY ANGIOGRAM N/A 11/03/2012   Procedure: LEFT HEART CATHETERIZATION WITH CORONARY ANGIOGRAM;  Surgeon: Toribio JONELLE Fuel, MD;  Location: [facility] CATH LAB;  Service: Cardiovascular;  Laterality: N/A;   LITHOTRIPSY  1990's   PERCUTANEOUS CORONARY STENT INTERVENTION (PCI-S)  08/14/2012   Procedure: PERCUTANEOUS CORONARY STENT INTERVENTION (PCI-S);  Surgeon: Lynwood Schilling, MD;  Location: [facility] CATH LAB;  Service:  Cardiovascular;;   SKIN BIOPSY  2012   off my back; it was nothing fatal; infection caused it (11/03/2012)   TEE WITHOUT CARDIOVERSION N/A 10/12/2022   Procedure: TRANSESOPHAGEAL ECHOCARDIOGRAM (TEE);  Surgeon: Lucas Dorise POUR, MD;  Location: [facility] OR;  Service: Open Heart Surgery;  Laterality: N/A;   TUBAL LIGATION  1980    Family History  Problem Relation Age of Onset   Heart failure Mother        Died in her 15s, had angina starting in her 20s   Crohn's disease Brother        Objective    BP (!) 172/78 (BP Location: Left Arm, Cuff Size: Normal)   Pulse 87   Ht 5' 1.5 (1.562 m)   Wt 167 lb (75.8 kg)   BMI 31.04 kg/m   Physical Exam  Gen: High frailty of advanced age Ears: Bilateral tympanic membranes appear normal with no perforations Neck: Normal thyroid , no nodules or adenopathy Heart: Regular, 3 out of 6 early stock murmur best heard at the right upper sternal border Lungs: Unlabored, clear with no crackles Abd: Soft, nontender, no organomegaly Ext: Warm, no edema, normal joints Neuro: Alert, conversational, slow get up and go, needs 1 person assistance to get onto the table.  Wide-based, forward hunching gait using a cane to walk.  Full strength upper and lower extremities. Psych: Appropriate mood and affect, not anxious or depressed appearing     Assessment & Plan:    Problem List Items Addressed This Visit       High   Type 2 diabetes mellitus with stage 3a chronic kidney disease, without long-term current use of insulin  (HCC) - Primary (Chronic)   Diabetes is poorly controlled with an A1c of 10.8%, posing a high risk for complications. Her current regimen includes Lantus  and glimepiride , but adherence is inconsistent due to cost. She is open to medication adjustments and dietary improvements. Contacted the pharmacist to review insurance and explore affordable diabetes medication options. Continue Lantus , adjusting to 40 units daily in the evening.  Will stop  glimepiride  at this point.  I think there are better agents that we can get access to.  Would like to access an SGLT2 inhibitor due to her comorbid CKD.  I think a GLP-1 agonist would also be helpful with her comorbid heart disease.  Instruct her to check blood sugars every morning. Schedule a follow-up in one month to review blood sugars and medication adherence.  Next      Relevant Medications   aspirin  EC 81 MG tablet   losartan -hydrochlorothiazide  (HYZAAR) 100-25 MG tablet   rosuvastatin  (CRESTOR ) 40 MG tablet   LANTUS  SOLOSTAR 100 UNIT/ML Solostar Pen   Other Relevant Orders   POCT glycosylated hemoglobin (Hb A1C) (Completed)   Microalbumin /  creatinine urine ratio   Essential (primary) hypertension (Chronic)   Chronic asymptomatic severe hypertension not well-controlled due to medication access difficulty. Hypertension is managed with Hyzaar. Blood pressure control is crucial to prevent cardiovascular complications. Continue Hyzaar (losartan /hydrochlorothiazide ) combination. Switch metoprolol  to carvedilol  for improved blood pressure management. Monitor blood pressure regularly.  Follow-up in 4 weeks.      Relevant Medications   aspirin  EC 81 MG tablet   losartan -hydrochlorothiazide  (HYZAAR) 100-25 MG tablet   rosuvastatin  (CRESTOR ) 40 MG tablet   carvedilol  (COREG ) 6.25 MG tablet   Ischemic vascular disease (Chronic)   She is status post triple coronary artery bypass graft two years ago with no current chest pain or heart failure symptoms. Emphasized controlling blood pressure and diabetes to prevent recurrence.  I have refilled rosuvastatin , but out of this for quite some time.  I recommended she continue aspirin  81 mg daily.  I refilled Hyzaar, switch metoprolol  to carvedilol  for better blood pressure control. Monitor blood pressure regularly.      Relevant Medications   aspirin  EC 81 MG tablet   losartan -hydrochlorothiazide  (HYZAAR) 100-25 MG tablet   rosuvastatin  (CRESTOR ) 40 MG  tablet   carvedilol  (COREG ) 6.25 MG tablet   Physical deconditioning (Chronic)   Recurrent falls are likely due to lower extremity weakness, with muscle weakness noted in the thighs.  She is diffusely sarcopenia.  At high risk for future falls.  Refered to physical therapy for strengthening and balance exercises. Encouraged standing exercises beyond chair exercises.      Relevant Orders   Ambulatory referral to Physical Therapy   Chronic kidney disease, stage 3a (HCC) (Chronic)   Relevant Orders   Microalbumin / creatinine urine ratio   Basic metabolic panel with GFR   Cystatin C with Glomerular Filtration Rate, Estimated (eGFR)     Medium    HLD (hyperlipidemia) (Chronic)   Hyperlipidemia is managed with rosuvastatin  but she has been out of this for a few months. Continue rosuvastatin .      Relevant Medications   aspirin  EC 81 MG tablet   losartan -hydrochlorothiazide  (HYZAAR) 100-25 MG tablet   rosuvastatin  (CRESTOR ) 40 MG tablet   carvedilol  (COREG ) 6.25 MG tablet   Other Relevant Orders   Lipid panel   Osteoporosis (Chronic)   Clinically consistent with osteoporosis given fall from standing resulting in a fragility fracture in her right arm.  I offered the patient a DEXA scan today but she declined.  Will check renal function and calcium  levels.  Would like to offer her a bisphosphonate in the future.      Relevant Orders   VITAMIN D 25 Hydroxy (Vit-D Deficiency, Fractures)     Low   Essential tremor (Chronic)   Essential tremor affects her hands and occasionally her mouth, with no signs of Parkinson's disease.  Start carvedilol .      Other Visit Diagnoses       Screening mammogram for breast cancer       Relevant Orders   MM 3D DIAGNOSTIC MAMMOGRAM BILATERAL BREAST       Return in about 4 weeks (around 10/05/2024).   Cleatus Debby Specking, MD

## 2024-09-07 NOTE — Telephone Encounter (Signed)
 [facility] calling in a critical lab result.  Caller name: Lorrayne Rushing #: [phone]  Critical Result: Glucose 520 drawn this morning at 9:10am  Provider has been made aware via phone note and direct message (teams/verbal)

## 2024-09-07 NOTE — Assessment & Plan Note (Signed)
 Essential tremor affects her hands and occasionally her mouth, with no signs of Parkinson's disease.  Start carvedilol .

## 2024-09-07 NOTE — Assessment & Plan Note (Signed)
 Recurrent falls are likely due to lower extremity weakness, with muscle weakness noted in the thighs.  She is diffusely sarcopenia.  At high risk for future falls.  Refered to physical therapy for strengthening and balance exercises. Encouraged standing exercises beyond chair exercises.

## 2024-09-08 ENCOUNTER — Ambulatory Visit: Payer: Self-pay | Admitting: Student in an Organized Health Care Education/Training Program

## 2024-09-08 ENCOUNTER — Telehealth: Payer: Self-pay | Admitting: Pharmacist

## 2024-09-08 NOTE — Telephone Encounter (Signed)
 Letter mailed to patient for lab results

## 2024-09-08 NOTE — Telephone Encounter (Signed)
-----   Message from Cleatus Debby Specking sent at 09/07/2024  9:16 AM EDT ----- Hi Amanda Mendez,  I need your help with this patient.  She is new to me.  Has uncontrolled diabetes.  Struggling to afford diabetes medications.  Currently using Lantus , could not tolerate metformin .  Ideally would like to use an SGLT2 inhibitor because she has CKD.  Did well with Jardiance  in the past but now too expensive.  Would also like to know if a GLP-1 agonist could be an option for her?  Thanks in advance for your help.  I anticipate you are going to need at least 2 other agents in addition to the Lantus  to control her diabetes.

## 2024-09-08 NOTE — Progress Notes (Signed)
   09/08/2024 Name: Amanda Mendez MRN: [id number] DOB: 04-23-1956  Chief Complaint  Patient presents with   Medication Problem    cost    Subjective: Received message from provider regarding cost of Lantus  and cost of other diabetes therapies like SGLT2s and GLP1s.    Medication Access/Adherence  Current Pharmacy:  [facility] [address], [place] - [address] [place] [address] Phone: [phone] Fax: [phone]  [id number] - [place], [place] - [address] [place] [address] Phone: [phone] Fax: [phone]  [name] [address] [place] [address] Phone: [phone] Fax: [phone]     Objective:  Lab Results  Component Value Date   HGBA1C 10.8 (A) 09/07/2024    Lab Results  Component Value Date   CREATININE 1.65 (H) 09/07/2024   BUN 24 (H) 09/07/2024   NA 132 (L) 09/07/2024   K 4.0 09/07/2024   CL 92 (L) 09/07/2024   CO2 27 09/07/2024    Lab Results  Component Value Date   CHOL 265 (H) 09/07/2024   HDL 53.50 09/07/2024   LDLCALC 133 (H) 09/07/2024   TRIG 395.0 (H) 09/07/2024   CHOLHDL 5 09/07/2024      Assessment/Plan:   Medication Management / Access:  Here is what I found about her [employer] Medicare plan:  She has $255 deductible to meet, then all preferred brand medications would be $47  /month. So first fill could be $302 but any after would be $47 /month. Ozempic, Mounjaro, Trulicity, Jardiance  and Farxiga are all preferred brands.   Regarding her Lantus  - it is $35 / 30 days supply. In the past her Rx was for 60 mL which was a 75 day supply so she was charged $35 copay x 3 or $105  Her most recent Rx was for 15mL or 38 days supply and her copay was $70  I can reach out to there regarding above cost.   If she cannot afford the $255 deductible + $47 copay. There is a coupon for Farxiga 30 days she could use once  and apply for patient assistance program. The patient assistance program for GLP1s will likely only be temporary until the end of the year given the changes in their patient assistance programs for 2026.   She might be a good candidate for Continuous Glucose Monitor - I saw that she is adjusting her Lantus  based on her blood glucose. It could be a good way to show her that taking the same dose every day would give her more stable blood glucose control.  Forwarded above notes to Dr Jerrell.   Tried to call patient to discuss above - has to leave message on VM  Madelin Ray, PharmD Clinical Pharmacist [facility] Primary [facility]  Population Health [phone]  09/15/2024 Addendum Second outreach was unsuccessful. Left a message on VM with my CB# [phone]  Madelin Ray, PharmD Clinical Pharmacist [facility] Primary Care  Population Health [phone]

## 2024-09-10 LAB — CYSTATIN C WITH GLOMERULAR FILTRATION RATE, ESTIMATED (EGFR)
CYSTATIN C: 1.92 mg/L — ABNORMAL HIGH (ref 0.52–1.14)
eGFR: 29 mL/min/1.73m2 — ABNORMAL LOW (ref >=60)

## 2024-09-16 NOTE — Telephone Encounter (Signed)
 Copied from CRM [id number]. Topic: Clinical - Lab/Test Results >> Sep 16, 2024 11:30 AM Carlyon D wrote: Reason for CRM: Pt calling in regards to her lab work. Pt would like a call from some one to discuss

## 2024-09-16 NOTE — Telephone Encounter (Signed)
 Returned call to discuss labs, Left VM to return call

## 2024-10-05 ENCOUNTER — Encounter: Payer: Self-pay | Admitting: Student in an Organized Health Care Education/Training Program

## 2024-10-05 ENCOUNTER — Ambulatory Visit: Admitting: Student in an Organized Health Care Education/Training Program

## 2024-10-05 VITALS — BP 174/70 | HR 77 | Wt 171.0 lb

## 2024-10-05 DIAGNOSIS — Z794 Long term (current) use of insulin: Secondary | ICD-10-CM

## 2024-10-05 DIAGNOSIS — E1122 Type 2 diabetes mellitus with diabetic chronic kidney disease: Secondary | ICD-10-CM

## 2024-10-05 DIAGNOSIS — I10 Essential (primary) hypertension: Secondary | ICD-10-CM

## 2024-10-05 DIAGNOSIS — N184 Chronic kidney disease, stage 4 (severe): Secondary | ICD-10-CM | POA: Diagnosis not present

## 2024-10-05 MED ORDER — LOSARTAN POTASSIUM-HCTZ 100-25 MG PO TABS: 1.0000 | ORAL_TABLET | Freq: Every day | ORAL | 3 refills | Status: AC

## 2024-10-05 NOTE — Progress Notes (Signed)
 Established Patient Office Visit  Patient ID: Amanda Mendez, female    DOB: [birth year]  Age: 68 y.o. MRN: [id number] PCP: Jerrell Cleatus Ned, MD  Chief Complaint  Patient presents with   Diabetes    4 week follow up      Subjective:     HPI  Discussed the use of AI scribe software for clinical note transcription with the patient, who gave verbal consent to proceed.  History of Present Illness Amanda Mendez is a 68 year old female with diabetes and kidney failure who presents for management of her blood sugar levels and medication review.  She has experienced two episodes of blood sugar spikes reaching 300 mg/dL over the past month, which she attributes to checking her sugar too soon after eating. Typically, her postprandial blood sugar levels range between 180 mg/dL and 799 mg/dL, while fasting levels are sometimes as low as 85 mg/dL. Her blood sugar was 120 mg/dL this morning. She did not bring her glucometer to the visit but plans to do so next time.  She is currently using Lantus  insulin  at a dose of 40 units daily, which she finds expensive but manageable with assistance from her son. She has previously used Jardiance , which effectively lowered her blood sugar levels, but she is concerned about the cost of restarting it due to her Medicare plan's deductible.  She has been told she has kidney failure.  She is taking Hyzaar (losartan  with a diuretic) for blood pressure management but ran out of it about ten days ago. She has a history of open-heart surgery with two bypasses and has not filled her cholesterol medication recently.  No recent falls, but she experiences occasional balance issues. She uses aspirin  regularly and has a pill box to organize her medications, although she sometimes forgets to take them.     Objective:     BP (!) 174/70   Pulse 77   Wt 171 lb (77.6 kg)   SpO2 96%   BMI 31.79 kg/m   Physical Exam  Gen: Frail appearing older woman Neck:  Normal thyroid , no nodules or adenopathy Heart: Regular, 2 out of 6 early stock murmur best heard at the right upper sternal border Lungs: Unlabored, clear throughout Ext: Cool to the touch, no edema     Assessment & Plan:   Problem List Items Addressed This Visit       High   Type 2 diabetes mellitus with diabetic chronic kidney disease (HCC) - Primary (Chronic)   Chronic and uncontrolled.  Diabetes is complicated by progressive kidney disease with proteinuria.  She also has ischemic vascular disease.  She has difficulty accessing medications due to cost, negative social determinants of health.  She is currently using insulin  products from family members, about 40 units/day long-acting.  I recommended that she work with our clinical pharmacist to apply for patient assistance to obtain a long-acting insulin  product as well as an SGLT2 inhibitor.  I think a GLP-1 agonist in the future would also be beneficial given her kidney disease.  Follow-up with me in 1 month, hopefully we have made some progress on access to medications by then.      Relevant Medications   losartan -hydrochlorothiazide  (HYZAAR) 100-25 MG tablet   Essential (primary) hypertension (Chronic)   Chronic and uncontrolled.  She has had inconsistent with see with taking medications.  Difficulty with affording medications.  I sent a refill of the Hyzaar full dose to her pharmacy.  We talked about importance of  medication adherence and consistency.  Follow-up in 1 month for blood pressure recheck.      Relevant Medications   losartan -hydrochlorothiazide  (HYZAAR) 100-25 MG tablet   CKD stage 4 due to type 2 diabetes mellitus (HCC) (Chronic)   Chronic, worsening.  Last estimated GFR was 29.  Consistent with stage IV CKD.  She has proteinuria with over 2 g of microalbumin.  I strongly recommended that we get control of the blood pressure and diabetes to lower the risk of progression in the future.  I recommended that she restart and  be consistent with using losartan .  I recommended that she work with our clinical pharmacist to apply for an assistance program to access an SGLT2 inhibitor.  Despite all this she is at risk for progression of her kidney disease.  If she remains in stage IV range in the future, we will refer to nephrology for consultation.      Relevant Medications   losartan -hydrochlorothiazide  (HYZAAR) 100-25 MG tablet    Return in about 4 weeks (around 11/02/2024).    Cleatus Debby Specking, MD [facility]

## 2024-10-05 NOTE — Assessment & Plan Note (Signed)
 Chronic, worsening.  Last estimated GFR was 29.  Consistent with stage IV CKD.  She has proteinuria with over 2 g of microalbumin.  I strongly recommended that we get control of the blood pressure and diabetes to lower the risk of progression in the future.  I recommended that she restart and be consistent with using losartan .  I recommended that she work with our clinical pharmacist to apply for an assistance program to access an SGLT2 inhibitor.  Despite all this she is at risk for progression of her kidney disease.  If she remains in stage IV range in the future, we will refer to nephrology for consultation.

## 2024-10-05 NOTE — Patient Instructions (Signed)
  VISIT SUMMARY: Today, we reviewed your blood sugar levels and medications. We discussed your recent blood sugar spikes, kidney function, blood pressure management, and cholesterol medication adherence.  YOUR PLAN: -TYPE 2 DIABETES MELLITUS WITH STAGE 3A CHRONIC KIDNEY DISEASE: Type 2 diabetes is a condition where your body does not use insulin  properly, leading to high blood sugar levels. Your kidney function has declined to 50%. Continue taking Lantus  at 40 units daily. We discussed the possibility of using Jardiance  again and suggested talking to a pharmacist about assistance programs. It's important to control your blood sugar to prevent further kidney damage.  -ESSENTIAL HYPERTENSION: Hypertension is high blood pressure, which can damage your kidneys if not controlled. You need to take Hyzaar consistently every day. A 90-day refill has been sent to your pharmacy.  -ISCHEMIC HEART DISEASE, STATUS POST CORONARY ARTERY BYPASS GRAFT: Ischemic heart disease is a condition where your heart's blood supply is reduced, often due to blockages. You have had bypass surgery in the past. It's important to manage your cholesterol to prevent further heart issues. Please take your prescribed rosuvastatin .  -HYPERLIPIDEMIA: Hyperlipidemia means you have high cholesterol levels, which can lead to heart disease. It's important to take your rosuvastatin  as prescribed to manage your cholesterol levels.  INSTRUCTIONS: Please follow up with a pharmacist about assistance programs for Jardiance . Continue taking Lantus  at 40 units daily. Take Hyzaar every day as prescribed, and a 90-day refill has been sent to your pharmacy. Make sure to take your rosuvastatin  to manage your cholesterol levels.

## 2024-10-05 NOTE — Assessment & Plan Note (Signed)
 Chronic and uncontrolled.  Diabetes is complicated by progressive kidney disease with proteinuria.  She also has ischemic vascular disease.  She has difficulty accessing medications due to cost, negative social determinants of health.  She is currently using insulin  products from family members, about 40 units/day long-acting.  I recommended that she work with our clinical pharmacist to apply for patient assistance to obtain a long-acting insulin  product as well as an SGLT2 inhibitor.  I think a GLP-1 agonist in the future would also be beneficial given her kidney disease.  Follow-up with me in 1 month, hopefully we have made some progress on access to medications by then.

## 2024-10-05 NOTE — Assessment & Plan Note (Signed)
 Chronic and uncontrolled.  She has had inconsistent with see with taking medications.  Difficulty with affording medications.  I sent a refill of the Hyzaar full dose to her pharmacy.  We talked about importance of medication adherence and consistency.  Follow-up in 1 month for blood pressure recheck.

## 2024-10-08 ENCOUNTER — Telehealth: Payer: Self-pay | Admitting: Pharmacist

## 2024-10-08 NOTE — Telephone Encounter (Signed)
 See Message below from Dr Jerrell. I tried to call patient's daughter but had to LM. CB# [phone]   Message Jerrell Cleatus Ned, MD  Carla Milling, RPH-CPP Hi Terrah Decoster,  I saw this patient today, you did some research for her med assistance for diabetic medications, but weren't able to get in touch with her. She is currently using her daughter's lantus , trouble affording her own. She wants to use Jardiance  in the future, can't afford the deductible. Can you try to talk with her about an assistance program? Her phone is currenlty turned off, can't afford the bill. You can call the alternate contact in the chart, Elenor Romano, and she will help you talk with the patient. Thank you in advance.  Cleatus

## 2024-10-12 ENCOUNTER — Ambulatory Visit

## 2024-10-15 NOTE — Telephone Encounter (Signed)
 Mrs. Smoots returned my call but I was not available. She left a message with her number of [phone]. I tried to call but no answer and unable to [facility] message because MB was full.  I reached about to Orlando Thalmann again and left a message on VM with CB# [phone]

## 2024-10-20 NOTE — Telephone Encounter (Signed)
 Tried to call patient again to discuss Continuous Glucose Monitor and medication access / patient assistance program. Unable to reach patient and unable to LM on VM - MB full.

## 2024-10-23 ENCOUNTER — Telehealth: Payer: Self-pay | Admitting: Student in an Organized Health Care Education/Training Program

## 2024-10-23 NOTE — Telephone Encounter (Signed)
 Copied from CRM #[id number]. Topic: General - Other >> Oct 23, 2024 10:40 AM Carlyon D wrote: Reason for CRM: Pt calling back in regards to a missed call from miss Tammy Pharmacist. Pt would like a call back as they have been playing some phone tag.

## 2024-10-23 NOTE — Telephone Encounter (Signed)
 Patient called yesterday 11/13 - LM on VM of Clinical Pharmacist. I tried to call back today but again was unable to reach patient and unable to leave a message.

## 2024-10-29 ENCOUNTER — Other Ambulatory Visit: Payer: Self-pay | Admitting: Pharmacist

## 2024-10-29 DIAGNOSIS — E1122 Type 2 diabetes mellitus with diabetic chronic kidney disease: Secondary | ICD-10-CM

## 2024-10-29 NOTE — Telephone Encounter (Signed)
 See note from telephone visit 10/29/2024

## 2024-10-29 NOTE — Progress Notes (Signed)
 10/29/2024 Name: Amanda Mendez MRN: [id number] DOB: 07/18/1956  Chief Complaint  Patient presents with   Medication Management    Subjective: Received message from provider regarding cost of Lantus  and cost of other diabetes therapies like SGLT2s and GLP1s.  Spoke with patient today regarding medication costs. She would like to take Jardiance  or a similar medications because her son has been taking Scarlett has had good response with lowering blood glucose.    Diabetes:  Current medications: Lantus  40 units daily  Medications tried in the past: Invokana - stopped due to UTI.    Macrovascular and Microvascular Risk Reduction:  Statin? yes (rosuvastatin  ); ACEi/ARB? yes (losartan  HCT) Last urinary albumin /creatinine ratio:  Lab Results  Component Value Date   MICRALBCREAT 2,011.4 (H) 09/07/2024   Last eye exam:   Last foot exam: No foot exam found Tobacco Use:  Tobacco Use: Medium Risk (10/05/2024)   Patient History    Smoking Tobacco Use: Former    Smokeless Tobacco Use: Never    Passive Exposure: Not on file      Medication Access/Adherence  Current Pharmacy:  [facility] [place], [place] - [address] [place] [address] Phone: [phone] Fax: [phone]  [id number] [place], [place] - [address] [place] [address] Phone: [phone] Fax: [phone]  [name] [address] [place] [address] Phone: [phone] Fax: [phone]     Objective:  Lab Results  Component Value Date   HGBA1C 10.8 (A) 09/07/2024    Lab Results  Component Value Date   CREATININE 1.65 (H) 09/07/2024   BUN 24 (H) 09/07/2024   NA 132 (L) 09/07/2024   K 4.0 09/07/2024   CL 92 (L) 09/07/2024   CO2 27 09/07/2024    Lab Results  Component Value Date   CHOL 265 (H) 09/07/2024   HDL 53.50 09/07/2024   LDLCALC 133 (H) 09/07/2024   TRIG 395.0 (H)  09/07/2024   CHOLHDL 5 09/07/2024      Assessment/Plan:   Medication Management / Access:  Current [employer] Medicare plan:  She has $255 deductible to meet, then all preferred brand medications would be $47  /month. So first fill could be $302 but any after would be $47 /month. Ozempic, Mounjaro, Trulicity, Jardiance  and Farxiga are all preferred brands.  Lantus  is $35 / 30 days supply. In the past her Rx was for 60 mL which was a 75 day supply so she was charged $35 copay x 3 or $105  Her most recent Rx was for 15mL or 38 days supply and her copay was $70.  For 2026 her tier 3 copayment and deductible is increasing. Tier 2 copay will be 20% of medication cost and deductible would be $355. Farxiga / dapagliflozin - cost before deductible is [phone] / cost after deductible $41 Jardiance  - cost before deductible is $490 / cost after deductible $135 Lantus  - cost before deductible and after deductible is the same - $35 / 30 day supply or $70 for 31 to 60 day supply or $105 for 61 + day supply.   She should qualify for patient assistance program for either Farxiga or Jardiance . I would start with Farxiga since if she was not approved it would be lower cost on her 2026 plan.  Patient will be in the office to see Dr Jerrell on Tuesday 11/25. I plan to meet with her and start  application for Farxiga.     Madelin Ray, PharmD Clinical Pharmacist [facility]  Population Health [phone]

## 2024-11-03 ENCOUNTER — Encounter: Payer: Self-pay | Admitting: Student in an Organized Health Care Education/Training Program

## 2024-11-03 ENCOUNTER — Ambulatory Visit: Admitting: Student in an Organized Health Care Education/Training Program

## 2024-11-03 ENCOUNTER — Other Ambulatory Visit: Payer: Self-pay | Admitting: Pharmacist

## 2024-11-03 VITALS — BP 154/72 | HR 67 | Wt 173.0 lb

## 2024-11-03 DIAGNOSIS — E7800 Pure hypercholesterolemia, unspecified: Secondary | ICD-10-CM

## 2024-11-03 DIAGNOSIS — E1122 Type 2 diabetes mellitus with diabetic chronic kidney disease: Secondary | ICD-10-CM | POA: Diagnosis not present

## 2024-11-03 DIAGNOSIS — I152 Hypertension secondary to endocrine disorders: Secondary | ICD-10-CM

## 2024-11-03 DIAGNOSIS — E1159 Type 2 diabetes mellitus with other circulatory complications: Secondary | ICD-10-CM | POA: Diagnosis not present

## 2024-11-03 DIAGNOSIS — R1011 Right upper quadrant pain: Secondary | ICD-10-CM | POA: Diagnosis not present

## 2024-11-03 DIAGNOSIS — Z794 Long term (current) use of insulin: Secondary | ICD-10-CM

## 2024-11-03 DIAGNOSIS — N184 Chronic kidney disease, stage 4 (severe): Secondary | ICD-10-CM

## 2024-11-03 MED ORDER — AMLODIPINE BESYLATE 5 MG PO TABS: 5.0000 mg | ORAL_TABLET | Freq: Every day | ORAL | 3 refills | Status: AC

## 2024-11-03 NOTE — Assessment & Plan Note (Signed)
 Intermittent right-sided abdominal pain may be related to gallbladder issues or constipation. The differential includes gallbladder stones, constipation, IBS, or Crohn's disease, with a family history of Crohn's disease noted. Ordered an ultrasound of the gallbladder to check for stones and blood work to evaluate abdominal pain. Recommended Miralax  or Senna for constipation management.

## 2024-11-03 NOTE — Progress Notes (Signed)
 Established Patient Office Visit  Patient ID: Amanda Mendez, female    DOB: [birth year]  Age: 68 y.o. MRN: [id number] PCP: Jerrell Cleatus Ned, MD  Chief Complaint  Patient presents with   Diabetes    4 week follow up I have requested again for eye exam notes     Subjective:     HPI  Discussed the use of AI scribe software for clinical note transcription with the patient, who gave verbal consent to proceed.  History of Present Illness Amanda Mendez is a 68 year old female with diabetes and hypertension who presents with abdominal pain.  She has been experiencing a dull, constant abdominal ache for over a week, primarily located on the right side with occasional radiation. The pain sometimes worsens after meals but does not prevent her from eating. No vomiting is reported. She has not had her gallbladder removed and is concerned about her appendix. Occasional constipation is noted, which she believes may contribute to the pain.  She has a history of diabetes and struggles with dietary adherence, particularly craving sweets. She uses a pill box for her medications but sometimes forgets to take her insulin  shot, Lantus , which she administers at 40 units in the evening. Blood sugar readings have been variable, with recent values including 240, 285, 263, 202, 299, and 183.  Her hypertension is managed with Hyzaar. She is trying to reduce her salt intake and is taking her blood pressure medication as prescribed.  She has a history of high Tylenol  use, previously taking two bottles a week, but has since decreased her intake. She is concerned about the impact of Tylenol  on her kidneys.  Her family history includes a brother who died from Crohn's disease, which she is concerned about due to the hereditary nature of the condition. She has not been tested for Crohn's disease herself.    Objective:     BP (!) 154/72   Pulse 67   Wt 173 lb (78.5 kg)   BMI 32.16 kg/m    Physical  Exam  Gen: Chronically frail appearing woman Heart: Regular, no murmur Lungs: Unlabored, clear throughout Ext: Warm, no edema    Assessment & Plan:   Problem List Items Addressed This Visit       High   Type 2 diabetes mellitus with diabetic chronic kidney disease (HCC) - Primary (Chronic)   Diabetes management is suboptimal due to inconsistent medication adherence and dietary control, with limited medication options because of CKD stage 4. Metformin  is contraindicated. Emphasized the importance of dietary control and medication adherence to protect kidney function. Continue Lantus  insulin  40 units daily and discontinue glimepiride .  We completed assistance application for Farxiga 5 mg once daily.  We talked about potentially needing to use short acting mealtime insulin  in the future if we cannot get better control of the blood sugars.      Hypertension associated with diabetes (HCC) (Chronic)   Blood pressure has improved with Hyzaar, currently at 154/72 mmHg. The goal is to reduce systolic pressure to under 140 mmHg. Discussed the low risk of hypotension with the addition of amlodipine . Continue Hyzaar and consider adding amlodipine  for further blood pressure control. Monitor for lightheadedness and discontinue amlodipine  if it occurs.      Relevant Medications   amLODipine  (NORVASC ) 5 MG tablet     Medium    HLD (hyperlipidemia) (Chronic)   Relevant Medications   amLODipine  (NORVASC ) 5 MG tablet   Other Relevant Orders   Lipid panel  Unprioritized   RUQ abdominal pain   Intermittent right-sided abdominal pain may be related to gallbladder issues or constipation. The differential includes gallbladder stones, constipation, IBS, or Crohn's disease, with a family history of Crohn's disease noted. Ordered an ultrasound of the gallbladder to check for stones and blood work to evaluate abdominal pain. Recommended Miralax  or Senna for constipation management.      Relevant Orders    US  Abdomen Limited RUQ (LIVER/GB)   CBC with Differential/Platelet   Comprehensive metabolic panel with GFR    Return in about 5 weeks (around 12/08/2024).    Cleatus Debby Specking, MD [facility]

## 2024-11-03 NOTE — Patient Instructions (Signed)
  VISIT SUMMARY: Today, we discussed your ongoing abdominal pain, diabetes management, hypertension, and constipation. We reviewed your current medications and made some adjustments to better manage your conditions. We also ordered some tests to further investigate the cause of your abdominal pain.  YOUR PLAN: -TYPE 2 DIABETES MELLITUS WITH STAGE 4 DIABETIC CHRONIC KIDNEY DISEASE: Your diabetes management needs improvement due to inconsistent medication use and dietary habits. We emphasized the importance of sticking to your diet and taking your medications as prescribed to protect your kidney function. Continue taking Lantus  insulin  at 40 units daily and stop taking glimepiride . We will look into other medication options like Jardiance , Trulicity, or Ozempic. If these are not available, we may consider short-acting insulin .  -ESSENTIAL HYPERTENSION: Your blood pressure has improved with your current medication, Hyzaar, but we aim to lower it further. Continue taking Hyzaar and we may add amlodipine  to help reduce your blood pressure more. Watch for any lightheadedness and stop taking amlodipine  if it occurs.  -RIGHT-SIDED ABDOMINAL PAIN, UNDER EVALUATION: Your abdominal pain could be due to several reasons, including gallbladder issues or constipation. We have ordered an ultrasound of your gallbladder and some blood tests to find the cause. In the meantime, you can take Miralax  or Senna to help with constipation.  -CONSTIPATION: Constipation may be contributing to your abdominal pain. It's important to have regular bowel movements. We recommend taking Miralax  or Senna to help you have at least one soft bowel movement per day.  INSTRUCTIONS: Please follow up with the ultrasound and blood tests as ordered. Continue taking your medications as prescribed and monitor for any side effects. If you experience any new symptoms or if your abdominal pain worsens, contact our office immediately.

## 2024-11-03 NOTE — Assessment & Plan Note (Signed)
 Blood pressure has improved with Hyzaar, currently at 154/72 mmHg. The goal is to reduce systolic pressure to under 140 mmHg. Discussed the low risk of hypotension with the addition of amlodipine . Continue Hyzaar and consider adding amlodipine  for further blood pressure control. Monitor for lightheadedness and discontinue amlodipine  if it occurs.

## 2024-11-03 NOTE — Assessment & Plan Note (Signed)
 Diabetes management is suboptimal due to inconsistent medication adherence and dietary control, with limited medication options because of CKD stage 4. Metformin  is contraindicated. Emphasized the importance of dietary control and medication adherence to protect kidney function. Continue Lantus  insulin  40 units daily and discontinue glimepiride .  We completed assistance application for Farxiga 5 mg once daily.  We talked about potentially needing to use short acting mealtime insulin  in the future if we cannot get better control of the blood sugars.

## 2024-11-03 NOTE — Progress Notes (Signed)
   11/03/2024 Name: Amanda Mendez MRN: [id number] DOB: 01-22-1956  Chief Complaint  Patient presents with   Diabetes   Medication Management    Amanda Mendez is a 69 y.o. year old female who was referred for medication management by their primary care provider, Jerrell Cleatus Ned, MD. They presented for a face to face visit today - patient was seen after visit with PCP today.    They were referred to the pharmacist by their PCP for assistance in managing diabetes and medication access    Subjective:  Care Team: Primary Care Provider: Jerrell Cleatus Ned, MD ; Next Scheduled Visit: 12/08/2024    Diabetes:  Current medications: Lantus  40 units daily  Medications tried in the past: Invokana - stopped due to UTI; Farxiga - itching   Macrovascular and Microvascular Risk Reduction:  Statin? yes (rosuvastatin  ); ACEi/ARB? yes (losartan  HCT) Last urinary albumin /creatinine ratio:  Lab Results  Component Value Date   MICRALBCREAT 2,011.4 (H) 09/07/2024   Last eye exam:   Last foot exam: No foot exam found Tobacco Use:  Tobacco Use: Medium Risk (11/03/2024)   Patient History    Smoking Tobacco Use: Former    Smokeless Tobacco Use: Never    Passive Exposure: Not on file      Medication Access/Adherence  Current Pharmacy:  [facility] [place], [place] - [address] [place] [address] Phone: [phone] Fax: [phone]  [id number] [place], [place] - [address] [place] [address] Phone: [phone] Fax: [phone]  [name] [address] [place] [address] Phone: [phone] Fax: [phone]     Objective:  Lab Results  Component Value Date   HGBA1C 10.8 (A) 09/07/2024    Lab Results  Component Value Date   CREATININE 1.65 (H) 09/07/2024   BUN 24 (H) 09/07/2024   NA 132 (L) 09/07/2024   K 4.0 09/07/2024   CL 92 (L)  09/07/2024   CO2 27 09/07/2024    Lab Results  Component Value Date   CHOL 265 (H) 09/07/2024   HDL 53.50 09/07/2024   LDLCALC 133 (H) 09/07/2024   TRIG 395.0 (H) 09/07/2024   CHOLHDL 5 09/07/2024      Assessment/Plan:   Medication Management / Access:  Current [employer] Medicare plan:  She has $255 deductible to meet, then all preferred brand medications would be $47  /month. So first fill could be $302 but any after would be $47 /month. Ozempic, Mounjaro, Trulicity, Jardiance  are all preferred brands.  Lantus  is $35 / 30 days supply. In the past her Rx was for 60 mL which was a 75 day supply so she was charged $35 copay x 3 or $105  Her most recent Rx was for 15mL or 38 days supply and her copay was $70.  For 2026 her tier 3 copayment and deductible is increasing. Tier 2 copay will be 20% of medication cost and deductible would be $355. Jardiance  - cost before deductible is $490 / cost after deductible $135 Lantus  - cost before deductible and after deductible is the same - $35 / 30 day supply or $70 for 31 to 60 day supply or $105 for 61 + day supply.   She should qualify for patient assistance program for Jardiance . Starting application today.    Madelin Ray, PharmD Clinical Pharmacist [facility]  Population Health [phone]

## 2024-11-04 ENCOUNTER — Ambulatory Visit: Payer: Self-pay | Admitting: Student in an Organized Health Care Education/Training Program

## 2024-11-04 LAB — COMPREHENSIVE METABOLIC PANEL WITH GFR
AG Ratio: 1.5 (calc) (ref 1.0–2.5)
ALT: 12 U/L (ref 6–29)
AST: 16 U/L (ref 10–35)
Albumin: 4 g/dL (ref 3.6–5.1)
Alkaline phosphatase (APISO): 98 U/L (ref 37–153)
BUN/Creatinine Ratio: 16 (calc) (ref 6–22)
BUN: 32 mg/dL — ABNORMAL HIGH (ref 7–25)
CO2: 25 mmol/L (ref 20–32)
Calcium: 9.3 mg/dL (ref 8.6–10.4)
Chloride: 97 mmol/L — ABNORMAL LOW (ref 98–110)
Creat: 2 mg/dL — ABNORMAL HIGH (ref 0.50–1.05)
Globulin: 2.7 g/dL (ref 1.9–3.7)
Glucose, Bld: 328 mg/dL — ABNORMAL HIGH (ref 65–99)
Potassium: 3.8 mmol/L (ref 3.5–5.3)
Sodium: 138 mmol/L (ref 135–146)
Total Bilirubin: 0.4 mg/dL (ref 0.2–1.2)
Total Protein: 6.7 g/dL (ref 6.1–8.1)
eGFR: 27 mL/min/1.73m2 — ABNORMAL LOW (ref >=60)

## 2024-11-04 LAB — CBC WITH DIFFERENTIAL/PLATELET
Absolute Lymphocytes: 2081 {cells}/uL (ref 850–3900)
Absolute Monocytes: 793 {cells}/uL (ref 200–950)
Basophils Absolute: 103 {cells}/uL (ref 0–200)
Basophils Relative: 1 %
Eosinophils Absolute: 536 {cells}/uL — ABNORMAL HIGH (ref 15–500)
Eosinophils Relative: 5.2 %
HCT: 42.7 % (ref 35.9–46.0)
Hemoglobin: 14.2 g/dL (ref 11.7–15.5)
MCH: 31.1 pg (ref 27.0–33.0)
MCHC: 33.3 g/dL (ref 31.6–35.4)
MCV: 93.6 fL (ref 81.4–101.7)
MPV: 11.4 fL (ref 7.5–12.5)
Monocytes Relative: 7.7 %
Neutro Abs: 6788 {cells}/uL (ref 1500–7800)
Neutrophils Relative %: 65.9 %
Platelets: 328 Thousand/uL (ref 140–400)
RBC: 4.56 Million/uL (ref 3.80–5.10)
RDW: 12.1 % (ref 11.0–15.0)
Total Lymphocyte: 20.2 %
WBC: 10.3 Thousand/uL (ref 3.8–10.8)

## 2024-11-04 LAB — LIPID PANEL
Cholesterol: 181 mg/dL (ref <200)
HDL: 53 mg/dL (ref >=50)
LDL Cholesterol (Calc): 96 mg/dL
Non-HDL Cholesterol (Calc): 128 mg/dL (ref <130)
Total CHOL/HDL Ratio: 3.4 (calc) (ref <5.0)
Triglycerides: 231 mg/dL — ABNORMAL HIGH (ref <150)

## 2024-11-21 ENCOUNTER — Encounter (HOSPITAL_COMMUNITY): Payer: Self-pay | Admitting: *Deleted

## 2024-11-21 ENCOUNTER — Emergency Department (HOSPITAL_COMMUNITY)

## 2024-11-21 ENCOUNTER — Emergency Department (HOSPITAL_COMMUNITY)
Admission: EM | Admit: 2024-11-21 | Discharge: 2024-11-22 | Disposition: A | Discharge status: Home / Self Care | Attending: Emergency Medicine | Admitting: Emergency Medicine

## 2024-11-21 ENCOUNTER — Other Ambulatory Visit: Payer: Self-pay

## 2024-11-21 DIAGNOSIS — R739 Hyperglycemia, unspecified: Secondary | ICD-10-CM | POA: Diagnosis present

## 2024-11-21 DIAGNOSIS — N289 Disorder of kidney and ureter, unspecified: Secondary | ICD-10-CM

## 2024-11-21 DIAGNOSIS — E1165 Type 2 diabetes mellitus with hyperglycemia: Secondary | ICD-10-CM | POA: Insufficient documentation

## 2024-11-21 DIAGNOSIS — I10 Essential (primary) hypertension: Secondary | ICD-10-CM | POA: Insufficient documentation

## 2024-11-21 DIAGNOSIS — Z7982 Long term (current) use of aspirin: Secondary | ICD-10-CM | POA: Diagnosis not present

## 2024-11-21 LAB — CBC
HCT: 42.2 % (ref 36.0–46.0)
Hemoglobin: 14.5 g/dL (ref 12.0–15.0)
MCH: 31.1 pg (ref 26.0–34.0)
MCHC: 34.4 g/dL (ref 30.0–36.0)
MCV: 90.6 fL (ref 80.0–100.0)
Platelets: 346 K/uL (ref 150–400)
RBC: 4.66 MIL/uL (ref 3.87–5.11)
RDW: 12.8 % (ref 11.5–15.5)
WBC: 11.7 K/uL — ABNORMAL HIGH (ref 4.0–10.5)
nRBC: 0 % (ref 0.0–0.2)

## 2024-11-21 LAB — BASIC METABOLIC PANEL WITH GFR
Anion gap: 12 (ref 5–15)
BUN: 37 mg/dL — ABNORMAL HIGH (ref 8–23)
CO2: 31 mmol/L (ref 22–32)
Calcium: 10 mg/dL (ref 8.9–10.3)
Chloride: 90 mmol/L — ABNORMAL LOW (ref 98–111)
Creatinine, Ser: 1.81 mg/dL — ABNORMAL HIGH (ref 0.44–1.00)
GFR, Estimated: 30 mL/min — ABNORMAL LOW (ref >60)
Glucose, Bld: 569 mg/dL (ref 70–99)
Potassium: 4.2 mmol/L (ref 3.5–5.1)
Sodium: 133 mmol/L — ABNORMAL LOW (ref 135–145)

## 2024-11-21 LAB — I-STAT CHEM 8, ED
BUN: 46 mg/dL — ABNORMAL HIGH (ref 8–23)
Calcium, Ion: 1.07 mmol/L — ABNORMAL LOW (ref 1.15–1.40)
Chloride: 93 mmol/L — ABNORMAL LOW (ref 98–111)
Creatinine, Ser: 1.8 mg/dL — ABNORMAL HIGH (ref 0.44–1.00)
Glucose, Bld: 573 mg/dL (ref 70–99)
HCT: 45 % (ref 36.0–46.0)
Hemoglobin: 15.3 g/dL — ABNORMAL HIGH (ref 12.0–15.0)
Potassium: 4.2 mmol/L (ref 3.5–5.1)
Sodium: 132 mmol/L — ABNORMAL LOW (ref 135–145)
TCO2: 30 mmol/L (ref 22–32)

## 2024-11-21 LAB — URINALYSIS, ROUTINE W REFLEX MICROSCOPIC
Bacteria, UA: NONE SEEN
Bilirubin Urine: NEGATIVE
Glucose, UA: >=500 mg/dL — AB
Ketones, ur: NEGATIVE mg/dL
Leukocytes,Ua: NEGATIVE
Nitrite: NEGATIVE
Protein, ur: 30 mg/dL — AB
Specific Gravity, Urine: 1.016 (ref 1.005–1.030)
pH: 7 (ref 5.0–8.0)

## 2024-11-21 LAB — BLOOD GAS, VENOUS
Acid-Base Excess: 6.9 mmol/L — ABNORMAL HIGH (ref 0.0–2.0)
Bicarbonate: 32.5 mmol/L — ABNORMAL HIGH (ref 20.0–28.0)
Drawn by: 26221
O2 Saturation: 64.3 %
Patient temperature: 36.7
pCO2, Ven: 48 mmHg (ref 44–60)
pH, Ven: 7.43 (ref 7.25–7.43)
pO2, Ven: 32 mmHg (ref 32–45)

## 2024-11-21 LAB — CBG MONITORING, ED
Glucose-Capillary: 560 mg/dL (ref 70–99)
Glucose-Capillary: 490 mg/dL — ABNORMAL HIGH (ref 70–99)

## 2024-11-21 LAB — BETA-HYDROXYBUTYRIC ACID: Beta-Hydroxybutyric Acid: 0.22 mmol/L (ref 0.05–0.27)

## 2024-11-21 MED ORDER — INSULIN ASPART 100 UNIT/ML IV SOLN
10.0000 [IU] | Freq: Once | INTRAVENOUS | Status: AC
  Administered 2024-11-21: 10 [IU] via INTRAVENOUS
  Filled 2024-11-21: qty 1

## 2024-11-21 MED ORDER — LACTATED RINGERS IV BOLUS
20.0000 mL/kg | Freq: Once | INTRAVENOUS | Status: AC
  Administered 2024-11-21: 1516 mL via INTRAVENOUS

## 2024-11-21 MED ORDER — INSULIN ASPART 100 UNIT/ML IV SOLN
10.0000 [IU] | Freq: Once | INTRAVENOUS | Status: AC
  Administered 2024-11-22: 10 [IU] via INTRAVENOUS
  Filled 2024-11-21: qty 1

## 2024-11-21 NOTE — ED Notes (Signed)
 Call to lab to add orders to blood previously sent

## 2024-11-21 NOTE — ED Triage Notes (Signed)
 Pt arrives by [facility] due to hyperglycemia.  Pt was out of her diabetes and BP medication for a week due to lack of financial ability to get the medication.  She began taking it again today around 5pm.  Pt has been feeling like her head was swimmy' and she checked her CBG and it was high and her BP was also elevated.  Pt had 250ml NS from ems.  Pt was given one sl nitroglycerin  to lower BP from 180's to 150's for ems.

## 2024-11-21 NOTE — ED Provider Notes (Signed)
 [place] EMERGENCY DEPARTMENT AT [facility] Provider Note   CSN: [id number] Arrival date & time: 11/21/24  2051     Patient presents with: Hyperglycemia   Amanda Mendez is a 68 y.o. female.  History of diabetes presents today with hyperglycemia.  She has a history of diabetes and has been out of her medications for the past week.  She felt dizzy and had blurry vision. patient checked her glucose level at home and it was undetectable.  She used to take Lantus  insulin  and glimepiride .  Denies numbness tingling weakness syncope. Denies falls. PCP working on getting her switched to Jardiance .       Hyperglycemia Associated symptoms: dizziness        Prior to Admission medications  Medication Sig Start Date End Date Taking? Authorizing Provider  amLODipine  (NORVASC ) 5 MG tablet Take 1 tablet (5 mg total) by mouth daily. 11/03/24   Jerrell Cleatus Ned, MD  aspirin  EC 81 MG tablet Take 81 mg by mouth daily. Swallow whole.    [provider]  carvedilol  (COREG ) 6.25 MG tablet Take 1 tablet (6.25 mg total) by mouth 2 (two) times daily with a meal. 09/07/24   Jerrell Cleatus Ned, MD  DULoxetine  (CYMBALTA ) 30 MG capsule Take 30 mg by mouth daily.    [provider]  LANTUS  SOLOSTAR 100 UNIT/ML Solostar Pen Inject 40 Units into the skin daily. 09/07/24   Jerrell Cleatus Ned, MD  losartan -hydrochlorothiazide  (HYZAAR) 100-25 MG tablet Take 1 tablet by mouth daily. 10/05/24   Jerrell Cleatus Ned, MD  rosuvastatin  (CRESTOR ) 40 MG tablet Take 1 tablet (40 mg total) by mouth every evening. 09/07/24   Jerrell Cleatus Ned, MD    Allergies: Farxiga [dapagliflozin], Codeine, and Invokana [canagliflozin]    Review of Systems  Eyes:  Positive for visual disturbance.  Neurological:  Positive for dizziness.    Updated Vital Signs BP (!) 159/87   Pulse 86   Temp 98.3 F (36.8 C) (Oral)   Resp (!) 26   Ht 5' 1 (1.549 m)   Wt 75.8 kg   SpO2 93%   BMI  31.55 kg/m   Physical Exam Vitals and nursing note reviewed.  Constitutional:      General: She is not in acute distress.    Appearance: She is well-developed.  HENT:     Head: Normocephalic and atraumatic.  Eyes:     Extraocular Movements: Extraocular movements intact.     Conjunctiva/sclera: Conjunctivae normal.  Cardiovascular:     Rate and Rhythm: Normal rate and regular rhythm.     Heart sounds: Normal heart sounds. No murmur heard. Pulmonary:     Effort: Pulmonary effort is normal. No respiratory distress.     Breath sounds: Normal breath sounds. No decreased breath sounds, wheezing, rhonchi or rales.  Abdominal:     Palpations: Abdomen is soft.     Tenderness: There is no abdominal tenderness.  Musculoskeletal:        General: No swelling.     Cervical back: Neck supple.  Skin:    General: Skin is warm and dry.     Capillary Refill: Capillary refill takes less than 2 seconds.  Neurological:     General: No focal deficit present.     Mental Status: She is alert and oriented to person, place, and time.     Cranial Nerves: Cranial nerves 2-12 are intact.     Sensory: Sensation is intact.     Motor: Motor function is intact.  No weakness.     Deep Tendon Reflexes: Reflexes are normal and symmetric.  Psychiatric:        Mood and Affect: Mood normal.     (all labs ordered are listed, but only abnormal results are displayed) Labs Reviewed  CBC - Abnormal; Notable for the following components:      Result Value   WBC 11.7 (*)    All other components within normal limits  URINALYSIS, ROUTINE W REFLEX MICROSCOPIC - Abnormal; Notable for the following components:   Color, Urine COLORLESS (*)    Glucose, UA >=500 (*)    Hgb urine dipstick SMALL (*)    Protein, ur 30 (*)    All other components within normal limits  BASIC METABOLIC PANEL WITH GFR - Abnormal; Notable for the following components:   Sodium 133 (*)    Chloride 90 (*)    Glucose, Bld 569 (*)    BUN 37 (*)     Creatinine, Ser 1.81 (*)    GFR, Estimated 30 (*)    All other components within normal limits  BLOOD GAS, VENOUS - Abnormal; Notable for the following components:   Bicarbonate 32.5 (*)    Acid-Base Excess 6.9 (*)    All other components within normal limits  CBG MONITORING, ED - Abnormal; Notable for the following components:   Glucose-Capillary 560 (*)    All other components within normal limits  I-STAT CHEM 8, ED - Abnormal; Notable for the following components:   Sodium 132 (*)    Chloride 93 (*)    BUN 46 (*)    Creatinine, Ser 1.80 (*)    Glucose, Bld 573 (*)    Calcium , Ion 1.07 (*)    Hemoglobin 15.3 (*)    All other components within normal limits  CBG MONITORING, ED - Abnormal; Notable for the following components:   Glucose-Capillary 490 (*)    All other components within normal limits  BETA-HYDROXYBUTYRIC ACID  BASIC METABOLIC PANEL WITH GFR  BASIC METABOLIC PANEL WITH GFR  BASIC METABOLIC PANEL WITH GFR  BETA-HYDROXYBUTYRIC ACID  BETA-HYDROXYBUTYRIC ACID  BETA-HYDROXYBUTYRIC ACID  BASIC METABOLIC PANEL WITH GFR  BETA-HYDROXYBUTYRIC ACID    EKG: None  Radiology: DG Chest Portable 1 View Result Date: 11/21/2024 EXAM: 1 VIEW(S) XRAY OF THE CHEST 11/21/2024 11:33:51 PM COMPARISON: 11/14/2022 CLINICAL HISTORY: tachypnea FINDINGS: LUNGS AND PLEURA: No focal pulmonary opacity. No pleural effusion. No pneumothorax. HEART AND MEDIASTINUM: Postoperative changes in mediastinum. BONES AND SOFT TISSUES: Postoperative changes with sternal wires. Right proximal humeral fracture of uncertain chronicity. IMPRESSION: 1. No acute cardiopulmonary process. 2. Right proximal humeral fracture of uncertain chronicity. Electronically signed by: Oneil Devonshire MD 11/21/2024 11:39 PM EST RP Workstation: [id number]     Procedures   Medications Ordered in the ED  lactated ringers  bolus 1,516 mL (0 mLs Intravenous Stopped 11/21/24 2306)  insulin  aspart (novoLOG ) injection 10 Units  (10 Units Intravenous Given 11/21/24 2258)    Clinical Course as of 11/21/24 2348  Sat Nov 21, 2024  2313 Acid-Base Excess(!): 6.9 [AL]  2339 Acid-Base Excess(!): 6.9 [AL]    Clinical Course User Index [AL] Braxton Dubois, PA-C                                 Medical Decision Making Amount and/or Complexity of Data Reviewed Labs: ordered.    This patient presents to the ED for concern of high blood sugar and dizziness,  this involves an extensive number of treatment options, and is a complaint that carries with it a high risk of complications and morbidity.  History of type 2 diabetes but has been out of her medications for the past week. the differential diagnosis includes DKA, HHS, sepsis, ketoacidosis, metabolic alkalosis.     Additional history obtained:  Additional history obtained from EMR External records from outside source obtained and reviewed including office notes from 11/03/2024 where she was recommended to discontinue   Lab Tests:  I Ordered, and personally interpreted labs.  The pertinent results include:  hyperglycemia at 569, elevated bicarb at 32.5, high acid base at 6.9.    Imaging Studies ordered:  I ordered imaging studies including chest x-ray I independently visualized and interpreted imaging which showed no PNE.  I agree with the radiologist interpretation   Cardiac Monitoring: / EKG:  The patient was maintained on a cardiac monitor.  I personally viewed and interpreted the cardiac monitored which showed an underlying rhythm of: Normal sinus   Problem List / ED Course / Critical interventions / Medication management  DKA  I ordered medication including lactated ringers  and insulin  aspart    Reevaluation of the patient after these medicines showed that the patient improved blood pressure, heart rate and glucose level.  I have reviewed the patients home medicines and have made adjustments as needed   Accepted handoff at shift change from Alm Lias MD. Please see prior provider note for more detail.    Briefly: Patient is 68 y.o. with a history of diabetes type 2 presents today with hyperglycemia.  Has been out of her medications for a week.   DDX: concern for DKA  Plan: fluids, decrease glucose level and discharge if stable.        Final diagnoses:  None    ED Discharge Orders     None          Braxton Dubois, PA-C 11/21/24 2359    Cleotilde Rogue, MD 11/22/24 2226

## 2024-11-21 NOTE — ED Notes (Signed)
 Pt voided to provide urine sample and missed urine collection hat.  Will help pt attempt to collect urine again in a little while.

## 2024-11-21 NOTE — ED Provider Notes (Incomplete)
 Care assumed from Lal Aanchal PA-C, patient with hyperglycemia. Chest x-ray is pending. Can probably  be discharged when glucose is down to a reasonable level. She had been non-compliant with medication.

## 2024-11-22 LAB — BASIC METABOLIC PANEL WITH GFR
Anion gap: 5 (ref 5–15)
BUN: 32 mg/dL — ABNORMAL HIGH (ref 8–23)
CO2: 36 mmol/L — ABNORMAL HIGH (ref 22–32)
Calcium: 10.1 mg/dL (ref 8.9–10.3)
Chloride: 98 mmol/L (ref 98–111)
Creatinine, Ser: 1.6 mg/dL — ABNORMAL HIGH (ref 0.44–1.00)
GFR, Estimated: 35 mL/min — ABNORMAL LOW (ref >60)
Glucose, Bld: 167 mg/dL — ABNORMAL HIGH (ref 70–99)
Potassium: 4.1 mmol/L (ref 3.5–5.1)
Sodium: 139 mmol/L (ref 135–145)

## 2024-11-22 LAB — CBG MONITORING, ED: Glucose-Capillary: 276 mg/dL — ABNORMAL HIGH (ref 70–99)

## 2024-11-22 LAB — BETA-HYDROXYBUTYRIC ACID: Beta-Hydroxybutyric Acid: 0.12 mmol/L (ref 0.05–0.27)

## 2024-11-22 MED ORDER — METFORMIN HCL 500 MG PO TABS
500.0000 mg | ORAL_TABLET | Freq: Once | ORAL | Status: DC
  Filled 2024-11-22: qty 1

## 2024-11-22 MED ORDER — METFORMIN HCL 500 MG PO TABS: 500.0000 mg | ORAL_TABLET | Freq: Two times a day (BID) | ORAL | 0 refills | Status: DC

## 2024-11-22 MED ORDER — GLIPIZIDE 5 MG PO TABS
5.0000 mg | ORAL_TABLET | Freq: Once | ORAL | Status: AC
  Administered 2024-11-22: 5 mg via ORAL
  Filled 2024-11-22: qty 1

## 2024-11-22 MED ORDER — GLIPIZIDE 10 MG PO TABS: 10.0000 mg | ORAL_TABLET | Freq: Every day | ORAL | 0 refills | Status: DC

## 2024-11-22 NOTE — Discharge Instructions (Addendum)
 Please take the glipizide  until you are restarted on your insulin .  In the meantime, make sure to monitor your blood sugar.  Come back to the emergency department if blood sugar is getting too high.

## 2024-11-24 ENCOUNTER — Telehealth: Payer: Self-pay | Admitting: Pharmacist

## 2024-11-24 NOTE — Telephone Encounter (Signed)
 Patient states she was given glipizide  rx and also metformin  in the ED. But ED notes state that only glipizide  was prescribed.  Patient's last eGFR was 27 - metformin  has been used with eGFR < 30 but recommended to check plasma lactate and discontinue if > 3.36mmol/L. Manufacturer recommends not using if eGFR if > 30.   Recommended patient hold metformin  for now until we can recheck her labs.   She has restarted Lantus  - she had missed 2 days of Lantus  prior to ED visit because she ran out of pen needles.  Blood glucose has been 150's to 250.   Lab Results  Component Value Date   HGBA1C 10.8 (A) 09/07/2024   Asked patient again about using Continuous Glucose Monitor. She states she would be he insurance would not cover. I think her [facility] plan would cover if ordered thru [facility].  Will consult with PCP and send in Rx for her.   Follow up planned 11/26/2024

## 2024-11-25 MED ORDER — FREESTYLE LIBRE 3 READER DEVI: 0 refills | Status: AC

## 2024-11-25 NOTE — Telephone Encounter (Signed)
 Called [employer] to follow up [place] 3 sensor and reader cost. Per [employer] both are covered at $0 cost to patient. They just need patient to call to verify her address so they can ship the sensors and reader to her. Their customer service number is [phone]  I tried to call patient but no answer and Mail box was full - unable to LM.  I did reach out to [facility] and LM with above information and also my CB# [phone] if she has any questions.

## 2024-11-25 NOTE — Telephone Encounter (Signed)
 CGM supplies ordered.

## 2024-11-26 ENCOUNTER — Other Ambulatory Visit: Payer: Self-pay | Admitting: Pharmacist

## 2024-11-26 DIAGNOSIS — E119 Type 2 diabetes mellitus without complications: Secondary | ICD-10-CM

## 2024-11-26 NOTE — Progress Notes (Signed)
 11/26/2024 Name: Amanda Mendez MRN: [id number] DOB: March 15, 1956  Chief Complaint  Patient presents with   Diabetes    Amanda Mendez is a 68 y.o. year old female who was referred for medication management by their primary care provider, Amanda Cleatus Ned, MD. They presented for a phone visit today They were referred to the pharmacist by their PCP for assistance in managing diabetes and medication access    Subjective:  Patient was seen in ED for DKA. Prior to ED visit she has not taken Lantus  for 2 or 3 days because she was out of pen needles.  She received prescriptions for metformin  and glipizide  from ED but she was instructed not to take metformin  due to renal function.   Care Team: Primary Care Provider: Jerrell Cleatus Ned, MD ; Next Scheduled Visit: 12/08/2024   Diabetes:  Current medications: Lantus  40 units daily, glipizide  10mg  once each morning  Checking blood glucose 1 or 2 times per day. Blood glucose has been in 200's to low 300's since ED visit.   Medications tried in the past: Invokana - stopped due to UTI; Farxiga - itching  Cost has been a barrier to starting GLP1 agents - for 2025 $255 deductible to meet, then all preferred brand medications would be $47  /month. So first fill could be $302 but any after would be $47 /month. Ozempic, Mounjaro, Trulicity (all are tier 3 medications)  For 2026 her tier 3 copayment and deductible is increasing. Tier 3 copay will be 2% of medication cost and deductible would be $355.  We had planned to try to get Jardiance  thru patient assistance program but currently on hold due to recent DKA episode, elevated Scr and until blood glucose lower.   Diet: patient mentions she wakes during the night and will eat a banana as a snack. She denies symptoms of hypoglycemia during this time. She just states she wakes to go to bathroom and feels hungry so she snacks.    Macrovascular and Microvascular Risk Reduction:  Statin? yes  (rosuvastatin  ); ACEi/ARB? yes (losartan  HCT) Last urinary albumin /creatinine ratio:  Lab Results  Component Value Date   MICRALBCREAT 2,011.4 (H) 09/07/2024   Last eye exam:   Last foot exam: No foot exam found Tobacco Use:  Tobacco Use: Medium Risk (11/21/2024)   Patient History    Smoking Tobacco Use: Former    Smokeless Tobacco Use: Never    Passive Exposure: Not on file    BP Readings from Last 3 Encounters:  11/22/24 (!) 157/78  11/03/24 (!) 154/72  10/05/24 (!) 174/70   Per patient recently blood pressure at home since amlodipine  started has been 130 - 140's / 75 to 90  Medication Access/Adherence  Current Pharmacy:  [facility] [address] [place] - [address] [place] [address] Phone: [phone] Fax: [phone]  [id number] - [place], [place] - [address] [place] [address] Phone: [phone] Fax: [phone]  [name] [address] [place] [address] Phone: [phone] Fax: [phone]  [facility] [place], [place] - [address] [place] [address] Phone: [phone] Fax: [phone]     Objective:  Lab Results  Component Value Date   HGBA1C 10.8 (A) 09/07/2024    Lab Results  Component Value Date   CREATININE 1.60 (H) 11/22/2024   BUN 32 (H) 11/22/2024   NA  139 11/22/2024   K 4.1 11/22/2024   CL 98 11/22/2024   CO2 36 (H) 11/22/2024    Lab Results  Component Value Date   CHOL 181 11/03/2024   HDL 53 11/03/2024   LDLCALC 96 11/03/2024   TRIG 231 (H) 11/03/2024   CHOLHDL 3.4 11/03/2024      Assessment/Plan:   Diabetes / Medication Management / Access:  - provided patient customer service number for [facility]. She just needs to verify her address and then they will mail her [employer] sensors and reader. Plan to meet with her in office 12/30 for education.  -  Recommended she increase Lantus  to 45 units daily.  - Reviewed signs and symptoms of hypoglycemia and how to treat.  - Discussed diet. Recommended serving size for banana is 0.5 of banana. Suggested if she snacks in evening to have small snack with protein + carbs like yogurt or 2 or 3 crackers with peanut butter.    Madelin Ray, PharmD Clinical Pharmacist [facility]  Population Health [phone]

## 2024-12-08 ENCOUNTER — Other Ambulatory Visit: Payer: Self-pay | Admitting: Pharmacist

## 2024-12-08 ENCOUNTER — Ambulatory Visit: Admitting: Student in an Organized Health Care Education/Training Program

## 2024-12-08 ENCOUNTER — Encounter: Payer: Self-pay | Admitting: Student in an Organized Health Care Education/Training Program

## 2024-12-08 VITALS — BP 125/80 | HR 96 | Wt 177.0 lb

## 2024-12-08 DIAGNOSIS — Z794 Long term (current) use of insulin: Secondary | ICD-10-CM | POA: Diagnosis not present

## 2024-12-08 DIAGNOSIS — N184 Chronic kidney disease, stage 4 (severe): Secondary | ICD-10-CM | POA: Diagnosis not present

## 2024-12-08 DIAGNOSIS — E1159 Type 2 diabetes mellitus with other circulatory complications: Secondary | ICD-10-CM | POA: Diagnosis not present

## 2024-12-08 DIAGNOSIS — I152 Hypertension secondary to endocrine disorders: Secondary | ICD-10-CM | POA: Diagnosis not present

## 2024-12-08 DIAGNOSIS — R5381 Other malaise: Secondary | ICD-10-CM | POA: Diagnosis not present

## 2024-12-08 DIAGNOSIS — E1122 Type 2 diabetes mellitus with diabetic chronic kidney disease: Secondary | ICD-10-CM

## 2024-12-08 LAB — POCT GLYCOSYLATED HEMOGLOBIN (HGB A1C): Hemoglobin A1C: 12.3 % — AB (ref 4.0–5.6)

## 2024-12-08 MED ORDER — INSULIN LISPRO (1 UNIT DIAL) 100 UNIT/ML (KWIKPEN): 10.0000 [IU] | PEN_INJECTOR | Freq: Two times a day (BID) | SUBCUTANEOUS | 2 refills | Status: AC

## 2024-12-08 NOTE — Assessment & Plan Note (Signed)
 Her blood pressure is elevated in the office but well-controlled at home. Continue the current antihypertensive regimen and monitor blood pressure at home.  Using Hyzaar, but seems to be having some issues keeping this medication perhaps because of the cost of refills.

## 2024-12-08 NOTE — Progress Notes (Signed)
 "  Established Patient Office Visit  Patient ID: Amanda Mendez, female    DOB: Nov 08, 1956  Age: 68 y.o. MRN: [id number] PCP: Jerrell Cleatus Ned, MD  Chief Complaint  Patient presents with   Diabetes    5 week follow up     Subjective:     HPI  Discussed the use of AI scribe software for clinical note transcription with the patient, who gave verbal consent to proceed.  History of Present Illness Amanda Mendez is a 68 year old female with diabetes who presents with a recent hyperglycemic episode.  She experienced a significant hyperglycemic episode on December 13th, leading to an emergency department visit. Her blood sugar levels were extremely high, necessitating multiple insulin  shots and four bags of IV fluids. She was discharged the same night without changes to her medication regimen.  The hyperglycemic episode was attributed to a lapse in insulin  use due to financial constraints, as she was without insulin  for three days. She has since obtained her insulin . Her current diabetes management includes Lantus  at 40 units daily and glipizide . She has not noticed any hypoglycemic episodes with glipizide . She is awaiting the results of an application for Farxiga. She has been equipped with a glucose monitor linked to her phone to better track her blood sugar levels.  Her A1c has increased from 10% in September to 12% currently. She reports eating two meals and a snack daily. She has a supply of Lantus  at home and is using it consistently.  Her blood pressure at home has been stable, averaging 125/80 to 135/80, although it was elevated during the visit, possibly due to recent dietary choices high in sodium.  She has been unable to attend physical therapy due to transportation issues but has been trying to stay active by walking on her deck when weather permits.    Objective:     BP 125/80   Pulse 96   SpO2 98%   Physical Exam  Gen: Frail appearing older woman Neuro: Alert,  conversational, very slow get up and go, unsteady gait, one-person assistance to get up to the table, full strength upper and lower extremities Heart: Regular, no murmur Lungs; unlabored, clear throughout Ext: Warm, no edema   Results for orders placed or performed in visit on 12/08/24  POCT glycosylated hemoglobin (Hb A1C)  Result Value Ref Range   Hemoglobin A1C 12.3 (A) 4.0 - 5.6 %   HbA1c POC (<> result, manual entry)     HbA1c, POC (prediabetic range)     HbA1c, POC (controlled diabetic range)        Assessment & Plan:   Problem List Items Addressed This Visit       High   Type 2 diabetes mellitus with diabetic chronic kidney disease (HCC) - Primary (Chronic)   Her recent hyperglycemic episode and increased A1c to 12% indicate poor glycemic control. She is currently on Lantus  and awaiting Farxiga approval. There is a potential need for mealtime insulin . Continue Lantus  40 units subcutaneously daily and initiate Humalog Pen, 10 units with meals, twice daily. Await Farxiga approval from pharmaceutical assistance program. Monitor blood glucose levels using a continuous glucose monitor and review data in one week to assess the need for mealtime insulin  adjustments. She was educated on the timing of insulin  administration with meals and advised on dietary modifications, including reducing high-sugar fruits like bananas.      Relevant Medications   insulin  lispro (HUMALOG KWIKPEN) 100 UNIT/ML KwikPen   Other Relevant Orders  POCT glycosylated hemoglobin (Hb A1C) (Completed)   Hypertension associated with diabetes (HCC) (Chronic)   Her blood pressure is elevated in the office but well-controlled at home. Continue the current antihypertensive regimen and monitor blood pressure at home.  Using Hyzaar, but seems to be having some issues keeping this medication perhaps because of the cost of refills.      Relevant Medications   insulin  lispro (HUMALOG KWIKPEN) 100 UNIT/ML KwikPen    Physical deconditioning (Chronic)   Patient has progressive and worsening deconditioning and declining functional state.  Unbalanced ambulation today in the clinic in need of one-person assistance to get onto the exam table.  I referred her to outpatient physical therapy in September, but unfortunately she was unable to go because getting out of the house is so difficult for her.  She has balance issues which makes it difficult for her to ambulate.  We decided to try home health physical therapy, she is motivated and wants to work on her strengthening and reduce her fall risk.      Relevant Orders   Ambulatory referral to Home Health    Return in about 4 weeks (around 01/05/2025) for DM management.    Cleatus Debby Specking, MD [facility]   "

## 2024-12-08 NOTE — Patient Instructions (Signed)
" °  VISIT SUMMARY: During your visit, we discussed your recent hyperglycemic episode and reviewed your diabetes management plan. We also addressed your blood pressure and mobility issues.  YOUR PLAN: -TYPE 2 DIABETES MELLITUS WITH DIABETIC CHRONIC KIDNEY DISEASE: Type 2 diabetes is a condition where your body does not use insulin  properly, leading to high blood sugar levels. Your recent hyperglycemic episode and increased A1c indicate poor blood sugar control. Continue taking Lantus  at 40 units daily and start using Humalog QuickPen, 10 units with meals, twice daily. We are waiting for the approval of Farxiga. Use your continuous glucose monitor to track your blood sugar levels and we will review the data in one week to see if any adjustments are needed. Please remember to take your insulin  with meals and try to reduce high-sugar fruits like bananas in your diet.  -HYPERTENSION ASSOCIATED WITH DIABETES: Hypertension, or high blood pressure, is often associated with diabetes. Your blood pressure is well-controlled at home but was elevated during the visit. Continue your current blood pressure medications and keep monitoring your blood pressure at home.  -DECONDITIONING: Deconditioning refers to the loss of physical fitness due to inactivity. You have had limited mobility because of transportation issues for physical therapy. We will start home health physical therapy once a week for 6-8 weeks. You were also given exercises to help improve your mobility and balance.  INSTRUCTIONS: Please follow up in one week to review your blood glucose data from the continuous glucose monitor. Continue monitoring your blood pressure at home and keep track of your physical activity. If you experience any issues or have questions, do not hesitate to contact our office.  "

## 2024-12-08 NOTE — Progress Notes (Signed)
 "  12/08/2024 Name: Amanda Mendez MRN: [id number] DOB: [birth year]  Chief Complaint  Patient presents with   Diabetes    Amanda Mendez is a 68 y.o. year old female who was referred for medication management by their primary care provider, Jerrell Cleatus Ned, MD. They presented for an office visit today to start using Libre 3 plus Continuous Glucose Monitor system. Patient will see PCP after our appointment They were referred to the pharmacist by their PCP for assistance in managing diabetes and medication access    Subjective:  Patient was seen in ED 11/21/2024 for DKA. Prior to ED visit she had not taken Lantus  for 2 or 3 days because she was out of pen needles.   Care Team: Primary Care Provider: Jerrell Cleatus Ned, MD ; Next Scheduled Visit: 12/08/2024 - today   Diabetes:  Current medications: Lantus  45 units daily, glipizide  10mg  once each morning  Medications tried in the past: Invokana - stopped due to UTI; Farxiga - itching  Cost has been a barrier to starting GLP1 agents - for 2025 $255 deductible to meet, then all preferred brand medications would be $47  /month. So first fill could be $302 but any after would be $47 /month. Ozempic, Mounjaro, Trulicity (all are tier 3 medications)  For 2026 her tier 3 copayment and deductible is increasing. Tier 3 copay will be 22% of medication cost and deductible would be $355.  We had planned to try to get Jardiance  thru patient assistance program but currently on hold due to recent DKA episode, elevated Scr and until blood glucose is lower.    Macrovascular and Microvascular Risk Reduction:  Statin? yes (rosuvastatin  ); ACEi/ARB? yes (losartan  HCT) Last urinary albumin /creatinine ratio:  Lab Results  Component Value Date   MICRALBCREAT 2,011.4 (H) 09/07/2024   Last eye exam:   Last foot exam: No foot exam found Tobacco Use:  Tobacco Use: Medium Risk (11/21/2024)   Patient History    Smoking Tobacco Use: Former     Smokeless Tobacco Use: Never    Passive Exposure: Not on file    BP Readings from Last 3 Encounters:  12/08/24 (!) 194/75  11/22/24 (!) 157/78  11/03/24 (!) 154/72   Medication Access/Adherence  Current Pharmacy:  [facility] [address] [place] - [address] [place] [address] Phone: [phone] Fax: [phone]  [id number] [place] [place] - [address] [place] [address] Phone: [phone] Fax: [phone]  [name] [address] [place] [address] Phone: [phone] Fax: [phone]  [facility] - [place], [place] - [address] [place] [address] Phone: [phone] Fax: [phone]     Objective:  Lab Results  Component Value Date   HGBA1C 10.8 (A) 09/07/2024    Lab Results  Component Value Date   CREATININE 1.60 (H) 11/22/2024   BUN 32 (H) 11/22/2024   NA 139 11/22/2024   K 4.1 11/22/2024   CL 98 11/22/2024   CO2 36 (H) 11/22/2024    Lab Results  Component Value Date   CHOL 181 11/03/2024   HDL 53 11/03/2024   LDLCALC 96 11/03/2024   TRIG 231 (H) 11/03/2024   CHOLHDL 3.4 11/03/2024      Assessment/Plan:   Diabetes / Medication Management / Access:  - Reviewed signs and symptoms of hypoglycemia and how to treat.  - Patient received the  following instruction for Freestyle Libre 3 Plus Sensor and Continuous Glucose Monitor system: :  - preparation of placement site - clean with alcohol and allow to dry.  Sensor is to only be place on back of upper arm.  Patient to rotate sides and site.   -care of sensor and site   - reminded that sensor is waterproof up to 3 feet and for 30 minutes.   - Assisted in downloading [place] 3 app to her phone and with app / account set up.   - reviewed Libre 3 app home screen and how to read and respond to trend arrows.   - reminded that when  magnifying glass symbols shows up she is to confirm BG with finger stick before making any treatment decisions. .   - pt signed up for libre view in office. Linked with PCP office.    Madelin Ray, PharmD Clinical Pharmacist [facility]  Population Health [phone]      "

## 2024-12-08 NOTE — Assessment & Plan Note (Signed)
 Her recent hyperglycemic episode and increased A1c to 12% indicate poor glycemic control. She is currently on Lantus  and awaiting Farxiga approval. There is a potential need for mealtime insulin . Continue Lantus  40 units subcutaneously daily and initiate Humalog Pen, 10 units with meals, twice daily. Await Farxiga approval from pharmaceutical assistance program. Monitor blood glucose levels using a continuous glucose monitor and review data in one week to assess the need for mealtime insulin  adjustments. She was educated on the timing of insulin  administration with meals and advised on dietary modifications, including reducing high-sugar fruits like bananas.

## 2024-12-08 NOTE — Assessment & Plan Note (Signed)
 Patient has progressive and worsening deconditioning and declining functional state.  Unbalanced ambulation today in the clinic in need of one-person assistance to get onto the exam table.  I referred her to outpatient physical therapy in September, but unfortunately she was unable to go because getting out of the house is so difficult for her.  She has balance issues which makes it difficult for her to ambulate.  We decided to try home health physical therapy, she is motivated and wants to work on her strengthening and reduce her fall risk.

## 2024-12-11 ENCOUNTER — Telehealth: Payer: Self-pay | Admitting: Student in an Organized Health Care Education/Training Program

## 2024-12-11 NOTE — Telephone Encounter (Signed)
 Copied from CRM [id number]. Topic: Clinical - Prescription Issue >> Dec 11, 2024  3:52 PM Alfonso ORN wrote: Reason for CRM: fast acting insulin  will cost pt $75 and pt unable to pay. please call to advise [phone]

## 2024-12-11 NOTE — Telephone Encounter (Signed)
 Insulin  is too expensive for patient. She is looking for an alterative

## 2024-12-14 NOTE — Telephone Encounter (Signed)
 At the current prescribe dose of 10 units twice a day, 1 box of insulin  pens which is 15 mL will last her for 75 days. The $25 copay is per each 30 days so she is getting charged $25 x 3.  Unfortunately at this dose it does not matter which insulin  she uses, it will be $75 for any day supply > 60 day supply.  $25 - 0 to 30 day supply $50 - 31 to 60 day supply $75 - > 60 day supply  [facility] and most pharmacies are not able to dispense the pens separately. I think the [facility] in [place] or at [facility] will break a box and just dispense 2 pens to her which would be $25 / month.   Tried to call patient to see if Ok to switch to [facility] (they can also mail to her if needed) but had to LM on VM.

## 2024-12-15 ENCOUNTER — Telehealth: Payer: Self-pay | Admitting: Pharmacist

## 2024-12-15 ENCOUNTER — Other Ambulatory Visit: Payer: Self-pay | Admitting: Pharmacist

## 2024-12-15 NOTE — Telephone Encounter (Signed)
 Second unsuccessful out reach to patient to assist with insulin  cost and follow up diabetes.

## 2024-12-15 NOTE — Telephone Encounter (Signed)
 Attempt was made to contact patient by phone today for follow up by Clinical Pharmacist regarding insulin  cost and follow up diabetes.  Unable to reach patient. LM on VM with my contact number [phone] or [phone].

## 2024-12-21 ENCOUNTER — Other Ambulatory Visit: Payer: Self-pay | Admitting: Student in an Organized Health Care Education/Training Program

## 2024-12-21 DIAGNOSIS — E118 Type 2 diabetes mellitus with unspecified complications: Secondary | ICD-10-CM | POA: Insufficient documentation

## 2024-12-21 DIAGNOSIS — I2510 Atherosclerotic heart disease of native coronary artery without angina pectoris: Secondary | ICD-10-CM | POA: Insufficient documentation

## 2024-12-21 NOTE — Telephone Encounter (Unsigned)
 Copied from CRM [id number]. Topic: Clinical - Medication Refill >> Dec 21, 2024  4:04 PM Thersia C wrote: Medication: DULoxetine  (CYMBALTA ) 30 MG capsule  metoprolol  tartrate (LOPRESSOR ) tablet 12.5 mg  glipiZIDE  (GLUCOTROL ) 10 MG tablet Patient stated she has been out for a couple of days   Has the patient contacted their pharmacy? Yes (Agent: If no, request that the patient contact the pharmacy for the refill. If patient does not wish to contact the pharmacy document the reason why and proceed with request.) (Agent: If yes, when and what did the pharmacy advise?)  This is the patient's preferred pharmacy:  [facility] [address], [place] - [address] [place] [address] Phone: [phone] Fax: [phone]  [id number] [place], [place] - [address] [place] [address] Phone: [phone] Fax: [phone]  [name] [address] [place] [address] Phone: [phone] Fax: [phone]  [facility] - [place], [place] - [address] [place] [address] Phone: [phone] Fax: [phone]  Is this the correct pharmacy for this prescription? Yes If no, delete pharmacy and type the correct one.   Has the prescription been filled recently? No  Is the patient out of the medication? Yes  Has the patient been seen for an appointment in the last year OR does the patient have an upcoming appointment? Yes  Can we respond through MyChart? Yes  Agent: Please be advised that Rx refills may take up to 3 business days. We ask that you follow-up with your pharmacy.

## 2024-12-21 NOTE — Telephone Encounter (Signed)
 Attempted to contact patient to verify pharmacy and medications requested as she has requested medications that are not on her profile, lopressor  and glucotrol . Left Voice Mail for patient to return call to [phone]

## 2024-12-21 NOTE — Progress Notes (Unsigned)
 " Cardiology Office Note:   Date:  12/23/2024  ID:  Amanda Mendez, DOB [birth year], MRN [id number] PCP: Jerrell Cleatus Ned, MD  [facility] Providers Cardiologist:  Lynwood Schilling, MD {  History of Present Illness:   Amanda Mendez is a 69 y.o. female who presents for evaluation of coronary artery disease and history of difficult to control hypertension.   She has had coronary artery disease in the distant past.  She has had a history of chronic diastolic dysfunction.  Last echocardiogram in 2015 suggested moderate left ventricular hypertrophy but well-preserved ejection fraction.  There is a somewhat suboptimal study.    She had SOB when I saw her late I 2023.  She was found to have high grade disease and is status post CABG.    The patient denies any new symptoms such as chest discomfort, neck or arm discomfort. There has been no new shortness of breath, PND or orthopnea. There have been no reported palpitations, presyncope or syncope.  Her blood sugar has not been well-controlled.  Her lipids have not been at target.  She was not compliant with her lipid medications.  She started now recently taking the 40 mg of Crestor  every day as was prescribed.  She is now having adjustments to her blood sugar regimen with a hemoglobin A1c that went up to 12.3.  She has tried to change her diet.  ROS: Positive for balance issues.  Otherwise stated in the HPI and negative for all other systems.  Studies Reviewed:    EKG:   EKG Interpretation Date/Time:  Wednesday December 23 2024 11:31:42 EST Ventricular Rate:  80 PR Interval:  170 QRS Duration:  78 QT Interval:  390 QTC Calculation: 449 R Axis:   0  Text Interpretation: Normal sinus rhythm Anterior infarct , age undetermined ST & T wave abnormality, consider lateral ischemia When compared with ECG of 13-Oct-2022 07:03, Anterior infarct is now Present ST now depressed in Lateral leads Inverted T waves have replaced nonspecific T wave  abnormality in Lateral leads Confirmed by Schilling Rattan ([id number]) on 12/23/2024 11:38:23 AM    Risk Assessment/Calculations:              Physical Exam:   VS:  BP 135/72 (BP Location: Left Arm, Patient Position: Sitting, Cuff Size: Normal)   Pulse 77   Resp 16   Ht 5' 1 (1.549 m)   Wt 174 lb (78.9 kg)   SpO2 99%   BMI 32.88 kg/m    Wt Readings from Last 3 Encounters:  12/23/24 174 lb (78.9 kg)  12/08/24 177 lb (80.3 kg)  11/21/24 167 lb (75.8 kg)     GEN: Well nourished, well developed in no acute distress NECK: No JVD; No carotid bruits CARDIAC: RRR, no murmurs, rubs, gallops RESPIRATORY:  Clear to auscultation without rales, wheezing or rhonchi  ABDOMEN: Soft, non-tender, non-distended EXTREMITIES:  No edema; No deformity   ASSESSMENT AND PLAN:   CAD - The patient has no new sypmtoms.  No further cardiovascular testing is indicated.  We will continue with aggressive risk reduction and meds as listed.  Diabetes mellitus  - A1c was 12.3 up from 8.5 However, she is being followed closely with recent med adjustments.  I will defer to Jerrell Cleatus Ned, MD   Hypertension - The blood pressure is at target.  No change in therapy.   Hypercholesterolemia with hypertriglyceridemia - LDL was 96.  She is now compliant with her medications and is going to have  this repeated in a couple of months.  Diastolic heart failure - She euvolemic.  No change in therapy.  Seems to     Follow up with me in 1 year  Signed, Lynwood Schilling, MD   "

## 2024-12-21 NOTE — Telephone Encounter (Signed)
 Historical provider okay to send under your name?

## 2024-12-22 MED ORDER — DULOXETINE HCL 30 MG PO CPEP: 30.0000 mg | ORAL_CAPSULE | Freq: Every day | ORAL | 3 refills | Status: AC

## 2024-12-23 ENCOUNTER — Telehealth: Payer: Self-pay

## 2024-12-23 ENCOUNTER — Encounter: Payer: Self-pay | Admitting: Cardiology

## 2024-12-23 ENCOUNTER — Ambulatory Visit: Admitting: Cardiology

## 2024-12-23 VITALS — BP 135/72 | HR 77 | Resp 16 | Ht 61.0 in | Wt 174.0 lb

## 2024-12-23 DIAGNOSIS — I2510 Atherosclerotic heart disease of native coronary artery without angina pectoris: Secondary | ICD-10-CM | POA: Diagnosis not present

## 2024-12-23 DIAGNOSIS — E785 Hyperlipidemia, unspecified: Secondary | ICD-10-CM | POA: Diagnosis not present

## 2024-12-23 DIAGNOSIS — E118 Type 2 diabetes mellitus with unspecified complications: Secondary | ICD-10-CM

## 2024-12-23 DIAGNOSIS — I10 Essential (primary) hypertension: Secondary | ICD-10-CM

## 2024-12-23 DIAGNOSIS — I5032 Chronic diastolic (congestive) heart failure: Secondary | ICD-10-CM

## 2024-12-23 NOTE — Telephone Encounter (Signed)
 Patient is wanting a refill on these medication. Please advise if okay   Copied from CRM #[id number]. Topic: Clinical - Medication Refill >> Dec 23, 2024 12:49 PM Drema MATSU wrote: Patient is calling to get an update on metoprolol  tartrate (LOPRESSOR ) tablet 12.5 mg and  glipiZIDE  (GLUCOTROL ) 10 MG tablet. She stated that she is needing a refill on it.

## 2024-12-23 NOTE — Telephone Encounter (Signed)
 Called patient to relay this message. No answer and was not able to leave voice mail.

## 2024-12-23 NOTE — Telephone Encounter (Signed)
 I dont want the patient using metoprolol , we switched to carvedilol  in Sept. I also dont want her using glipizide  anymore, the risk of hypoglycemia is too high when it is combined with insulin . Thank you.

## 2024-12-23 NOTE — Telephone Encounter (Unsigned)
 Copied from CRM [id number]. Topic: Clinical - Medication Refill >> Dec 21, 2024  4:04 PM Thersia C wrote: Medication: DULoxetine  (CYMBALTA ) 30 MG capsule  metoprolol  tartrate (LOPRESSOR ) tablet 12.5 mg  glipiZIDE  (GLUCOTROL ) 10 MG tablet Patient stated she has been out for a couple of days   Has the patient contacted their pharmacy? Yes (Agent: If no, request that the patient contact the pharmacy for the refill. If patient does not wish to contact the pharmacy document the reason why and proceed with request.) (Agent: If yes, when and what did the pharmacy advise?)  This is the patient's preferred pharmacy:  [facility] [address], [place] - [address] [place] [address] Phone: [phone] Fax: [phone]  [id number] [place],  - [address] [place] [address] Phone: [phone] Fax: [phone]  [name] [address] [place] [address] Phone: [phone] Fax: [phone]  [facility] - [place], [place] - [address] [place] [address] Phone: [phone] Fax: [phone]  Is this the correct pharmacy for this prescription? Yes If no, delete pharmacy and type the correct one.   Has the prescription been filled recently? No  Is the patient out of the medication? Yes  Has the patient been seen for an appointment in the last year OR does the patient have an upcoming appointment? Yes  Can we respond through MyChart? Yes  Agent: Please be advised that Rx refills may take up to 3 business days. We ask that you follow-up with your pharmacy. >> Dec 23, 2024 12:49 PM Drema MATSU wrote: Patient is calling to get an update on metoprolol  tartrate (LOPRESSOR ) tablet 12.5 mg and  glipiZIDE  (GLUCOTROL ) 10 MG tablet. She stated that she is needing a refill on it.

## 2024-12-23 NOTE — Telephone Encounter (Signed)
 Yes.  Thank you.

## 2024-12-23 NOTE — Telephone Encounter (Signed)
 Called patient to let her know this was sent in, no answer, LM

## 2024-12-23 NOTE — Telephone Encounter (Signed)
 Called Amanda Mendez and he verbalized understanding of Verbal orders

## 2024-12-23 NOTE — Telephone Encounter (Signed)
 Okay for verbal  Copied from CRM [id number]. Topic: Clinical - Home Health Verbal Orders >> Dec 22, 2024  5:13 PM Chiquita SQUIBB wrote: Caller/Agency: Medford with [facility] Callback Number: [phone] Service Requested: Physical Therapy Frequency: Start of care for 01/21 Any new concerns about the patient?

## 2024-12-23 NOTE — Patient Instructions (Addendum)

## 2024-12-30 ENCOUNTER — Telehealth: Payer: Self-pay

## 2024-12-30 NOTE — Telephone Encounter (Signed)
 Okay for verbal

## 2024-12-30 NOTE — Telephone Encounter (Signed)
 Okay for verbal?   Copied from CRM [id number]. Topic: Clinical - Home Health Verbal Orders >> Dec 30, 2024  4:04 PM Debby BROCKS wrote: Caller/Agency: Medford Sciara PA from [facility] Callback Number: [phone] Service Requested: Physical Therapy Frequency: once a week for 8 weeks Any new concerns about the patient? No

## 2024-12-31 NOTE — Telephone Encounter (Signed)
 Called Medford back and gave him the verbal okay from Dr. Jerrell

## 2024-12-31 NOTE — Telephone Encounter (Signed)
 Yes.  Thank you.

## 2025-01-01 ENCOUNTER — Ambulatory Visit (HOSPITAL_COMMUNITY)
Admission: RE | Admit: 2025-01-01 | Discharge: 2025-01-01 | Disposition: A | Source: Ambulatory Visit | Attending: Student in an Organized Health Care Education/Training Program

## 2025-01-01 DIAGNOSIS — R1011 Right upper quadrant pain: Secondary | ICD-10-CM | POA: Insufficient documentation

## 2025-01-05 ENCOUNTER — Telehealth: Payer: Self-pay

## 2025-01-05 ENCOUNTER — Ambulatory Visit: Admitting: Student in an Organized Health Care Education/Training Program

## 2025-01-05 NOTE — Telephone Encounter (Signed)
 Copied from CRM [id number]. Topic: Clinical - Medication Question >> Jan 04, 2025  4:45 PM Charolett L wrote: Reason for CRM: Patient stated that the medication listed below are needed but no longer on her medication list. Patient requesting a call back to go over metoprolol  tartrate (LOPRESSOR ) tablet 12.5 mg   glipiZIDE  (GLUCOTROL ) 10 MG tablet  Looked over medication list, this is not on active list last prescribed by Alm Lias, MD on 11/22/2024 for 30 tablets. Should a refill be provided or should patient reach out to Alm Dopp office for assistance with this prescription?

## 2025-01-05 NOTE — Telephone Encounter (Signed)
 LVM for patient to return a call to our office.

## 2025-01-05 NOTE — Telephone Encounter (Signed)
 No, at our last visit I talked to the patient about discontinuing glipizide .  The risk of hypoglycemia is too high when glipizide  is combined with insulin .  I recommend she focus on the other diabetes medications especially the insulin .  I recommended discontinuing metoprolol  as well, I do not think it is needed in her case.

## 2025-01-06 NOTE — Telephone Encounter (Signed)
 Called to discuss with patient, no answer, LM to call back to discuss

## 2025-01-07 NOTE — Telephone Encounter (Unsigned)
 Copied from CRM [id number]. Topic: Clinical - Home Health Verbal Orders >> Jan 07, 2025  3:44 PM Marda MATSU wrote: Caller/Agency: [facility].SABRASABRARea Rushing Number: [phone] if  you need to provide orders call [phone]   Service Requested: Physical Therapy  Frequency:  due to the patient's property conditions,(ice and snow) patient stated it is too   dangerous for anyone to come to her home.    Please advise

## 2025-01-08 NOTE — Telephone Encounter (Signed)
 3rd attempt to reach out to patient to discuss insulin  cost.  Unable to reach patient. LM on VM of home phone.  Also tried to reach her on her cell phone but number was not in service.   Will send request to med assist team to at least mail her applications for Lantus  and Humalog  patient assistance programs.

## 2025-01-08 NOTE — Telephone Encounter (Signed)
 Okay.  Hopefully we can resume home health physical therapy in a week or 2 when the weather improves.  Thank you.

## 2025-01-08 NOTE — Telephone Encounter (Signed)
 Called Amanda Mendez and she said that she will try again next week. She will update us  when she can

## 2025-01-14 ENCOUNTER — Ambulatory Visit: Admitting: Student in an Organized Health Care Education/Training Program

## 2025-01-14 ENCOUNTER — Telehealth: Payer: Self-pay | Admitting: *Deleted

## 2025-01-14 ENCOUNTER — Ambulatory Visit

## 2025-01-14 NOTE — Telephone Encounter (Signed)
 Called to confirm  offered patient an earlier appointment at 12:00 or a video visit.
